# Patient Record
Sex: Female | Born: 1960 | Race: White | Hispanic: No | Smoking: Current every day smoker
Health system: Southern US, Community
[De-identification: ages and names within clinical notes are randomized; demographics above are authoritative.]

## PROBLEM LIST (undated history)

## (undated) DIAGNOSIS — R609 Edema, unspecified: Secondary | ICD-10-CM

## (undated) DIAGNOSIS — R0600 Dyspnea, unspecified: Secondary | ICD-10-CM

## (undated) DIAGNOSIS — M1990 Unspecified osteoarthritis, unspecified site: Secondary | ICD-10-CM

## (undated) DIAGNOSIS — J449 Chronic obstructive pulmonary disease, unspecified: Secondary | ICD-10-CM

## (undated) DIAGNOSIS — K219 Gastro-esophageal reflux disease without esophagitis: Secondary | ICD-10-CM

## (undated) DIAGNOSIS — F32A Depression, unspecified: Secondary | ICD-10-CM

## (undated) DIAGNOSIS — R600 Localized edema: Secondary | ICD-10-CM

## (undated) DIAGNOSIS — F419 Anxiety disorder, unspecified: Secondary | ICD-10-CM

## (undated) DIAGNOSIS — I10 Essential (primary) hypertension: Secondary | ICD-10-CM

## (undated) HISTORY — PX: ABDOMINAL HYSTERECTOMY: SHX81

## (undated) HISTORY — PX: CARPAL TUNNEL RELEASE: SHX101

## (undated) HISTORY — PX: SHOULDER SURGERY: SHX246

---
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
## 1984-08-04 HISTORY — PX: ABDOMINAL HYSTERECTOMY: SHX81

## 2019-11-03 ENCOUNTER — Other Ambulatory Visit (HOSPITAL_COMMUNITY): Payer: Self-pay

## 2019-11-03 ENCOUNTER — Other Ambulatory Visit: Payer: Self-pay

## 2019-11-03 ENCOUNTER — Inpatient Hospital Stay (HOSPITAL_COMMUNITY): Payer: BLUE CROSS/BLUE SHIELD

## 2019-11-03 ENCOUNTER — Encounter (HOSPITAL_COMMUNITY): Payer: Self-pay | Admitting: Emergency Medicine

## 2019-11-03 ENCOUNTER — Inpatient Hospital Stay (HOSPITAL_COMMUNITY)
Admission: EM | Admit: 2019-11-03 | Discharge: 2019-11-07 | DRG: 433 | Disposition: A | Discharge status: Home / Self Care | Payer: BLUE CROSS/BLUE SHIELD | Attending: Internal Medicine | Admitting: Internal Medicine

## 2019-11-03 ENCOUNTER — Emergency Department (HOSPITAL_COMMUNITY): Payer: BLUE CROSS/BLUE SHIELD

## 2019-11-03 DIAGNOSIS — K7030 Alcoholic cirrhosis of liver without ascites: Secondary | ICD-10-CM

## 2019-11-03 DIAGNOSIS — J449 Chronic obstructive pulmonary disease, unspecified: Secondary | ICD-10-CM | POA: Diagnosis present

## 2019-11-03 DIAGNOSIS — Z20822 Contact with and (suspected) exposure to covid-19: Secondary | ICD-10-CM | POA: Diagnosis present

## 2019-11-03 DIAGNOSIS — Z833 Family history of diabetes mellitus: Secondary | ICD-10-CM | POA: Diagnosis not present

## 2019-11-03 DIAGNOSIS — B159 Hepatitis A without hepatic coma: Secondary | ICD-10-CM | POA: Diagnosis present

## 2019-11-03 DIAGNOSIS — R0602 Shortness of breath: Secondary | ICD-10-CM | POA: Diagnosis present

## 2019-11-03 DIAGNOSIS — F17210 Nicotine dependence, cigarettes, uncomplicated: Secondary | ICD-10-CM | POA: Diagnosis not present

## 2019-11-03 DIAGNOSIS — I8510 Secondary esophageal varices without bleeding: Secondary | ICD-10-CM | POA: Diagnosis present

## 2019-11-03 DIAGNOSIS — Z7289 Other problems related to lifestyle: Secondary | ICD-10-CM | POA: Diagnosis not present

## 2019-11-03 DIAGNOSIS — K7010 Alcoholic hepatitis without ascites: Secondary | ICD-10-CM | POA: Diagnosis present

## 2019-11-03 DIAGNOSIS — A084 Viral intestinal infection, unspecified: Secondary | ICD-10-CM | POA: Diagnosis present

## 2019-11-03 DIAGNOSIS — K5900 Constipation, unspecified: Secondary | ICD-10-CM | POA: Diagnosis not present

## 2019-11-03 DIAGNOSIS — L8990 Pressure ulcer of unspecified site, unspecified stage: Secondary | ICD-10-CM | POA: Insufficient documentation

## 2019-11-03 DIAGNOSIS — R7401 Elevation of levels of liver transaminase levels: Secondary | ICD-10-CM

## 2019-11-03 DIAGNOSIS — Z8049 Family history of malignant neoplasm of other genital organs: Secondary | ICD-10-CM

## 2019-11-03 DIAGNOSIS — Z803 Family history of malignant neoplasm of breast: Secondary | ICD-10-CM

## 2019-11-03 DIAGNOSIS — E8770 Fluid overload, unspecified: Secondary | ICD-10-CM | POA: Diagnosis present

## 2019-11-03 DIAGNOSIS — L97429 Non-pressure chronic ulcer of left heel and midfoot with unspecified severity: Secondary | ICD-10-CM | POA: Diagnosis present

## 2019-11-03 DIAGNOSIS — Z79899 Other long term (current) drug therapy: Secondary | ICD-10-CM | POA: Diagnosis not present

## 2019-11-03 DIAGNOSIS — D539 Nutritional anemia, unspecified: Secondary | ICD-10-CM | POA: Diagnosis present

## 2019-11-03 DIAGNOSIS — K7031 Alcoholic cirrhosis of liver with ascites: Secondary | ICD-10-CM | POA: Diagnosis not present

## 2019-11-03 DIAGNOSIS — Z87891 Personal history of nicotine dependence: Secondary | ICD-10-CM

## 2019-11-03 DIAGNOSIS — L989 Disorder of the skin and subcutaneous tissue, unspecified: Secondary | ICD-10-CM | POA: Diagnosis not present

## 2019-11-03 DIAGNOSIS — K7460 Unspecified cirrhosis of liver: Secondary | ICD-10-CM

## 2019-11-03 DIAGNOSIS — I10 Essential (primary) hypertension: Secondary | ICD-10-CM | POA: Diagnosis present

## 2019-11-03 DIAGNOSIS — Z7951 Long term (current) use of inhaled steroids: Secondary | ICD-10-CM

## 2019-11-03 HISTORY — DX: Essential (primary) hypertension: I10

## 2019-11-03 HISTORY — DX: Chronic obstructive pulmonary disease, unspecified: J44.9

## 2019-11-03 LAB — BASIC METABOLIC PANEL
Anion gap: 11 (ref 5–15)
BUN: <5 mg/dL — ABNORMAL LOW (ref 6–20)
CO2: 27 mmol/L (ref 22–32)
Calcium: 8.4 mg/dL — ABNORMAL LOW (ref 8.9–10.3)
Chloride: 103 mmol/L (ref 98–111)
Creatinine, Ser: 0.4 mg/dL — ABNORMAL LOW (ref 0.44–1.00)
GFR calc Af Amer: >60 mL/min (ref >60)
GFR calc non Af Amer: >60 mL/min (ref >60)
Glucose, Bld: 96 mg/dL (ref 70–99)
Potassium: 4.1 mmol/L (ref 3.5–5.1)
Sodium: 141 mmol/L (ref 135–145)

## 2019-11-03 LAB — HEPATIC FUNCTION PANEL
ALT: 29 U/L (ref 0–44)
AST: 63 U/L — ABNORMAL HIGH (ref 15–41)
Albumin: 2.4 g/dL — ABNORMAL LOW (ref 3.5–5.0)
Alkaline Phosphatase: 245 U/L — ABNORMAL HIGH (ref 38–126)
Bilirubin, Direct: 0.4 mg/dL — ABNORMAL HIGH (ref 0.0–0.2)
Indirect Bilirubin: 0.8 mg/dL (ref 0.3–0.9)
Total Bilirubin: 1.2 mg/dL (ref 0.3–1.2)
Total Protein: 5.6 g/dL — ABNORMAL LOW (ref 6.5–8.1)

## 2019-11-03 LAB — CBC
HCT: 36.1 % (ref 36.0–46.0)
Hemoglobin: 11.3 g/dL — ABNORMAL LOW (ref 12.0–15.0)
MCH: 32.6 pg (ref 26.0–34.0)
MCHC: 31.3 g/dL (ref 30.0–36.0)
MCV: 104 fL — ABNORMAL HIGH (ref 80.0–100.0)
Platelets: 413 10*3/uL — ABNORMAL HIGH (ref 150–400)
RBC: 3.47 MIL/uL — ABNORMAL LOW (ref 3.87–5.11)
RDW: 15.8 % — ABNORMAL HIGH (ref 11.5–15.5)
WBC: 9.1 10*3/uL (ref 4.0–10.5)
nRBC: 0 % (ref 0.0–0.2)

## 2019-11-03 LAB — MAGNESIUM: Magnesium: 1.8 mg/dL (ref 1.7–2.4)

## 2019-11-03 LAB — BRAIN NATRIURETIC PEPTIDE: B Natriuretic Peptide: 356.2 pg/mL — ABNORMAL HIGH (ref 0.0–100.0)

## 2019-11-03 LAB — URINALYSIS, COMPLETE (UACMP) WITH MICROSCOPIC
Bacteria, UA: NONE SEEN
Bilirubin Urine: NEGATIVE
Glucose, UA: NEGATIVE mg/dL
Hgb urine dipstick: NEGATIVE
Ketones, ur: NEGATIVE mg/dL
Leukocytes,Ua: NEGATIVE
Nitrite: NEGATIVE
Protein, ur: NEGATIVE mg/dL
Specific Gravity, Urine: 1.011 (ref 1.005–1.030)
pH: 9 — ABNORMAL HIGH (ref 5.0–8.0)

## 2019-11-03 LAB — HIV ANTIBODY (ROUTINE TESTING W REFLEX): HIV Screen 4th Generation wRfx: NONREACTIVE

## 2019-11-03 LAB — HEPATITIS B SURFACE ANTIGEN: Hepatitis B Surface Ag: NONREACTIVE

## 2019-11-03 LAB — VITAMIN B12: Vitamin B-12: 807 pg/mL (ref 180–914)

## 2019-11-03 LAB — PROTIME-INR
INR: 1.1 (ref 0.8–1.2)
Prothrombin Time: 13.6 seconds (ref 11.4–15.2)

## 2019-11-03 LAB — ETHANOL: Alcohol, Ethyl (B): <10 mg/dL (ref <10)

## 2019-11-03 LAB — SARS CORONAVIRUS 2 (TAT 6-24 HRS): SARS Coronavirus 2: NEGATIVE

## 2019-11-03 IMAGING — US US ABDOMEN LIMITED
1 series · 14 of 25 positions shown · non-contrast
Comparison: None.

CLINICAL DATA: Cirrhosis

EXAM:
ULTRASOUND ABDOMEN LIMITED RIGHT UPPER QUADRANT

[Series 1: us abdomen limited ruq · 14 of 30 slices shown]
[im 1/30]
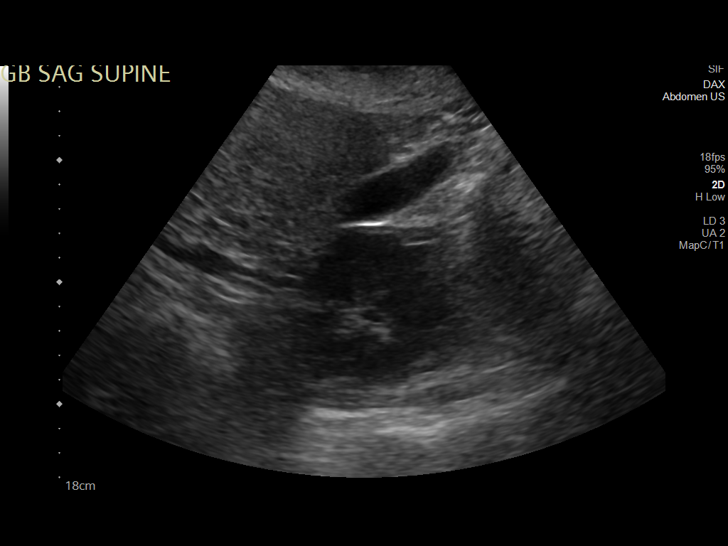
[im 3/30]
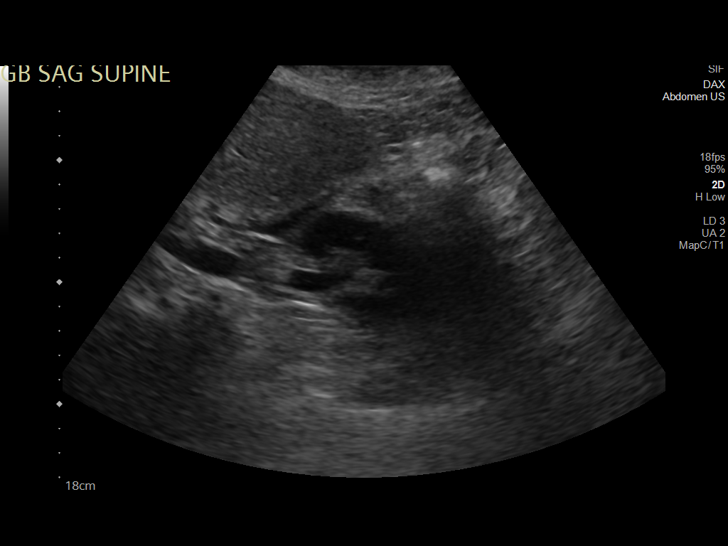
[im 5/30]
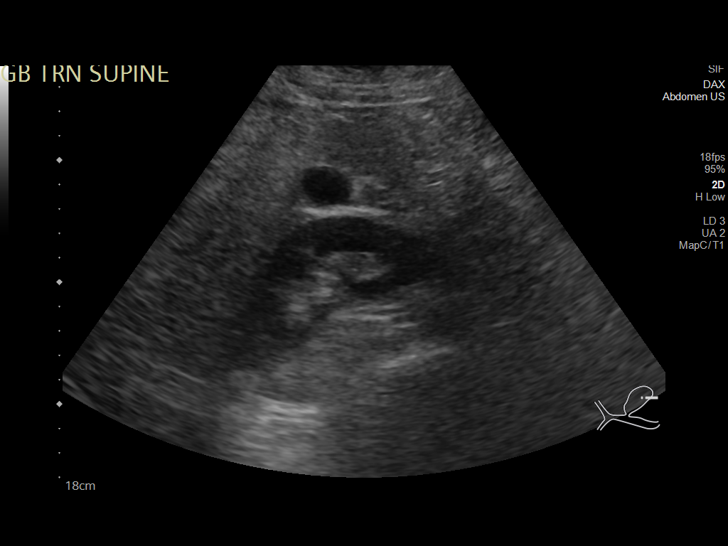
[im 8/30]
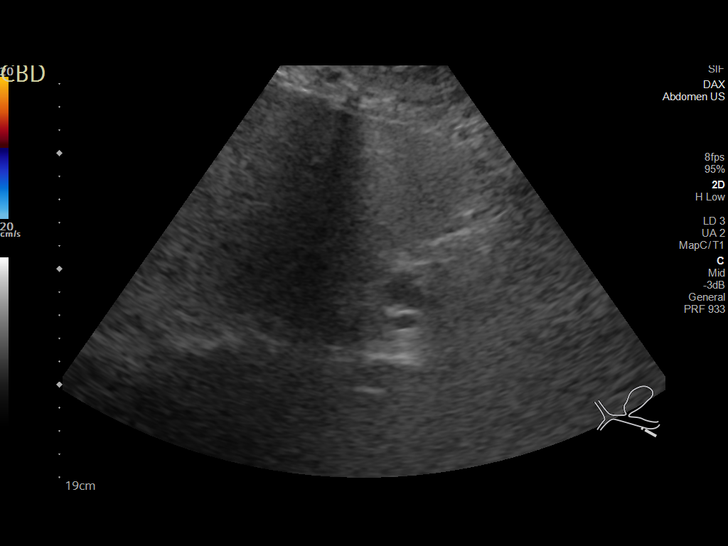
[im 10/30]
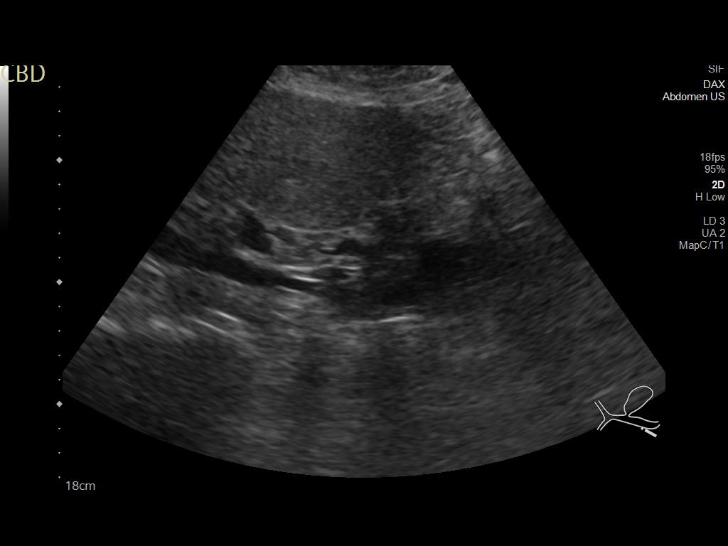
[im 11/30]
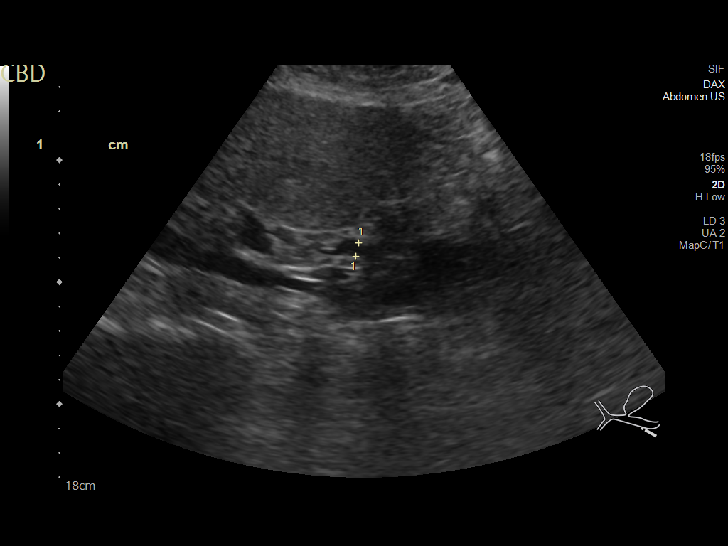
[im 14/30]
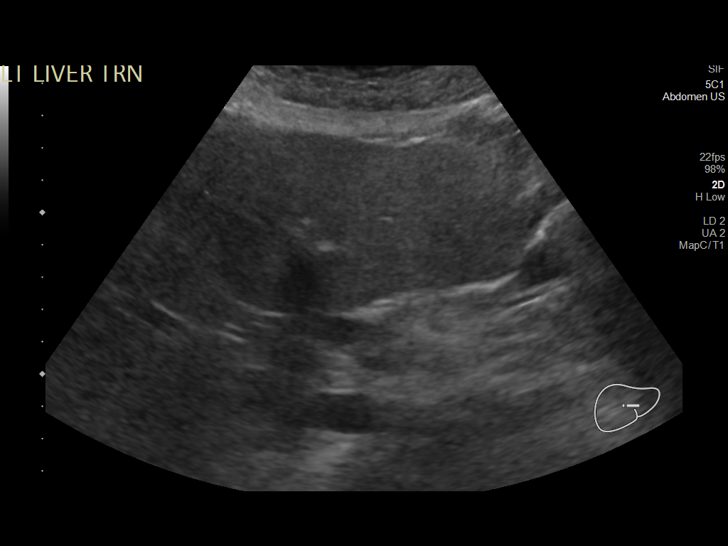
[im 16/30]
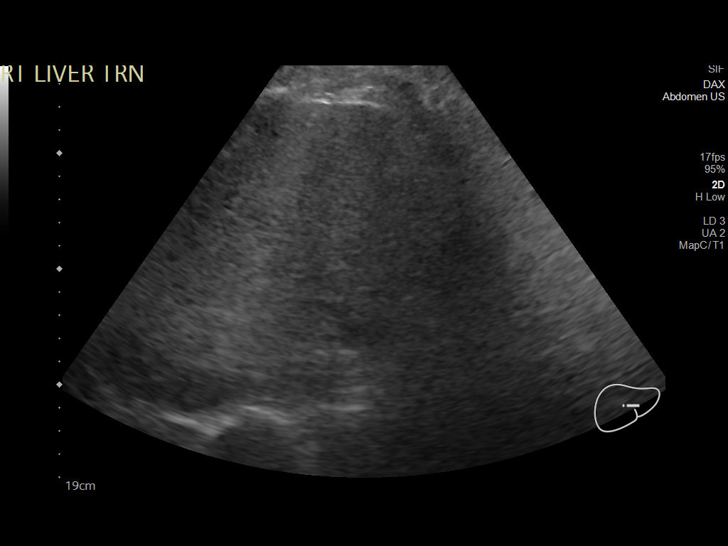
[im 19/30]
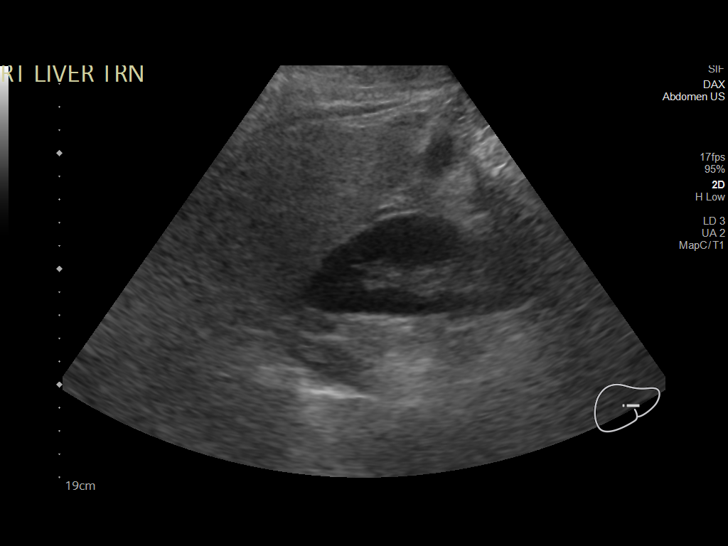
[im 20/30]
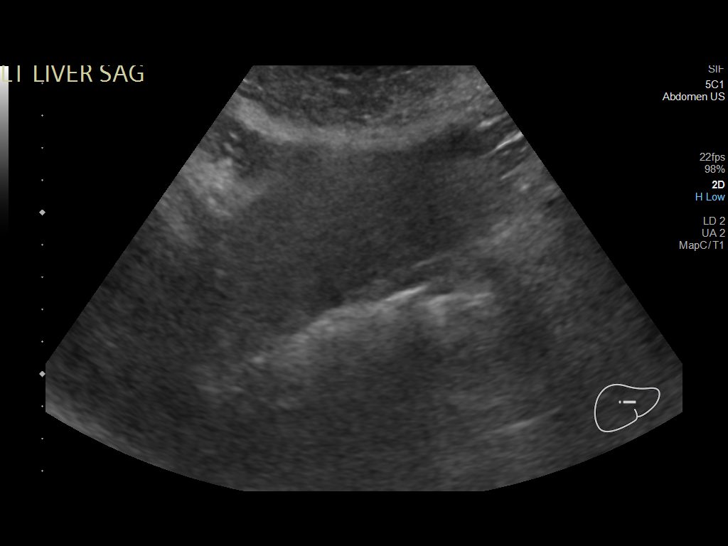
[im 22/30]
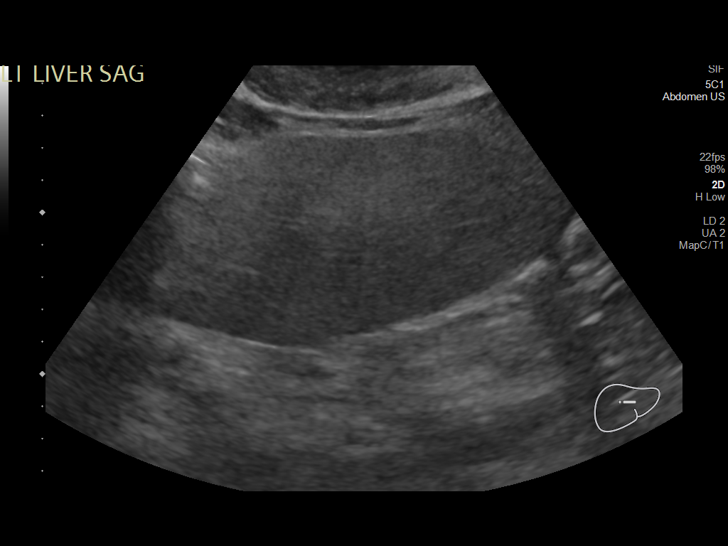
[im 25/30]
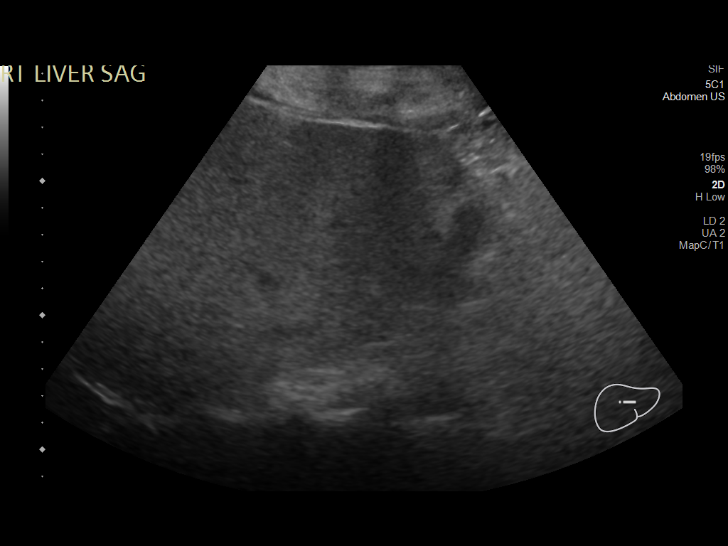
[im 27/30]
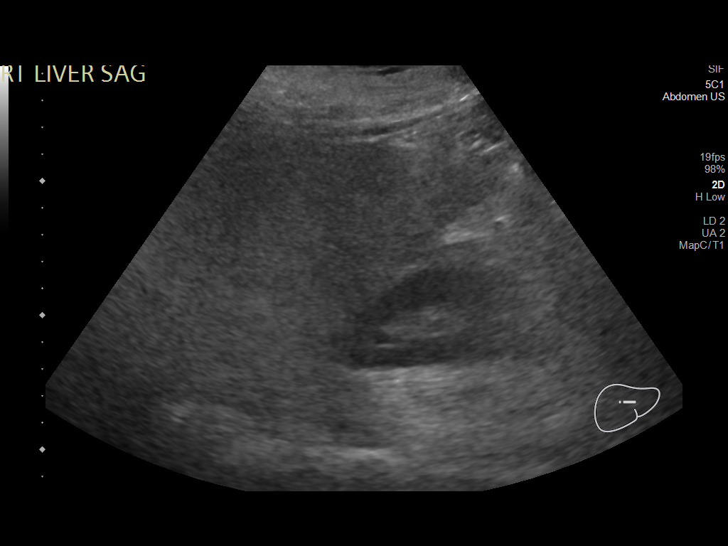
[im 30/30]
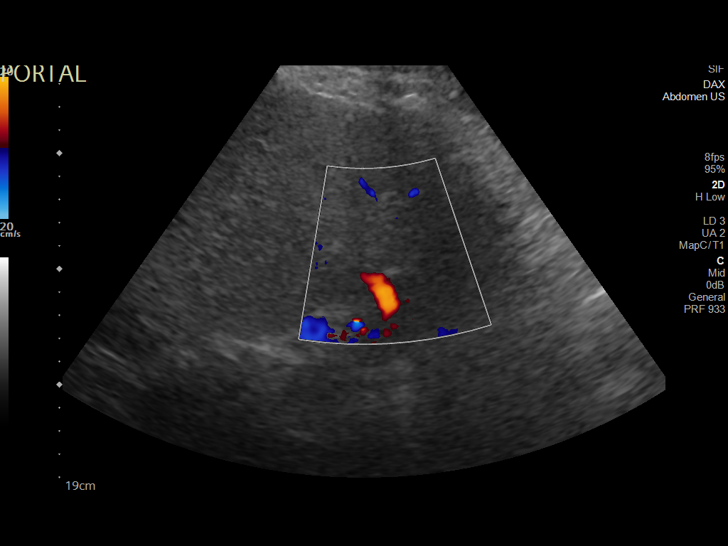

[14 of 25 positions shown; findings below may reference images not displayed]

FINDINGS: Gallbladder:

No gallstones, gallbladder wall thickening, or pericholecystic
fluid. Negative sonographic Murphy's sign.

Common bile duct:

Diameter: 6 mm

Liver:

Nodular hepatic contour with coarse hepatic echotexture, suggesting
cirrhosis. Hyperechoic hepatic parenchyma, raising the possibility
of superimposed hepatic steatosis. No focal hepatic lesion is seen.
Portal vein is patent on color Doppler imaging with normal direction
of blood flow towards the liver.

Other: None.
IMPRESSION: Cirrhosis with possible superimposed hepatic steatosis. No focal
hepatic lesion is seen.

## 2019-11-03 IMAGING — DX DG CHEST 1V PORT
1 series · 1 of 1 positions shown · non-contrast
Comparison: None.

CLINICAL DATA: Dyspnea on exertion.

EXAM:
PORTABLE CHEST 1 VIEW

[chest ap]
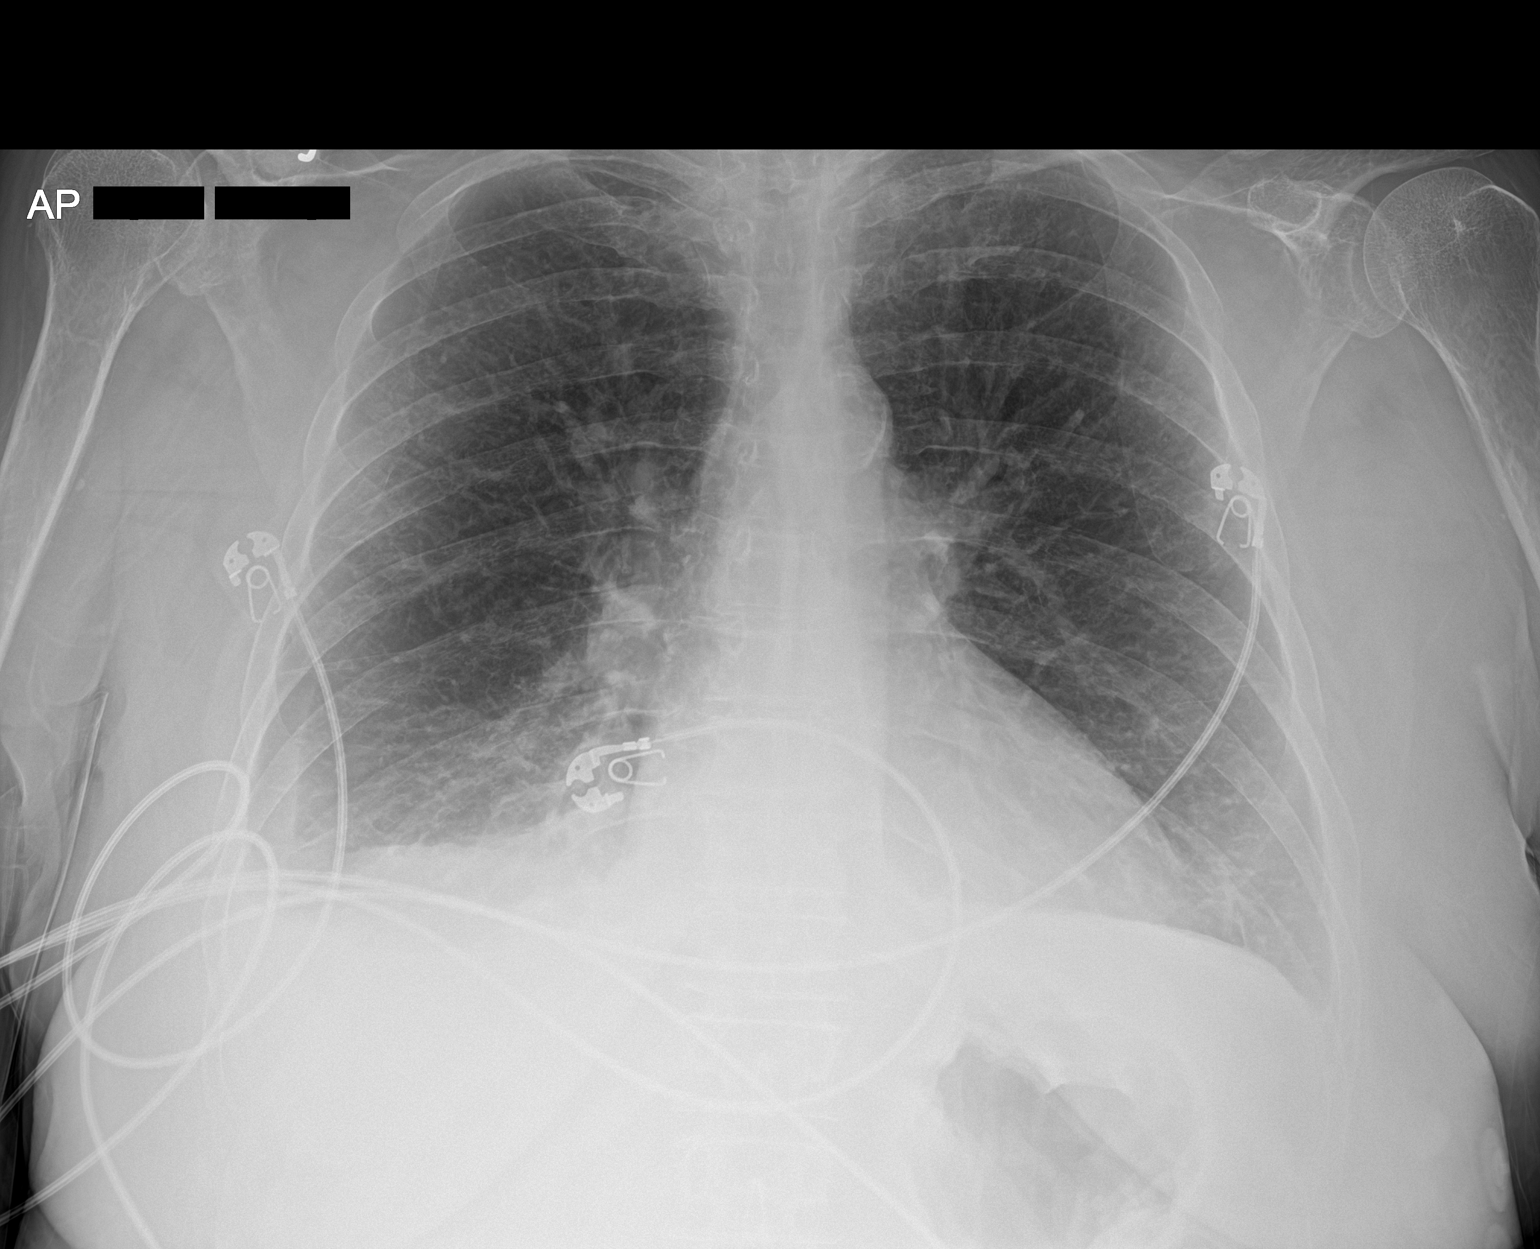

[1 of 1 positions shown; findings below may reference images not displayed]

FINDINGS: The heart size is borderline enlarged. Aortic calcifications are
noted. There are small bilateral pleural effusions, right greater
than left. There are bibasilar airspace opacities which are favored
to represent areas of atelectasis, however infiltrate is not
excluded. There is no pneumothorax. No acute osseous abnormality.
IMPRESSION: 1. Small bilateral pleural effusions, right greater than left.
Bibasilar airspace opacities, favored to represent atelectasis,
however infiltrate is not excluded.
2. Borderline cardiomegaly.

## 2019-11-03 MED ORDER — ONDANSETRON HCL 4 MG/2ML IJ SOLN
4.0000 mg | Freq: Four times a day (QID) | INTRAMUSCULAR | Status: DC | PRN
  Administered 2019-11-06: 4 mg via INTRAVENOUS
  Filled 2019-11-03: qty 2

## 2019-11-03 MED ORDER — PANTOPRAZOLE SODIUM 40 MG PO TBEC
40.0000 mg | DELAYED_RELEASE_TABLET | Freq: Every day | ORAL | Status: DC
  Administered 2019-11-03 – 2019-11-07 (×5): 40 mg via ORAL
  Filled 2019-11-03 (×5): qty 1

## 2019-11-03 MED ORDER — IBUPROFEN 400 MG PO TABS: 400.0000 mg | ORAL_TABLET | Freq: Four times a day (QID) | ORAL | Status: DC | PRN

## 2019-11-03 MED ORDER — IBUPROFEN 200 MG PO TABS
200.0000 mg | ORAL_TABLET | Freq: Four times a day (QID) | ORAL | Status: DC | PRN
  Administered 2019-11-04 – 2019-11-05 (×4): 200 mg via ORAL
  Filled 2019-11-03 (×4): qty 1

## 2019-11-03 MED ORDER — ADULT MULTIVITAMIN W/MINERALS CH
1.0000 | ORAL_TABLET | Freq: Every day | ORAL | Status: DC
  Administered 2019-11-03 – 2019-11-07 (×5): 1 via ORAL
  Filled 2019-11-03 (×5): qty 1

## 2019-11-03 MED ORDER — MAGNESIUM SULFATE 2 GM/50ML IV SOLN
2.0000 g | Freq: Once | INTRAVENOUS | Status: AC
  Administered 2019-11-03: 2 g via INTRAVENOUS
  Filled 2019-11-03: qty 50

## 2019-11-03 MED ORDER — FUROSEMIDE 10 MG/ML IJ SOLN: 80.0000 mg | Freq: Once | INTRAMUSCULAR | Status: DC

## 2019-11-03 MED ORDER — THIAMINE HCL 100 MG PO TABS
100.0000 mg | ORAL_TABLET | Freq: Every day | ORAL | Status: DC
  Administered 2019-11-03 – 2019-11-07 (×5): 100 mg via ORAL
  Filled 2019-11-03 (×5): qty 1

## 2019-11-03 MED ORDER — ENOXAPARIN SODIUM 40 MG/0.4ML ~~LOC~~ SOLN
40.0000 mg | SUBCUTANEOUS | Status: DC
  Administered 2019-11-03 – 2019-11-06 (×4): 40 mg via SUBCUTANEOUS
  Filled 2019-11-03 (×4): qty 0.4

## 2019-11-03 MED ORDER — ONDANSETRON HCL 4 MG PO TABS: 4.0000 mg | ORAL_TABLET | Freq: Four times a day (QID) | ORAL | Status: DC | PRN

## 2019-11-03 MED ORDER — FUROSEMIDE 10 MG/ML IJ SOLN
40.0000 mg | Freq: Once | INTRAMUSCULAR | Status: AC
  Administered 2019-11-03: 40 mg via INTRAVENOUS
  Filled 2019-11-03: qty 4

## 2019-11-03 MED ORDER — IPRATROPIUM-ALBUTEROL 0.5-2.5 (3) MG/3ML IN SOLN
3.0000 mL | Freq: Four times a day (QID) | RESPIRATORY_TRACT | Status: DC | PRN
  Administered 2019-11-07: 3 mL via RESPIRATORY_TRACT
  Filled 2019-11-03: qty 3

## 2019-11-03 MED ORDER — FOLIC ACID 1 MG PO TABS
1.0000 mg | ORAL_TABLET | Freq: Every day | ORAL | Status: DC
  Administered 2019-11-03 – 2019-11-07 (×5): 1 mg via ORAL
  Filled 2019-11-03 (×5): qty 1

## 2019-11-03 NOTE — ED Provider Notes (Signed)
[facility] EMERGENCY DEPARTMENT Provider Note   CSN: [id number] Arrival date & time: 11/03/19  1435     History Chief Complaint  Patient presents with  . abdominal swelling  . Leg Swelling  . Shortness of Breath    [name] is a 59 y.o. female.  HPI Patient is a 59 year old female with a PMH of COPD and alcoholic hepatitis presenting to the ED today due to worsening leg swelling and shortness of breath.  Patient reports that she had been in and out of the hospital since January due to alcohol withdrawal, DTs and alcohol cirrhosis.  She was recently discharged from an [facility] 2 weeks ago, per patient.  She says that since January, she has experienced worsening dyspnea on exertion.  She is also complaining of bilateral lower extremity swelling that has significantly worsened over the past 2 to 3 days.  She has experienced vomiting and diarrhea during the past 2 nights.  She endorses dry cough but denies fever, chills, abdominal pain, chest pain, congestion, rhinorrhea or sore throat.  Patient says that she has completely stopped consuming alcohol.  Patient is accompanied by family who denies any changes in her mental status.    Past Medical History:  Diagnosis Date  . COPD (chronic obstructive pulmonary disease) ([place])   . Hypertension     There are no problems to display for this patient.   Past Surgical History:  Procedure Laterality Date  . ABDOMINAL HYSTERECTOMY       OB History   No obstetric history on file.     No family history on file.  Social History   Tobacco Use  . Smoking status: Not on file  Substance Use Topics  . Alcohol use: Not on file  . Drug use: Not on file    Home Medications Prior to Admission medications   Not on File    Allergies    Patient has no known allergies.  Review of Systems   Review of Systems  Constitutional: Negative for chills and fever.  HENT: Negative for rhinorrhea and sore throat.   Eyes:  Negative for photophobia and visual disturbance.  Respiratory: Positive for shortness of breath. Negative for cough.   Cardiovascular: Positive for leg swelling. Negative for chest pain and palpitations.  Gastrointestinal: Positive for diarrhea, nausea and vomiting. Negative for abdominal pain.  Genitourinary: Negative for dysuria and hematuria.  Musculoskeletal: Negative for arthralgias and back pain.  Skin: Positive for wound. Negative for rash.  Neurological: Negative for weakness and headaches.  Psychiatric/Behavioral: Negative for agitation.  All other systems reviewed and are negative.   Physical Exam Updated Vital Signs BP (!) 179/90   Pulse (!) 115   Temp 99.8 F (37.7 C) (Oral)   Resp 20   Ht 5' 2"  (1.575 m)   Wt 72.6 kg   SpO2 96%   BMI 29.26 kg/m   Physical Exam Vitals and nursing note reviewed.  Constitutional:      General: She is not in acute distress.    Appearance: Normal appearance. She is well-developed. She is ill-appearing (chronically).  HENT:     Head: Normocephalic and atraumatic.     Right Ear: External ear normal.     Left Ear: External ear normal.     Nose: Nose normal. No congestion or rhinorrhea.     Mouth/Throat:     Mouth: Mucous membranes are moist.     Pharynx: Oropharynx is clear.  Eyes:     General:  No scleral icterus.    Extraocular Movements: Extraocular movements intact.     Pupils: Pupils are equal, round, and reactive to light.  Cardiovascular:     Rate and Rhythm: Regular rhythm. Tachycardia present.     Pulses: Normal pulses.     Heart sounds: Normal heart sounds.  Pulmonary:     Effort: No respiratory distress.     Breath sounds: Rales present.     Comments: Increased work of breathing with minimal exertion. Abdominal:     General: There is distension.     Palpations: Abdomen is soft.     Tenderness: There is no abdominal tenderness.  Musculoskeletal:        General: Normal range of motion.     Cervical back: Normal  range of motion and neck supple.     Right lower leg: Edema present.     Left lower leg: Edema present.     Comments: Bilateral pitting edema that extends above the knees.  Skin:    General: Skin is warm and dry.     Capillary Refill: Capillary refill takes less than 2 seconds.     Comments: Pressure ulcer on left heel with no significant surrounding erythema, swelling or drainage.  Chronic venous stasis changes on bilateral shins.  Neurological:     General: No focal deficit present.     Mental Status: She is alert and oriented to person, place, and time. Mental status is at baseline.     Cranial Nerves: No cranial nerve deficit.  Psychiatric:        Mood and Affect: Mood normal.     ED Results / Procedures / Treatments   Labs (all labs ordered are listed, but only abnormal results are displayed) Labs Reviewed  BASIC METABOLIC PANEL - Abnormal; Notable for the following components:      Result Value   BUN <5 (*)    Creatinine, Ser 0.40 (*)    Calcium 8.4 (*)    All other components within normal limits  CBC - Abnormal; Notable for the following components:   RBC 3.47 (*)    Hemoglobin 11.3 (*)    MCV 104.0 (*)    RDW 15.8 (*)    Platelets 413 (*)    All other components within normal limits  HEPATIC FUNCTION PANEL - Abnormal; Notable for the following components:   Total Protein 5.6 (*)    Albumin 2.4 (*)    AST 63 (*)    Alkaline Phosphatase 245 (*)    Bilirubin, Direct 0.4 (*)    All other components within normal limits  BRAIN NATRIURETIC PEPTIDE - Abnormal; Notable for the following components:   B Natriuretic Peptide 356.2 (*)    All other components within normal limits  URINALYSIS, COMPLETE (UACMP) WITH MICROSCOPIC - Abnormal; Notable for the following components:   pH 9.0 (*)    All other components within normal limits  SARS CORONAVIRUS 2 (TAT 6-24 HRS)  GI PATHOGEN PANEL BY PCR, STOOL  C DIFFICILE QUICK SCREEN W PCR REFLEX  MAGNESIUM  VITAMIN B12   PROTIME-INR  HIV ANTIBODY (ROUTINE TESTING W REFLEX)  ETHANOL  HEPATITIS B SURFACE ANTIGEN  PROTIME-INR  COMPREHENSIVE METABOLIC PANEL  CBC  HCV AB W REFLEX TO QUANT PCR  HEPATITIS B SURFACE ANTIBODY, QUANTITATIVE  HEPATITIS A ANTIBODY, TOTAL  HEMOGLOBIN A1C    EKG EKG Interpretation  Date/Time:  Thursday November 03 2019 14:40:39 EDT Ventricular Rate:  114 PR Interval:  136 QRS Duration: 64 QT  Interval:  324 QTC Calculation: 446 R Axis:   73 Text Interpretation: Sinus tachycardia Otherwise normal ECG No prior ECG for comparison. NO STEMI Confirmed by Antony Blackbird [phone]) on 11/03/2019 3:40:47 PM   Radiology No results found.  Procedures Procedures (including critical care time)  Medications Ordered in ED Medications  enoxaparin (LOVENOX) injection 40 mg (40 mg Subcutaneous Given 11/03/19 2141)  ondansetron (ZOFRAN) tablet 4 mg (has no administration in time range)    Or  ondansetron (ZOFRAN) injection 4 mg (has no administration in time range)  folic acid (FOLVITE) tablet 1 mg (1 mg Oral Given 11/03/19 2142)  multivitamin with minerals tablet 1 tablet (1 tablet Oral Given 11/03/19 2140)  thiamine tablet 100 mg (100 mg Oral Given 11/03/19 2149)  ipratropium-albuterol (DUONEB) 0.5-2.5 (3) MG/3ML nebulizer solution 3 mL (has no administration in time range)  ibuprofen (ADVIL) tablet 200 mg (has no administration in time range)  pantoprazole (PROTONIX) EC tablet 40 mg (40 mg Oral Given 11/03/19 2200)  magnesium sulfate IVPB 2 g 50 mL (2 g Intravenous New Bag/Given 11/03/19 2149)  furosemide (LASIX) injection 40 mg (40 mg Intravenous Given 11/03/19 2141)    ED Course  I have reviewed the triage vital signs and the nursing notes.  Pertinent labs & imaging results that were available during my care of the patient were reviewed by me and considered in my medical decision making (see chart for details).    MDM Rules/Calculators/A&P                     Patient is a 59 year old female  with a PMH of COPD and alcoholic hepatitis presenting to the ED today due to worsening leg swelling and shortness of breath.  On exam, patient has bilateral lower extremity pitting edema with diffuse rales on auscultation of her lungs.BP 179/90, HR 115, RR 20, SPO2 96% on room air. Afebrile.  On arrival, patient appears chronically ill and has increased work of breathing with minimal exertion.  She reports that her abdomen is distended; however, she denies any tenderness on palpation.  She has multiple signs of volume overload including bilateral lower extremity edema and abdominal distention.  EKG shows sinus tachycardia with no signs of acute ischemia.  Chest x-ray with small bilateral pleural effusions.  CBC with hemoglobin 11.3.  CMP with total protein 5.6, albumin 2.4, AST 63, ALT 29, alk phos 245.  BNP 356.  Mag within normal limits.  Nursing walked patient and she desatted to the 80s with increased work of breathing.  Presentation and lab work concerning for hypervolemia, likely in the setting of chronic liver disease.  Little concern for SBP or hepatic encephalopathy at this time.  Patient admitted to internal medicine service.  For details on hospital course following admission, please refer to inpatient team's note.  Patient stable at time of admission.  Patient assessed and evaluated with Dr. Sherry Ruffing.  Nadeen Landau, MD   Final Clinical Impression(s) / ED Diagnoses Final diagnoses:  Cirrhosis [facility])    Rx / [place] Orders ED Discharge Orders    None       Nadeen Landau, MD 11/04/19 [id number]    Tegeler, Gwenyth Allegra, MD 11/04/19 1031

## 2019-11-03 NOTE — ED Triage Notes (Signed)
Pt here with leg and abd swelling that has been getting worse over the last 2-3 days. Pt also co some sob. Co pain in her legs, feet and abd. Pt states she has been in an out of hosp since Jan.

## 2019-11-03 NOTE — H&P (Signed)
Date: 11/03/2019               Patient Name:  [name] MRN: [id number]  DOB: Jan 26, 1961 Age / Sex: 59 y.o., female   PCP: Patient, No Pcp Per         Medical Service: Internal Medicine Teaching Service         Attending Physician: Dr. Aldine Contes, MD    First Contact: Marva Panda, MD, [place] Pager: [place] [phone])  Second Contact: Myrtie Hawk, MD, [place] Pager: EM [phone])       After Hours (After 5p/  First Contact Pager: [phone]  weekends / holidays): Second Contact Pager: [phone]   Chief Complaint: shortness of breath   History of Present Illness: [name] is a  60 y.o. female w/ PMHx  significant for decompensated alcoholic cirrhosis with history of esophageal varices and ?GI bleed, end stage COPD, and hypertension presenting with one week of nausea/vomiting, diarrhea, and shortness of breath. Patient is poor historian. Part of HPI provided by patient's cousin, [name], via telephone.  Patient has had functional decline for several years secondary to her alcohol use. She used to work as a Scientist, clinical (histocompatibility and immunogenetics) but has been unable to do so since Thanksgiving due to massive edema, ascites and liver cirrhosis and was forced to take a medical leave from work. On January 30th, patient moved from [place] to [place]. Since then, she has been admitted several times to [facility] and after her last admission, was discharged to a SNF in [place]. She was discharged from the nursing facility 3-4 weeks ago. Since then, she has noted to have worsening confusion. She also has one week of nausea/vomiting, diarrhea, abdominal pain, decreased PO intake, heart burn and shortness of breath with intermittent chest pain and worsening lower extremity edema. She notes this has acutely worsened over the past two days and notes up to 5-6 episodes of diarrhea overnight and notes that she had multiple episodes of emesis yesterday. She denies any hematemesis or hematochezia. She notes stool to be  brown/green colored. She also notes malodorous urine; however, denies any dysuria or increased urinary frequency. Patient also endorses worsening shortness of breath that is exacerbated with activity and relieved with nebulizer treatments. Patient notes that she has had worsening lower extremity edema and unsteady gait. Notes that she often loses her balance; however this appears to be a chronic issue per family.   ED course:  Patient presented with abdominal and lower extremity swelling that has been worsening over 2-3 days with shortness of breath. ED work up significant for elevated BNP 356.2, Na 141, K 4.1, Cl 103, CO2 27, BUN/Cr <5/0.40, Ca 8.4, Mag 1.8, AST/ALT/Alk phos 63/29/245, WBC 9.1, Hb/Hct 11.3/36.1, MCV 104.0, Plt 413. Patient admitted to internal medicine for further evaluation and management.   Meds:  Current Meds  Medication Sig  . acetaminophen (TYLENOL) 500 MG tablet Take 500 mg by mouth every 6 (six) hours as needed for moderate pain.  . Albuterol Sulfate 2.5 MG/0.5ML NEBU Inhale 2.5 mg into the lungs every 6 (six) hours as needed (sob and wheezing).  . budesonide-formoterol (SYMBICORT) 160-4.5 MCG/ACT inhaler Inhale 2 puffs into the lungs 2 (two) times daily.  . folic acid (FOLVITE) 1 MG tablet Take 1 mg by mouth daily.  . ondansetron (ZOFRAN-ODT) 4 MG disintegrating tablet Take 4 mg by mouth every 8 (eight) hours as needed for nausea or vomiting.  Marland Kitchen oxybutynin (DITROPAN-XL) 10 MG 24 hr tablet Take 10 mg by mouth  at bedtime.  . potassium chloride SA (KLOR-CON) 20 MEQ tablet Take 20 mEq by mouth daily.  . QUEtiapine (SEROQUEL) 50 MG tablet Take 50 mg by mouth at bedtime.  . simethicone (MYLICON) 357 MG chewable tablet Chew 125 mg by mouth every 6 (six) hours as needed for flatulence.     Allergies: Allergies as of 11/03/2019  . (No Known Allergies)   Past Medical History:  Diagnosis Date  . COPD (chronic obstructive pulmonary disease) ([place])   . Hypertension      Family History:  Breast cancer and cervical cancer in mother Diabetes mellitus type 2 in mother  Social History:  Social History   Tobacco Use  . Smoking status: Not on file  Substance Use Topics  . Alcohol use: Not on file  . Drug use: Not on file   Patient is from [place] and has history of two failed marriages; she notes that she started drinking heavily following her last divorce ~7 years ago and would drink up to 6 beers per day. She notes that she stopped drinking in January 2021. She also notes significant smoking history for >25 years of 1ppd but notes that she has recently cut down. She denies any history of illicit drug use.   Review of Systems: A complete ROS was negative except as per HPI.   Physical Exam: Blood pressure (!) 154/64, pulse (!) 111, temperature 99.8 F (37.7 C), temperature source Oral, resp. rate (!) 22, height 5' 2"  (1.575 m), weight 72.6 kg, SpO2 93 %. Physical Exam Constitutional:      General: She is not in acute distress.    Appearance: She is well-developed and normal weight. She is not toxic-appearing or diaphoretic.  HENT:     Head: Normocephalic and atraumatic.     Mouth/Throat:     Mouth: Mucous membranes are moist.     Pharynx: Oropharynx is clear. No pharyngeal swelling or oropharyngeal exudate.  Eyes:     Extraocular Movements: Extraocular movements intact.     Pupils: Pupils are equal, round, and reactive to light.  Neck:     Vascular: No hepatojugular reflux or JVD.  Cardiovascular:     Rate and Rhythm: Regular rhythm. Tachycardia present.     Pulses: Normal pulses.     Heart sounds: Normal heart sounds. No murmur. No friction rub. No gallop.   Pulmonary:     Effort: Pulmonary effort is normal. No tachypnea, accessory muscle usage or respiratory distress.     Breath sounds: Rales present.  Chest:     Chest wall: No tenderness.  Abdominal:     General: Bowel sounds are normal.     Palpations: Abdomen is soft.      Tenderness: There is abdominal tenderness. There is no guarding.  Musculoskeletal:        General: Normal range of motion.     Cervical back: Normal range of motion and neck supple.     Right lower leg: Tenderness present. Edema present.     Left lower leg: Tenderness present. Edema present.  Skin:    General: Skin is warm and dry.     Capillary Refill: Capillary refill takes less than 2 seconds.     Findings: Erythema present.     Comments: See below   Neurological:     General: No focal deficit present.     Mental Status: She is alert and oriented to person, place, and time.     Cranial Nerves: No cranial nerve deficit.  Motor: No weakness.  Psychiatric:        Mood and Affect: Mood normal.        Behavior: Behavior normal.       CBC Latest Ref Rng & Units 11/03/2019  WBC 4.0 - 10.5 K/uL 9.1  Hemoglobin 12.0 - 15.0 g/dL 11.3(L)  Hematocrit 36.0 - 46.0 % 36.1  Platelets 150 - 400 K/uL 413(H)   CMP Latest Ref Rng & Units 11/03/2019  Glucose 70 - 99 mg/dL 96  BUN 6 - 20 mg/dL <5(L)  Creatinine 0.44 - 1.00 mg/dL 0.40(L)  Sodium 135 - 145 mmol/L 141  Potassium 3.5 - 5.1 mmol/L 4.1  Chloride 98 - 111 mmol/L 103  CO2 22 - 32 mmol/L 27  Calcium 8.9 - 10.3 mg/dL 8.4(L)  Total Protein 6.5 - 8.1 g/dL 5.6(L)  Total Bilirubin 0.3 - 1.2 mg/dL 1.2  Alkaline Phos 38 - 126 U/L 245(H)  AST 15 - 41 U/L 63(H)  ALT 0 - 44 U/L 29   EKG: personally reviewed my interpretation is sinus tachycardia, QTc 446  CXR: personally reviewed my interpretation is pulmonary vascular congestion, small bilateral pleural effusions, mild cardiomegaly.   Assessment & Plan by Problem: Active Problems:   Hypervolemia  [name] is a 59 yr old female with history of decompensated liver cirrhosis with esophageal varices secondary to chronic alcohol use, end stage COPD, and hypertension presenting with one week duration of worsening lower extremity edema, nausea/vomiting, abdominal pain, diarrhea and  shortness of breath.   Hypervolemia in setting of decompensated liver cirrhosis with possible esophageal varices:  Transaminitis:  Patient noted to have worsening lower extremity edema and shortness of breath for one week duration. She has a history of decompensated liver cirrhosis with esophageal varices and possible GI bleed. She notes that she is unable to lie flat without getting short of breath and notes orthopnea of 2 pillows. Per family member, noted to have worsening confusion recently and also worsening balance with frequent falls. Patient noted to have significant pitting edema of bilateral lower extremities to the knees with JVD to mid jawline. Also noted to have diffuse crackles on auscultation. BNP elevated to 300. She is also noted to have elevated LFTs. Suspect that her hypervolemia is secondary to worsening of her cirrhosis.  - RUQ [place] - IV Lasix 55m once - Strict I&O - LFT monitoring daily - PT/INR  - Hepatitis panel  - PT/OT evaluation  Viral gastritis: Patient notes one week history of nausea/vomiting, abdominal pain, decreased PO intake, and diarrhea. She denies any hematemesis or hematochezia or change in stool color. Patient was seen at outpatient clinic and treated with imodium and Maalox but reports that symptoms are persistent. Per family member, concerns for possible protozoa in stool with outpatient work up. Also notes history of antibiotic use, with last use about 3 weeks ago; however unclear regarding reason for antibiotics. Patient is afebrile and without leukocytosis. Abdominal examination is benign.  - C.diff PCR - GI panel  - Monitor and replete electrolyte as needed  Macrocytic anemia: Hb 11.3/Hct 36.1 with MCV 104. Suspect this is in setting of liver cirrhosis.  - Vitamin B12 level - Folate level  Hx of alcohol use:  Patient with significant history of alcohol use with drinking about 6 beers per day for at least 7 years. She notes that she stopped drinking  in January. However, per family member, unclear if patient has actually stopped drinking and unclear when her last drink was. ETOH <10.  -  CIWA w/o Ativan - Folic acid 69m daily - Multivitamin 1 tablet daily  - Thiamine 1056mdaily  End stage COPD:  Patient notes that she takes albuterol nebulizer at home for her COPD. On examination, patient has diffuse crackles on lung auscultation without any wheezing noted.  - Duonebs 63m25m6h prn - Pulse ox monitoring  RLE wound: Left heel ulceration:  Patient with RLE wound since January 2021 and left heel ulceration of unknown duration.  - WOC consult - Continue to monitor  FEN/GI: Diet: HH/Carb modified Electrolytes: Monitor and replete prn Fluids: 1200m1muid restriction  DVT Prophylaxis: Lovenox Code: FULL  Dispo: Admit patient to Inpatient with expected length of stay greater than 2 midnights.  Signed: AslaHarvie Heck  Internal Medicine, PGY-1 11/03/2019, 7:33 PM  Pager: [phone]

## 2019-11-04 ENCOUNTER — Other Ambulatory Visit (HOSPITAL_COMMUNITY): Payer: Self-pay

## 2019-11-04 ENCOUNTER — Encounter (HOSPITAL_COMMUNITY): Payer: Self-pay | Admitting: Internal Medicine

## 2019-11-04 DIAGNOSIS — R7401 Elevation of levels of liver transaminase levels: Secondary | ICD-10-CM

## 2019-11-04 DIAGNOSIS — L8990 Pressure ulcer of unspecified site, unspecified stage: Secondary | ICD-10-CM | POA: Insufficient documentation

## 2019-11-04 DIAGNOSIS — A084 Viral intestinal infection, unspecified: Secondary | ICD-10-CM

## 2019-11-04 DIAGNOSIS — D539 Nutritional anemia, unspecified: Secondary | ICD-10-CM

## 2019-11-04 DIAGNOSIS — J449 Chronic obstructive pulmonary disease, unspecified: Secondary | ICD-10-CM

## 2019-11-04 DIAGNOSIS — Z79899 Other long term (current) drug therapy: Secondary | ICD-10-CM

## 2019-11-04 DIAGNOSIS — B159 Hepatitis A without hepatic coma: Secondary | ICD-10-CM

## 2019-11-04 DIAGNOSIS — K7030 Alcoholic cirrhosis of liver without ascites: Secondary | ICD-10-CM

## 2019-11-04 DIAGNOSIS — E8770 Fluid overload, unspecified: Secondary | ICD-10-CM

## 2019-11-04 DIAGNOSIS — I8510 Secondary esophageal varices without bleeding: Secondary | ICD-10-CM

## 2019-11-04 DIAGNOSIS — I10 Essential (primary) hypertension: Secondary | ICD-10-CM

## 2019-11-04 LAB — COMPREHENSIVE METABOLIC PANEL
ALT: 27 U/L (ref 0–44)
AST: 51 U/L — ABNORMAL HIGH (ref 15–41)
Albumin: 2.4 g/dL — ABNORMAL LOW (ref 3.5–5.0)
Alkaline Phosphatase: 239 U/L — ABNORMAL HIGH (ref 38–126)
Anion gap: 13 (ref 5–15)
BUN: <5 mg/dL — ABNORMAL LOW (ref 6–20)
CO2: 28 mmol/L (ref 22–32)
Calcium: 8.6 mg/dL — ABNORMAL LOW (ref 8.9–10.3)
Chloride: 98 mmol/L (ref 98–111)
Creatinine, Ser: 0.54 mg/dL (ref 0.44–1.00)
GFR calc Af Amer: >60 mL/min (ref >60)
GFR calc non Af Amer: >60 mL/min (ref >60)
Glucose, Bld: 94 mg/dL (ref 70–99)
Potassium: 3.4 mmol/L — ABNORMAL LOW (ref 3.5–5.1)
Sodium: 139 mmol/L (ref 135–145)
Total Bilirubin: 1.1 mg/dL (ref 0.3–1.2)
Total Protein: 5.5 g/dL — ABNORMAL LOW (ref 6.5–8.1)

## 2019-11-04 LAB — HEMOGLOBIN A1C
Hgb A1c MFr Bld: 5.1 % (ref 4.8–5.6)
Mean Plasma Glucose: 99.67 mg/dL

## 2019-11-04 LAB — GLUCOSE, CAPILLARY
Glucose-Capillary: 99 mg/dL (ref 70–99)
Glucose-Capillary: 117 mg/dL — ABNORMAL HIGH (ref 70–99)
Glucose-Capillary: 112 mg/dL — ABNORMAL HIGH (ref 70–99)

## 2019-11-04 LAB — CBC
HCT: 36.2 % (ref 36.0–46.0)
Hemoglobin: 11.5 g/dL — ABNORMAL LOW (ref 12.0–15.0)
MCH: 32.9 pg (ref 26.0–34.0)
MCHC: 31.8 g/dL (ref 30.0–36.0)
MCV: 103.4 fL — ABNORMAL HIGH (ref 80.0–100.0)
Platelets: 442 10*3/uL — ABNORMAL HIGH (ref 150–400)
RBC: 3.5 MIL/uL — ABNORMAL LOW (ref 3.87–5.11)
RDW: 15.9 % — ABNORMAL HIGH (ref 11.5–15.5)
WBC: 8.4 10*3/uL (ref 4.0–10.5)
nRBC: 0 % (ref 0.0–0.2)

## 2019-11-04 LAB — PROTIME-INR
INR: 1.1 (ref 0.8–1.2)
Prothrombin Time: 14.1 seconds (ref 11.4–15.2)

## 2019-11-04 LAB — HEPATITIS A ANTIBODY, TOTAL: hep A Total Ab: REACTIVE — AB

## 2019-11-04 LAB — HEPATITIS A ANTIBODY, IGM: Hep A IgM: NONREACTIVE

## 2019-11-04 MED ORDER — FUROSEMIDE 10 MG/ML IJ SOLN
40.0000 mg | Freq: Once | INTRAMUSCULAR | Status: AC
  Administered 2019-11-04: 40 mg via INTRAVENOUS
  Filled 2019-11-04: qty 4

## 2019-11-04 MED ORDER — QUETIAPINE FUMARATE 50 MG PO TABS
50.0000 mg | ORAL_TABLET | Freq: Every day | ORAL | Status: DC
  Administered 2019-11-04 – 2019-11-06 (×3): 50 mg via ORAL
  Filled 2019-11-04 (×3): qty 1

## 2019-11-04 MED ORDER — FOLIC ACID 1 MG PO TABS: 1.0000 mg | ORAL_TABLET | Freq: Every day | ORAL | Status: DC

## 2019-11-04 MED ORDER — OXYBUTYNIN CHLORIDE ER 10 MG PO TB24
10.0000 mg | ORAL_TABLET | Freq: Every day | ORAL | Status: DC
  Administered 2019-11-04 – 2019-11-06 (×3): 10 mg via ORAL
  Filled 2019-11-04 (×4): qty 1

## 2019-11-04 MED ORDER — MOMETASONE FURO-FORMOTEROL FUM 200-5 MCG/ACT IN AERO
2.0000 | INHALATION_SPRAY | Freq: Two times a day (BID) | RESPIRATORY_TRACT | Status: DC
  Administered 2019-11-04 – 2019-11-06 (×6): 2 via RESPIRATORY_TRACT
  Filled 2019-11-04: qty 8.8

## 2019-11-04 MED ORDER — SPIRONOLACTONE 25 MG PO TABS
100.0000 mg | ORAL_TABLET | Freq: Every day | ORAL | Status: DC
  Administered 2019-11-04 – 2019-11-07 (×4): 100 mg via ORAL
  Filled 2019-11-04 (×4): qty 4

## 2019-11-04 NOTE — Evaluation (Signed)
Physical Therapy Evaluation Patient Details Name: [name] MRN: [id number] DOB: 1960/08/29 Today's Date: 11/04/2019   History of Present Illness  Pt is a 59 y/o female with PMH of  decompensated alcoholic cirrhosis with history of esophageal varices, GI bleed, end stage COPD, and hypertension presenting with one week of nausea/vomiting, SOB, LB edema, abdominal pain, and worsening confusion.   Clinical Impression   Pt admitted with above diagnosis. PTA was living home with mother or aunt. Has just returned from SNF in past 2-3 weeks. States was quite independent, there are ADs available to pt but she does not currently use them. Reports some falls at home, prior to last hospitalization and from pt description seem to be from clumsiness or accidental and not correlated to dizziness or legs giving out. Pt currently with functional limitations due to the deficits listed below (see PT Problem List). This pm pt is moving very well able to complete all tasks given with SBA- mod I and ambulated in room with RW x 43ft with SBA. Pt was on room air and sats remained >96% w/ max HR noted 114bpm. Pt able to participate in assessment and has been educated on importance of continuing with HEP (even while in hospital) to prevent decline in function but also to help with moving of fluid. Pt will benefit from skilled PT to increase their independence and safety with mobility to allow discharge to the venue listed below.       Follow Up Recommendations No PT follow up    Equipment Recommendations  None recommended by PT    Recommendations for Other Services       Precautions / Restrictions Precautions Precautions: Fall Restrictions Weight Bearing Restrictions: No      Mobility  Bed Mobility Overal bed mobility: Modified Independent                Transfers Overall transfer level: Needs assistance Equipment used: Rolling walker (2 wheeled) Transfers: Sit to/from [employer] Sit to Stand: Supervision Stand pivot transfers: Supervision          Ambulation/Gait Ambulation/Gait assistance: Min guard Gait Distance (Feet): 90 Feet Assistive device: Rolling walker (2 wheeled) Gait Pattern/deviations: Step-through pattern Gait velocity: dec   General Gait Details: ambulated in room approx 80ft w/ RW and SBA max HR 114bpm min 02 96% on room air  Stairs            Wheelchair Mobility    Modified Rankin (Stroke Patients Only)       Balance Overall balance assessment: Needs assistance Sitting-balance support: Feet supported Sitting balance-Leahy Scale: Good     Standing balance support: During functional activity;Bilateral upper extremity supported Standing balance-Leahy Scale: Fair                               Pertinent Vitals/Pain Pain Assessment: 0-10 Pain Score: 2  Pain Location: BLE after ambulation states having tingling Pain Descriptors / Indicators: Tingling    Home Living Family/patient expects to be discharged to:: Private residence Living Arrangements: Parent Available Help at Discharge: Family;Available 24 hours/day Type of Home: House Home Access: Ramped entrance     Home Layout: One level Home Equipment: Shower seat;Bedside commode      Prior Function Level of Independence: Needs assistance   Gait / Transfers Assistance Needed: indepedent mobility, reports furniture/wall walking around home   ADL's / Homemaking Assistance Needed: independent ADLs, some assist at times with  shower         Hand Dominance   Dominant Hand: Right    Extremity/Trunk Assessment   Upper Extremity Assessment Upper Extremity Assessment: Overall WFL for tasks assessed    Lower Extremity Assessment Lower Extremity Assessment: Generalized weakness    Cervical / Trunk Assessment Cervical / Trunk Assessment: Normal  Communication   Communication: No difficulties  Cognition Arousal/Alertness:  Awake/alert Behavior During Therapy: WFL for tasks assessed/performed Overall Cognitive Status: Within Functional Limits for tasks assessed                                        General Comments      Exercises     Assessment/Plan    PT Assessment Patient needs continued PT services  PT Problem List Decreased strength;Decreased activity tolerance;Decreased balance;Decreased mobility;Decreased coordination;Decreased knowledge of use of DME;Decreased safety awareness       PT Treatment Interventions DME instruction;Gait training;Functional mobility training;Therapeutic activities;Therapeutic exercise;Balance training;Neuromuscular re-education;Patient/family education    PT Goals (Current goals can be found in the Care Plan section)  Acute Rehab PT Goals Patient Stated Goal: to get back home  PT Goal Formulation: With patient/family Time For Goal Achievement: 11/18/19 Potential to Achieve Goals: Good    Frequency Min 3X/week   Barriers to discharge        Co-evaluation               AM-PAC PT "6 Clicks" Mobility  Outcome Measure Help needed turning from your back to your side while in a flat bed without using bedrails?: None Help needed moving from lying on your back to sitting on the side of a flat bed without using bedrails?: None Help needed moving to and from a bed to a chair (including a wheelchair)?: A Little Help needed standing up from a chair using your arms (e.g., wheelchair or bedside chair)?: A Little Help needed to walk in hospital room?: A Little Help needed climbing 3-5 steps with a railing? : A Little 6 Click Score: 20    End of Session Equipment Utilized During Treatment: Gait belt Activity Tolerance: Patient limited by fatigue;Patient limited by lethargy;Patient limited by pain Patient left: in bed;with call bell/phone within reach;with family/visitor present Nurse Communication: Mobility status PT Visit Diagnosis: Other  abnormalities of gait and mobility (R26.89);Muscle weakness (generalized) (M62.81)    Time: 9485-4627 PT Time Calculation (min) (ACUTE ONLY): 23 min   Charges:   PT Evaluation $PT Eval Moderate Complexity: 1 Mod PT Treatments $Gait Training: 8-22 mins        Drema Pry, PT   Freddi Starr 11/04/2019, 2:42 PM

## 2019-11-04 NOTE — Progress Notes (Signed)
Subjective: HD#1 Overnight, no acute events reported.  This morning, patient evaluated at bedside. She notes feeling well this morning with improvement in lower extremity swelling and breathing. She is able to tolerate food and denies any nausea/vomiting. She has not had a bowel movement yet.   Objective:  Vital signs in last 24 hours: Vitals:   11/03/19 2030 11/03/19 2110 11/04/19 0025 11/04/19 0500  BP: (!) 149/84 (!) 158/80 (!) 123/53 (!) 154/76  Pulse:  (!) 108 (!) 107 (!) 108  Resp: (!) 24 20 (!) 22 (!) 30  Temp:  98.9 F (37.2 C) 99.3 F (37.4 C) 99.6 F (37.6 C)  TempSrc:  Oral Oral Oral  SpO2:  95% 96% 96%  Weight:  81.4 kg  78.8 kg  Height:  5\' 2"  (1.575 m)     CBC Latest Ref Rng & Units 11/04/2019 11/03/2019  WBC 4.0 - 10.5 K/uL 8.4 9.1  Hemoglobin 12.0 - 15.0 g/dL 11.5(L) 11.3(L)  Hematocrit 36.0 - 46.0 % 36.2 36.1  Platelets 150 - 400 K/uL 442(H) 413(H)   CMP Latest Ref Rng & Units 11/04/2019 11/03/2019  Glucose 70 - 99 mg/dL 94 96  BUN 6 - 20 mg/dL <5(L) <5(L)  Creatinine 0.44 - 1.00 mg/dL 0.54 0.40(L)  Sodium 135 - 145 mmol/L 139 141  Potassium 3.5 - 5.1 mmol/L 3.4(L) 4.1  Chloride 98 - 111 mmol/L 98 103  CO2 22 - 32 mmol/L 28 27  Calcium 8.9 - 10.3 mg/dL 8.6(L) 8.4(L)  Total Protein 6.5 - 8.1 g/dL 5.5(L) 5.6(L)  Total Bilirubin 0.3 - 1.2 mg/dL 1.1 1.2  Alkaline Phos 38 - 126 U/L 239(H) 245(H)  AST 15 - 41 U/L 51(H) 63(H)  ALT 0 - 44 U/L 27 29   RUQ [place] 11/04/2019: IMPRESSION: Cirrhosis with possible superimposed hepatic steatosis. No focal hepatic lesion is seen.   Physical exam:  Physical Exam  Constitutional: She is oriented to person, place, and time and well-developed, well-nourished, and in no distress.  HENT:  Head: Normocephalic and atraumatic.  Cardiovascular: Normal rate, regular rhythm, normal heart sounds and intact distal pulses. Exam reveals no friction rub.  No murmur heard. Pulmonary/Chest: Effort normal. No respiratory distress. She has  rales.  Diffuse crackles in bilateral lung fields   Abdominal: Soft. Bowel sounds are normal. She exhibits no distension. There is abdominal tenderness. There is no rebound and no guarding.  Musculoskeletal:        General: Edema present. Normal range of motion.     Comments: 2+ pitting edema of bilateral lower extremities to bilateral shins, mildly improved from yesterday  Neurological: She is alert and oriented to person, place, and time.  Skin: Skin is warm and dry. She is not diaphoretic. There is erythema.  Erythema of bilateral lower extremities consistent with chronic venous stasis changes  LLE wound - pink without signs of infection RLE heel ulceration - clean without signs of infection   [facility]: Zofran odt 4mg  tid prn K 73mEq 1 daily Seroquel 50mg  qHS Folic acid 1 mg qd Albuterol sulfate neb 0.83, 1 vial via neb q6h Furosemide 40mg  for 3 days  HCTZ 12.5mg  tablet once daily and prn   Assessment/Plan:  Ms. Lawsyn Heiler is a 59 yr old female with history of decompensated liver cirrhosis with esophageal varices secondary to chronic alcohol use, end stage COPD, and hypertension presenting with one week duration of worsening lower extremity edema, nausea/vomiting, abdominal pain, diarrhea and shortness of breath.   Hypervolemia: Hx of decompensated cirrhosis:  Patient admitted with lower extremity edema and dyspnea on exertion for which she was given one dose of lasix 40mg  overnight with ~1.5L UOP. On examination, lower extremity edema improving and diffuse crackles also improved from yesterday. Patient is on room air and saturating well without any tachypnea noted. Although she has a history of decompensated cirrhosis, Child pugh class B and MELD score 8. RUQ without evidence of ascites. Does not appear that patient is on spirinolactone or beta blocker for her decompensated cirrhosis with ?esophageal varices. Does have transaminitis but synthetic function of liver is not  significantly impaired.  - IV Lasix 40mg  today - Spirinolactone 100mg  daily - Strict I&O - Echo to evaluate for any cardiac cause of hypervolemia - CMP daily  Viral gastritis: Hepatitis A:  Patient presented with one week history of nausea/vomiting and diarrhea that acutely worsened over past few days. Patient afebrile and without leukocytosis. She has not had further episodes of nausea/vomiting or diarrhea since being admitted. Did note to be hepatitis A total Ab positive.  - F/u GI panel and C.diff PCR - Continue supportive care  Macrocytic anemia:  Hb/Hct 11.5/36.2 with MCV 103.4. Vitamin B12 wnl. Suspect this is in setting of chronic liver disease. - F/u folate level - CBC daily  FEN/GI:  Diet: HH/Carb modified Electrolytes: Monitor and replete prn Fluids: fluid restriction  DVT Prophylaxis: Lovenox Code status: FULL   Prior to Admission Living Arrangement: Home Anticipated Discharge Location: SNF pending PT/OT evaluation Barriers to Discharge: Ongoing medical evaluation and treatment   Dispo: Anticipated discharge pending clinical improvement.   , MD  Internal Medicine,PGY-1 11/04/2019, 6:49 AM Pager: [phone]

## 2019-11-04 NOTE — Consult Note (Signed)
WOC Nurse Consult Note: Patient receiving care in [facility] 6E30.  Consult completed remotely after review of record and photos of wounds. Reason for Consult: "wound care" Wound type: partial thickness wound to RLE, small unstageable PI to left heel Pressure Injury POA: Yes Measurement: To be provided by the bedside RN in the flowsheet section Wound bed: see images Drainage (amount, consistency, odor)  Periwound: intact Dressing procedure/placement/frequency:  Place a xeroform gauze [place] [phone]) to right leg wound. Cover with ABD pad. Beginning behind the toes and going just below the knee, spiral wrap kerlex, the 4 inch ace wrap.  Change daily. For the left heel, apply iodine, allow to air dry, then spiral wrap kerlex and 4 inch ace wrap. Change daily. I have also added bilateral heel lift boots for pressure relief. Monitor the wound area(s) for worsening of condition such as: Signs/symptoms of infection,  Increase in size,  Development of or worsening of odor, Development of pain, or increased pain at the affected locations.  Notify the medical team if any of these develop.  Thank you for the consult.  Discussed plan of care with the patient and bedside nurse.  WOC nurse will not follow at this time.  Please re-consult the WOC team if needed.  Helmut Muster, RN, MSN, CWOCN, CNS-BC, pager [phone]

## 2019-11-04 NOTE — Evaluation (Signed)
Occupational Therapy Evaluation Patient Details Name: [name] MRN: [id number] DOB: [birth year] Today's Date: 11/04/2019    History of Present Illness Pt is a 59 y/o female with PMH of  decompensated alcoholic cirrhosis with history of esophageal varices, GI bleed, end stage COPD, and hypertension presenting with one week of nausea/vomiting, SOB, LB edema, abdominal pain, and worsening confusion.    Clinical Impression   PTA patient independent with ADLs, mobility; reports recently returning home from rehab.  Admitted for above and is limited by problem list below, including B LE pain/edema, impaired balance, and decreased activity tolerance.  She complete transfers and in room mobility with min guard for safety, LB ADls with min guard and UB ADls with setup assist.  She will benefit from further OT services while admitted to optimize independence and safety with ADLs, IADls and mobility but anticipate no further needs after dc home.     Follow Up Recommendations  No OT follow up;Supervision/Assistance - 24 hour    Equipment Recommendations  None recommended by OT    Recommendations for Other Services       Precautions / Restrictions Precautions Precautions: Fall(mod) Restrictions Weight Bearing Restrictions: No      Mobility Bed Mobility Overal bed mobility: Modified Independent             General bed mobility comments: no assist required  Transfers Overall transfer level: Needs assistance Equipment used: None Transfers: Sit to/from Stand Sit to Stand: Min guard         General transfer comment: min guard for safety/balance, no LOB    Balance Overall balance assessment: Needs assistance Sitting-balance support: No upper extremity supported;Feet supported Sitting balance-Leahy Scale: Good     Standing balance support: During functional activity;No upper extremity supported Standing balance-Leahy Scale: Fair Standing balance comment: preference to 1 UE  support during mobility, reaching out for support at times                            ADL either performed or assessed with clinical judgement   ADL Overall ADL's : Needs assistance/impaired     Grooming: Min guard;Standing   Upper Body Bathing: Sitting;Supervision/ safety   Lower Body Bathing: Min guard;Sit to/from stand   Upper Body Dressing : Supervision/safety;Set up;Sitting   Lower Body Dressing: Min guard;Sit to/from stand   Toilet Transfer: Min guard;BSC;Ambulation   Toileting- Architect and Hygiene: Sit to/from stand;Min guard       Functional mobility during ADLs: Min guard General ADL Comments: pt limited by LB edema/pain, decreased activity tolerance and impaired balance      Vision Baseline Vision/History: Wears glasses Wears Glasses: At all times Patient Visual Report: No change from baseline Vision Assessment?: No apparent visual deficits     Perception     Praxis      Pertinent Vitals/Pain Pain Assessment: 0-10 Pain Score: 5  Pain Location: B LEs  Pain Descriptors / Indicators: Tingling     Hand Dominance Right   Extremity/Trunk Assessment Upper Extremity Assessment Upper Extremity Assessment: Overall WFL for tasks assessed   Lower Extremity Assessment Lower Extremity Assessment: Defer to PT evaluation       Communication Communication Communication: No difficulties   Cognition Arousal/Alertness: Awake/alert Behavior During Therapy: WFL for tasks assessed/performed Overall Cognitive Status: Within Functional Limits for tasks assessed  General Comments: appears WFL during session    General Comments  B LE edema and wounds reports from fall in tub, educated on elevation and floating heels      Exercises     Shoulder Instructions      Home Living Family/patient expects to be discharged to:: Private residence Living Arrangements: Parent(in mid 70s--mom and dads   ) Available Help at Discharge: Family;Available 24 hours/day Type of Home: House Home Access: Ramped entrance     Home Layout: One level     Bathroom Shower/Tub: Teacher, early years/pre: Standard     Home Equipment: Shower seat;Bedside commode   Additional Comments: reports having walkers and canes (from her parents, but they aren't using them)      Prior Functioning/Environment Level of Independence: Needs assistance  Gait / Transfers Assistance Needed: indepedent mobility, reports furniture/wall walking around home  ADL's / Homemaking Assistance Needed: independent ADLs, some assist at times with shower    Comments: - driving, no IADLs, managing meds          OT Problem List: Decreased activity tolerance;Impaired balance (sitting and/or standing);Decreased safety awareness;Decreased knowledge of use of DME or AE;Decreased knowledge of precautions;Pain      OT Treatment/Interventions: Self-care/ADL training;Energy conservation;DME and/or AE instruction;Therapeutic activities;Patient/family education;Balance training    OT Goals(Current goals can be found in the care plan section) Acute Rehab OT Goals Patient Stated Goal: to get back home  OT Goal Formulation: With patient Time For Goal Achievement: 11/18/19 Potential to Achieve Goals: Good  OT Frequency: Min 2X/week   Barriers to D/C:            Co-evaluation              AM-PAC OT "6 Clicks" Daily Activity     Outcome Measure Help from another person eating meals?: None Help from another person taking care of personal grooming?: A Little Help from another person toileting, which includes using toliet, bedpan, or urinal?: A Little Help from another person bathing (including washing, rinsing, drying)?: A Little Help from another person to put on and taking off regular upper body clothing?: A Little Help from another person to put on and taking off regular lower body clothing?: A Little 6 Click  Score: 19   End of Session Nurse Communication: Mobility status;Precautions  Activity Tolerance: Patient tolerated treatment well Patient left: in chair;with call bell/phone within reach  OT Visit Diagnosis: Other abnormalities of gait and mobility (R26.89);Muscle weakness (generalized) (M62.81);Pain Pain - Right/Left: (bil) Pain - part of body: Ankle and joints of foot;Leg                Time: 6269-4854 OT Time Calculation (min): 21 min Charges:  OT General Charges $OT Visit: 1 Visit OT Evaluation $OT Eval Moderate Complexity: 1 Mod  Jolaine Artist, OT Acute Rehabilitation Services Pager [phone] Office [phone]   Delight Stare 11/04/2019, 9:16 AM

## 2019-11-05 ENCOUNTER — Inpatient Hospital Stay (HOSPITAL_COMMUNITY): Payer: BLUE CROSS/BLUE SHIELD

## 2019-11-05 DIAGNOSIS — R0602 Shortness of breath: Secondary | ICD-10-CM

## 2019-11-05 DIAGNOSIS — K5900 Constipation, unspecified: Secondary | ICD-10-CM

## 2019-11-05 LAB — PROTIME-INR
INR: 1.1 (ref 0.8–1.2)
Prothrombin Time: 14 seconds (ref 11.4–15.2)

## 2019-11-05 LAB — ECHOCARDIOGRAM COMPLETE
Height: 62 in
Weight: 2756.8 oz

## 2019-11-05 LAB — COMPREHENSIVE METABOLIC PANEL
ALT: 20 U/L (ref 0–44)
AST: 39 U/L (ref 15–41)
Albumin: 2.1 g/dL — ABNORMAL LOW (ref 3.5–5.0)
Alkaline Phosphatase: 192 U/L — ABNORMAL HIGH (ref 38–126)
Anion gap: 11 (ref 5–15)
BUN: 5 mg/dL — ABNORMAL LOW (ref 6–20)
CO2: 28 mmol/L (ref 22–32)
Calcium: 8.5 mg/dL — ABNORMAL LOW (ref 8.9–10.3)
Chloride: 103 mmol/L (ref 98–111)
Creatinine, Ser: 0.53 mg/dL (ref 0.44–1.00)
GFR calc Af Amer: >60 mL/min (ref >60)
GFR calc non Af Amer: >60 mL/min (ref >60)
Glucose, Bld: 109 mg/dL — ABNORMAL HIGH (ref 70–99)
Potassium: 3.6 mmol/L (ref 3.5–5.1)
Sodium: 142 mmol/L (ref 135–145)
Total Bilirubin: 0.8 mg/dL (ref 0.3–1.2)
Total Protein: 5.1 g/dL — ABNORMAL LOW (ref 6.5–8.1)

## 2019-11-05 LAB — CBC
HCT: 31.7 % — ABNORMAL LOW (ref 36.0–46.0)
Hemoglobin: 10.1 g/dL — ABNORMAL LOW (ref 12.0–15.0)
MCH: 32.1 pg (ref 26.0–34.0)
MCHC: 31.9 g/dL (ref 30.0–36.0)
MCV: 100.6 fL — ABNORMAL HIGH (ref 80.0–100.0)
Platelets: 378 10*3/uL (ref 150–400)
RBC: 3.15 MIL/uL — ABNORMAL LOW (ref 3.87–5.11)
RDW: 15.6 % — ABNORMAL HIGH (ref 11.5–15.5)
WBC: 8.7 10*3/uL (ref 4.0–10.5)
nRBC: 0 % (ref 0.0–0.2)

## 2019-11-05 LAB — HCV INTERPRETATION

## 2019-11-05 LAB — HEPATITIS B SURFACE ANTIBODY, QUANTITATIVE: Hep B S AB Quant (Post): <3.1 m[IU]/mL — ABNORMAL LOW (ref >9.9)

## 2019-11-05 LAB — HCV AB W REFLEX TO QUANT PCR: HCV Ab: <0.1 s/co ratio (ref 0.0–0.9)

## 2019-11-05 MED ORDER — RAMELTEON 8 MG PO TABS
8.0000 mg | ORAL_TABLET | Freq: Every evening | ORAL | Status: DC | PRN
  Filled 2019-11-05: qty 1

## 2019-11-05 MED ORDER — OXYCODONE HCL 5 MG PO TABS
5.0000 mg | ORAL_TABLET | Freq: Once | ORAL | Status: AC | PRN
  Administered 2019-11-06: 5 mg via ORAL
  Filled 2019-11-05: qty 1

## 2019-11-05 MED ORDER — FUROSEMIDE 10 MG/ML IJ SOLN
40.0000 mg | Freq: Once | INTRAMUSCULAR | Status: AC
  Administered 2019-11-05: 40 mg via INTRAVENOUS
  Filled 2019-11-05: qty 4

## 2019-11-05 MED ORDER — POLYETHYLENE GLYCOL 3350 17 G PO PACK
17.0000 g | PACK | Freq: Once | ORAL | Status: AC
  Administered 2019-11-05: 17 g via ORAL
  Filled 2019-11-05: qty 1

## 2019-11-05 MED ORDER — IBUPROFEN 200 MG PO TABS
200.0000 mg | ORAL_TABLET | Freq: Four times a day (QID) | ORAL | Status: DC | PRN
  Administered 2019-11-05 – 2019-11-07 (×4): 400 mg via ORAL
  Filled 2019-11-05 (×4): qty 2

## 2019-11-05 MED ORDER — POLYETHYLENE GLYCOL 3350 17 G PO PACK: 17.0000 g | PACK | Freq: Every day | ORAL | Status: DC | PRN

## 2019-11-05 NOTE — Progress Notes (Signed)
   Subjective: Patient was seen and evaluated this morning.  She is doing well but complains of bilateral leg pain. Denies any chest pain, shortness of breath.  Denies any heart racing or palpitation.  Tolerated food very well without any nausea.  She did not have any bowel movement and ask for something for constipation.  Denies any abdominal pain.  Objective:  Vital signs in last 24 hours: Vitals:   11/04/19 1316 11/04/19 1547 11/04/19 2015 11/05/19 0529  BP: 128/64 (!) 152/76 (!) 158/69 (!) 148/74  Pulse: (!) 101 (!) 107 (!) 103 (!) 110  Resp: 17 (!) 22 (!) 24 19  Temp:  98.5 F (36.9 C)  98.3 F (36.8 C)  TempSrc:  Oral  Oral  SpO2: 96% 94% 95% 94%  Weight:    78.2 kg  Height:       Physical Exam  Constitutional: Pleasant lady. No acute distress.  Cardiovascular: Slightly tachycardic, regular rhythm, nl S1S2, no murmur, 1+ lower extremity edema (lower extremities are wrapped Respiratory: Effort normal, breath sounds slightly decreased and no major crackle heard (patient is not very cooperative to take deep breath), no respiratory distress. No wheezes GI: Soft. Bowel sounds are normal. No distension. There is no tenderness.  Neurological: Is alert and oriented x 3  Skin: Lower extremity wounds are wrapped  Assessment/Plan:  Active Problems:   Hypervolemia   Pressure injury of skin  Alcoholic cirrhosis: [name] class A Fluid overload In setting of cirrhosis versus heart failure component? Still has lower extremity edema. Received IV Lasix 40 mg daily and is started on the spironolactone 100 mg p.o. yesterday. Intake output as below.  Net -763 yesterday. (Unsure if urine was collected completely.  But patient mentions that she did not pee as much as expected with diuresis yesterday).  We will continue to reassess.  Intake/Output Summary (Last 24 hours) at 11/05/2019 0918 Last data filed at 11/04/2019 2330 Gross per 24 hour  Intake 1062 ml  Output 1825 ml  Net -763 ml    I/O last 3 completed shifts: In: 1112 [P.O.:1062; IV Piggyback:50] Out: 4525 [Urine:4525] No intake/output data recorded.   -Continue IV Lasix 40 mg today -Continue spironolactone 100 mg daily -Echo ordered and is pending -Strict intake output -Weight daily -CMP daily.  INR normal.  LFT, potassium and creatinine stable today. -Fluid restriction  Diarrhea: Resolved -GI panel was negative -C. difficile test was not done as patient did not have any bowel movement since transferring to floor -Has a positive HAV total antibody but her IgM was negative.  Indicating past infection  Constipation: Patient now complains of constipation. Denies any nausea vomiting or abdominal pain abdominal exam is benign.  -MiraLAX now and as needed   Diet: Heart healthy/carb modified IV fluid: None (fluid restriction) VTE ppx: Lovenox Code status: Full  Prior to Admission Living Arrangement: Home Anticipated Discharge Location: Home Dispo: Anticipated discharge in approximately 2-3 days  Julie Pretty, MD 11/05/2019, 6:58 AM Pager: [phone]

## 2019-11-05 NOTE — Plan of Care (Signed)
  Problem: Health Behavior/Discharge Planning: Goal: Ability to manage health-related needs will improve Outcome: Progressing   

## 2019-11-05 NOTE — Progress Notes (Signed)
I saw patient after being paged about bilateral lower extremity pain. She reports pain on both legs, states that it is because her skin is dry and feels that the wrapping is too tight.  On exam: Lower extremity edema improved but still present on both ankles. Lower extremities skin is dry. Has good pulses. No evidence of active infection.   -Ok to unwrap the legs tonight. (Patient reports relief when unwrapped) -Continue LE elevation -ABI to evaluate for ischemic pain -1 dose of low dose PRN Oxy ordered for night

## 2019-11-05 NOTE — Progress Notes (Signed)
Echocardiogram 2D Echocardiogram has been performed.  [name] 11/05/2019, 9:13 AM

## 2019-11-06 ENCOUNTER — Encounter (HOSPITAL_COMMUNITY): Payer: Self-pay

## 2019-11-06 DIAGNOSIS — Z7289 Other problems related to lifestyle: Secondary | ICD-10-CM

## 2019-11-06 LAB — CBC
HCT: 29.5 % — ABNORMAL LOW (ref 36.0–46.0)
Hemoglobin: 9.5 g/dL — ABNORMAL LOW (ref 12.0–15.0)
MCH: 32.5 pg (ref 26.0–34.0)
MCHC: 32.2 g/dL (ref 30.0–36.0)
MCV: 101 fL — ABNORMAL HIGH (ref 80.0–100.0)
Platelets: 359 10*3/uL (ref 150–400)
RBC: 2.92 MIL/uL — ABNORMAL LOW (ref 3.87–5.11)
RDW: 15.4 % (ref 11.5–15.5)
WBC: 8.5 10*3/uL (ref 4.0–10.5)
nRBC: 0 % (ref 0.0–0.2)

## 2019-11-06 LAB — COMPREHENSIVE METABOLIC PANEL
ALT: 20 U/L (ref 0–44)
AST: 36 U/L (ref 15–41)
Albumin: 2 g/dL — ABNORMAL LOW (ref 3.5–5.0)
Alkaline Phosphatase: 163 U/L — ABNORMAL HIGH (ref 38–126)
Anion gap: 11 (ref 5–15)
BUN: 5 mg/dL — ABNORMAL LOW (ref 6–20)
CO2: 29 mmol/L (ref 22–32)
Calcium: 8.1 mg/dL — ABNORMAL LOW (ref 8.9–10.3)
Chloride: 101 mmol/L (ref 98–111)
Creatinine, Ser: 0.53 mg/dL (ref 0.44–1.00)
GFR calc Af Amer: >60 mL/min (ref >60)
GFR calc non Af Amer: >60 mL/min (ref >60)
Glucose, Bld: 98 mg/dL (ref 70–99)
Potassium: 3.1 mmol/L — ABNORMAL LOW (ref 3.5–5.1)
Sodium: 141 mmol/L (ref 135–145)
Total Bilirubin: 0.8 mg/dL (ref 0.3–1.2)
Total Protein: 4.7 g/dL — ABNORMAL LOW (ref 6.5–8.1)

## 2019-11-06 LAB — IRON AND TIBC
Iron: 24 ug/dL — ABNORMAL LOW (ref 28–170)
Saturation Ratios: 11 % (ref 10.4–31.8)
TIBC: 228 ug/dL — ABNORMAL LOW (ref 250–450)
UIBC: 204 ug/dL

## 2019-11-06 LAB — MAGNESIUM: Magnesium: 1.5 mg/dL — ABNORMAL LOW (ref 1.7–2.4)

## 2019-11-06 LAB — PROTIME-INR
INR: 1.1 (ref 0.8–1.2)
Prothrombin Time: 14 seconds (ref 11.4–15.2)

## 2019-11-06 MED ORDER — POTASSIUM CHLORIDE CRYS ER 20 MEQ PO TBCR
40.0000 meq | EXTENDED_RELEASE_TABLET | Freq: Once | ORAL | Status: AC
  Administered 2019-11-06: 40 meq via ORAL
  Filled 2019-11-06: qty 2

## 2019-11-06 MED ORDER — FUROSEMIDE 10 MG/ML IJ SOLN
40.0000 mg | Freq: Two times a day (BID) | INTRAMUSCULAR | Status: DC
  Administered 2019-11-06: 40 mg via INTRAVENOUS
  Filled 2019-11-06: qty 4

## 2019-11-06 MED ORDER — OXYCODONE HCL 5 MG PO TABS
5.0000 mg | ORAL_TABLET | Freq: Two times a day (BID) | ORAL | Status: AC | PRN
  Administered 2019-11-06: 5 mg via ORAL
  Filled 2019-11-06 (×2): qty 1

## 2019-11-06 MED ORDER — MAGNESIUM SULFATE 4 GM/100ML IV SOLN
4.0000 g | Freq: Once | INTRAVENOUS | Status: AC
  Administered 2019-11-06: 4 g via INTRAVENOUS
  Filled 2019-11-06: qty 100

## 2019-11-06 NOTE — Progress Notes (Signed)
At shift change off going RN stated BLE wound dressing changes needed to be done for today. RLE wound with covered w/mepilex  And LLE with Mepilex heel. Attempted to change BLE dressings per WOC order, Pt refused stating the wraps were to tight and the doctor told her a few days ago she didn't have to wear them. Xeroform gauze placed to RLE wound and covered w/mepilex, LLE heel wound painted with betadine and covered with Mepilex heel. Will continue to monitor. Dierdre Highman, RN

## 2019-11-06 NOTE — Hospital Course (Addendum)
[name] class A Still has lower extremity edema. Received IV Lasix 40 mg daily and is started on the spironolactone 100 mg p.o. yesterday. Has a positive HAV total antibody but her IgM was negative.  Indicating past infection   Intake output as below.  Net -763 yesterday. (Unsure if urine was collected completely.  But patient mentions that she did not pee as much as expected with diuresis yesterday).  We will continue to reassess.

## 2019-11-06 NOTE — Progress Notes (Addendum)
   Subjective:   Pt seen at the bedside on rounds this AM. Pt states that she doing okay. She has been urinating a lot with the lasix she has been getting.  Leg pain improved and she had to go to sleep last night at one dose of pain medication.  No other complaints.  Objective:  Vital signs in last 24 hours: Vitals:   11/06/19 0031 11/06/19 0059 11/06/19 0702 11/06/19 0932  BP:   (!) 153/77   Pulse: (!) 110 (!) 106 (!) 103   Resp: 17 17 18    Temp:   98 F (36.7 C)   TempSrc:   Oral   SpO2: 97% 99% 96% 98%  Weight:   78.2 kg   Height:        Physical exam: General: Pleasant lady, in no acute distress CV: RRR, normal S1-S2, no murmur, 1+ lower extremity edema (lower extremities are wrapped) Respiratory: Normal work of breathing, no respiratory distress, has mild bibasilar crackle today, no wheezing Abdomen: Nondistended, soft, nontender to palpation Extremities: Lower extremities are wrapped, distals are warm, dorsalis pedis pulses are present bilaterally, 1+ pitting edema present Neurologic exam: Alert and oriented x3 no focal neurologic deficit   Assessment/Plan:  Active Problems:   Hypervolemia   Pressure injury of skin  Fluid overload secondary to alcoholic cirrhosis:   class A  Diuresed well net -2400 mL yesterday. Lower extremity edema improved to 1+, however she has some bibasilar crackles on exam today. Also had some dilated IVC on echo. We will continue IV Lasix 40 mg twice daily today -Continue aspirin lactone -Potassium 3.1.  Replacing with KCl 40 mEq twice a day today -Checking magnesium> 1.5.  Replaced -Creatinine stable at 0.5 -CMP daily.  LFT improving -Check magnesium tomorrow a.m. -Strict I&O's -Weight daily -Fluid restriction -Continue low-dose OxyIR as needed for leg pain (will try to avoid extra dose) -Elevate lower extremities  Alcohol use disorder: -No evidence of withdrawal -Continue CIWA -Continue folate and  B12  Macrocytic anemia: Hemoglobin stable but slowly trending down since admission. No evidence of active bleeding.  Can be gastritis.  We will also need GI follow-up outpatient to consider endoscopy given alcoholic cirrhosis. -Normal folate and B12 -Checking iron panel and monitor CBC -FOBT -Follow-up with GI outpatient  Diarrhea: Prior to admission.  Check hep panel given abnormal LFTs, not suggestive of acute hepatitis. Diarrhea resolved.  C. difficile test canceled given no further diarrhea after admission.  Constipation: Resolved with as needed MiraLAX  Prior to Admission Living Arrangement: Home Anticipated Discharge Location: Home  Dispo: Anticipated discharge in approximately 2-3 days  Georgiann Cocker, MD 11/06/2019, 12:37 PM Pager: @[phone] @

## 2019-11-07 ENCOUNTER — Encounter (HOSPITAL_COMMUNITY): Payer: Self-pay

## 2019-11-07 LAB — COMPREHENSIVE METABOLIC PANEL
ALT: 20 U/L (ref 0–44)
AST: 40 U/L (ref 15–41)
Albumin: 2.1 g/dL — ABNORMAL LOW (ref 3.5–5.0)
Alkaline Phosphatase: 160 U/L — ABNORMAL HIGH (ref 38–126)
Anion gap: 10 (ref 5–15)
BUN: 6 mg/dL (ref 6–20)
CO2: 28 mmol/L (ref 22–32)
Calcium: 8.3 mg/dL — ABNORMAL LOW (ref 8.9–10.3)
Chloride: 103 mmol/L (ref 98–111)
Creatinine, Ser: 0.77 mg/dL (ref 0.44–1.00)
GFR calc Af Amer: >60 mL/min (ref >60)
GFR calc non Af Amer: >60 mL/min (ref >60)
Glucose, Bld: 103 mg/dL — ABNORMAL HIGH (ref 70–99)
Potassium: 3.9 mmol/L (ref 3.5–5.1)
Sodium: 141 mmol/L (ref 135–145)
Total Bilirubin: 1 mg/dL (ref 0.3–1.2)
Total Protein: 5 g/dL — ABNORMAL LOW (ref 6.5–8.1)

## 2019-11-07 LAB — CBC
HCT: 31.1 % — ABNORMAL LOW (ref 36.0–46.0)
Hemoglobin: 10 g/dL — ABNORMAL LOW (ref 12.0–15.0)
MCH: 32.4 pg (ref 26.0–34.0)
MCHC: 32.2 g/dL (ref 30.0–36.0)
MCV: 100.6 fL — ABNORMAL HIGH (ref 80.0–100.0)
Platelets: 381 10*3/uL (ref 150–400)
RBC: 3.09 MIL/uL — ABNORMAL LOW (ref 3.87–5.11)
RDW: 15.6 % — ABNORMAL HIGH (ref 11.5–15.5)
WBC: 8.7 10*3/uL (ref 4.0–10.5)
nRBC: 0 % (ref 0.0–0.2)

## 2019-11-07 LAB — MAGNESIUM: Magnesium: 2 mg/dL (ref 1.7–2.4)

## 2019-11-07 LAB — FERRITIN: Ferritin: 150 ng/mL (ref 11–307)

## 2019-11-07 MED ORDER — SENNOSIDES-DOCUSATE SODIUM 8.6-50 MG PO TABS: 2.0000 | ORAL_TABLET | Freq: Every day | ORAL | Status: DC | PRN

## 2019-11-07 MED ORDER — FUROSEMIDE 40 MG PO TABS
40.0000 mg | ORAL_TABLET | Freq: Two times a day (BID) | ORAL | Status: DC
  Administered 2019-11-07: 40 mg via ORAL
  Filled 2019-11-07: qty 1

## 2019-11-07 MED ORDER — SPIRONOLACTONE 100 MG PO TABS: 100.0000 mg | ORAL_TABLET | Freq: Every day | ORAL | 0 refills | Status: DC

## 2019-11-07 MED ORDER — FUROSEMIDE 40 MG PO TABS: 40.0000 mg | ORAL_TABLET | Freq: Every day | ORAL | 0 refills | Status: DC

## 2019-11-07 MED ORDER — FUROSEMIDE 40 MG PO TABS: 40.0000 mg | ORAL_TABLET | Freq: Every day | ORAL | Status: DC

## 2019-11-07 MED FILL — SPIRONOLACTONE 100 MG TABS: 100 | 30 days supply | Qty: 30 | Fill #0

## 2019-11-07 MED FILL — FUROSEMIDE 40 MG TABLET: 40 | 30 days supply | Qty: 30 | Fill #0

## 2019-11-07 NOTE — Progress Notes (Signed)
Physical Therapy Treatment Patient Details Name: [name] MRN: [id number] DOB: 1961-04-10 Today's Date: 11/07/2019    History of Present Illness Pt is a 59 y/o female with PMH of  decompensated alcoholic cirrhosis with history of esophageal varices, GI bleed, end stage COPD, and hypertension presenting with one week of nausea/vomiting, SOB, LB edema, abdominal pain, and worsening confusion.     PT Comments    The pt was OOB ambulating with the RN upon arrival of PT, agreeable to continued mobility at this time. The pt was able to demo sig improvements in ambulation endurance and capacity for ambulation without need for AD at this time. The pt was able to complete 250 ft with use of a RW and 50 ft without use of an AD with all VSS and no LOB. The pt completed multiple self-care tasks and sit-stand transfers from various surfaces without assist and without need for AD. The pt will continue to benefit from skilled PT to further progress functional independence as well as improved dynamic stability to reduce dependence on AD.     Follow Up Recommendations  No PT follow up     Equipment Recommendations  None recommended by PT    Recommendations for Other Services       Precautions / Restrictions Precautions Precautions: Fall Restrictions Weight Bearing Restrictions: No    Mobility  Bed Mobility Overal bed mobility: Modified Independent             General bed mobility comments: no assist required  Transfers Overall transfer level: Needs assistance Equipment used: None;Rolling walker (2 wheeled) Transfers: Sit to/from Stand Sit to Stand: Modified independent (Device/Increase time)         General transfer comment: pt performed from multiple surfaces including low toilet with RW, grab bars, and without AD. No LOB. assist for line management only. suspect pt is operating at [place] level in room  Ambulation/Gait Ambulation/Gait assistance: Supervision Gait Distance  (Feet): 250 Feet(250 with RW in hallway, then 50 ft in room without AD) Assistive device: Rolling walker (2 wheeled);None Gait Pattern/deviations: Step-through pattern;Decreased stride length Gait velocity: 0.5 m/s Gait velocity interpretation: <1.8 ft/sec, indicate of risk for recurrent falls General Gait Details: ambulated 250 ft in hallway with RW, no SOB or LOB. Pt with good navigation of RW, reports she prefers it to reduce fatigue. Pt also able to ambulate in room without use of AD, no LOB but pt reaching for counter/bed intermittently for added stability.   Stairs             Wheelchair Mobility    Modified Rankin (Stroke Patients Only)       Balance Overall balance assessment: Needs assistance Sitting-balance support: Feet supported Sitting balance-Leahy Scale: Normal     Standing balance support: During functional activity;Bilateral upper extremity supported Standing balance-Leahy Scale: Fair Standing balance comment: pt able to ambulate without AD, reaches for objects for single UE support intermittently                            Cognition Arousal/Alertness: Awake/alert Behavior During Therapy: WFL for tasks assessed/performed Overall Cognitive Status: Within Functional Limits for tasks assessed                                 General Comments: appears [facility] during session      Exercises General Exercises - Lower Extremity Heel Raises:  AROM;Standing;Both;15 reps    General Comments        Pertinent Vitals/Pain Pain Assessment: 0-10 Pain Score: 1  Pain Location: BLE after ambulation states having tingling/sore/pressure Pain Descriptors / Indicators: Tingling;Sore;Pressure Pain Intervention(s): Limited activity within patient's tolerance;Monitored during session    Home Living                      Prior Function            PT Goals (current goals can now be found in the care plan section) Acute Rehab PT  Goals Patient Stated Goal: to get back home  PT Goal Formulation: With patient/family Time For Goal Achievement: 11/18/19 Potential to Achieve Goals: Good Progress towards PT goals: Progressing toward goals    Frequency    Min 3X/week      PT Plan Current plan remains appropriate    Co-evaluation              AM-PAC PT "6 Clicks" Mobility   Outcome Measure  Help needed turning from your back to your side while in a flat bed without using bedrails?: None Help needed moving from lying on your back to sitting on the side of a flat bed without using bedrails?: None Help needed moving to and from a bed to a chair (including a wheelchair)?: A Little Help needed standing up from a chair using your arms (e.g., wheelchair or bedside chair)?: None Help needed to walk in hospital room?: A Little Help needed climbing 3-5 steps with a railing? : A Little 6 Click Score: 21    End of Session Equipment Utilized During Treatment: Gait belt Activity Tolerance: Patient limited by fatigue;Patient tolerated treatment well Patient left: with call bell/phone within reach;in chair Nurse Communication: Mobility status PT Visit Diagnosis: Other abnormalities of gait and mobility (R26.89);Muscle weakness (generalized) (M62.81)     Time: 8144-8185 PT Time Calculation (min) (ACUTE ONLY): 33 min  Charges:  $Gait Training: 23-37 mins                     Rolm Baptise, PT, DPT   Acute Rehabilitation Department Pager #: [phone]   Gaetana Michaelis 11/07/2019, 12:25 PM

## 2019-11-07 NOTE — Care Management (Signed)
1249 11-07-19 Case Manager received consult for primary care provider (PCP), home needs and medication management. Case Manager spoke with patient regarding PCP needs. Patient was seen previously via the Walk in clinic off [address] by [place]. Patient would like to be seen at [facility] [place]- patient will have a co pay and is without insurance at this time. Case Manager will keep the appointment that was scheduled at the [facility] and the patient can cancel if she gets an appointment at [facility] and is agreeable to co pay. Case Manager discussed with MD regarding medications- to be sent to [facility]. MATCH with override and zero co pay will be completed for patient if the Albuterol is ordered. Patient states she was previously doing her own wound care prior to hospitalization. Case Manager did try to reach out to friend Diona Browner 2/2 verbal permission of patient. Case Manager received Voicemail. No further needs from Case Manager at this time. Gala Lewandowsky, RN,BSN Case Manager

## 2019-11-07 NOTE — Discharge Summary (Signed)
Name: [name] MRN: [id number] DOB: 04-04-1961 59 y.o. PCP: Patient, No Pcp Per  Date of Admission: 11/03/2019  2:39 PM Date of Discharge: 11/07/2019 Attending Physician: Dr. Velna Ochs, MD  Discharge Diagnosis: 1. Hypervolemia 2/2 alcoholic cirrhosis 2. Macrocytic anemia 3. Viral gastritis   Discharge Medications: Allergies as of 11/07/2019   No Known Allergies     Medication List    TAKE these medications   acetaminophen 500 MG tablet Commonly known as: TYLENOL Take 500 mg by mouth every 6 (six) hours as needed for moderate pain.   Albuterol Sulfate 2.5 MG/0.5ML Nebu Inhale 2.5 mg into the lungs every 6 (six) hours as needed (sob and wheezing).   budesonide-formoterol 160-4.5 MCG/ACT inhaler Commonly known as: SYMBICORT Inhale 2 puffs into the lungs 2 (two) times daily.   folic acid 1 MG tablet Commonly known as: FOLVITE Take 1 mg by mouth daily.   furosemide 40 MG tablet Commonly known as: LASIX Take 1 tablet (40 mg total) by mouth daily. Start taking on: November 08, 2019   ondansetron 4 MG disintegrating tablet Commonly known as: ZOFRAN-ODT Take 4 mg by mouth every 8 (eight) hours as needed for nausea or vomiting.   oxybutynin 10 MG 24 hr tablet Commonly known as: DITROPAN-XL Take 10 mg by mouth at bedtime.   potassium chloride SA 20 MEQ tablet Commonly known as: KLOR-CON Take 20 mEq by mouth daily.   QUEtiapine 50 MG tablet Commonly known as: SEROQUEL Take 50 mg by mouth at bedtime.   simethicone 125 MG chewable tablet Commonly known as: MYLICON Chew 269 mg by mouth every 6 (six) hours as needed for flatulence.   spironolactone 100 MG tablet Commonly known as: ALDACTONE Take 1 tablet (100 mg total) by mouth daily. Start taking on: November 08, 2019       Disposition and follow-up:   [name] was discharged from [facility] in Stable condition.  At the hospital follow up visit please address:  1.  Hypervolemia 2/2  alcoholic cirrhosis: Presented with lower extremity edema and SOB. Responded well to IV lasix and spironolactone. Discharged on Lasix 38m daily and Spironolactone 1026mdaily. Please ensure patient is taking these. F/u CMP. She will need f/u with GI for cirrhosis.   Viral gastritis: Presented with nausea/vomiting, abdominal pain and diarrhea. Resolved with supportive treatment.   Macrocytic anemia: This is stable. Vitamin B12 and folate levels wnl. Likely 2/2 cirrhosis vs gastritis; F/u CBC  2.  Labs / imaging needed at time of follow-up: CBC, CMP  3.  Pending labs/ test needing follow-up: None  Follow-up Appointments: Follow-up Information    [facility].   Specialty: Family Medicine Why: @ 2:30 for hospital follow-up appointment with MiJuluis MireP- If you cannot make this scheduled appointment please call the clinic to reschedule.  Contact information: [id number].   Why: this location for assistance for medications. Medications range in cost from $4.00-$10.00.  Contact information: [id number] problem list: 1. Hypervolemia 2/2 alcoholic cirrhosis:  Patient admitted with worsening dyspnea, orthopnea and lower extremity edema in setting of history of cirrhosis and [facility] Patient treated with IV Lasix and spironolactone  with good urine output. Electrolytes monitored and repleted as needed. Her LFTs were elevated on arrival. Total bilirubin and PT/INR wnl. Hepatitis panel negative for acute infection. This improved during her hospitalization. Also obtained an Echo during hospitalization which demonstrated LVEF 60-65% without RWMA and grade I diastolic  dysfunction. Patient discharged home with lasix and spironolactone. She is to establish care with PCP at [facility]. She will likely need GI referral for endoscopy/colonoscopy.   2. Viral gastritis: Patient presented with one week history of nausea/vomiting, abdominal pain, diarrhea with decreased PO intake. She had persistent symptoms despite outpatient therapy with imodium and Maalox. Given her symptoms and abnormal LFTs, hepatitis panel obtained which was negative for acute infection. Also ordered C.diff; however, her symptoms resolved with symptomatic management and stool sample unable to be obtained.  3. Macrocytic anemia:  Noted to have macrocytic anemia without evidence of active bleeding. Suspect secondary to alcoholic cirrhosis vs gastritis. Folate and B12 levels wnl. Patient will need outpatient work up for this with GI referral.   Discharge Vitals:   BP 106/68   Pulse 78 Comment: high of 115 with ambulation  Temp 98.2 F (36.8 C) (Oral)   Resp (!) 22   Ht 5' 2"  (1.575 m)   Wt 74 kg   SpO2 96%   BMI 29.85 kg/m   Pertinent Labs, Studies, and Procedures:  CBC Latest Ref Rng & Units 11/07/2019 11/06/2019 11/05/2019  WBC 4.0 - 10.5 K/uL 8.7 8.5 8.7  Hemoglobin 12.0 - 15.0 g/dL 10.0(L) 9.5(L) 10.1(L)  Hematocrit 36.0 - 46.0 % 31.1(L) 29.5(L) 31.7(L)  Platelets 150 - 400 K/uL 381 359 378   CMP Latest Ref Rng & Units 11/07/2019 11/06/2019 11/05/2019  Glucose 70 - 99 mg/dL 103(H) 98 109(H)  BUN 6 - 20 mg/dL 6 5(L) 5(L)  Creatinine 0.44 - 1.00 mg/dL 0.77 0.53 0.53  Sodium 135 - 145 mmol/L 141 141 142  Potassium 3.5 - 5.1 mmol/L 3.9 3.1(L) 3.6  Chloride 98 - 111 mmol/L 103 101 103  CO2 22 - 32 mmol/L 28 29 28   Calcium 8.9 - 10.3 mg/dL 8.3(L) 8.1(L) 8.5(L)  Total Protein 6.5 - 8.1 g/dL 5.0(L) 4.7(L) 5.1(L)  Total Bilirubin 0.3 - 1.2 mg/dL 1.0 0.8 0.8  Alkaline Phos 38 - 126 U/L 160(H) 163(H) 192(H)  AST 15 - 41 U/L 40 36 39  ALT 0 - 44 U/L 20 20 20    Hepatic  Function Latest Ref Rng & Units 11/07/2019 11/06/2019 11/05/2019  Total Protein 6.5 - 8.1 g/dL 5.0(L) 4.7(L) 5.1(L)  Albumin 3.5 - 5.0 g/dL 2.1(L) 2.0(L) 2.1(L)  AST 15 - 41 U/L 40 36 39  ALT 0 - 44 U/L 20 20 20   Alk Phosphatase 38 - 126 U/L 160(H) 163(H) 192(H)  Total Bilirubin 0.3 - 1.2 mg/dL 1.0 0.8 0.8  Bilirubin, Direct 0.0 - 0.2 mg/dL - - -   HIV Screen 4th Generation wRfx NON REACTIVE NON REACTIVE    Hepatitis B-Post Immunity>9.9 mIU/mL <3.1Low     Hepatitis B Surface Ag NON REACTIVE NON REACTIVE    hep A Total Ab NON REACTIVE ReactiveAbnormal     Hep A IgM NON REACTIVE NON REACTIVE    HCV Ab 0.0 - 0.9 s/co ratio <0.1     Urinalysis    Component Value Date/Time   COLORURINE YELLOW 11/03/2019 1930   APPEARANCEUR CLEAR 11/03/2019 1930   LABSPEC 1.011 11/03/2019 1930   PHURINE 9.0 (H) 11/03/2019 1930   GLUCOSEU NEGATIVE 11/03/2019 1930  HGBUR NEGATIVE 11/03/2019 1930   BILIRUBINUR NEGATIVE 11/03/2019 1930   KETONESUR NEGATIVE 11/03/2019 1930   PROTEINUR NEGATIVE 11/03/2019 1930   NITRITE NEGATIVE 11/03/2019 1930   LEUKOCYTESUR NEGATIVE 11/03/2019 1930   Vitamin B-12 180 - 914 pg/mL 807    Iron 28 - 170 ug/dL 24Low    TIBC 250 - 450 ug/dL 228Low    Saturation Ratios 10.4 - 31.8 % 11   UIBC ug/dL 204    Ferritin 11 - 307 ng/mL 150     CXR 11/03/2019: IMPRESSION: 1. Small bilateral pleural effusions, right greater than left. Bibasilar airspace opacities, favored to represent atelectasis, however infiltrate is not excluded. 2. Borderline cardiomegaly.  RUQ [place] 11/03/2019: IMPRESSION: Cirrhosis with possible superimposed hepatic steatosis. No focal hepatic lesion is seen.  ECHO 11/05/2019: IMPRESSIONS  1. Left ventricular ejection fraction, by estimation, is 60 to 65%. The  left ventricle has normal function. The left ventricle has no regional  wall motion abnormalities. Left ventricular diastolic parameters are  consistent with Grade I diastolic  dysfunction  (impaired relaxation).  2. Right ventricular systolic function is normal. The right ventricular  size is normal. There is mildly elevated pulmonary artery systolic  pressure. The estimated right ventricular systolic pressure is 79.8 mmHg.  3. Left atrial size was mildly dilated.  4. The mitral valve is grossly normal. Trivial mitral valve  regurgitation.  5. The aortic valve is tricuspid. Aortic valve regurgitation is not  visualized.  6. The inferior vena cava is dilated in size with <50% respiratory  variability, suggesting right atrial pressure of 15 mmHg.   Discharge Instructions: Discharge Instructions    Call MD for:  difficulty breathing, headache or visual disturbances   Complete by: As directed    Call MD for:  extreme fatigue   Complete by: As directed    Call MD for:  persistant dizziness or light-headedness   Complete by: As directed    Call MD for:  persistant nausea and vomiting   Complete by: As directed    Call MD for:  temperature >100.4   Complete by: As directed    Diet - low sodium heart healthy   Complete by: As directed    Discharge instructions   Complete by: As directed    Ms. Fodge,  You were admitted with leg swelling, shortness of breath, nausea/vomiting and diarrhea. Your diarrhea and nausea/vomiting resolved during this admission. For your leg swelling and shortness of breath, we treated you with IV Lasix to remove the fluid with improvement in your symptoms. On discharge, please continue to take all your medications in addition to the following: Lasix 28m daily Spironolactone 1045mdaily Please schedule a visit to follow up with your PCP and GI doctor to continue to monitor your liver cirrhosis and for EGD to evaluate for any esophageal varices.   Please seek emergent medical attention if you have confusion, chest pain, shortness of breath or leg swelling despite medications.  Thank you!   Increase activity slowly   Complete by: As directed        Signed: AsHarvie HeckMD 11/07/2019, 3:55 PM   Pager: [phone]

## 2019-11-07 NOTE — Progress Notes (Signed)
Pt's stable, dc home via wheelchair 

## 2019-11-07 NOTE — Progress Notes (Signed)
Occupational Therapy Treatment Patient Details Name: [name] MRN: [id number] DOB: [birth year] Today's Date: 11/07/2019    History of present illness Pt is a 59 y/o female with PMH of  decompensated alcoholic cirrhosis with history of esophageal varices, GI bleed, end stage COPD, and hypertension presenting with one week of nausea/vomiting, SOB, LB edema, abdominal pain, and worsening confusion.    OT comments  Pt making progress in therapy, demonstrating improved independence with self-care and functional transfer tasks. Educated/instructed pt on safety strategies, fall prevention, and energy conservation techniques with fair understanding and follow through. Pt able to ambulate to/from bathroom without an AD, requiring supervision and min cues to slow pace. Pt completed toileting task as well as tolerated standing 1 x 5 min at the sink to complete grooming/hygiene tasks. Simulated tub/shower transfer in room with pt requiring min guard. Noted 0 instances of LOB. All questions/concerns answered at this time. OT will continue to follow acutely.    Follow Up Recommendations  No OT follow up;Supervision - Intermittent    Equipment Recommendations  None recommended by OT    Recommendations for Other Services      Precautions / Restrictions Precautions Precautions: Fall Restrictions Weight Bearing Restrictions: No       Mobility Bed Mobility Overal bed mobility: Modified Independent             General bed mobility comments: HOB elevated, use of bed rail  Transfers Overall transfer level: Needs assistance Equipment used: None Transfers: Sit to/from Stand Sit to Stand: Supervision         General transfer comment: To ensure balance and safety    Balance Overall balance assessment: Mild deficits observed, not formally tested Sitting-balance support: Feet supported Sitting balance-Leahy Scale: Normal     Standing balance support: During functional activity;Bilateral  upper extremity supported Standing balance-Leahy Scale: Fair Standing balance comment: pt able to ambulate without AD, reaches for objects for single UE support intermittently                           ADL either performed or assessed with clinical judgement   ADL Overall ADL's : Needs assistance/impaired     Grooming: Wash/dry hands;Wash/dry face;Oral care;Supervision/safety;Set up;Standing Grooming Details (indicate cue type and reason): While standing at the sink                 Toilet Transfer: Supervision/safety;Set up;Ambulation;Regular Toilet;Grab bars   Toileting- Clothing Manipulation and Hygiene: Set up;Supervision/safety;Sit to/from stand   Tub/ Shower Transfer: Min guard;Grab bars;Tub transfer Tub/Shower Transfer Details (indicate cue type and reason): Simulated tub/shower transfer in room. Noted 0 instances of LOB.  Functional mobility during ADLs: Set up;Supervision/safety General ADL Comments: Pt able to ambulate around room without an AD, requiring supervision. Noted 0 instances of LOB. Pt tolerated standing 1 x 5 min at the sink to complete grooming/hygiene tasks.      Vision       Perception     Praxis      Cognition Arousal/Alertness: Awake/alert Behavior During Therapy: WFL for tasks assessed/performed Overall Cognitive Status: Within Functional Limits for tasks assessed                                 General Comments: A&O x 4. Pleasant and willing to participate in therapy        Exercises General Exercises - Lower Extremity Heel Raises: AROM;Standing;Both;15  reps   Shoulder Instructions       General Comments No signs/symptoms of distress.     Pertinent Vitals/ Pain       Pain Assessment: 0-10 Pain Score: 9  Pain Location: Bilateral feet Pain Descriptors / Indicators: Tingling;Sore;Pressure Pain Intervention(s): Monitored during session;Repositioned  Home Living                                           Prior Functioning/Environment              Frequency           Progress Toward Goals  OT Goals(current goals can now be found in the care plan section)  Progress towards OT goals: Progressing toward goals  Acute Rehab OT Goals Patient Stated Goal: to get back home  ADL Goals Pt Will Perform Grooming: with modified independence;standing Pt Will Perform Lower Body Dressing: with modified independence;sit to/from stand Pt Will Transfer to Toilet: with modified independence;ambulating Pt Will Perform Tub/Shower Transfer: Tub transfer;with supervision;shower seat;ambulating Additional ADL Goal #1: Patient will verbalize 3 fall prevention techniques to optimzie safety at home.  Plan Discharge plan remains appropriate    Co-evaluation                 AM-PAC OT "6 Clicks" Daily Activity     Outcome Measure   Help from another person eating meals?: None Help from another person taking care of personal grooming?: A Little Help from another person toileting, which includes using toliet, bedpan, or urinal?: A Little Help from another person bathing (including washing, rinsing, drying)?: A Little Help from another person to put on and taking off regular upper body clothing?: A Little Help from another person to put on and taking off regular lower body clothing?: A Little 6 Click Score: 19    End of Session Equipment Utilized During Treatment: Gait belt  OT Visit Diagnosis: Other abnormalities of gait and mobility (R26.89);Muscle weakness (generalized) (M62.81);Pain Pain - Right/Left: (Bilateral) Pain - part of body: (feet)   Activity Tolerance Patient tolerated treatment well   Patient Left in bed;with call bell/phone within reach   Nurse Communication Mobility status        Time: 1610-9604 OT Time Calculation (min): 15 min  Charges: OT General Charges $OT Visit: 1 Visit OT Treatments $Self Care/Home Management : 8-22 mins  Mauri Brooklyn  OTR/L [phone]   Mauri Brooklyn 11/07/2019, 2:34 PM

## 2019-11-07 NOTE — Progress Notes (Signed)
Subjective: HD#4 Overnight, no acute events reported.  This morning, patient evaluated at bedside this AM. Pt is comfortable sitting up in the bedside chair. Very pleasant. We discussed the need for her to continue PO lasix once she leaves the hospital. Pt is in agreement. She thinks her lower extremity edema has improved a lot. Pt states that she does not have a PCP.   Objective:  Vital signs in last 24 hours: Vitals:   11/06/19 1632 11/06/19 2102 11/07/19 0600 11/07/19 0612  BP: (!) 163/71 (!) 143/65 132/66   Pulse: (!) 106 (!) 108 (!) 55   Resp:  19 (!) 23 19  Temp: 98.4 F (36.9 C) 98.4 F (36.9 C) 98.2 F (36.8 C)   TempSrc: Oral Oral Oral   SpO2: 97% 98% 94%   Weight:   74 kg   Height:       CBC Latest Ref Rng & Units 11/07/2019 11/06/2019 11/05/2019  WBC 4.0 - 10.5 K/uL 8.7 8.5 8.7  Hemoglobin 12.0 - 15.0 g/dL 10.0(L) 9.5(L) 10.1(L)  Hematocrit 36.0 - 46.0 % 31.1(L) 29.5(L) 31.7(L)  Platelets 150 - 400 K/uL 381 359 378   CMP Latest Ref Rng & Units 11/07/2019 11/06/2019 11/05/2019  Glucose 70 - 99 mg/dL 132(G) 98 401(U)  BUN 6 - 20 mg/dL 6 5(L) 5(L)  Creatinine 0.44 - 1.00 mg/dL 2.72 5.36 6.44  Sodium 135 - 145 mmol/L 141 141 142  Potassium 3.5 - 5.1 mmol/L 3.9 3.1(L) 3.6  Chloride 98 - 111 mmol/L 103 101 103  CO2 22 - 32 mmol/L 28 29 28   Calcium 8.9 - 10.3 mg/dL 8.3(L) 8.1(L) 8.5(L)  Total Protein 6.5 - 8.1 g/dL 5.0(L) 4.7(L) 5.1(L)  Total Bilirubin 0.3 - 1.2 mg/dL 1.0 0.8 0.8  Alkaline Phos 38 - 126 U/L 160(H) 163(H) 192(H)  AST 15 - 41 U/L 40 36 39  ALT 0 - 44 U/L 20 20 20     Intake/Output Summary (Last 24 hours) at 11/07/2019 0645 Last data filed at 11/07/2019 01/07/2020 Gross per 24 hour  Intake 1200 ml  Output 4050 ml  Net -2850 ml   Physical Exam  Constitutional: She is oriented to person, place, and time and well-developed, well-nourished, and in no distress.  HENT:  Head: Normocephalic and atraumatic.  Eyes: Conjunctivae and EOM are normal. No scleral icterus.   Cardiovascular: Normal rate, regular rhythm, normal heart sounds and intact distal pulses. Exam reveals no gallop and no friction rub.  No murmur heard. Pulmonary/Chest: Effort normal and breath sounds normal. No respiratory distress. She has no wheezes. She has no rales.  Abdominal: Soft. Bowel sounds are normal. She exhibits no distension. There is no abdominal tenderness.  Musculoskeletal:        General: Edema present. No tenderness. Normal range of motion.     Comments: 2+ pitting edema of bilateral lower extremities to mid shin; improved from prior  Neurological: She is alert and oriented to person, place, and time. No cranial nerve deficit.  Skin: Skin is warm and dry.  RLE with dressing in place L foot with dressing in place at heel    Assessment/Plan:  [name] is a 59 yr old female with history of decompensated liver cirrhosis with esophageal varices secondary to chronic alcohol use, end stage COPD, and hypertension presenting with one week duration of worsening lower extremity edema, nausea/vomiting, abdominal pain, diarrhea and shortness of breath.   Hypervolemia in setting of cirrhosis vs component of diastolic dysfunction Patient has been on  IV lasix 40mg  twice daily with spironolactone 100mg  daily. She had -2.8L output over past 24 hrs and is net -8L since admission and -7kg from admission. Lower extremity edema significantly improved this morning. Lungs clear to auscultation bilaterally. Echo w/LVEF 60-65% without RWMA and grade I diastolic dysfunction.  - Lasix 40mg  PO daily - Spironolactone 100mg  daily - Strict I&O - Monitor and replete electrolytes prn - BMP daily - Continue fluid restriction 1272ml daily  Macrocytic anemia: Hb stable but slowly down trending. No evidence of active bleeding noted. Folate and B12 wnl.  Iron panel with low iron but ferritin wnl. TIBC mildly reduced. No acute need for iron supplementation at this time.  - Continue to monitor  CBC  FEN/GI: Diet: HH/Carb modified GI: Miralax + senna-docusate Fluids: 1233ml fluid restriction  DVT Prophylaxis: Lovenox  Code status: FULL  Prior to Admission Living Arrangement: Home Anticipated Discharge Location: Home w/HHPT? Barriers to Discharge: Ongoing medical evaluation and treatment  Dispo: Anticipated discharge in approximately 0-1 day(s).   Harvie Heck, MD  Internal Medicine, PGY-1 11/07/2019, 6:41 AM Pager: [phone]

## 2019-11-16 ENCOUNTER — Other Ambulatory Visit: Payer: Self-pay

## 2019-11-16 ENCOUNTER — Encounter (INDEPENDENT_AMBULATORY_CARE_PROVIDER_SITE_OTHER): Payer: Self-pay | Admitting: Primary Care

## 2019-11-16 ENCOUNTER — Telehealth (INDEPENDENT_AMBULATORY_CARE_PROVIDER_SITE_OTHER): Payer: BLUE CROSS/BLUE SHIELD | Admitting: Primary Care

## 2019-11-16 DIAGNOSIS — K7030 Alcoholic cirrhosis of liver without ascites: Secondary | ICD-10-CM | POA: Diagnosis not present

## 2019-11-16 DIAGNOSIS — Z09 Encounter for follow-up examination after completed treatment for conditions other than malignant neoplasm: Secondary | ICD-10-CM

## 2019-11-16 DIAGNOSIS — Z760 Encounter for issue of repeat prescription: Secondary | ICD-10-CM

## 2019-11-16 DIAGNOSIS — G4700 Insomnia, unspecified: Secondary | ICD-10-CM | POA: Diagnosis not present

## 2019-11-16 DIAGNOSIS — Z7689 Persons encountering health services in other specified circumstances: Secondary | ICD-10-CM

## 2019-11-16 MED ORDER — METHOCARBAMOL 500 MG PO TABS: 500.0000 mg | ORAL_TABLET | Freq: Three times a day (TID) | ORAL | 1 refills | Status: DC

## 2019-11-16 MED ORDER — SPIRONOLACTONE 100 MG PO TABS: 100.0000 mg | ORAL_TABLET | Freq: Every day | ORAL | 0 refills | Status: DC

## 2019-11-16 MED ORDER — OXYBUTYNIN CHLORIDE ER 10 MG PO TB24: 10.0000 mg | ORAL_TABLET | Freq: Every day | ORAL | 1 refills | Status: DC

## 2019-11-16 MED ORDER — POTASSIUM CHLORIDE CRYS ER 20 MEQ PO TBCR: 20.0000 meq | EXTENDED_RELEASE_TABLET | Freq: Every day | ORAL | 1 refills | Status: DC

## 2019-11-16 MED ORDER — QUETIAPINE FUMARATE 100 MG PO TABS: 50.0000 mg | ORAL_TABLET | Freq: Every day | ORAL | 1 refills | Status: DC

## 2019-11-16 MED ORDER — FOLIC ACID 1 MG PO TABS: 1.0000 mg | ORAL_TABLET | Freq: Every day | ORAL | 1 refills | Status: DC

## 2019-11-16 MED ORDER — FUROSEMIDE 40 MG PO TABS: 40.0000 mg | ORAL_TABLET | Freq: Every day | ORAL | 0 refills | Status: DC

## 2019-11-16 MED ORDER — BUDESONIDE-FORMOTEROL FUMARATE 160-4.5 MCG/ACT IN AERO: 2.0000 | INHALATION_SPRAY | Freq: Two times a day (BID) | RESPIRATORY_TRACT | 11 refills | Status: DC

## 2019-11-16 NOTE — Progress Notes (Signed)
Virtual Visit via Telephone Note  I connected with [name] on 11/16/19 at  2:30 PM EDT by telephone and verified that I am speaking with the correct person using two identifiers.   I discussed the limitations, risks, security and privacy concerns of performing an evaluation and management service by telephone and the availability of in person appointments. I also discussed with the patient that there may be a patient responsible charge related to this service. The patient expressed understanding and agreed to proceed.   History of Present Illness: [name] is a 59 year old Caucasian having a virtual visit to establish care and hospital follow up. She voices muscles spasm and tightness in her lower extremities - 2 prominent wounds on her legs and difficulty sleeping. Patient was  admitted on 11/03/2019 with worsening dyspnea, orthopnea and lower extremity edema , hx of  cirrhosis and multiple recent admissions to the hospital .She states she took her last alcohol drink 06/2019 Past Medical History:  Diagnosis Date  . COPD (chronic obstructive pulmonary disease) ([place])   . Hypertension    Cirrhosis of the liver   Observations/Objective: Review of Systems  Musculoskeletal:       Bilateral leg pain with muscle spasm   All other systems reviewed and are negative.  Assessment and Plan: [name] was seen today for hospitalization follow-up and medication refill.  Diagnoses and all orders for this visit:  Alcoholic cirrhosis, unspecified whether ascites present ([place]) -     CMP14+EGFR; Future  Encounter to establish care Julie Mire, NP-C will be your  (PCP) she is mastered prepared . Able to diagnosed and treatment also  answer health concern as well as continuing care of varied medical conditions, not limited by cause, organ system, or diagnosis.   Hospital discharge follow-up Discharge Diagnosis on 11/07/2019 1. Hypervolemia 2/2 alcoholic cirrhosis 2. Macrocytic anemia 3. Viral  gastritis  Establish care at [facility]  Insomnia, unspecified type Seroquel can be taken at bedtime and can help with insomnia discussed a quiet place, soothing music no caffeine or sweets close to bedtime  Other orders/Medication refill -     QUEtiapine (SEROQUEL) 100 MG tablet; Take 0.5 tablets (50 mg total) by mouth at bedtime. -     furosemide (LASIX) 40 MG tablet; Take 1 tablet (40 mg total) by mouth daily. -     potassium chloride SA (KLOR-CON) 20 MEQ tablet; Take 1 tablet (20 mEq total) by mouth daily. -     budesonide-formoterol (SYMBICORT) 160-4.5 MCG/ACT inhaler; Inhale 2 puffs into the lungs 2 (two) times daily. -     spironolactone (ALDACTONE) 100 MG tablet; Take 1 tablet (100 mg total) by mouth daily. -     oxybutynin (DITROPAN-XL) 10 MG 24 hr tablet; Take 1 tablet (10 mg total) by mouth at bedtime. -     folic acid (FOLVITE) 1 MG tablet; Take 1 tablet (1 mg total) by mouth daily. -     methocarbamol (ROBAXIN) 500 MG tablet; Take 1 tablet (500 mg total) by mouth 3 (three) times daily.   Follow Up Instructions:    I discussed the assessment and treatment plan with the patient. The patient was provided an opportunity to ask questions and all were answered. The patient agreed with the plan and demonstrated an understanding of the instructions.   The patient was advised to call back or seek an in-person evaluation if the symptoms worsen or if the condition fails to improve as anticipated.  I provided  21 minutes  of non-face-to-face time during this encounter.   Kerin Perna, NP

## 2019-11-16 NOTE — Progress Notes (Signed)
Pt complains that her muscle are really tense now that she is walking more- would like a muscle relaxer Pt would like something for sleep- she is not sleeping at night Pain in shoulders, back, hands,  Left side of face feels numb

## 2019-11-23 ENCOUNTER — Telehealth (INDEPENDENT_AMBULATORY_CARE_PROVIDER_SITE_OTHER): Payer: Self-pay

## 2019-11-23 ENCOUNTER — Other Ambulatory Visit (INDEPENDENT_AMBULATORY_CARE_PROVIDER_SITE_OTHER): Payer: Self-pay | Admitting: Primary Care

## 2019-11-23 MED ORDER — QUETIAPINE FUMARATE 100 MG PO TABS: 100.0000 mg | ORAL_TABLET | Freq: Every day | ORAL | 1 refills | Status: DC

## 2019-11-23 NOTE — Telephone Encounter (Signed)
Patient called stating PCP was to prescribe Seroquel 100mg  but 50 mg was ordered. Patient called to ask if the correct dose could be sent to her pharmacy. CMA advised patient to take 2 pills until bottle empty. New Rx will be sent per PCP. Please send to patients pharmacy. 

## 2019-11-30 ENCOUNTER — Telehealth (INDEPENDENT_AMBULATORY_CARE_PROVIDER_SITE_OTHER): Payer: Self-pay

## 2019-11-30 NOTE — Telephone Encounter (Signed)
Patient contacted and advised to go to urgent care to have wound looked at.

## 2019-11-30 NOTE — Telephone Encounter (Signed)
Patient called stating she is having left shoulder pain and has a wound on her left ankle. Patient states it hurts to apply pressure. Patient states it is gushing fluid and has an odor to it. Patient would like to know if PCP can send her to a wound clinic.  Patient does not have insurance please place referral to [facility] wound clinic.   Please advice [phone]

## 2019-12-05 ENCOUNTER — Other Ambulatory Visit (INDEPENDENT_AMBULATORY_CARE_PROVIDER_SITE_OTHER): Payer: Self-pay | Admitting: Primary Care

## 2019-12-05 ENCOUNTER — Other Ambulatory Visit: Payer: Self-pay

## 2019-12-05 ENCOUNTER — Ambulatory Visit (INDEPENDENT_AMBULATORY_CARE_PROVIDER_SITE_OTHER): Payer: Medicaid Other | Admitting: Primary Care

## 2019-12-05 ENCOUNTER — Encounter (INDEPENDENT_AMBULATORY_CARE_PROVIDER_SITE_OTHER): Payer: Self-pay | Admitting: Primary Care

## 2019-12-05 VITALS — BP 166/87 | HR 97 | Resp 16

## 2019-12-05 DIAGNOSIS — I10 Essential (primary) hypertension: Secondary | ICD-10-CM | POA: Diagnosis not present

## 2019-12-05 DIAGNOSIS — F329 Major depressive disorder, single episode, unspecified: Secondary | ICD-10-CM | POA: Diagnosis not present

## 2019-12-05 DIAGNOSIS — M25512 Pain in left shoulder: Secondary | ICD-10-CM

## 2019-12-05 DIAGNOSIS — K7030 Alcoholic cirrhosis of liver without ascites: Secondary | ICD-10-CM

## 2019-12-05 DIAGNOSIS — F32A Depression, unspecified: Secondary | ICD-10-CM

## 2019-12-05 DIAGNOSIS — J0100 Acute maxillary sinusitis, unspecified: Secondary | ICD-10-CM

## 2019-12-05 MED ORDER — AMLODIPINE BESYLATE 10 MG PO TABS: 10.0000 mg | ORAL_TABLET | Freq: Every day | ORAL | 3 refills | Status: DC

## 2019-12-05 MED ORDER — IBUPROFEN 600 MG PO TABS: 600.0000 mg | ORAL_TABLET | Freq: Three times a day (TID) | ORAL | 1 refills | Status: DC | PRN

## 2019-12-05 MED ORDER — AZITHROMYCIN 250 MG PO TABS: ORAL_TABLET | ORAL | 0 refills | Status: DC

## 2019-12-05 MED ORDER — FLUCONAZOLE 150 MG PO TABS: 150.0000 mg | ORAL_TABLET | Freq: Once | ORAL | 0 refills | Status: AC

## 2019-12-05 NOTE — Progress Notes (Signed)
Established Patient Office Visit  Subjective:  Patient ID: [name], female    DOB: [birth year]  Age: 59 y.o. MRN: [id number]  CC:  Chief Complaint  Patient presents with  . Shoulder Pain    HPI [name] is a 60 year old female presents for shoulder pain , epistasis and allergies symptoms. Blood pressure elevated .Denies shortness of breath, headaches, chest pain or lower extremity edema  Past Medical History:  Diagnosis Date  . COPD (chronic obstructive pulmonary disease) ([place])   . Hypertension     Past Surgical History:  Procedure Laterality Date  . ABDOMINAL HYSTERECTOMY      Family History  Problem Relation Age of Onset  . Ulcers Father     Social History   Socioeconomic History  . Marital status: Single    Spouse name: Not on file  . Number of children: 0  . Years of education: 59  . Highest education level: GED or equivalent  Occupational History  . Occupation: Animal nutritionist  Tobacco Use  . Smoking status: Former Smoker    Packs/day: 1.00    Years: 25.00    Pack years: 25.00    Types: Cigarettes    Start date: 08/04/1978    Quit date: 06/05/2019    Years since quitting: 0.5  . Smokeless tobacco: Never Used  Substance and Sexual Activity  . Alcohol use: Not Currently  . Drug use: Never  . Sexual activity: Never  Other Topics Concern  . Not on file  Social History Narrative   Lives and step-dad, they are able to help.   Social Determinants of Health   Financial Resource Strain:   . Difficulty of Paying Living Expenses:   Food Insecurity:   . Worried About Charity fundraiser in the Last Year:   . Arboriculturist in the Last Year:   Transportation Needs:   . Film/video editor (Medical):   Marland Kitchen Lack of Transportation (Non-Medical):   Physical Activity:   . Days of Exercise per Week:   . Minutes of Exercise per Session:   Stress:   . Feeling of Stress :   Social Connections:   . Frequency of Communication with Friends and  Family:   . Frequency of Social Gatherings with Friends and Family:   . Attends Religious Services:   . Active Member of Clubs or Organizations:   . Attends Archivist Meetings:   Marland Kitchen Marital Status:   Intimate Partner Violence:   . Fear of Current or Ex-Partner:   . Emotionally Abused:   Marland Kitchen Physically Abused:   . Sexually Abused:     Outpatient Medications Prior to Visit  Medication Sig Dispense Refill  . acetaminophen (TYLENOL) 500 MG tablet Take 500 mg by mouth every 6 (six) hours as needed for moderate pain.    . Albuterol Sulfate 2.5 MG/0.5ML NEBU Inhale 2.5 mg into the lungs every 6 (six) hours as needed (sob and wheezing).    . budesonide-formoterol (SYMBICORT) 160-4.5 MCG/ACT inhaler Inhale 2 puffs into the lungs 2 (two) times daily. 1 Inhaler 11  . folic acid (FOLVITE) 1 MG tablet Take 1 tablet (1 mg total) by mouth daily. 90 tablet 1  . furosemide (LASIX) 40 MG tablet Take 1 tablet (40 mg total) by mouth daily. 30 tablet 0  . methocarbamol (ROBAXIN) 500 MG tablet Take 1 tablet (500 mg total) by mouth 3 (three) times daily. 60 tablet 1  . ondansetron (ZOFRAN-ODT) 4 MG disintegrating  tablet Take 4 mg by mouth every 8 (eight) hours as needed for nausea or vomiting.    Marland Kitchen oxybutynin (DITROPAN-XL) 10 MG 24 hr tablet Take 1 tablet (10 mg total) by mouth at bedtime. 30 tablet 1  . potassium chloride SA (KLOR-CON) 20 MEQ tablet Take 1 tablet (20 mEq total) by mouth daily. 30 tablet 1  . QUEtiapine (SEROQUEL) 100 MG tablet Take 1 tablet (100 mg total) by mouth at bedtime. 90 tablet 1  . spironolactone (ALDACTONE) 100 MG tablet Take 1 tablet (100 mg total) by mouth daily. 30 tablet 0  . simethicone (MYLICON) 950 MG chewable tablet Chew 125 mg by mouth every 6 (six) hours as needed for flatulence.     No facility-administered medications prior to visit.    No Known Allergies  ROS Review of Systems    Objective:    Physical Exam  BP (!) 170/88 (BP Location: Left Arm,  Patient Position: Sitting, Cuff Size: Normal)   Pulse 97   Resp 16   SpO2 96%  Wt Readings from Last 3 Encounters:  11/07/19 163 lb 3.2 oz (74 kg)     Health Maintenance Due  Topic Date Due  . COVID-19 Vaccine (1) Never done  . TETANUS/TDAP  Never done  . PAP SMEAR-Modifier  Never done  . MAMMOGRAM  Never done  . COLONOSCOPY  Never done    There are no preventive care reminders to display for this patient.  No results found for: TSH Lab Results  Component Value Date   WBC 8.7 11/07/2019   HGB 10.0 (L) 11/07/2019   HCT 31.1 (L) 11/07/2019   MCV 100.6 (H) 11/07/2019   PLT 381 11/07/2019   Lab Results  Component Value Date   NA 141 11/07/2019   K 3.9 11/07/2019   CO2 28 11/07/2019   GLUCOSE 103 (H) 11/07/2019   BUN 6 11/07/2019   CREATININE 0.77 11/07/2019   BILITOT 1.0 11/07/2019   ALKPHOS 160 (H) 11/07/2019   AST 40 11/07/2019   ALT 20 11/07/2019   PROT 5.0 (L) 11/07/2019   ALBUMIN 2.1 (L) 11/07/2019   CALCIUM 8.3 (L) 11/07/2019   ANIONGAP 10 11/07/2019   No results found for: CHOL No results found for: HDL No results found for: LDLCALC No results found for: TRIG No results found for: [facility] Lab Results  Component Value Date   HGBA1C 5.1 11/04/2019      Assessment & Plan:   Problem List Items Addressed This Visit   Lakira was seen today for shoulder pain.  Diagnoses and all orders for this visit:  Alcoholic cirrhosis, unspecified whether ascites present [facility]) -     Ambulatory referral to Gastroenterology -     CMP14+EGFR -     CBC with Differential  Depression, unspecified depression type Depression screen [facility] 2/9 12/05/2019 11/16/2019  Decreased Interest 3 0  Down, Depressed, Hopeless 3 0  PHQ - 2 Score 6 0  Altered sleeping 1 -  Tired, decreased energy 1 -  Change in appetite 3 -  Feeling bad or failure about yourself  3 -  Trouble concentrating 3 -  Moving slowly or fidgety/restless 3 -  Suicidal thoughts 3 -  PHQ-9 Score 23 -  Follow  up with CSW  Essential hypertension Counseled on blood pressure goal of less than 130/80, low-sodium, DASH diet, medication compliance, 150 minutes of moderate intensity exercise per week. Discussed medication compliance, adverse effects. -     CMP14+EGFR  Acute maxillary sinusitis, recurrence  not specified On antihistamine ant treated with Z pack  Pain in joint of left shoulder . May alternate with heat and ice application for pain relief. May also alternate with acetaminophen and Ibuprofen 668m as needed Q 6 -8 hrsas prescribed pain relief. Other alternatives include massage, acupuncture and water aerobics.  You must stay active and avoid a sedentary lifestyle.  Other orders -     ibuprofen (ADVIL) 600 MG tablet; Take 1 tablet (600 mg total) by mouth every 8 (eight) hours as needed. -     amLODipine (NORVASC) 10 MG tablet; Take 1 tablet (10 mg total) by mouth daily. -     Discontinue: azithromycin (ZITHROMAX) 250 MG tablet; Take 2 tablets day 1 than 1 tablet daily until gone -     fluconazole (DIFLUCAN) 150 MG tablet; Take 1 tablet (150 mg total) by mouth once for 1 dose.      Meds ordered this encounter  Medications  . ibuprofen (ADVIL) 600 MG tablet    Sig: Take 1 tablet (600 mg total) by mouth every 8 (eight) hours as needed.    Dispense:  90 tablet    Refill:  1  . amLODipine (NORVASC) 10 MG tablet    Sig: Take 1 tablet (10 mg total) by mouth daily.    Dispense:  90 tablet    Refill:  3  . azithromycin (ZITHROMAX) 250 MG tablet    Sig: Take 2 tablets day 1 than 1 tablet daily until gone    Dispense:  6 tablet    Refill:  0  . fluconazole (DIFLUCAN) 150 MG tablet    Sig: Take 1 tablet (150 mg total) by mouth once for 1 dose.    Dispense:  1 tablet    Refill:  0    Follow-up: Return for ASAP in person Jasmine PHQ 23( Bp check on blood work visit).    MKerin Perna NP

## 2019-12-05 NOTE — Patient Instructions (Signed)

## 2019-12-05 NOTE — Progress Notes (Signed)
Left shoulder pain. Does not have much relief with muscle relaxer. Wound on left heel. Feels that when it open foot swelling went down.   Stress/ GAD- PHQ-9

## 2019-12-06 LAB — CBC WITH DIFFERENTIAL/PLATELET
Basophils Absolute: 0.1 10*3/uL (ref 0.0–0.2)
Basos: 1 %
EOS (ABSOLUTE): 0.6 10*3/uL — ABNORMAL HIGH (ref 0.0–0.4)
Eos: 5 %
Hematocrit: 39 % (ref 34.0–46.6)
Hemoglobin: 12.6 g/dL (ref 11.1–15.9)
Immature Grans (Abs): 0.1 10*3/uL (ref 0.0–0.1)
Immature Granulocytes: 1 %
Lymphocytes Absolute: 2.6 10*3/uL (ref 0.7–3.1)
Lymphs: 23 %
MCH: 30.7 pg (ref 26.6–33.0)
MCHC: 32.3 g/dL (ref 31.5–35.7)
MCV: 95 fL (ref 79–97)
Monocytes Absolute: 1 10*3/uL — ABNORMAL HIGH (ref 0.1–0.9)
Monocytes: 8 %
Neutrophils Absolute: 7.2 10*3/uL — ABNORMAL HIGH (ref 1.4–7.0)
Neutrophils: 62 %
Platelets: 496 10*3/uL — ABNORMAL HIGH (ref 150–450)
RBC: 4.1 x10E6/uL (ref 3.77–5.28)
RDW: 13.1 % (ref 11.7–15.4)
WBC: 11.5 10*3/uL — ABNORMAL HIGH (ref 3.4–10.8)

## 2019-12-06 LAB — CMP14+EGFR
ALT: 12 IU/L (ref 0–32)
AST: 22 IU/L (ref 0–40)
Albumin/Globulin Ratio: 1.4 (ref 1.2–2.2)
Albumin: 3.9 g/dL (ref 3.8–4.9)
Alkaline Phosphatase: 129 IU/L — ABNORMAL HIGH (ref 39–117)
BUN/Creatinine Ratio: 7 — ABNORMAL LOW (ref 9–23)
BUN: 5 mg/dL — ABNORMAL LOW (ref 6–24)
Bilirubin Total: 0.3 mg/dL (ref 0.0–1.2)
CO2: 27 mmol/L (ref 20–29)
Calcium: 9.6 mg/dL (ref 8.7–10.2)
Chloride: 98 mmol/L (ref 96–106)
Creatinine, Ser: 0.67 mg/dL (ref 0.57–1.00)
GFR calc Af Amer: 111 mL/min/{1.73_m2} (ref >59)
GFR calc non Af Amer: 97 mL/min/{1.73_m2} (ref >59)
Globulin, Total: 2.8 g/dL (ref 1.5–4.5)
Glucose: 104 mg/dL — ABNORMAL HIGH (ref 65–99)
Potassium: 3.6 mmol/L (ref 3.5–5.2)
Sodium: 143 mmol/L (ref 134–144)
Total Protein: 6.7 g/dL (ref 6.0–8.5)

## 2019-12-13 ENCOUNTER — Other Ambulatory Visit: Payer: Self-pay

## 2019-12-13 ENCOUNTER — Other Ambulatory Visit (INDEPENDENT_AMBULATORY_CARE_PROVIDER_SITE_OTHER): Payer: Self-pay | Admitting: Primary Care

## 2019-12-13 ENCOUNTER — Ambulatory Visit (INDEPENDENT_AMBULATORY_CARE_PROVIDER_SITE_OTHER): Payer: Medicaid Other | Admitting: Licensed Clinical Social Worker

## 2019-12-13 ENCOUNTER — Other Ambulatory Visit (INDEPENDENT_AMBULATORY_CARE_PROVIDER_SITE_OTHER): Payer: Self-pay | Admitting: *Deleted

## 2019-12-13 DIAGNOSIS — F331 Major depressive disorder, recurrent, moderate: Secondary | ICD-10-CM

## 2019-12-13 MED ORDER — LORATADINE 10 MG PO TABS: 10.0000 mg | ORAL_TABLET | Freq: Every day | ORAL | 11 refills | Status: DC

## 2019-12-13 MED ORDER — FUROSEMIDE 40 MG PO TABS: 40.0000 mg | ORAL_TABLET | Freq: Every day | ORAL | 0 refills | Status: DC

## 2019-12-13 MED ORDER — OXYBUTYNIN CHLORIDE ER 10 MG PO TB24: 10.0000 mg | ORAL_TABLET | Freq: Every day | ORAL | 0 refills | Status: DC

## 2019-12-13 MED ORDER — BUDESONIDE-FORMOTEROL FUMARATE 160-4.5 MCG/ACT IN AERO: 2.0000 | INHALATION_SPRAY | Freq: Two times a day (BID) | RESPIRATORY_TRACT | 11 refills | Status: DC

## 2019-12-13 NOTE — BH Specialist Note (Signed)
Integrated Behavioral Health Initial Visit  MRN: [id number] Name: [name]  Number of Integrated Behavioral Health Clinician visits:: 1/6 Session Start time: 10:40 AM  Session End time: 11:15 Total time: 35   Type of Service: Integrated Behavioral Health- Individual Interpretor:No. Interpretor Name and Language: NA   SUBJECTIVE: [name] is a 59 y.o. female accompanied by Self Patient was referred by NP Edwards for depression. Patient reports the following symptoms/concerns: Patient reports increase in depression anxiety symptoms triggered by family strain, chronic pain, and difficulty sleeping Duration of problem: Ongoing; Severity of problem: moderate  OBJECTIVE: Mood: Anxious and Affect: Appropriate Risk of harm to self or others: No plan to harm self or others  LIFE CONTEXT: Family and Social: Patient resides with her mother and step-father for the last 2 months School/Work: Patient applied for disability Self-Care: Patient shared that she has not drank alcohol since November 2020 Life Changes: Patient reports psychosocial stressors  GOALS ADDRESSED: Patient will: 1. Reduce symptoms of: anxiety, depression and stress 2. Increase knowledge and/or ability of: coping skills and healthy habits  3. Demonstrate ability to: Increase healthy adjustment to current life circumstances and Increase adequate support systems for patient/family  INTERVENTIONS: Interventions utilized: Solution-Focused Strategies, Supportive Counseling and Psychoeducation and/or Health Education  Standardized Assessments completed: Not Needed  ASSESSMENT: Patient currently experiencing difficulty managing increase in depression anxiety symptoms triggered by stress and chronic pain.   Patient may benefit from brief therapy and medication management.  LCSW provided support and validation.  Healthy coping skills to assist in decrease and/or management of symptoms were discussed.  Patient is  interested in medication management through PCP.  PLAN: 1. Follow up with behavioral health clinician on : Contact LCSW with any additional behavioral health and/or resource needs 2. Behavioral recommendations: Utilize strategies discussed 3. Referral(s): Integrated Behavioral Health Services (In Clinic) 4. "From scale of 1-10, how likely are you to follow plan?":   Bridgett Larsson, LCSW 01/19/2020 4:39 PM

## 2019-12-14 ENCOUNTER — Other Ambulatory Visit (INDEPENDENT_AMBULATORY_CARE_PROVIDER_SITE_OTHER): Payer: Self-pay | Admitting: Primary Care

## 2019-12-16 ENCOUNTER — Telehealth (INDEPENDENT_AMBULATORY_CARE_PROVIDER_SITE_OTHER): Payer: Self-pay

## 2019-12-16 DIAGNOSIS — F1011 Alcohol abuse, in remission: Secondary | ICD-10-CM | POA: Insufficient documentation

## 2019-12-16 DIAGNOSIS — F411 Generalized anxiety disorder: Secondary | ICD-10-CM | POA: Insufficient documentation

## 2019-12-16 DIAGNOSIS — F17210 Nicotine dependence, cigarettes, uncomplicated: Secondary | ICD-10-CM | POA: Insufficient documentation

## 2019-12-16 NOTE — Telephone Encounter (Signed)
Patient called stating her feet have been swelling more then uses and feels feverish. Patient states it can get painful. Patient was advice to got to Urgent Care or the Emergency room for further evaluation. Patient wanted to schedule an appointment for May 20 @ 1:30 pm.  Please advice [phone]

## 2019-12-22 ENCOUNTER — Other Ambulatory Visit: Payer: Self-pay

## 2019-12-22 ENCOUNTER — Ambulatory Visit (INDEPENDENT_AMBULATORY_CARE_PROVIDER_SITE_OTHER): Payer: Medicaid Other | Admitting: Primary Care

## 2019-12-22 ENCOUNTER — Encounter (INDEPENDENT_AMBULATORY_CARE_PROVIDER_SITE_OTHER): Payer: Self-pay | Admitting: Primary Care

## 2019-12-22 VITALS — BP 157/82 | HR 111 | Temp 97.2°F | Ht 62.0 in | Wt 149.4 lb

## 2019-12-22 DIAGNOSIS — F32A Depression, unspecified: Secondary | ICD-10-CM

## 2019-12-22 DIAGNOSIS — K7030 Alcoholic cirrhosis of liver without ascites: Secondary | ICD-10-CM | POA: Diagnosis not present

## 2019-12-22 DIAGNOSIS — L89899 Pressure ulcer of other site, unspecified stage: Secondary | ICD-10-CM | POA: Diagnosis not present

## 2019-12-22 DIAGNOSIS — I10 Essential (primary) hypertension: Secondary | ICD-10-CM

## 2019-12-22 DIAGNOSIS — F329 Major depressive disorder, single episode, unspecified: Secondary | ICD-10-CM | POA: Diagnosis not present

## 2019-12-22 MED ORDER — FUROSEMIDE 20 MG PO TABS: ORAL_TABLET | ORAL | 0 refills | Status: DC

## 2019-12-22 MED FILL — FUROSEMIDE 20 MG TABS: 20 | 30 days supply | Qty: 30 | Fill #0

## 2019-12-22 NOTE — Progress Notes (Signed)
Edema for about 5 days  Pain for about a week

## 2019-12-22 NOTE — Progress Notes (Signed)
Established Patient Office Visit  Subjective:  Patient ID: [name], female    DOB: Jul 17, 1961  Age: 59 y.o. MRN: [id number]  CC:  Chief Complaint  Patient presents with  . Edema    legs and feet  . Pain    arms and shoulder  . Epistaxis    HPI Ms. Khalie Wince is a 59 year old female presents for concerns of bilateral lower extremities edema. Note on diuretics. She also, continues to have epistasis. Previous visit treated with antibiotics for a sinus infection. Blood pressure remains uncontrolled but steadily lowering. Denies shortness of breath, headaches, chest pain or  sudden onset, vision changes, unilateral weakness, dizziness, paresthesias   Past Medical History:  Diagnosis Date  . COPD (chronic obstructive pulmonary disease) (HCC)   . Hypertension     Past Surgical History:  Procedure Laterality Date  . ABDOMINAL HYSTERECTOMY      Family History  Problem Relation Age of Onset  . Ulcers Father     Social History   Socioeconomic History  . Marital status: Single    Spouse name: Not on file  . Number of children: 0  . Years of education: 81  . Highest education level: GED or equivalent  Occupational History  . Occupation: Armed forces operational officer  Tobacco Use  . Smoking status: Former Smoker    Packs/day: 1.00    Years: 25.00    Pack years: 25.00    Types: Cigarettes    Start date: 08/04/1978    Quit date: 06/05/2019    Years since quitting: 0.5  . Smokeless tobacco: Never Used  Substance and Sexual Activity  . Alcohol use: Not Currently  . Drug use: Never  . Sexual activity: Never  Other Topics Concern  . Not on file  Social History Narrative   Lives and step-dad, they are able to help.   Social Determinants of Health   Financial Resource Strain:   . Difficulty of Paying Living Expenses:   Food Insecurity:   . Worried About Programme researcher, broadcasting/film/video in the Last Year:   . Barista in the Last Year:   Transportation Needs:   . Automotive engineer (Medical):   Marland Kitchen Lack of Transportation (Non-Medical):   Physical Activity:   . Days of Exercise per Week:   . Minutes of Exercise per Session:   Stress:   . Feeling of Stress :   Social Connections:   . Frequency of Communication with Friends and Family:   . Frequency of Social Gatherings with Friends and Family:   . Attends Religious Services:   . Active Member of Clubs or Organizations:   . Attends Banker Meetings:   Marland Kitchen Marital Status:   Intimate Partner Violence:   . Fear of Current or Ex-Partner:   . Emotionally Abused:   Marland Kitchen Physically Abused:   . Sexually Abused:     Outpatient Medications Prior to Visit  Medication Sig Dispense Refill  . acetaminophen (TYLENOL) 500 MG tablet Take 500 mg by mouth every 6 (six) hours as needed for moderate pain.    . Albuterol Sulfate 2.5 MG/0.5ML NEBU Inhale 2.5 mg into the lungs every 6 (six) hours as needed (sob and wheezing).    Marland Kitchen amLODipine (NORVASC) 10 MG tablet Take 1 tablet (10 mg total) by mouth daily. 90 tablet 3  . budesonide-formoterol (SYMBICORT) 160-4.5 MCG/ACT inhaler Inhale 2 puffs into the lungs 2 (two) times daily. 1 Inhaler 11  . folic acid (  FOLVITE) 1 MG tablet Take 1 tablet (1 mg total) by mouth daily. 90 tablet 1  . ibuprofen (ADVIL) 600 MG tablet Take 1 tablet (600 mg total) by mouth every 8 (eight) hours as needed. 90 tablet 1  . loratadine (CLARITIN) 10 MG tablet Take 1 tablet (10 mg total) by mouth daily. 30 tablet 11  . methocarbamol (ROBAXIN) 500 MG tablet Take 1 tablet (500 mg total) by mouth 3 (three) times daily. 60 tablet 1  . ondansetron (ZOFRAN-ODT) 4 MG disintegrating tablet Take 4 mg by mouth every 8 (eight) hours as needed for nausea or vomiting.    Marland Kitchen oxybutynin (DITROPAN-XL) 10 MG 24 hr tablet Take 1 tablet (10 mg total) by mouth at bedtime. 30 tablet 0  . potassium chloride SA (KLOR-CON) 20 MEQ tablet Take 1 tablet (20 mEq total) by mouth daily. 30 tablet 1  . QUEtiapine (SEROQUEL)  100 MG tablet Take 1 tablet (100 mg total) by mouth at bedtime. 90 tablet 1  . simethicone (MYLICON) 125 MG chewable tablet Chew 125 mg by mouth every 6 (six) hours as needed for flatulence.    . furosemide (LASIX) 40 MG tablet TAKE 1 TABLET(40 MG) BY MOUTH DAILY 30 tablet 0  . spironolactone (ALDACTONE) 100 MG tablet Take 1 tablet (100 mg total) by mouth daily. 30 tablet 0  . azithromycin (ZITHROMAX) 250 MG tablet Take 2 tablets day 1 than 1 tablet daily until gone 6 tablet 0   No facility-administered medications prior to visit.    No Known Allergies  ROS Review of Systems  Constitutional: Positive for fatigue.  HENT: Positive for nosebleeds.   Eyes: Positive for redness and itching.  Cardiovascular: Positive for leg swelling.  Skin: Positive for color change.       Legs   Psychiatric/Behavioral: Positive for agitation and decreased concentration. The patient is nervous/anxious.   All other systems reviewed and are negative.     Objective:    Physical Exam  Constitutional: She is oriented to person, place, and time. She appears well-developed and well-nourished.  Cardiovascular: Normal rate and regular rhythm.  Pulmonary/Chest: Effort normal and breath sounds normal.  Abdominal: Soft. Bowel sounds are normal.  Musculoskeletal:        General: Normal range of motion.     Cervical back: Normal range of motion.  Neurological: She is alert and oriented to person, place, and time.  Skin: Skin is warm and dry.  Psychiatric: She has a normal mood and affect. Her behavior is normal. Judgment and thought content normal.    BP (!) 157/82 (BP Location: Left Arm, Patient Position: Sitting, Cuff Size: Normal)   Pulse (!) 111   Temp (!) 97.2 F (36.2 C) (Temporal)   Ht 5\' 2"  (1.575 m)   Wt 149 lb 6.4 oz (67.8 kg)   SpO2 96%   BMI 27.33 kg/m  Wt Readings from Last 3 Encounters:  12/22/19 149 lb 6.4 oz (67.8 kg)  11/07/19 163 lb 3.2 oz (74 kg)     Health Maintenance Due    Topic Date Due  . COVID-19 Vaccine (1) Never done  . TETANUS/TDAP  Never done  . PAP SMEAR-Modifier  Never done  . MAMMOGRAM  Never done  . COLONOSCOPY  Never done    There are no preventive care reminders to display for this patient.  No results found for: TSH Lab Results  Component Value Date   WBC 11.5 (H) 12/05/2019   HGB 12.6 12/05/2019   HCT 39.0  12/05/2019   MCV 95 12/05/2019   PLT 496 (H) 12/05/2019   Lab Results  Component Value Date   NA 143 12/05/2019   K 3.6 12/05/2019   CO2 27 12/05/2019   GLUCOSE 104 (H) 12/05/2019   BUN 5 (L) 12/05/2019   CREATININE 0.67 12/05/2019   BILITOT 0.3 12/05/2019   ALKPHOS 129 (H) 12/05/2019   AST 22 12/05/2019   ALT 12 12/05/2019   PROT 6.7 12/05/2019   ALBUMIN 3.9 12/05/2019   CALCIUM 9.6 12/05/2019   ANIONGAP 10 11/07/2019   No results found for: CHOL No results found for: HDL No results found for: LDLCALC No results found for: TRIG No results found for: [facility] Lab Results  Component Value Date   HGBA1C 5.1 11/04/2019      Assessment & Plan:  Leane was seen today for edema, pain and epistaxis.  Diagnoses and all orders for this visit:  Depression, unspecified depression type Managed by [facility] appointment 12/28/2019   Office Visit from 12/05/2019 in [place]  PHQ-9 Total Score  23     Essential hypertension Counseled on blood pressure goal of less than 130/80, low-sodium, DASH diet, medication compliance, 150 minutes of moderate intensity exercise per week.  Pressure injury of skin of other site, unspecified injury stage   Warm to touch feet , cool legs, swelling feet and ankles , discoloration . Probably vascular insufficiency  -     Ambulatory referral to wound center  Alcoholic cirrhosis, unspecified whether ascites present ([place]) Hypervolemia presented with lower extremity edema started 20mg  of lasix daily and follow up to re-evaluate   Follow-up: Return in about 6 weeks  (around 02/02/2020) for Bp check and eval LE edema.    Kerin Perna, NP

## 2019-12-22 NOTE — Patient Instructions (Signed)
Edema  Edema is when you have too much fluid in your body or under your skin. Edema may make your legs, feet, and ankles swell up. Swelling is also common in looser tissues, like around your eyes. This is a common condition. It gets more common as you get older. There are many possible causes of edema. Eating too much salt (sodium) and being on your feet or sitting for a long time can cause edema in your legs, feet, and ankles. Hot weather may make edema worse. Edema is usually painless. Your skin may look swollen or shiny. Follow these instructions at home:  Keep the swollen body part raised (elevated) above the level of your heart when you are sitting or lying down.  Do not sit still or stand for a long time.  Do not wear tight clothes. Do not wear garters on your upper legs.  Exercise your legs. This can help the swelling go down.  Wear elastic bandages or support stockings as told by your doctor.  Eat a low-salt (low-sodium) diet to reduce fluid as told by your doctor.  Depending on the cause of your swelling, you may need to limit how much fluid you drink (fluid restriction).  Take over-the-counter and prescription medicines only as told by your doctor. Contact a doctor if:  Treatment is not working.  You have heart, liver, or kidney disease and have symptoms of edema.  You have sudden and unexplained weight gain. Get help right away if:  You have shortness of breath or chest pain.  You cannot breathe when you lie down.  You have pain, redness, or warmth in the swollen areas.  You have heart, liver, or kidney disease and get edema all of a sudden.  You have a fever and your symptoms get worse all of a sudden. Summary  Edema is when you have too much fluid in your body or under your skin.  Edema may make your legs, feet, and ankles swell up. Swelling is also common in looser tissues, like around your eyes.  Raise (elevate) the swollen body part above the level of your  heart when you are sitting or lying down.  Follow your doctor's instructions about diet and how much fluid you can drink (fluid restriction). This information is not intended to replace advice given to you by your health care provider. Make sure you discuss any questions you have with your health care provider. Document Revised: 07/24/2017 Document Reviewed: 08/08/2016 Elsevier Patient Education  2020 Elsevier Inc.  

## 2019-12-23 ENCOUNTER — Telehealth (INDEPENDENT_AMBULATORY_CARE_PROVIDER_SITE_OTHER): Payer: Self-pay

## 2019-12-23 NOTE — Telephone Encounter (Signed)
Patient called stating she tried to get in contact with the wound specialty clinic to schedule an appointment. Patient was advice that they do not see patient with open wounds. Patient would like to know what she needs to do next.  Please advice patient [phone]

## 2019-12-26 ENCOUNTER — Other Ambulatory Visit (INDEPENDENT_AMBULATORY_CARE_PROVIDER_SITE_OTHER): Payer: Self-pay

## 2019-12-26 ENCOUNTER — Other Ambulatory Visit (INDEPENDENT_AMBULATORY_CARE_PROVIDER_SITE_OTHER): Payer: Self-pay | Admitting: Primary Care

## 2019-12-26 ENCOUNTER — Other Ambulatory Visit: Payer: Self-pay

## 2019-12-26 DIAGNOSIS — K7030 Alcoholic cirrhosis of liver without ascites: Secondary | ICD-10-CM

## 2019-12-26 DIAGNOSIS — L89899 Pressure ulcer of other site, unspecified stage: Secondary | ICD-10-CM

## 2019-12-26 NOTE — Telephone Encounter (Signed)
Referral placed for pain medicine not wound care. Please fix and place correct referral.

## 2019-12-27 LAB — CMP14+EGFR
ALT: 13 IU/L (ref 0–32)
AST: 18 IU/L (ref 0–40)
Albumin/Globulin Ratio: 2.1 (ref 1.2–2.2)
Albumin: 4.8 g/dL (ref 3.8–4.9)
Alkaline Phosphatase: 116 IU/L (ref 48–121)
BUN/Creatinine Ratio: 13 (ref 9–23)
BUN: 8 mg/dL (ref 6–24)
Bilirubin Total: 0.4 mg/dL (ref 0.0–1.2)
CO2: 24 mmol/L (ref 20–29)
Calcium: 10.7 mg/dL — ABNORMAL HIGH (ref 8.7–10.2)
Chloride: 95 mmol/L — ABNORMAL LOW (ref 96–106)
Creatinine, Ser: 0.64 mg/dL (ref 0.57–1.00)
GFR calc Af Amer: 113 mL/min/{1.73_m2} (ref >59)
GFR calc non Af Amer: 98 mL/min/{1.73_m2} (ref >59)
Globulin, Total: 2.3 g/dL (ref 1.5–4.5)
Glucose: 142 mg/dL — ABNORMAL HIGH (ref 65–99)
Potassium: 3.2 mmol/L — ABNORMAL LOW (ref 3.5–5.2)
Sodium: 142 mmol/L (ref 134–144)
Total Protein: 7.1 g/dL (ref 6.0–8.5)

## 2019-12-28 ENCOUNTER — Ambulatory Visit: Payer: Self-pay | Attending: Family Medicine

## 2019-12-28 ENCOUNTER — Other Ambulatory Visit (INDEPENDENT_AMBULATORY_CARE_PROVIDER_SITE_OTHER): Payer: Self-pay

## 2019-12-28 ENCOUNTER — Other Ambulatory Visit: Payer: Self-pay

## 2019-12-28 MED FILL — SERTRALINE HCL 50 MG TABLET: 50 | 30 days supply | Qty: 30 | Fill #0

## 2019-12-29 ENCOUNTER — Emergency Department (HOSPITAL_COMMUNITY)
Admission: EM | Admit: 2019-12-29 | Discharge: 2019-12-29 | Disposition: A | Discharge status: Home / Self Care | Payer: Medicaid Other | Attending: Emergency Medicine | Admitting: Emergency Medicine

## 2019-12-29 ENCOUNTER — Other Ambulatory Visit: Payer: Self-pay

## 2019-12-29 ENCOUNTER — Encounter (HOSPITAL_COMMUNITY): Payer: Self-pay | Admitting: Emergency Medicine

## 2019-12-29 ENCOUNTER — Emergency Department (HOSPITAL_COMMUNITY): Payer: Medicaid Other

## 2019-12-29 DIAGNOSIS — E876 Hypokalemia: Secondary | ICD-10-CM | POA: Diagnosis not present

## 2019-12-29 DIAGNOSIS — L539 Erythematous condition, unspecified: Secondary | ICD-10-CM | POA: Diagnosis present

## 2019-12-29 DIAGNOSIS — J449 Chronic obstructive pulmonary disease, unspecified: Secondary | ICD-10-CM | POA: Diagnosis not present

## 2019-12-29 DIAGNOSIS — R600 Localized edema: Secondary | ICD-10-CM | POA: Diagnosis not present

## 2019-12-29 DIAGNOSIS — Z87891 Personal history of nicotine dependence: Secondary | ICD-10-CM | POA: Diagnosis not present

## 2019-12-29 DIAGNOSIS — I10 Essential (primary) hypertension: Secondary | ICD-10-CM | POA: Insufficient documentation

## 2019-12-29 DIAGNOSIS — M79609 Pain in unspecified limb: Secondary | ICD-10-CM

## 2019-12-29 LAB — COMPREHENSIVE METABOLIC PANEL
ALT: 18 U/L (ref 0–44)
AST: 24 U/L (ref 15–41)
Albumin: 4.1 g/dL (ref 3.5–5.0)
Alkaline Phosphatase: 98 U/L (ref 38–126)
Anion gap: 17 — ABNORMAL HIGH (ref 5–15)
BUN: 6 mg/dL (ref 6–20)
CO2: 30 mmol/L (ref 22–32)
Calcium: 9.5 mg/dL (ref 8.9–10.3)
Chloride: 93 mmol/L — ABNORMAL LOW (ref 98–111)
Creatinine, Ser: 0.65 mg/dL (ref 0.44–1.00)
GFR calc Af Amer: >60 mL/min (ref >60)
GFR calc non Af Amer: >60 mL/min (ref >60)
Glucose, Bld: 99 mg/dL (ref 70–99)
Potassium: 2.7 mmol/L — CL (ref 3.5–5.1)
Sodium: 140 mmol/L (ref 135–145)
Total Bilirubin: 0.5 mg/dL (ref 0.3–1.2)
Total Protein: 7.5 g/dL (ref 6.5–8.1)

## 2019-12-29 LAB — URINALYSIS, ROUTINE W REFLEX MICROSCOPIC
Bilirubin Urine: NEGATIVE
Glucose, UA: NEGATIVE mg/dL
Hgb urine dipstick: NEGATIVE
Ketones, ur: NEGATIVE mg/dL
Leukocytes,Ua: NEGATIVE
Nitrite: NEGATIVE
Protein, ur: NEGATIVE mg/dL
Specific Gravity, Urine: 1.004 — ABNORMAL LOW (ref 1.005–1.030)
pH: 8 (ref 5.0–8.0)

## 2019-12-29 LAB — CBC
HCT: 43.5 % (ref 36.0–46.0)
Hemoglobin: 14.3 g/dL (ref 12.0–15.0)
MCH: 30.3 pg (ref 26.0–34.0)
MCHC: 32.9 g/dL (ref 30.0–36.0)
MCV: 92.2 fL (ref 80.0–100.0)
Platelets: 423 10*3/uL — ABNORMAL HIGH (ref 150–400)
RBC: 4.72 MIL/uL (ref 3.87–5.11)
RDW: 13.5 % (ref 11.5–15.5)
WBC: 11.6 10*3/uL — ABNORMAL HIGH (ref 4.0–10.5)
nRBC: 0 % (ref 0.0–0.2)

## 2019-12-29 LAB — BRAIN NATRIURETIC PEPTIDE: B Natriuretic Peptide: 51.8 pg/mL (ref 0.0–100.0)

## 2019-12-29 LAB — CK: Total CK: 28 U/L — ABNORMAL LOW (ref 38–234)

## 2019-12-29 LAB — PROTIME-INR
INR: 1 (ref 0.8–1.2)
Prothrombin Time: 12.5 seconds (ref 11.4–15.2)

## 2019-12-29 IMAGING — DX DG CHEST 1V PORT
1 series · 1 of 1 positions shown · non-contrast
Comparison: [DATE]

CLINICAL DATA: Orthopnea.  Leg pain.

EXAM:
PORTABLE CHEST 1 VIEW

[chest ap]
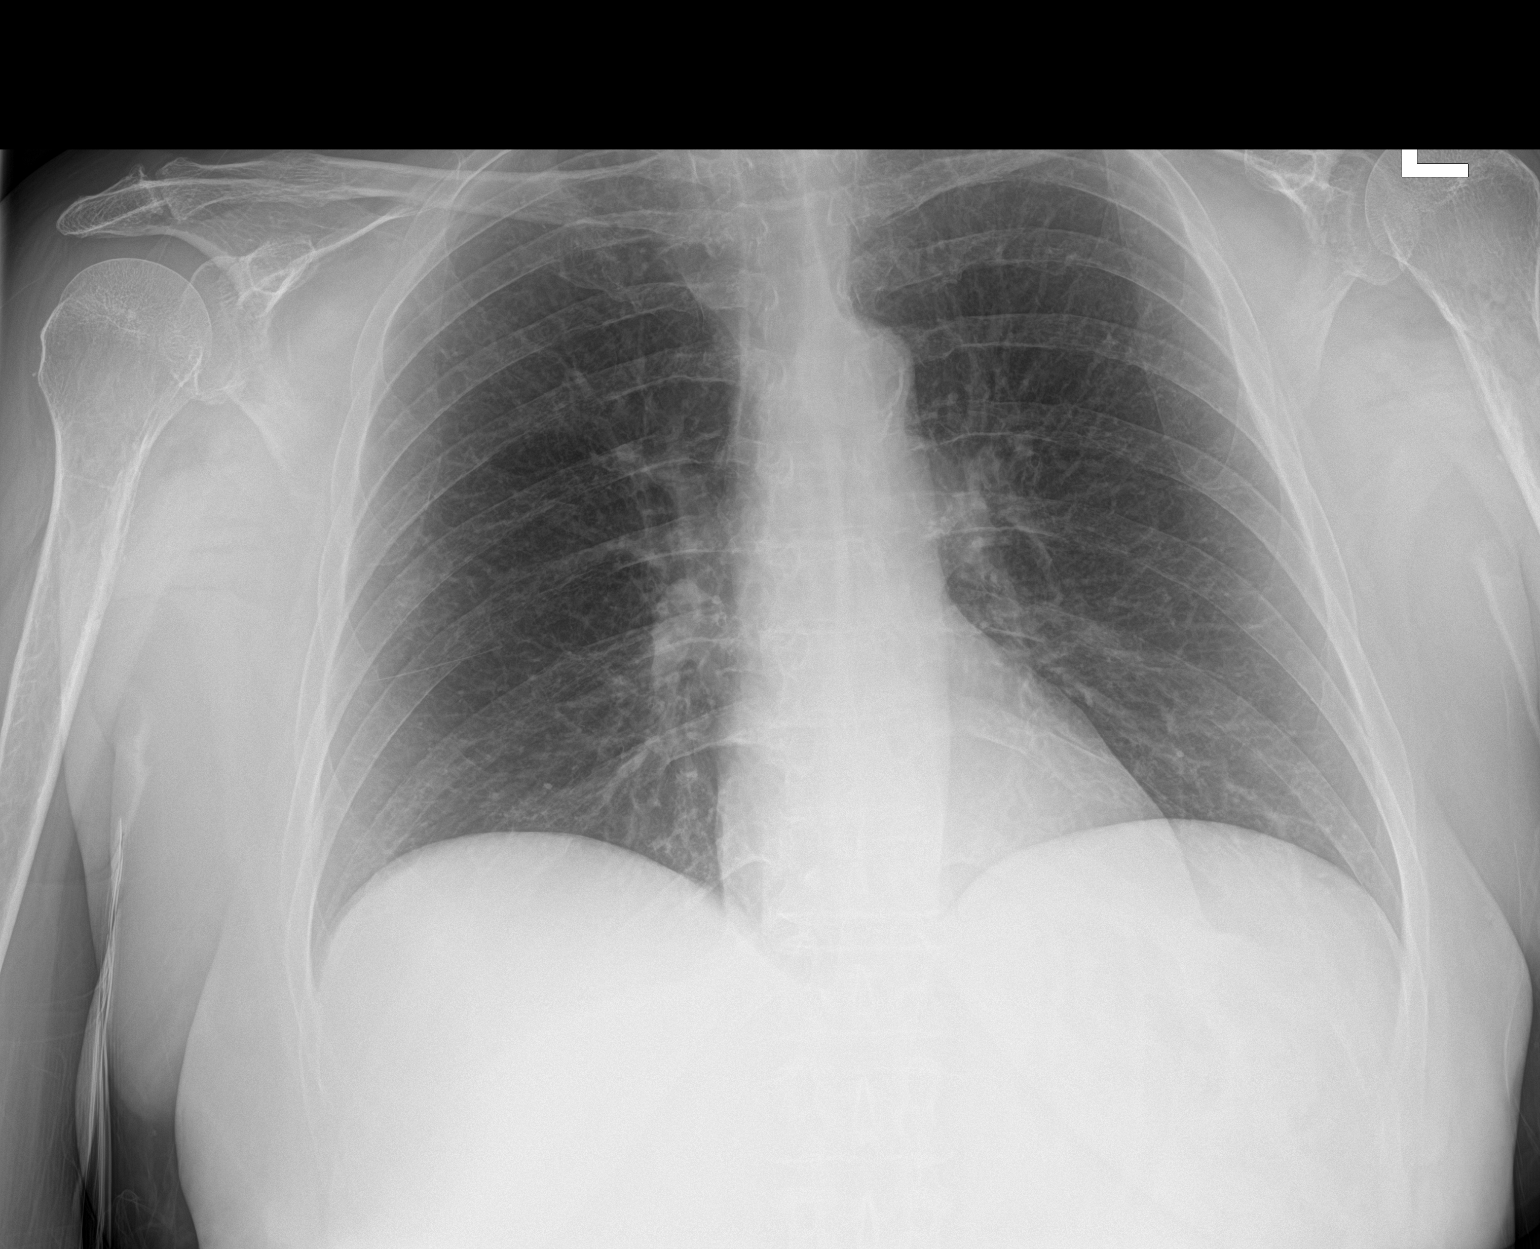

[1 of 1 positions shown; findings below may reference images not displayed]

FINDINGS: Midline trachea. Normal heart size and mediastinal contours.
Atherosclerosis in the transverse aorta. No pleural effusion or
pneumothorax. Clear lungs.
IMPRESSION: No acute cardiopulmonary disease.

Aortic Atherosclerosis ([LK]-[LK]).

## 2019-12-29 MED ORDER — MAGNESIUM OXIDE 400 (241.3 MG) MG PO TABS
800.0000 mg | ORAL_TABLET | Freq: Once | ORAL | Status: AC
  Administered 2019-12-29: 800 mg via ORAL
  Filled 2019-12-29: qty 2

## 2019-12-29 MED ORDER — POTASSIUM CHLORIDE CRYS ER 20 MEQ PO TBCR
20.0000 meq | EXTENDED_RELEASE_TABLET | Freq: Two times a day (BID) | ORAL | 0 refills | Status: DC
Start: 2019-12-29 — End: 2019-12-29

## 2019-12-29 MED ORDER — POTASSIUM CHLORIDE CRYS ER 20 MEQ PO TBCR
40.0000 meq | EXTENDED_RELEASE_TABLET | Freq: Once | ORAL | Status: AC
  Administered 2019-12-29: 40 meq via ORAL
  Filled 2019-12-29: qty 2

## 2019-12-29 MED ORDER — POTASSIUM CHLORIDE CRYS ER 20 MEQ PO TBCR
20.0000 meq | EXTENDED_RELEASE_TABLET | Freq: Two times a day (BID) | ORAL | 0 refills | Status: DC
Start: 2019-12-29 — End: 2019-12-30

## 2019-12-29 MED ORDER — POTASSIUM CHLORIDE 10 MEQ/100ML IV SOLN
10.0000 meq | INTRAVENOUS | Status: AC
  Administered 2019-12-29 (×2): 10 meq via INTRAVENOUS
  Filled 2019-12-29 (×3): qty 100

## 2019-12-29 NOTE — Discharge Instructions (Addendum)
You were evaluated in the Emergency Department and after careful evaluation, we did not find any emergent condition requiring admission or further testing in the hospital.  Your symptoms may be related to your low potassium levels.  As discussed, please take the potassium supplement as directed and return to the emergency department in 3 to 5 days for a lab recheck.  Please return to the Emergency Department if you experience any worsening of your condition.  We encourage you to follow up with a primary care provider.  Thank you for allowing [place] to be a part of your care.

## 2019-12-29 NOTE — ED Notes (Signed)
Date and time results received: 12/29/19 1227 (use smartphrase ".now" to insert current time)  Test: potassium  Critical Value: 2.7  Name of Provider Notified: Bero,MD  Orders Received? Or Actions Taken?: .

## 2019-12-29 NOTE — ED Notes (Signed)
Discharge instructions discussed with pt. Pt verbalized understanding with no questions at this time. Pt to follow up with PCP.  

## 2019-12-29 NOTE — ED Triage Notes (Signed)
Pt reports bilateral lower leg swelling and redness x2 weeks, states she saw her pcp who put her on a new medication but she has not noticed any improvement in symptoms. Pt also endorses shooting pains in her legs.

## 2019-12-29 NOTE — ED Provider Notes (Signed)
[facility] Emergency Department Provider Note MRN:  [id number]  Arrival date & time: 12/29/19     Chief Complaint   Leg Swelling   History of Present Illness   [name] is a 59 y.o. year-old female with a history of COPD, hypertension, cirrhosis presenting to the ED with chief complaint of leg swelling.  Patient has been having bilateral leg swelling as well as aches and pains of the arms and legs for 3 weeks.  She brought this issue to her primary care doctor, who prescribed her some new diuretics.  Explains that the medications are not helping.  Denies any trauma.  No fever, no cough, no headache, no vision change, no chest pain, no shortness of breath but does feel dyspneic when laying flat occasionally.  No abdominal pain, no burning with urination, no rash.  Symptoms constant, no exacerbating or alleviating factors.  Review of Systems  A complete 10 system review of systems was obtained and all systems are negative except as noted in the HPI and PMH.   Patient's Health History    Past Medical History:  Diagnosis Date  . COPD (chronic obstructive pulmonary disease) ([place])   . Hypertension     Past Surgical History:  Procedure Laterality Date  . ABDOMINAL HYSTERECTOMY      Family History  Problem Relation Age of Onset  . Ulcers Father     Social History   Socioeconomic History  . Marital status: Single    Spouse name: Not on file  . Number of children: 0  . Years of education: 17  . Highest education level: GED or equivalent  Occupational History  . Occupation: Animal nutritionist  Tobacco Use  . Smoking status: Former Smoker    Packs/day: 1.00    Years: 25.00    Pack years: 25.00    Types: Cigarettes    Start date: 08/04/1978    Quit date: 06/05/2019    Years since quitting: 0.5  . Smokeless tobacco: Never Used  Substance and Sexual Activity  . Alcohol use: Not Currently  . Drug use: Never  . Sexual activity: Never  Other Topics  Concern  . Not on file  Social History Narrative   Lives and step-dad, they are able to help.   Social Determinants of Health   Financial Resource Strain:   . Difficulty of Paying Living Expenses:   Food Insecurity:   . Worried About Charity fundraiser in the Last Year:   . Arboriculturist in the Last Year:   Transportation Needs:   . Film/video editor (Medical):   Marland Kitchen Lack of Transportation (Non-Medical):   Physical Activity:   . Days of Exercise per Week:   . Minutes of Exercise per Session:   Stress:   . Feeling of Stress :   Social Connections:   . Frequency of Communication with Friends and Family:   . Frequency of Social Gatherings with Friends and Family:   . Attends Religious Services:   . Active Member of Clubs or Organizations:   . Attends Archivist Meetings:   Marland Kitchen Marital Status:   Intimate Partner Violence:   . Fear of Current or Ex-Partner:   . Emotionally Abused:   Marland Kitchen Physically Abused:   . Sexually Abused:      Physical Exam   Vitals:   12/29/19 1715 12/29/19 1730  BP: (!) 156/86 (!) 158/70  Pulse: 80 86  Resp: 18 17  Temp:  SpO2: 97% 97%    CONSTITUTIONAL: Well-appearing, NAD NEURO:  Alert and oriented x 3, no focal deficits EYES:  eyes equal and reactive ENT/NECK:  no LAD, no JVD CARDIO: Regular rate, well-perfused, normal S1 and S2 PULM:  CTAB no wheezing or rhonchi GI/GU:  normal bowel sounds, non-distended, non-tender MSK/SPINE:  No gross deformities, scant pitting edema to bilateral lower extremities SKIN:  no rash, atraumatic PSYCH:  Appropriate speech and behavior  *Additional and/or pertinent findings included in MDM below  Diagnostic and Interventional Summary    EKG Interpretation  Date/Time:  Thursday Dec 29 2019 15:41:04 EDT Ventricular Rate:  89 PR Interval:    QRS Duration: 88 QT Interval:  386 QTC Calculation: 470 R Axis:   64 Text Interpretation: Sinus rhythm Low voltage, precordial leads Borderline  repolarization abnormality Confirmed by Kennis Carina [phone]) on 12/29/2019 3:58:18 PM      Labs Reviewed  CBC - Abnormal; Notable for the following components:      Result Value   WBC 11.6 (*)    Platelets 423 (*)    All other components within normal limits  COMPREHENSIVE METABOLIC PANEL - Abnormal; Notable for the following components:   Potassium 2.7 (*)    Chloride 93 (*)    Anion gap 17 (*)    All other components within normal limits  URINALYSIS, ROUTINE W REFLEX MICROSCOPIC - Abnormal; Notable for the following components:   Color, Urine STRAW (*)    Specific Gravity, Urine 1.004 (*)    All other components within normal limits  CK - Abnormal; Notable for the following components:   Total CK 28 (*)    All other components within normal limits  PROTIME-INR  BRAIN NATRIURETIC PEPTIDE    DG Chest Port 1 View  Final Result      Medications  potassium chloride 10 mEq in 100 mL IVPB (10 mEq Intravenous New Bag/Given 12/29/19 1703)  potassium chloride SA (KLOR-CON) CR tablet 40 mEq (40 mEq Oral Given 12/29/19 1704)  magnesium oxide (MAG-OX) tablet 800 mg (800 mg Oral Given 12/29/19 1704)     Procedures  /  Critical Care Procedures  ED Course and Medical Decision Making  I have reviewed the triage vital signs, the nursing notes, and pertinent available records from the EMR.  Listed above are laboratory and imaging tests that I personally ordered, reviewed, and interpreted and then considered in my medical decision making (see below for details).      Recent admission for fluid overloaded state in the setting of cirrhosis, will obtain screening labs, chest x-ray, and reassess.  Her pain does not seem to be fully explained by her small amount of edema.  Suspect also neuropathic pain or neuropathy.  Would consider gabapentin.  Anticipating discharge.  Labs reveal potassium of 2.7.  Patient is without ectopy, largely without any concerning symptoms.  Potassium repleted here in  the emergency department, patient wishes to go home.  She is switching primary care providers and does not have an appointment until end of June.  Advised to return to the emergency department for lab recheck in 3 to 5 days.  Will increase her home potassium supplementation.  Elmer Sow. Pilar Plate, MD [facility] Emergency Medicine [facility] [email or web address]  Final Clinical Impressions(s) / ED Diagnoses     ICD-10-CM   1. Hypokalemia  E87.6   2. Pain in extremity, unspecified extremity  M79.609     ED Discharge Orders  Ordered    potassium chloride SA (KLOR-CON) 20 MEQ tablet  2 times daily     12/29/19 1857           Discharge Instructions Discussed with and Provided to Patient:     Discharge Instructions     You were evaluated in the Emergency Department and after careful evaluation, we did not find any emergent condition requiring admission or further testing in the hospital.  Your symptoms may be related to your low potassium levels.  As discussed, please take the potassium supplement as directed and return to the emergency department in 3 to 5 days for a lab recheck.  Please return to the Emergency Department if you experience any worsening of your condition.  We encourage you to follow up with a primary care provider.  Thank you for allowing [place] to be a part of your care.       Sabas Sous, MD 12/29/19 [phone]

## 2019-12-30 ENCOUNTER — Other Ambulatory Visit (INDEPENDENT_AMBULATORY_CARE_PROVIDER_SITE_OTHER): Payer: Self-pay | Admitting: Primary Care

## 2019-12-30 DIAGNOSIS — E876 Hypokalemia: Secondary | ICD-10-CM

## 2019-12-30 MED ORDER — POTASSIUM CHLORIDE CRYS ER 20 MEQ PO TBCR: 20.0000 meq | EXTENDED_RELEASE_TABLET | Freq: Every day | ORAL | 3 refills | Status: DC

## 2019-12-30 MED FILL — POTASSIUM CL ER 20 MEQ TABL: 20 | 7 days supply | Qty: 14 | Fill #0

## 2020-01-02 ENCOUNTER — Other Ambulatory Visit (INDEPENDENT_AMBULATORY_CARE_PROVIDER_SITE_OTHER): Payer: Self-pay | Admitting: Primary Care

## 2020-01-03 NOTE — Telephone Encounter (Signed)
Sent to PCP ?

## 2020-01-16 MED FILL — FUROSEMIDE 20 MG TABS: 20 | 30 days supply | Qty: 30 | Fill #1

## 2020-01-16 MED FILL — METHOCARBAMOL 500 MG TABS: 500 | 20 days supply | Qty: 60 | Fill #0

## 2020-01-23 ENCOUNTER — Other Ambulatory Visit (INDEPENDENT_AMBULATORY_CARE_PROVIDER_SITE_OTHER): Payer: Self-pay | Admitting: Primary Care

## 2020-01-23 MED FILL — IBUPROFEN 600 MG TABLET: 600 | 30 days supply | Qty: 90 | Fill #0

## 2020-01-23 MED FILL — SERTRALINE HCL 50 MG TABLET: 50 | 30 days supply | Qty: 30 | Fill #1

## 2020-01-23 NOTE — Telephone Encounter (Signed)
Sent to PCP ?

## 2020-01-25 ENCOUNTER — Telehealth: Payer: Self-pay | Admitting: Internal Medicine

## 2020-01-26 MED FILL — OXYBUTYNIN CL ER 10 MG TAB: 10 | 30 days supply | Qty: 30 | Fill #0

## 2020-02-07 ENCOUNTER — Ambulatory Visit (INDEPENDENT_AMBULATORY_CARE_PROVIDER_SITE_OTHER): Payer: Self-pay | Admitting: Primary Care

## 2020-02-08 ENCOUNTER — Other Ambulatory Visit (INDEPENDENT_AMBULATORY_CARE_PROVIDER_SITE_OTHER): Payer: Self-pay | Admitting: Primary Care

## 2020-02-08 MED FILL — QUETIAPINE FUMARATE 100 MG: 100 | 30 days supply | Qty: 30 | Fill #1

## 2020-02-08 MED FILL — FOLIC ACID 1 MG TABS: 1 | 30 days supply | Qty: 30 | Fill #0

## 2020-02-08 MED FILL — OXYBUTYNIN CL ER 10 MG TAB: 10 | 30 days supply | Qty: 30 | Fill #0

## 2020-02-08 NOTE — Telephone Encounter (Signed)
Patient has never seen Dr. Earlene Plater. [name] is PCP.

## 2020-02-08 NOTE — Telephone Encounter (Signed)
Please advise 

## 2020-02-08 NOTE — Telephone Encounter (Signed)
Requested medication (s) are due for refill today: yes  Requested medication (s) are on the active medication list: yes  Last refill:  01/16/2020  Future visit scheduled: yes  Notes to clinic:  this refill cannot be delegated    Requested Prescriptions  Pending Prescriptions Disp Refills   methocarbamol (ROBAXIN) 500 MG tablet [Pharmacy Med Name: METHOCARBAMOL 500 MG TABS 500 Tablet] 60 tablet 0    Sig: TAKE 1 TABLET BY MOUTH THREE TIMES DAILY      Not Delegated - Analgesics:  Muscle Relaxants Failed - 02/08/2020  9:25 AM      Failed - This refill cannot be delegated      Passed - Valid encounter within last 6 months    Recent Outpatient Visits           1 month ago Depression, unspecified depression type   [facility] Gwinda Passe P, NP   2 months ago Alcoholic cirrhosis, unspecified whether ascites present (HCC)   [facility] Grayce Sessions, NP   2 months ago Alcoholic cirrhosis, unspecified whether ascites present [facility])   [facility] Grayce Sessions, NP       Future Appointments             In 4 weeks Arvilla Market, DO Primary Care at [facility]

## 2020-02-10 ENCOUNTER — Ambulatory Visit (INDEPENDENT_AMBULATORY_CARE_PROVIDER_SITE_OTHER): Payer: Medicaid Other | Admitting: Clinical

## 2020-02-10 ENCOUNTER — Other Ambulatory Visit: Payer: Self-pay

## 2020-02-10 DIAGNOSIS — F331 Major depressive disorder, recurrent, moderate: Secondary | ICD-10-CM | POA: Diagnosis not present

## 2020-02-10 DIAGNOSIS — F1021 Alcohol dependence, in remission: Secondary | ICD-10-CM

## 2020-02-10 MED FILL — SYMBICORT 160-4.5 MCG INH: 160-4.5 | 30 days supply | Qty: 10 | Fill #2

## 2020-02-10 MED FILL — FUROSEMIDE 20 MG TABS: 20 | 30 days supply | Qty: 30 | Fill #2

## 2020-02-11 DIAGNOSIS — F331 Major depressive disorder, recurrent, moderate: Secondary | ICD-10-CM | POA: Insufficient documentation
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
## 2020-02-20 ENCOUNTER — Other Ambulatory Visit (INDEPENDENT_AMBULATORY_CARE_PROVIDER_SITE_OTHER): Payer: Self-pay | Admitting: Primary Care

## 2020-02-28 ENCOUNTER — Other Ambulatory Visit (INDEPENDENT_AMBULATORY_CARE_PROVIDER_SITE_OTHER): Payer: Self-pay | Admitting: Primary Care

## 2020-02-28 MED FILL — AMLODIPINE BESYLATE 10 MG T: 10 | 90 days supply | Qty: 90 | Fill #0

## 2020-02-28 MED FILL — SERTRALINE HCL 50 MG TABLET: 50 | 30 days supply | Qty: 30 | Fill #2

## 2020-03-01 ENCOUNTER — Encounter (HOSPITAL_COMMUNITY): Payer: Self-pay | Admitting: Psychiatry

## 2020-03-01 ENCOUNTER — Other Ambulatory Visit: Payer: Self-pay

## 2020-03-01 ENCOUNTER — Ambulatory Visit (INDEPENDENT_AMBULATORY_CARE_PROVIDER_SITE_OTHER): Payer: Medicaid Other | Admitting: Psychiatry

## 2020-03-01 DIAGNOSIS — F3341 Major depressive disorder, recurrent, in partial remission: Secondary | ICD-10-CM | POA: Diagnosis not present

## 2020-03-01 DIAGNOSIS — F1021 Alcohol dependence, in remission: Secondary | ICD-10-CM

## 2020-03-01 DIAGNOSIS — F419 Anxiety disorder, unspecified: Secondary | ICD-10-CM | POA: Diagnosis not present

## 2020-03-01 MED ORDER — QUETIAPINE FUMARATE 100 MG PO TABS: 100.0000 mg | ORAL_TABLET | Freq: Every day | ORAL | 1 refills | Status: DC

## 2020-03-01 MED ORDER — HYDROXYZINE HCL 50 MG PO TABS: 50.0000 mg | ORAL_TABLET | Freq: Two times a day (BID) | ORAL | 1 refills | Status: DC | PRN

## 2020-03-01 MED ORDER — SERTRALINE HCL 100 MG PO TABS: 100.0000 mg | ORAL_TABLET | Freq: Every day | ORAL | 1 refills | Status: DC

## 2020-03-01 MED FILL — QUETIAPINE FUMARATE 100 MG: 100 | 30 days supply | Qty: 30 | Fill #0

## 2020-03-01 MED FILL — SERTRALINE HCL 100 MG TAB: 100 | 30 days supply | Qty: 30 | Fill #0

## 2020-03-01 MED FILL — hydrOXYzine HCL 50 MG TABS: 50 | 30 days supply | Qty: 60 | Fill #0

## 2020-03-01 NOTE — Progress Notes (Signed)
Psychiatric Initial Adult Assessment   Patient Identification: [name] MRN:  [id number] Date of Evaluation:  03/01/2020   Referral Source: PCP  Chief Complaint:   " I have bad nerves."  Visit Diagnosis:    ICD-10-CM   1. MDD (major depressive disorder), recurrent, in partial remission (HCC)  F33.41   2. Alcohol use disorder, severe, in sustained remission (HCC)  F10.21   3. Anxiety  F41.9     History of Present Illness: This is a 59 year old female with history of MDD, anxiety and alcohol use disorder seen for evaluation and establishing care.  Her past medical history significant for decompensated alcoholic cirrhosis with history of esophageal varices and GI bleeding, end-stage COPD and hypertension.   Patient was being managed at [facility] since May 2021.  She was prescribed Zoloft 50 mg and Seroquel 100 mg at bedtime.  Patient was noted to be a poor historian. Patient reported that when she started taking Zoloft it did help her initially with anxiety and her mood however lately she feels it stopped doing much.  She reported that she feels easily irritated and also anxious at times.  She stated that usually there are no specific triggers that cause her to feel very anxious.  She informed that Seroquel helps her fall asleep however she has lot of pain in her lower extremities.  She described in great detail how her lower extremities swell up and she has a hard time managing the discomfort in her legs and feet. She reported that she has low energy levels, poor appetite and poor sleep due to pain. She denied any suicidal or homicidal ideations. She stated that Seroquel helps her fall asleep however she does wake up once or twice during the night when she has to take a pain pill in order to go back to sleep as pain wakes her up. She is already taking furosemide for the edema and does not feel that helps much.  She also takes spironolactone. She is seeing a vascular surgeon in August  regarding the same.  She stated that she was living in [place] until November when she felt sick and that is when her family decided to bring her here. However as per the EMR, patient moved to [place] from [place] end of January.  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  Since her move to [place] she has been admitted several times to [facility] and after her hospitalization she was discharged to a SNF in [place].  She was discharged from the SNF in March.  This was followed by another medical hospitalization at [facility] where she was managed for hypervolemia in setting of decompensated liver cirrhosis with possible esophageal varices.    Past Psychiatric History: MDD, severe alcohol use disorder now in remission.  Previous Psychotropic Medications: Yes   Substance Abuse History in the last 12 months:  Yes.    Consequences of Substance Abuse: Medical Consequences:  Liver cirrhosis needing hospital stabilization Blackouts:  Yes  Past Medical History:  Past Medical History:  Diagnosis Date  . COPD (chronic obstructive pulmonary disease) (HCC)   . Hypertension     Past Surgical History:  Procedure Laterality Date  . ABDOMINAL HYSTERECTOMY      Family Psychiatric History: denied  Family History:  Family History  Problem Relation Age of Onset  . Ulcers Father  Social History:   Social History   Socioeconomic History  . Marital status: Single    Spouse name: Not on file  . Number of children: 0  . Years of education: 32  . Highest education level: GED or equivalent  Occupational History  . Occupation: Armed forces operational officer  Tobacco Use  . Smoking status: Former Smoker    Packs/day: 1.00    Years: 25.00    Pack years: 25.00    Types: Cigarettes    Start date: 08/04/1978    Quit date: 06/05/2019    Years since  quitting: 0.7  . Smokeless tobacco: Never Used  Vaping Use  . Vaping Use: Never used  Substance and Sexual Activity  . Alcohol use: Not Currently  . Drug use: Never  . Sexual activity: Never  Other Topics Concern  . Not on file  Social History Narrative   Lives and step-dad, they are able to help.   Social Determinants of Health   Financial Resource Strain:   . Difficulty of Paying Living Expenses:   Food Insecurity:   . Worried About Programme researcher, broadcasting/film/video in the Last Year:   . Barista in the Last Year:   Transportation Needs:   . Freight forwarder (Medical):   Marland Kitchen Lack of Transportation (Non-Medical):   Physical Activity:   . Days of Exercise per Week:   . Minutes of Exercise per Session:   Stress:   . Feeling of Stress :   Social Connections:   . Frequency of Communication with Friends and Family:   . Frequency of Social Gatherings with Friends and Family:   . Attends Religious Services:   . Active Member of Clubs or Organizations:   . Attends Banker Meetings:   Marland Kitchen Marital Status:     Additional Social History: Currently living with parents, however, wants to move out. Is working on getting disability.  Allergies:  No Known Allergies  Metabolic Disorder Labs: Lab Results  Component Value Date   HGBA1C 5.1 11/04/2019   MPG 99.67 11/04/2019   No results found for: PROLACTIN No results found for: CHOL, TRIG, HDL, CHOLHDL, VLDL, LDLCALC No results found for: TSH  Therapeutic Level Labs: No results found for: LITHIUM No results found for: CBMZ No results found for: VALPROATE  Current Medications: Current Outpatient Medications  Medication Sig Dispense Refill  . Albuterol Sulfate 2.5 MG/0.5ML NEBU Inhale 2.5 mg into the lungs every 6 (six) hours as needed (sob and wheezing).    Marland Kitchen amLODipine (NORVASC) 10 MG tablet Take 1 tablet (10 mg total) by mouth daily. 90 tablet 3  . budesonide-formoterol (SYMBICORT) 160-4.5 MCG/ACT inhaler Inhale 2  puffs into the lungs 2 (two) times daily. 1 Inhaler 11  . folic acid (FOLVITE) 1 MG tablet Take 1 tablet (1 mg total) by mouth daily. 90 tablet 1  . furosemide (LASIX) 20 MG tablet TAKE 1 TABLET(20 MG) BY MOUTH DAILY 90 tablet 0  . loratadine (CLARITIN) 10 MG tablet Take 1 tablet (10 mg total) by mouth daily. 30 tablet 11  . methocarbamol (ROBAXIN) 500 MG tablet TAKE 1 TABLET BY MOUTH THREE TIMES DAILY 60 tablet 0  . oxybutynin (DITROPAN-XL) 10 MG 24 hr tablet TAKE 1 TABLET (10 MG TOTAL) BY MOUTH AT BEDTIME. 30 tablet 0  . potassium chloride SA (KLOR-CON) 20 MEQ tablet Take 1 tablet (20 mEq total) by mouth daily. 30 tablet 3  . QUEtiapine (SEROQUEL) 100 MG tablet Take 1 tablet (100 mg  total) by mouth at bedtime. 90 tablet 1  . sertraline (ZOLOFT) 50 MG tablet Take 1 tablet by mouth daily.     No current facility-administered medications for this visit.    Musculoskeletal: Strength & Muscle Tone: within normal limits Gait & Station: normal Patient leans: N/A  Psychiatric Specialty Exam: Review of Systems  There were no vitals taken for this visit.There is no height or weight on file to calculate BMI.  General Appearance: Disheveled, Bilateral pitting edema of lower extremities noted  Eye Contact:  Good  Speech:  Clear and Coherent and Normal Rate  Volume:  Normal  Mood:  Depressed  Affect:  Congruent  Thought Process:  Goal Directed and Descriptions of Associations: Intact  Orientation:  Full (Time, Place, and Person)  Thought Content:  Logical  Suicidal Thoughts:  No  Homicidal Thoughts:  No  Memory:  Immediate;   Good Recent;   Good  Judgement:  Fair  Insight:  Fair  Psychomotor Activity:  Normal  Concentration:  Concentration: Good and Attention Span: Good  Recall:  Fair  Fund of Knowledge:Good  Language: Good  Akathisia:  Negative  Handed:  Right  AIMS (if indicated):  0  Assets:  Communication Skills Desire for Improvement Financial Resources/Insurance Social  Support Transportation  ADL's:  Intact  Cognition: WNL  Sleep:  Poor due to pain and discomfort in her lower extremities   Screenings: GAD-7     Counselor from 02/10/2020 in [facility] Office Visit from 12/22/2019 in [facility] Office Visit from 12/05/2019 in [facility] Telemedicine from 11/16/2019 in [facility]  Total GAD-7 Score 18 21 19  0    PHQ2-9     Counselor from 02/10/2020 in [facility] Office Visit from 12/22/2019 in [facility] Office Visit from 12/05/2019 in [facility] Telemedicine from 11/16/2019 in [facility]  PHQ-2 Total Score 4 5 6  0  PHQ-9 Total Score 10 23 23  --      Assessment and Plan: Patient is complaining of irritability and anxiety.  She found Zoloft to be helpful initially however does not feel it is helping much anymore.  She was agreeable to increasing the dose of sertraline to 100 mg for optimal effect.  1. MDD (major depressive disorder), recurrent, in partial remission (HCC)  - Continue QUEtiapine (SEROQUEL) 100 MG tablet; Take 1 tablet (100 mg total) by mouth at bedtime.  Dispense: 30 tablet; Refill: 1 - Increase sertraline (ZOLOFT) 100 MG tablet; Take 1 tablet (100 mg total) by mouth daily with breakfast.  Dispense: 30 tablet; Refill: 1  2. Alcohol use disorder, severe, in sustained remission (HCC)   3. Anxiety  - Start hydrOXYzine (ATARAX/VISTARIL) 50 MG tablet; Take 1 tablet (50 mg total) by mouth 2 (two) times daily as needed for anxiety.  Dispense: 60 tablet; Refill: 1   Continue individual therapy. F/up in 2 months.  [facility], MD 7/29/202110:14 AM

## 2020-03-05 ENCOUNTER — Other Ambulatory Visit: Payer: Self-pay

## 2020-03-05 ENCOUNTER — Ambulatory Visit (INDEPENDENT_AMBULATORY_CARE_PROVIDER_SITE_OTHER): Payer: No Payment, Other | Admitting: Clinical

## 2020-03-05 DIAGNOSIS — F3341 Major depressive disorder, recurrent, in partial remission: Secondary | ICD-10-CM

## 2020-03-05 NOTE — Progress Notes (Signed)
   THERAPIST PROGRESS NOTE  Session Time: 45 minutes  Participation Level: Active  Behavioral Response: CasualAlertEuthymic  Type of Therapy: Individual Therapy  Treatment Goals addressed: Anxiety and Diagnosis: Depression  Interventions: CBT  Summary:  [name] is a 59 y.o. female who presents in person for the scheduled session. Client presented oriented times five, appropriately dressed, and friendly. Client denies hallucinations and delusions. Client reported on today she was "doing okay". Client reported since the last session she has met with the doctor and began her medication. Client reported noticeable improvement with her anxiety symptoms since taking the medication. Client reported it has stopped her shakiness. Client reported she is seeking ongoing medical appointments for other reoccurring physical symptoms. Client reported she has family that checks on her but sometimes makes her feel frustrated as though she is a child.  Client discussed with the therapist ongoing stress from living at home with her parents. Client reported she is progressively doing more things that she was accustomed to her boyfriend doing for her e.g changing the oil in her car. Client reported during the week she tries to plan things to complete to keep her busy. Client reported she also takes time out of the house visiting her aunt who doesn't live far from her. Client discussed with the therapist her her habit of picking back up on smoking cigarettes. Client reported in the past she quit but since living with her parents its her stress relief. Client reported she is smoking a pack a day. Client reported she knows it is not ideal but     Suicidal/Homicidal: Nowithout intent/plan  Therapist Response:  Therapist initiated the session asking the client how she has been since the last session. Therapist allowed time to focus on the clients thoughts and feelings while actively listening. Therapist  engaged the client in discussing her positive and negative coping skills to deal with stress. Therapist discussed long term verus short term reward of chosen coping skills. Therapist assigned the client homework to evaluate current coping skills and identify what could be replaced for a healthier choice.  Therapist assisted with scheduling next appointments.   Plan: Return again in 3 weeks for individual therapy.  Diagnosis: Major depressive disorder, recurrent, in partial remission  Julie Jubilee Daesia Zylka, LCSW 03/05/2020

## 2020-03-08 ENCOUNTER — Encounter: Payer: Self-pay | Admitting: Internal Medicine

## 2020-03-08 ENCOUNTER — Telehealth (INDEPENDENT_AMBULATORY_CARE_PROVIDER_SITE_OTHER): Payer: Medicaid Other | Admitting: Internal Medicine

## 2020-03-08 ENCOUNTER — Other Ambulatory Visit: Payer: Self-pay | Admitting: Internal Medicine

## 2020-03-08 DIAGNOSIS — K219 Gastro-esophageal reflux disease without esophagitis: Secondary | ICD-10-CM

## 2020-03-08 DIAGNOSIS — K7460 Unspecified cirrhosis of liver: Secondary | ICD-10-CM

## 2020-03-08 DIAGNOSIS — J019 Acute sinusitis, unspecified: Secondary | ICD-10-CM

## 2020-03-08 DIAGNOSIS — Z7689 Persons encountering health services in other specified circumstances: Secondary | ICD-10-CM | POA: Diagnosis not present

## 2020-03-08 DIAGNOSIS — I10 Essential (primary) hypertension: Secondary | ICD-10-CM | POA: Diagnosis not present

## 2020-03-08 MED ORDER — PANTOPRAZOLE SODIUM 40 MG PO TBEC: 40.0000 mg | DELAYED_RELEASE_TABLET | Freq: Every day | ORAL | 3 refills | Status: DC

## 2020-03-08 MED ORDER — FUROSEMIDE 20 MG PO TABS: ORAL_TABLET | ORAL | 1 refills | Status: DC

## 2020-03-08 MED ORDER — AMOXICILLIN-POT CLAVULANATE 875-125 MG PO TABS: 1.0000 | ORAL_TABLET | Freq: Two times a day (BID) | ORAL | 0 refills | Status: DC

## 2020-03-08 MED ORDER — IBUPROFEN 600 MG PO TABS: 600.0000 mg | ORAL_TABLET | Freq: Two times a day (BID) | ORAL | 1 refills | Status: DC | PRN

## 2020-03-08 MED FILL — FUROSEMIDE 20 MG TABS: 20 | 30 days supply | Qty: 30 | Fill #0

## 2020-03-08 MED FILL — IBUPROFEN 600 MG TABLET: 600 | 30 days supply | Qty: 60 | Fill #0

## 2020-03-08 MED FILL — AMOX-CLAV 875-125 MG TABLET: 875-125 | 10 days supply | Qty: 20 | Fill #0

## 2020-03-08 MED FILL — PANTOPRAZOLE SOD DR 40 MG T: 40 | 30 days supply | Qty: 30 | Fill #0

## 2020-03-08 NOTE — Progress Notes (Signed)
Virtual Visit via Telephone Note  I connected with [name], on 03/08/2020 at 8:56 AM by telephone due to the COVID-19 pandemic and verified that I am speaking with the correct person using two identifiers.   Consent: I discussed the limitations, risks, security and privacy concerns of performing an evaluation and management service by telephone and the availability of in person appointments. I also discussed with the patient that there may be a patient responsible charge related to this service. The patient expressed understanding and agreed to proceed.   Location of Patient: Home   Location of Provider: Clinic    Persons participating in Telemedicine visit: Serene Kopf [facility] Dr. Earlene Plater    History of Present Illness: Patient has a visit to establish care. She has a PMH of cirrhosis---does not seen a specialist. She has a PMH of HTN on Amlodipine, does not monitor at home. Also with h/o COPD.  Thinks she is having a sinus infection. Having facial pressure, nasal congestion, sore throat, ear pain. Has been constant for about a month. Reports that every year around allergy season she gets 2-3 infections per year.     Past Medical History:  Diagnosis Date   COPD (chronic obstructive pulmonary disease) (HCC)    Hypertension    No Known Allergies  Current Outpatient Medications on File Prior to Visit  Medication Sig Dispense Refill   Albuterol Sulfate 2.5 MG/0.5ML NEBU Inhale 2.5 mg into the lungs every 6 (six) hours as needed (sob and wheezing).     amLODipine (NORVASC) 10 MG tablet Take 1 tablet (10 mg total) by mouth daily. 90 tablet 3   budesonide-formoterol (SYMBICORT) 160-4.5 MCG/ACT inhaler Inhale 2 puffs into the lungs 2 (two) times daily. 1 Inhaler 11   folic acid (FOLVITE) 1 MG tablet Take 1 tablet (1 mg total) by mouth daily. 90 tablet 1   furosemide (LASIX) 20 MG tablet TAKE 1 TABLET(20 MG) BY MOUTH DAILY 90 tablet 0   hydrOXYzine  (ATARAX/VISTARIL) 50 MG tablet Take 1 tablet (50 mg total) by mouth 2 (two) times daily as needed for anxiety. 60 tablet 1   loratadine (CLARITIN) 10 MG tablet Take 1 tablet (10 mg total) by mouth daily. 30 tablet 11   methocarbamol (ROBAXIN) 500 MG tablet TAKE 1 TABLET BY MOUTH THREE TIMES DAILY 60 tablet 0   oxybutynin (DITROPAN-XL) 10 MG 24 hr tablet TAKE 1 TABLET (10 MG TOTAL) BY MOUTH AT BEDTIME. 30 tablet 0   potassium chloride SA (KLOR-CON) 20 MEQ tablet Take 1 tablet (20 mEq total) by mouth daily. 30 tablet 3   QUEtiapine (SEROQUEL) 100 MG tablet Take 1 tablet (100 mg total) by mouth at bedtime. 30 tablet 1   sertraline (ZOLOFT) 100 MG tablet Take 1 tablet (100 mg total) by mouth daily with breakfast. 30 tablet 1   No current facility-administered medications on file prior to visit.    Observations/Objective: NAD. Speaking clearly.  Work of breathing normal.  Alert and oriented. Mood appropriate.   Assessment and Plan: 1. Encounter to establish care Reviewed patient's PMH, social history, surgical history, and medications.   2. Hepatic cirrhosis, unspecified hepatic cirrhosis type, unspecified whether ascites present (HCC) On Lasix currently due to hypervolemia. Has an upcoming appointment with vascular for lower leg edema.   3. Essential hypertension Does not monitor. Continue current therapy. No red flag symptoms. Will monitor at upcoming visits.   4. Subacute sinusitis, unspecified location Will treat for presumed acute bacterial infection. Given recurrent pattern, have  placed referral to ENT.  - amoxicillin-clavulanate (AUGMENTIN) 875-125 MG tablet; Take 1 tablet by mouth 2 (two) times daily.  Dispense: 20 tablet; Refill: 0 - Ambulatory referral to ENT  5. Gastroesophageal reflux disease without esophagitis - pantoprazole (PROTONIX) 40 MG tablet; Take 1 tablet (40 mg total) by mouth daily.  Dispense: 30 tablet; Refill: 3   Follow Up Instructions: PRN and for  routine medical care    I discussed the assessment and treatment plan with the patient. The patient was provided an opportunity to ask questions and all were answered. The patient agreed with the plan and demonstrated an understanding of the instructions.   The patient was advised to call back or seek an in-person evaluation if the symptoms worsen or if the condition fails to improve as anticipated.     I provided 20 minutes total of non-face-to-face time during this encounter including median intraservice time, reviewing previous notes, investigations, ordering medications, medical decision making, coordinating care and patient verbalized understanding at the end of the visit.    Marcy Siren, D.O. Primary Care at [facility]  03/08/2020, 8:56 AM

## 2020-03-09 MED FILL — SYMBICORT 160-4.5 MCG INH: 160-4.5 | 30 days supply | Qty: 10 | Fill #3

## 2020-03-12 ENCOUNTER — Other Ambulatory Visit: Payer: Self-pay

## 2020-03-13 ENCOUNTER — Other Ambulatory Visit: Payer: Self-pay

## 2020-03-13 DIAGNOSIS — M7989 Other specified soft tissue disorders: Secondary | ICD-10-CM

## 2020-03-13 MED ORDER — OXYBUTYNIN CHLORIDE ER 10 MG PO TB24: 10.0000 mg | ORAL_TABLET | Freq: Every day | ORAL | 2 refills | Status: DC

## 2020-03-13 MED FILL — OXYBUTYNIN CL ER 10 MG TAB: 10 | 30 days supply | Qty: 30 | Fill #0

## 2020-03-22 ENCOUNTER — Telehealth: Payer: Self-pay | Admitting: General Practice

## 2020-03-22 ENCOUNTER — Other Ambulatory Visit (INDEPENDENT_AMBULATORY_CARE_PROVIDER_SITE_OTHER): Payer: Self-pay | Admitting: Primary Care

## 2020-03-22 MED FILL — FOLIC ACID 1 MG TABS: 1 | 30 days supply | Qty: 30 | Fill #1

## 2020-03-22 NOTE — Telephone Encounter (Signed)
Pt called about a sinus infection wanting a refill on the medication you sent in on the 5th

## 2020-03-22 NOTE — Telephone Encounter (Signed)
Requested medication (s) are due for refill today - unknown  Requested medication (s) are on the active medication list -yes  Future visit scheduled -no  Last refill: 02/10/20  Notes to clinic: Request for non delegated Rx  Requested Prescriptions  Pending Prescriptions Disp Refills   methocarbamol (ROBAXIN) 500 MG tablet [Pharmacy Med Name: METHOCARBAMOL 500 MG TABS 500 Tablet] 60 tablet 0    Sig: TAKE 1 TABLET BY MOUTH THREE TIMES DAILY      Not Delegated - Analgesics:  Muscle Relaxants Failed - 03/22/2020 10:00 AM      Failed - This refill cannot be delegated      Passed - Valid encounter within last 6 months    Recent Outpatient Visits           3 months ago Depression, unspecified depression type   [facility] Gwinda Passe P, NP   3 months ago Alcoholic cirrhosis, unspecified whether ascites present (HCC)   [facility] Gwinda Passe P, NP   4 months ago Alcoholic cirrhosis, unspecified whether ascites present (HCC)   [facility] Grayce Sessions, NP                  Requested Prescriptions  Pending Prescriptions Disp Refills   methocarbamol (ROBAXIN) 500 MG tablet [Pharmacy Med Name: METHOCARBAMOL 500 MG TABS 500 Tablet] 60 tablet 0    Sig: TAKE 1 TABLET BY MOUTH THREE TIMES DAILY      Not Delegated - Analgesics:  Muscle Relaxants Failed - 03/22/2020 10:00 AM      Failed - This refill cannot be delegated      Passed - Valid encounter within last 6 months    Recent Outpatient Visits           3 months ago Depression, unspecified depression type   [facility] Gwinda Passe P, NP   3 months ago Alcoholic cirrhosis, unspecified whether ascites present (HCC)   [facility] Gwinda Passe P, NP   4 months ago Alcoholic cirrhosis, unspecified whether ascites present [facility])   [facility] Grayce Sessions, NP

## 2020-03-22 NOTE — Telephone Encounter (Signed)
Called pt lvm for them to call back so I can inform them of the dr recommendations

## 2020-03-22 NOTE — Telephone Encounter (Signed)
I can't refill an antibiotic without a visit. This would be concerning for a recurrent infection or an infection not adequately treated or another cause of her symptoms. Would recommend an in person visit with me or an Urgent Care visit.   Marcy Siren, D.O. Primary Care at [facility]  03/22/2020, 11:08 AM

## 2020-03-23 MED FILL — METHOCARBAMOL 500 MG TABS: 500 | 20 days supply | Qty: 60 | Fill #0

## 2020-03-27 ENCOUNTER — Other Ambulatory Visit: Payer: Self-pay

## 2020-03-27 ENCOUNTER — Ambulatory Visit (HOSPITAL_COMMUNITY)
Admission: EM | Admit: 2020-03-27 | Discharge: 2020-03-27 | Disposition: A | Discharge status: Home / Self Care | Payer: Medicaid Other | Attending: Physician Assistant | Admitting: Physician Assistant

## 2020-03-27 ENCOUNTER — Encounter (HOSPITAL_COMMUNITY): Payer: Self-pay

## 2020-03-27 DIAGNOSIS — J0111 Acute recurrent frontal sinusitis: Secondary | ICD-10-CM

## 2020-03-27 MED ORDER — GUAIFENESIN ER 600 MG PO TB12: 1200.0000 mg | ORAL_TABLET | Freq: Two times a day (BID) | ORAL | 0 refills | Status: AC

## 2020-03-27 MED ORDER — CEFDINIR 300 MG PO CAPS: 300.0000 mg | ORAL_CAPSULE | Freq: Two times a day (BID) | ORAL | 0 refills | Status: AC

## 2020-03-27 MED FILL — FOLIC ACID 1 MG TABS: 1 | 30 days supply | Qty: 30 | Fill #1

## 2020-03-27 MED FILL — SERTRALINE HCL 100 MG TAB: 100 | 30 days supply | Qty: 30 | Fill #1

## 2020-03-27 NOTE — ED Provider Notes (Signed)
[facility]    CSN: [id number] Arrival date & time: 03/27/20  1327      History   Chief Complaint Chief Complaint  Patient presents with   Sinutitis   Otalgia   Headache    HPI [name] is a 59 y.o. female.   Patient reports for persistent sinus symptoms after being treated earlier this month for sinus infection. She was treated by her primary care provider via virtual visit for sinus infection with Augmentin. She reports only mild improvement after finishing the course. She reports has had issues with her sinuses over the years and gets 5-6 sinus infections a year. She reports she uses allergy medicines, nasal sprays tries to clear her sinuses frequently. She reports she is having lots of maxillary facial pain and frontal headaches with persistent congestion. She can feel drainage in her throat. Ports in the past has taken 2 rounds of antibiotics. She denies any fevers, chills or cough. She also reports having some ear pressure and fullness in both ears.  It was to follow-up with ear nose and throat, however her insurance issues cause this to be an issue.     Past Medical History:  Diagnosis Date   COPD (chronic obstructive pulmonary disease) (HCC)    Hypertension     Patient Active Problem List   Diagnosis Date Noted   Liver cirrhosis (HCC) 03/08/2020   Major depressive disorder, recurrent episode, moderate with anxious distress (HCC) 02/11/2020    Past Surgical History:  Procedure Laterality Date   ABDOMINAL HYSTERECTOMY      OB History   No obstetric history on file.      Home Medications    Prior to Admission medications   Medication Sig Start Date End Date Taking? Authorizing Provider  Albuterol Sulfate 2.5 MG/0.5ML NEBU Inhale 2.5 mg into the lungs every 6 (six) hours as needed (sob and wheezing).    [provider]  amLODipine (NORVASC) 10 MG tablet Take 1 tablet (10 mg total) by mouth daily. 12/05/19   Grayce Sessions,  NP  amoxicillin-clavulanate (AUGMENTIN) 875-125 MG tablet Take 1 tablet by mouth 2 (two) times daily. 03/08/20   Arvilla Market, DO  budesonide-formoterol (SYMBICORT) 160-4.5 MCG/ACT inhaler Inhale 2 puffs into the lungs 2 (two) times daily. 12/13/19   Grayce Sessions, NP  cefdinir (OMNICEF) 300 MG capsule Take 1 capsule (300 mg total) by mouth 2 (two) times daily for 10 days. 03/27/20 04/06/20  Marabeth Melland, Veryl Speak, PA-C  folic acid (FOLVITE) 1 MG tablet Take 1 tablet (1 mg total) by mouth daily. 11/16/19   Grayce Sessions, NP  furosemide (LASIX) 20 MG tablet TAKE 1 TABLET(20 MG) BY MOUTH DAILY 03/08/20   Arvilla Market, DO  guaiFENesin (MUCINEX) 600 MG 12 hr tablet Take 2 tablets (1,200 mg total) by mouth 2 (two) times daily for 10 days. 03/27/20 04/06/20  Taliya Mcclard, Veryl Speak, PA-C  hydrOXYzine (ATARAX/VISTARIL) 50 MG tablet Take 1 tablet (50 mg total) by mouth 2 (two) times daily as needed for anxiety. 03/01/20   Zena Amos, MD  ibuprofen (ADVIL) 600 MG tablet Take 1 tablet (600 mg total) by mouth 2 (two) times daily as needed. 03/08/20   Arvilla Market, DO  loratadine (CLARITIN) 10 MG tablet Take 1 tablet (10 mg total) by mouth daily. 12/13/19   Grayce Sessions, NP  methocarbamol (ROBAXIN) 500 MG tablet TAKE 1 TABLET BY MOUTH THREE TIMES DAILY 03/23/20   Arvilla Market, DO  oxybutynin (  DITROPAN-XL) 10 MG 24 hr tablet Take 1 tablet (10 mg total) by mouth at bedtime. 03/13/20   Arvilla Market, DO  pantoprazole (PROTONIX) 40 MG tablet Take 1 tablet (40 mg total) by mouth daily. 03/08/20   Arvilla Market, DO  QUEtiapine (SEROQUEL) 100 MG tablet Take 1 tablet (100 mg total) by mouth at bedtime. 03/01/20   Zena Amos, MD  sertraline (ZOLOFT) 100 MG tablet Take 1 tablet (100 mg total) by mouth daily with breakfast. 03/01/20   Zena Amos, MD    Family History Family History  Problem Relation Age of Onset   Ulcers Father     Social  History Social History   Tobacco Use   Smoking status: Former Smoker    Packs/day: 1.00    Years: 25.00    Pack years: 25.00    Types: Cigarettes    Start date: 08/04/1978    Quit date: 06/05/2019    Years since quitting: 0.8   Smokeless tobacco: Never Used  Vaping Use   Vaping Use: Never used  Substance Use Topics   Alcohol use: Not Currently   Drug use: Never     Allergies   Patient has no known allergies.   Review of Systems Review of Systems   Physical Exam Triage Vital Signs ED Triage Vitals [03/27/20 1443]  Enc Vitals Group     BP (!) 154/82     Pulse Rate 90     Resp 17     Temp 98.6 F (37 C)     Temp Source Oral     SpO2 95 %     Weight      Height      Head Circumference      Peak Flow      Pain Score 5     Pain Loc      Pain Edu?      Excl. in GC?    No data found.  Updated Vital Signs BP (!) 154/82 (BP Location: Right Arm)    Pulse 90    Temp 98.6 F (37 C) (Oral)    Resp 17    SpO2 95%   Visual Acuity Right Eye Distance:   Left Eye Distance:   Bilateral Distance:    Right Eye Near:   Left Eye Near:    Bilateral Near:     Physical Exam Vitals and nursing note reviewed.  Constitutional:      General: She is not in acute distress.    Appearance: She is well-developed. She is not ill-appearing.  HENT:     Head: Normocephalic and atraumatic.     Comments: Significant tenderness of the frontal and maxillary sinuses    Right Ear: Tympanic membrane normal.     Nose: Congestion and rhinorrhea present.     Comments: Swollen and erythematous turbinates bilaterally    Mouth/Throat:     Mouth: Mucous membranes are moist.     Comments: Postnasal drip visible. Eyes:     Conjunctiva/sclera: Conjunctivae normal.  Cardiovascular:     Rate and Rhythm: Normal rate and regular rhythm.     Heart sounds: No murmur heard.   Pulmonary:     Effort: Pulmonary effort is normal. No respiratory distress.     Breath sounds: Normal breath sounds.   Abdominal:     Palpations: Abdomen is soft.     Tenderness: There is no abdominal tenderness.  Musculoskeletal:     Cervical back: Neck supple.  Skin:  General: Skin is warm and dry.  Neurological:     Mental Status: She is alert.      UC Treatments / Results  Labs (all labs ordered are listed, but only abnormal results are displayed) Labs Reviewed - No data to display  EKG   Radiology No results found.  Procedures Procedures (including critical care time)  Medications Ordered in UC Medications - No data to display  Initial Impression / Assessment and Plan / UC Course  I have reviewed the triage vital signs and the nursing notes.  Pertinent labs & imaging results that were available during my care of the patient were reviewed by me and considered in my medical decision making (see chart for details).     #Acute recurrent frontal sinusitis Patient is a 59 year old presenting with recurrent sinusitis. We will treat her with cefdinir and have her start Mucinex. We will have her follow-up with her primary care for further discussion of the ENT referral. Patient verbalized understanding agreed with plan of care. Final Clinical Impressions(s) / UC Diagnoses   Final diagnoses:  Acute recurrent frontal sinusitis     Discharge Instructions     Take the cefdinir as prescribed Take the Mucinex as prescribed, drink plenty of water.    Follow-up with your primary care and ENT.    ED Prescriptions    Medication Sig Dispense Auth. Provider   cefdinir (OMNICEF) 300 MG capsule Take 1 capsule (300 mg total) by mouth 2 (two) times daily for 10 days. 20 capsule Maziah Smola, Veryl Speak, PA-C   guaiFENesin (MUCINEX) 600 MG 12 hr tablet Take 2 tablets (1,200 mg total) by mouth 2 (two) times daily for 10 days. 40 tablet Gwendelyn Lanting, Veryl Speak, PA-C     PDMP not reviewed this encounter.   Hermelinda Medicus, PA-C 03/27/20 2133

## 2020-03-27 NOTE — Discharge Instructions (Addendum)
Take the cefdinir as prescribed Take the Mucinex as prescribed, drink plenty of water.    Follow-up with your primary care and ENT.

## 2020-03-27 NOTE — ED Triage Notes (Signed)
Pt presents with ongoing sinus infection that is unrelieved with prescribed medication; pt also complains of bilateral ear pain and headache

## 2020-03-28 ENCOUNTER — Ambulatory Visit (HOSPITAL_COMMUNITY)
Admission: RE | Admit: 2020-03-28 | Discharge: 2020-03-28 | Disposition: A | Discharge status: Home / Self Care | Payer: Medicaid Other | Source: Ambulatory Visit | Attending: Vascular Surgery | Admitting: Vascular Surgery

## 2020-03-28 ENCOUNTER — Encounter: Payer: Self-pay | Admitting: Gastroenterology

## 2020-03-28 ENCOUNTER — Encounter: Payer: Self-pay | Admitting: Vascular Surgery

## 2020-03-28 ENCOUNTER — Ambulatory Visit (INDEPENDENT_AMBULATORY_CARE_PROVIDER_SITE_OTHER): Payer: Medicaid Other | Admitting: Vascular Surgery

## 2020-03-28 VITALS — BP 153/83 | HR 81 | Temp 98.2°F | Resp 20 | Ht 62.0 in | Wt 173.0 lb

## 2020-03-28 DIAGNOSIS — M5136 Other intervertebral disc degeneration, lumbar region: Secondary | ICD-10-CM | POA: Diagnosis not present

## 2020-03-28 DIAGNOSIS — M7989 Other specified soft tissue disorders: Secondary | ICD-10-CM | POA: Diagnosis present

## 2020-03-28 NOTE — Progress Notes (Signed)
REASON FOR CONSULT:    Lower extremity edema.  The consult is requested by Gwinda Passe, NP  ASSESSMENT & PLAN:   LEG PAIN: This patient has bilateral leg pain which occurs sometimes with sitting and standing.  She also has some back pain.  I think most likely she has some degenerative disc disease of her back and this is neurogenic pain.  I reassured her that she has no evidence of significant arterial insufficiency.  Her symptoms are not consistent with symptoms related to arterial insufficiency.  She has palpable pedal pulses bilaterally.  Likewise I explained that I do not think she has significant venous disease given that she has a normal venous duplex study with no evidence of DVT and no evidence of significant venous insufficiency in the deep or superficial system.  If her symptoms worsen I think she would benefit from evaluation for possible degenerative disc disease of her spine.  I will be happy to see her back at any time if any new vascular issues arise.   [name], MD Office: [phone]   HPI:   [name] is a pleasant 59 y.o. female, who is referred with bilateral lower extremity pain and swelling.  On my history, the patient states that she has been having pain in both legs which she describes as a shooting pain.  The location is difficult to describe.  She also describes some low back pain.  I do not get any history of claudication, rest pain, or nonhealing ulcers.  She has had some swelling in the past but states that recently she has not had any significant swelling.  She is had no previous venous procedures.  She is had no previous history of DVT.  She does not wear compression stockings.  She does occasionally elevate her legs which help her symptoms some.  Her risk factors for peripheral vascular disease include hypertension and tobacco use.  She smokes a pack per day of cigarettes.  She denies any history of diabetes, hypercholesterolemia, or family  history of premature cardiovascular disease.  Past Medical History:  Diagnosis Date  . COPD (chronic obstructive pulmonary disease) (HCC)   . Hypertension     Family History  Problem Relation Age of Onset  . Ulcers Father     SOCIAL HISTORY: Social History   Socioeconomic History  . Marital status: Single    Spouse name: Not on file  . Number of children: 0  . Years of education: 80  . Highest education level: GED or equivalent  Occupational History  . Occupation: Armed forces operational officer  Tobacco Use  . Smoking status: Former Smoker    Packs/day: 1.00    Years: 25.00    Pack years: 25.00    Types: Cigarettes    Start date: 08/04/1978    Quit date: 06/05/2019    Years since quitting: 0.8  . Smokeless tobacco: Never Used  Vaping Use  . Vaping Use: Never used  Substance and Sexual Activity  . Alcohol use: Not Currently  . Drug use: Never  . Sexual activity: Never  Other Topics Concern  . Not on file  Social History Narrative   Lives and step-dad, they are able to help.   Social Determinants of Health   Financial Resource Strain:   . Difficulty of Paying Living Expenses: Not on file  Food Insecurity:   . Worried About Programme researcher, broadcasting/film/video in the Last Year: Not on file  . Ran Out of Food in the Last Year:  Not on file  Transportation Needs:   . Lack of Transportation (Medical): Not on file  . Lack of Transportation (Non-Medical): Not on file  Physical Activity:   . Days of Exercise per Week: Not on file  . Minutes of Exercise per Session: Not on file  Stress:   . Feeling of Stress : Not on file  Social Connections:   . Frequency of Communication with Friends and Family: Not on file  . Frequency of Social Gatherings with Friends and Family: Not on file  . Attends Religious Services: Not on file  . Active Member of Clubs or Organizations: Not on file  . Attends Banker Meetings: Not on file  . Marital Status: Not on file  Intimate Partner Violence:   .  Fear of Current or Ex-Partner: Not on file  . Emotionally Abused: Not on file  . Physically Abused: Not on file  . Sexually Abused: Not on file    No Known Allergies  Current Outpatient Medications  Medication Sig Dispense Refill  . Albuterol Sulfate 2.5 MG/0.5ML NEBU Inhale 2.5 mg into the lungs every 6 (six) hours as needed (sob and wheezing).    Marland Kitchen amLODipine (NORVASC) 10 MG tablet Take 1 tablet (10 mg total) by mouth daily. 90 tablet 3  . budesonide-formoterol (SYMBICORT) 160-4.5 MCG/ACT inhaler Inhale 2 puffs into the lungs 2 (two) times daily. 1 Inhaler 11  . cefdinir (OMNICEF) 300 MG capsule Take 1 capsule (300 mg total) by mouth 2 (two) times daily for 10 days. 20 capsule 0  . folic acid (FOLVITE) 1 MG tablet Take 1 tablet (1 mg total) by mouth daily. 90 tablet 1  . furosemide (LASIX) 20 MG tablet TAKE 1 TABLET(20 MG) BY MOUTH DAILY 90 tablet 1  . guaiFENesin (MUCINEX) 600 MG 12 hr tablet Take 2 tablets (1,200 mg total) by mouth 2 (two) times daily for 10 days. 40 tablet 0  . hydrOXYzine (ATARAX/VISTARIL) 50 MG tablet Take 1 tablet (50 mg total) by mouth 2 (two) times daily as needed for anxiety. 60 tablet 1  . ibuprofen (ADVIL) 600 MG tablet Take 1 tablet (600 mg total) by mouth 2 (two) times daily as needed. 60 tablet 1  . loratadine (CLARITIN) 10 MG tablet Take 1 tablet (10 mg total) by mouth daily. 30 tablet 11  . methocarbamol (ROBAXIN) 500 MG tablet TAKE 1 TABLET BY MOUTH THREE TIMES DAILY 60 tablet 0  . oxybutynin (DITROPAN-XL) 10 MG 24 hr tablet Take 1 tablet (10 mg total) by mouth at bedtime. 30 tablet 2  . pantoprazole (PROTONIX) 40 MG tablet Take 1 tablet (40 mg total) by mouth daily. 30 tablet 3  . QUEtiapine (SEROQUEL) 100 MG tablet Take 1 tablet (100 mg total) by mouth at bedtime. 30 tablet 1  . sertraline (ZOLOFT) 100 MG tablet Take 1 tablet (100 mg total) by mouth daily with breakfast. 30 tablet 1   No current facility-administered medications for this visit.     REVIEW OF SYSTEMS:  [X]  denotes positive finding, [ ]  denotes negative finding Cardiac  Comments:  Chest pain or chest pressure:    Shortness of breath upon exertion: x   Short of breath when lying flat:    Irregular heart rhythm:        Vascular    Pain in calf, thigh, or hip brought on by ambulation: x   Pain in feet at night that wakes you up from your sleep:     Blood clot in  your veins:    Leg swelling:  x       Pulmonary    Oxygen at home:    Productive cough:     Wheezing:  x       Neurologic    Sudden weakness in arms or legs:     Sudden numbness in arms or legs:  x   Sudden onset of difficulty speaking or slurred speech:    Temporary loss of vision in one eye:     Problems with dizziness:         Gastrointestinal    Blood in stool:     Vomited blood:         Genitourinary    Burning when urinating:     Blood in urine:        Psychiatric    Major depression:         Hematologic    Bleeding problems:    Problems with blood clotting too easily:        Skin    Rashes or ulcers:        Constitutional    Fever or chills:     PHYSICAL EXAM:   Vitals:   03/28/20 1430  BP: (!) 153/83  Pulse: 81  Resp: 20  Temp: 98.2 F (36.8 C)  SpO2: 95%  Weight: 78.5 kg  Height: 5\' 2"  (1.575 m)   Body mass index is 31.64 kg/m.  GENERAL: The patient is a well-nourished female, in no acute distress. The vital signs are documented above. CARDIAC: There is a regular rate and rhythm.  VASCULAR: I do not detect carotid bruits. She has palpable femoral, popliteal, dorsalis pedis, posterior tibial pulses bilaterally. She currently has no significant lower extremity swelling. She has no hyperpigmentation or significant varicose veins. PULMONARY: There is good air exchange bilaterally without wheezing or rales. ABDOMEN: Soft and non-tender with normal pitched bowel sounds.  MUSCULOSKELETAL: There are no major deformities or cyanosis. NEUROLOGIC: No focal weakness  or paresthesias are detected. SKIN: There are no ulcers or rashes noted. PSYCHIATRIC: The patient has a normal affect.  DATA:    VENOUS DUPLEX: I have independently interpreted her venous duplex scan today.  On the right side, there is no evidence of DVT or superficial venous thrombosis.  There is no deep venous reflux.  There is no superficial venous reflux.  On the left side, there is no evidence of DVT or superficial venous thrombosis.  There is no deep venous reflux.  There is no superficial venous reflux.

## 2020-04-05 MED FILL — QUETIAPINE FUMARATE 100 MG: 100 | 30 days supply | Qty: 30 | Fill #1

## 2020-04-05 MED FILL — PANTOPRAZOLE SOD DR 40 MG T: 40 | 30 days supply | Qty: 30 | Fill #1

## 2020-04-05 MED FILL — FUROSEMIDE 20 MG TABS: 20 | 30 days supply | Qty: 30 | Fill #1

## 2020-04-05 MED FILL — OXYBUTYNIN CL ER 10 MG TAB: 10 | 30 days supply | Qty: 30 | Fill #0

## 2020-04-05 MED FILL — CROMOLYN 4% EYE DROPS: 4 | 37 days supply | Qty: 10 | Fill #0

## 2020-04-05 MED FILL — RESTASIS 0.05% EYE EMULSION: 0.05 | 30 days supply | Qty: 60 | Fill #0

## 2020-04-05 MED FILL — SYMBICORT 160-4.5 MCG INH: 160-4.5 | 30 days supply | Qty: 10 | Fill #4

## 2020-04-10 ENCOUNTER — Ambulatory Visit (HOSPITAL_COMMUNITY): Payer: Self-pay | Admitting: Clinical

## 2020-04-10 DIAGNOSIS — J329 Chronic sinusitis, unspecified: Secondary | ICD-10-CM | POA: Insufficient documentation

## 2020-04-10 MED FILL — IBUPROFEN 600 MG TABLET: 600 | 30 days supply | Qty: 60 | Fill #1

## 2020-04-23 ENCOUNTER — Ambulatory Visit
Admission: EM | Admit: 2020-04-23 | Discharge: 2020-04-23 | Disposition: A | Discharge status: Home / Self Care | Payer: Medicaid Other | Attending: Family Medicine | Admitting: Family Medicine

## 2020-04-23 DIAGNOSIS — Z1152 Encounter for screening for COVID-19: Secondary | ICD-10-CM

## 2020-04-23 NOTE — ED Triage Notes (Signed)
Pt here for covid testing after possible exposure; denies sx

## 2020-04-26 LAB — NOVEL CORONAVIRUS, NAA

## 2020-04-27 ENCOUNTER — Ambulatory Visit
Admission: EM | Admit: 2020-04-27 | Discharge: 2020-04-27 | Disposition: A | Discharge status: Home / Self Care | Payer: Medicaid Other | Attending: Emergency Medicine | Admitting: Emergency Medicine

## 2020-04-27 DIAGNOSIS — Z1152 Encounter for screening for COVID-19: Secondary | ICD-10-CM

## 2020-04-27 NOTE — ED Triage Notes (Signed)
Pt here for reswab 

## 2020-04-28 LAB — SARS-COV-2, NAA 2 DAY TAT

## 2020-04-28 LAB — NOVEL CORONAVIRUS, NAA: SARS-CoV-2, NAA: NOT DETECTED

## 2020-04-30 ENCOUNTER — Encounter (HOSPITAL_COMMUNITY): Payer: Self-pay | Admitting: Psychiatry

## 2020-04-30 ENCOUNTER — Other Ambulatory Visit: Payer: Self-pay

## 2020-04-30 ENCOUNTER — Other Ambulatory Visit (HOSPITAL_COMMUNITY): Payer: Self-pay | Admitting: Psychiatry

## 2020-04-30 ENCOUNTER — Ambulatory Visit (INDEPENDENT_AMBULATORY_CARE_PROVIDER_SITE_OTHER): Payer: Medicaid Other | Admitting: Psychiatry

## 2020-04-30 VITALS — BP 150/70 | HR 88 | Ht 62.0 in | Wt 183.0 lb

## 2020-04-30 DIAGNOSIS — F419 Anxiety disorder, unspecified: Secondary | ICD-10-CM

## 2020-04-30 DIAGNOSIS — F3341 Major depressive disorder, recurrent, in partial remission: Secondary | ICD-10-CM | POA: Diagnosis not present

## 2020-04-30 DIAGNOSIS — F1021 Alcohol dependence, in remission: Secondary | ICD-10-CM | POA: Diagnosis not present

## 2020-04-30 MED ORDER — HYDROXYZINE HCL 50 MG PO TABS: 50.0000 mg | ORAL_TABLET | Freq: Three times a day (TID) | ORAL | 1 refills | Status: DC | PRN

## 2020-04-30 MED ORDER — QUETIAPINE FUMARATE 200 MG PO TABS: 200.0000 mg | ORAL_TABLET | Freq: Every day | ORAL | 1 refills | Status: DC

## 2020-04-30 MED ORDER — SERTRALINE HCL 100 MG PO TABS: 100.0000 mg | ORAL_TABLET | Freq: Every day | ORAL | 1 refills | Status: DC

## 2020-04-30 MED FILL — QUETIAPINE FUMARATE 200 MG: 200 | 90 days supply | Qty: 90 | Fill #0

## 2020-04-30 MED FILL — hydrOXYzine HCL 50 MG TABS: 50 | 90 days supply | Qty: 270 | Fill #0

## 2020-04-30 MED FILL — SERTRALINE HCL 100 MG TAB: 100 | 90 days supply | Qty: 90 | Fill #0

## 2020-04-30 NOTE — Progress Notes (Signed)
BH MD/PA/NP OP Progress Note  04/30/2020 12:48 PM [name]  MRN:  [id number]  Chief Complaint: " I am doing fine." Chief Complaint    Office Visit     HPI: Patient reported that she is doing well overall.  She stated that her mood has been stable.  She stated that she still feels anxious and asked if she could get higher dose of hydroxyzine.  She informed that she is planning to go down to [place] to spend the winter months there.  She informed that she is currently living with her parents and she is tired of their constant becoming.  She stated that she finds reasons to get out of the house during the daytime as her mother continues to argue with her father which she does not like.  She stated that she is planning to go down to [place] to live with her cousin end of October and then will probably come back in April.  She stated that spending winters in [place] will be helpful for her mentally and physically given her mental and physical conditions. She reported that Seroquel does not seem to be too effective in helping her with sleep and asked if she could try higher dose so that she can sleep better during the night.  She reported waking up frequently with the current dose.  Visit Diagnosis:    ICD-10-CM   1. MDD (major depressive disorder), recurrent, in partial remission (HCC)  F33.41   2. Alcohol use disorder, severe, in sustained remission (HCC)  F10.21   3. Anxiety  F41.9     Past Psychiatric History: MDD, severe alcohol use disorder now in remission  Past Medical History:  Past Medical History:  Diagnosis Date  . COPD (chronic obstructive pulmonary disease) (HCC)   . Hypertension     Past Surgical History:  Procedure Laterality Date  . ABDOMINAL HYSTERECTOMY      Family Psychiatric History: denied  Family History:  Family History  Problem Relation Age of Onset  . Ulcers Father     Social History:  Social History   Socioeconomic History  . Marital status: Single     Spouse name: Not on file  . Number of children: 0  . Years of education: 64  . Highest education level: GED or equivalent  Occupational History  . Occupation: Armed forces operational officer  Tobacco Use  . Smoking status: Former Smoker    Packs/day: 1.00    Years: 25.00    Pack years: 25.00    Types: Cigarettes    Start date: 08/04/1978    Quit date: 06/05/2019    Years since quitting: 0.9  . Smokeless tobacco: Never Used  Vaping Use  . Vaping Use: Never used  Substance and Sexual Activity  . Alcohol use: Not Currently  . Drug use: Never  . Sexual activity: Never  Other Topics Concern  . Not on file  Social History Narrative   Lives and step-dad, they are able to help.   Social Determinants of Health   Financial Resource Strain:   . Difficulty of Paying Living Expenses: Not on file  Food Insecurity:   . Worried About Programme researcher, broadcasting/film/video in the Last Year: Not on file  . Ran Out of Food in the Last Year: Not on file  Transportation Needs:   . Lack of Transportation (Medical): Not on file  . Lack of Transportation (Non-Medical): Not on file  Physical Activity:   . Days of Exercise per Week: Not on  file  . Minutes of Exercise per Session: Not on file  Stress:   . Feeling of Stress : Not on file  Social Connections:   . Frequency of Communication with Friends and Family: Not on file  . Frequency of Social Gatherings with Friends and Family: Not on file  . Attends Religious Services: Not on file  . Active Member of Clubs or Organizations: Not on file  . Attends Banker Meetings: Not on file  . Marital Status: Not on file    Allergies: No Known Allergies  Metabolic Disorder Labs: Lab Results  Component Value Date   HGBA1C 5.1 11/04/2019   MPG 99.67 11/04/2019   No results found for: PROLACTIN No results found for: CHOL, TRIG, HDL, CHOLHDL, VLDL, LDLCALC No results found for: TSH  Therapeutic Level Labs: No results found for: LITHIUM No results found for:  VALPROATE No components found for:  CBMZ  Current Medications: Current Outpatient Medications  Medication Sig Dispense Refill  . Albuterol Sulfate 2.5 MG/0.5ML NEBU Inhale 2.5 mg into the lungs every 6 (six) hours as needed (sob and wheezing).    Marland Kitchen amLODipine (NORVASC) 10 MG tablet Take 1 tablet (10 mg total) by mouth daily. 90 tablet 3  . budesonide-formoterol (SYMBICORT) 160-4.5 MCG/ACT inhaler Inhale 2 puffs into the lungs 2 (two) times daily. 1 Inhaler 11  . folic acid (FOLVITE) 1 MG tablet Take 1 tablet (1 mg total) by mouth daily. 90 tablet 1  . furosemide (LASIX) 20 MG tablet TAKE 1 TABLET(20 MG) BY MOUTH DAILY 90 tablet 1  . hydrOXYzine (ATARAX/VISTARIL) 50 MG tablet Take 1 tablet (50 mg total) by mouth 2 (two) times daily as needed for anxiety. 60 tablet 1  . ibuprofen (ADVIL) 600 MG tablet Take 1 tablet (600 mg total) by mouth 2 (two) times daily as needed. 60 tablet 1  . loratadine (CLARITIN) 10 MG tablet Take 1 tablet (10 mg total) by mouth daily. 30 tablet 11  . methocarbamol (ROBAXIN) 500 MG tablet TAKE 1 TABLET BY MOUTH THREE TIMES DAILY 60 tablet 0  . oxybutynin (DITROPAN-XL) 10 MG 24 hr tablet Take 1 tablet (10 mg total) by mouth at bedtime. 30 tablet 2  . pantoprazole (PROTONIX) 40 MG tablet Take 1 tablet (40 mg total) by mouth daily. 30 tablet 3  . QUEtiapine (SEROQUEL) 100 MG tablet Take 1 tablet (100 mg total) by mouth at bedtime. 30 tablet 1  . sertraline (ZOLOFT) 100 MG tablet Take 1 tablet (100 mg total) by mouth daily with breakfast. 30 tablet 1   No current facility-administered medications for this visit.     Musculoskeletal: Strength & Muscle Tone: within normal limits Gait & Station: normal Patient leans: N/A  Psychiatric Specialty Exam: Review of Systems  There were no vitals taken for this visit.There is no height or weight on file to calculate BMI.  General Appearance: Fairly Groomed  Eye Contact:  Good  Speech:  Clear and Coherent and Normal Rate   Volume:  Normal  Mood:  Euthymic  Affect:  Congruent  Thought Process:  Goal Directed and Descriptions of Associations: Intact  Orientation:  Full (Time, Place, and Person)  Thought Content: Logical   Suicidal Thoughts:  No  Homicidal Thoughts:  No  Memory:  Immediate;   Good Recent;   Good  Judgement:  Fair  Insight:  Fair  Psychomotor Activity:  Normal  Concentration:  Concentration: Good and Attention Span: Good  Recall:  Good  Fund of Knowledge: Good  Language: Good  Akathisia:  Negative  Handed:  Right  AIMS (if indicated): 0  Assets:  Communication Skills Desire for Improvement Financial Resources/Insurance Housing  ADL's:  Intact  Cognition: WNL  Sleep:  Fair   Screenings: GAD-7     Counselor from 02/10/2020 in [facility] Office Visit from 12/22/2019 in [facility] Office Visit from 12/05/2019 in [facility] Telemedicine from 11/16/2019 in [facility]  Total GAD-7 Score 18 21 19  0    PHQ2-9     Counselor from 02/10/2020 in [facility] Office Visit from 12/22/2019 in [facility] Office Visit from 12/05/2019 in [facility] Telemedicine from 11/16/2019 in [facility]  PHQ-2 Total Score 4 5 6  0  PHQ-9 Total Score 10 23 23  --       Assessment and Plan: Patient appears to be doing well however is requesting higher dose of Seroquel for optimal sleep and higher dose of hydroxyzine to help with anxiety.  She is planning to go down to [facility] for the next 5 to 6 months.  VS: Blood pressure (!) 150/70, pulse 88, height 5\' 2"  (1.575 m), weight 183 lb (83 kg), SpO2 98 %.   1. MDD (major depressive disorder), recurrent, in partial remission (HCC)  - sertraline (ZOLOFT) 100 MG tablet; Take 1 tablet (100 mg total) by mouth daily with breakfast.  Dispense: 90 tablet; Refill: 1 - Increase  QUEtiapine (SEROQUEL) 200 MG tablet; Take 1 tablet (200 mg total) by mouth at bedtime.  Dispense: 90 tablet; Refill: 1  2. Alcohol use disorder, severe, in sustained remission (HCC)   3. Anxiety - Increase hydrOXYzine (ATARAX/VISTARIL) 50 MG tablet; Take 1 tablet (50 mg total) by mouth 3 (three) times daily as needed for anxiety.  Dispense: 270 tablet; Refill: 1  F/up in 3-4 months.   , MD 04/30/2020, 12:48 PM

## 2020-05-01 ENCOUNTER — Ambulatory Visit (INDEPENDENT_AMBULATORY_CARE_PROVIDER_SITE_OTHER): Payer: Medicaid Other | Admitting: Clinical

## 2020-05-01 DIAGNOSIS — F3341 Major depressive disorder, recurrent, in partial remission: Secondary | ICD-10-CM | POA: Diagnosis not present

## 2020-05-01 MED FILL — FOLIC ACID 1 MG TABS: 1 | 30 days supply | Qty: 30 | Fill #2

## 2020-05-01 MED FILL — OXYBUTYNIN CL ER 10 MG TAB: 10 | 30 days supply | Qty: 30 | Fill #1

## 2020-05-03 NOTE — Progress Notes (Signed)
   THERAPIST PROGRESS NOTE  Session Time: 30 minutes  Participation Level: Active  Behavioral Response: CasualAlertDepressed  Type of Therapy: Individual Therapy  Treatment Goals addressed: Diagnosis: Depression  Interventions: CBT  Summary:  [name] is a 59 y.o. female who presents in person for the scheduled appointment oriented times five, appropriately dressed, and friendly. Client denied hallucinations and delusions. Client reported on today feeling anxious and some sadness. Client reported although having a good relationship with her parents she still has anxiety and depression triggered by living at home. Client reported she is still going to doctor appointments due to the swelling in her feet, legs and arms. Client reported since the last session she has been awarded disability which has helped her with financial stability. Client reported at the end of October she will be leaving to go stay with her boyfriend in [place] for a couple of months to get away. Client reported while thinking about the transition to [place] she still has intrusive thoughts about her past marriages and reflection on why they treated her the way they did. Client reported the relationship with her boyfriend has been "great" and he has been supportive and someone she can talk to everyday even from a distance. Client reported in regards to her familial support, she loves her sister but she is keeping a distant relationship form her because of the things that happened between them when the clients husband passed away. Client reported she suspects her mood will improve once she leaves for [place].     Suicidal/Homicidal: Nowithout intent/plan  Therapist Response:  Therapist began the session by checking in and asking the client how she has been doing since last seen. Therapist actively listened and used empathy while allowing time to focus on the clients thoughts and feelings. Therapist utilized CBT by  collaborating with the client to discuss grounding techniques such as identified effective techniques of deep breathing or going outside and guided self talk that helps with cycling negative thoughts. Therapist instructed the client to continue practicing self care and coping skills.     Plan: Return again in 3 weeks for individual therapy.  Diagnosis: Major depressive disorder, recurrent , in partial remission    Neena Rhymes Armenta Erskin, LCSW 05/03/2020

## 2020-05-08 MED FILL — SYMBICORT 160-4.5 MCG INH: 160-4.5 | 30 days supply | Qty: 10 | Fill #5

## 2020-05-08 MED FILL — PANTOPRAZOLE SOD DR 40 MG T: 40 | 30 days supply | Qty: 30 | Fill #2

## 2020-05-08 MED FILL — FUROSEMIDE 20 MG TABS: 20 | 30 days supply | Qty: 30 | Fill #2

## 2020-05-15 ENCOUNTER — Ambulatory Visit (INDEPENDENT_AMBULATORY_CARE_PROVIDER_SITE_OTHER): Payer: Medicaid Other | Admitting: Clinical

## 2020-05-15 ENCOUNTER — Other Ambulatory Visit: Payer: Self-pay

## 2020-05-15 DIAGNOSIS — F3341 Major depressive disorder, recurrent, in partial remission: Secondary | ICD-10-CM | POA: Diagnosis not present

## 2020-05-16 NOTE — Progress Notes (Signed)
° °  THERAPIST PROGRESS NOTE  Session Time: 30 minutes  Participation Level: Active  Behavioral Response: CasualAlertDepressed  Type of Therapy: Individual Therapy  Treatment Goals addressed: Diagnosis: depression  Interventions: CBT  Summary:  [name] is a 59 y.o. female who presents for the scheduled session oriented times five, appropriately dressed, and friendly. Client denied hallucinations and delusions.  Client reported on today feeling down and anxious. Client reported since the last session her mother has become more irritable about little things and is less tolerable to be around. Client reported her dad has also noticed the worsening of her mood. Client reported her father has also noticed the change of her mothers mood but doe snot say anything to her about it. Client reported she is looking forward to leaving at the end of the month to visit and stay with her boyfriend until May next year. Client reported reflected on times when going places here with her mother that she wasn't able to enjoy such as the zoo or wanting to go do things because of her mothers attitude and/or inability to patience to enjoy the outing. Client discussed with the therapist how she believes her mood will change for the better while away. Client reported she believes she will feel better and calm. Client also discussed with the therapist point to engage in activities she would not normally do at home such as visiting tourist attractions and trying new food places.  Client reported she is currently doing well overall with no concerns at this time.      Suicidal/Homicidal: Nowithout intent/plan  Therapist Response: Therapist began the session by checking in and asking how she has been doing since last seen. Therapist actively listened to the clients thoughts and feelings. Therapist used CBT to engage the client in conversation about the origin her her depressed and anxious thoughts and  behaviors. Therapist discussed self -care with the client by engaging in activities that would create happiness. Therapist discussed closing the clients chart and meeting the goals on her treatment plan, with reevaluation once she returns for services in a few months.     Plan: Return again when she is ready to engage in therapy session when she returns to [place], [place] in a few months.  Diagnosis: Major depressive disorder recurrent in partial remission    Neena Rhymes Manaia Samad, LCSW 05/16/2020

## 2020-05-22 ENCOUNTER — Other Ambulatory Visit: Payer: Self-pay | Admitting: Internal Medicine

## 2020-05-22 ENCOUNTER — Other Ambulatory Visit: Payer: Self-pay

## 2020-05-22 ENCOUNTER — Ambulatory Visit: Payer: Medicaid Other | Admitting: Internal Medicine

## 2020-05-22 ENCOUNTER — Encounter: Payer: Self-pay | Admitting: Internal Medicine

## 2020-05-22 VITALS — BP 166/88 | HR 84 | Temp 97.3°F | Resp 17 | Wt 185.0 lb

## 2020-05-22 DIAGNOSIS — R52 Pain, unspecified: Secondary | ICD-10-CM

## 2020-05-22 MED ORDER — CYCLOBENZAPRINE HCL 10 MG PO TABS: 10.0000 mg | ORAL_TABLET | Freq: Three times a day (TID) | ORAL | 1 refills | Status: DC | PRN

## 2020-05-22 MED ORDER — IBUPROFEN 600 MG PO TABS: 600.0000 mg | ORAL_TABLET | Freq: Two times a day (BID) | ORAL | 1 refills | Status: DC | PRN

## 2020-05-22 NOTE — Progress Notes (Signed)
Subjective:    [name] - 59 y.o. female MRN [id number]  Date of birth: August 16, 1960  HPI  [name] is here for acute concern of leg and arm pain. Reports that she has diffuse pain all over her body. Located across the top of her back, hands, legs/feet. Pain shoots through different locations of her body and sometimes feels hot. Low back hurts, but not as bad as other areas. Has never seen a doctor for these complaints. Although did see [facility] but was not found to have any arterial insufficiency or evidence of DVT. Feels like she can never get her muscles to relax. Has tried to use one of the massage guns. Ran out of the Ibuprofen 600 mg. Reports that did help.      Health Maintenance:  Health Maintenance Due  Topic Date Due  . COVID-19 Vaccine (1) Never done  . TETANUS/TDAP  Never done  . PAP SMEAR-Modifier  Never done  . MAMMOGRAM  Never done  . COLONOSCOPY  Never done  . INFLUENZA VACCINE  Never done    -  reports that she quit smoking about a year ago. Her smoking use included cigarettes. She started smoking about 41 years ago. She has a 25.00 pack-year smoking history. She has never used smokeless tobacco. - Review of Systems: Per HPI. - Past Medical History: Patient Active Problem List   Diagnosis Date Noted  . MDD (major depressive disorder), recurrent, in partial remission (HCC) 04/30/2020  . Alcohol use disorder, severe, in sustained remission (HCC) 04/30/2020  . Anxiety 04/30/2020  . Liver cirrhosis (HCC) 03/08/2020  . Major depressive disorder, recurrent episode, moderate with anxious distress (HCC) 02/11/2020   - Medications: reviewed and updated   Objective:   Physical Exam BP (!) 166/88   Pulse 84   Temp (!) 97.3 F (36.3 C) (Temporal)   Resp 17   Wt 185 lb (83.9 kg)   SpO2 94%   BMI 33.84 kg/m  Physical Exam Constitutional:      General: She is not in acute distress.    Appearance: She is not diaphoretic.  Cardiovascular:     Rate and Rhythm:  Normal rate.  Pulmonary:     Effort: Pulmonary effort is normal. No respiratory distress.  Musculoskeletal:        General: Normal range of motion.     Comments: TTP diffusely over upper back muscles, across legs, and arms.   Skin:    General: Skin is warm and dry.  Neurological:     Mental Status: She is alert and oriented to person, place, and time.  Psychiatric:        Mood and Affect: Affect normal.        Judgment: Judgment normal.            Assessment & Plan:  1. Diffuse pain Unclear etiology. Will obtain labs to start work up. Given complaint of joint swelling, will evaluate for RA or other autoimmune diseases with ANA. Exam is benign other than multiple tender points. Would consider fibromyalgia as potential cause if work up is unrevealing. Could consider switching from Zoloft to medication with indication for fibromyalgia such as Amitriptyline or Cymbalta.  - Sedimentation Rate - CK - C-reactive protein - Comprehensive metabolic panel - TSH - Rheumatoid factor - Vitamin D, 25-hydroxy - ibuprofen (ADVIL) 600 MG tablet; Take 1 tablet (600 mg total) by mouth 2 (two) times daily as needed.  Dispense: 60 tablet; Refill: 1 - cyclobenzaprine (FLEXERIL) 10 MG tablet;  Take 1 tablet (10 mg total) by mouth 3 (three) times daily as needed for muscle spasms.  Dispense: 30 tablet; Refill: 1 - ANA, IFA (with reflex)   Marcy Siren, D.O. 05/22/2020, 4:31 PM Primary Care at [facility]

## 2020-05-23 LAB — COMPREHENSIVE METABOLIC PANEL
ALT: 15 IU/L (ref 0–32)
AST: 16 IU/L (ref 0–40)
Albumin/Globulin Ratio: 2 (ref 1.2–2.2)
Albumin: 4.3 g/dL (ref 3.8–4.9)
Alkaline Phosphatase: 127 IU/L — ABNORMAL HIGH (ref 44–121)
BUN/Creatinine Ratio: 13 (ref 9–23)
BUN: 11 mg/dL (ref 6–24)
Bilirubin Total: 0.2 mg/dL (ref 0.0–1.2)
CO2: 27 mmol/L (ref 20–29)
Calcium: 9.4 mg/dL (ref 8.7–10.2)
Chloride: 101 mmol/L (ref 96–106)
Creatinine, Ser: 0.84 mg/dL (ref 0.57–1.00)
GFR calc Af Amer: 88 mL/min/{1.73_m2} (ref >59)
GFR calc non Af Amer: 76 mL/min/{1.73_m2} (ref >59)
Globulin, Total: 2.2 g/dL (ref 1.5–4.5)
Glucose: 120 mg/dL — ABNORMAL HIGH (ref 65–99)
Potassium: 4.1 mmol/L (ref 3.5–5.2)
Sodium: 141 mmol/L (ref 134–144)
Total Protein: 6.5 g/dL (ref 6.0–8.5)

## 2020-05-23 LAB — C-REACTIVE PROTEIN: CRP: 14 mg/L — ABNORMAL HIGH (ref 0–10)

## 2020-05-23 LAB — VITAMIN D 25 HYDROXY (VIT D DEFICIENCY, FRACTURES): Vit D, 25-Hydroxy: 24.8 ng/mL — ABNORMAL LOW (ref 30.0–100.0)

## 2020-05-23 LAB — ANTINUCLEAR ANTIBODIES, IFA: ANA Titer 1: NEGATIVE

## 2020-05-23 LAB — SEDIMENTATION RATE: Sed Rate: 20 mm/hr (ref 0–40)

## 2020-05-23 LAB — RHEUMATOID FACTOR: Rheumatoid fact SerPl-aCnc: 18.6 IU/mL — ABNORMAL HIGH (ref 0.0–13.9)

## 2020-05-23 LAB — CK: Total CK: 43 U/L (ref 32–182)

## 2020-05-23 LAB — TSH: TSH: 2.37 u[IU]/mL (ref 0.450–4.500)

## 2020-05-23 MED FILL — IBUPROFEN 600 MG TABLET: 600 | 30 days supply | Qty: 60 | Fill #0

## 2020-05-23 MED FILL — CYCLOBENZAPRINE 10 MG TAB: 10 | 10 days supply | Qty: 30 | Fill #0

## 2020-05-24 ENCOUNTER — Other Ambulatory Visit: Payer: Self-pay | Admitting: Internal Medicine

## 2020-05-24 DIAGNOSIS — R7982 Elevated C-reactive protein (CRP): Secondary | ICD-10-CM

## 2020-05-24 DIAGNOSIS — R768 Other specified abnormal immunological findings in serum: Secondary | ICD-10-CM

## 2020-05-24 NOTE — Progress Notes (Signed)
Patient notified of results & recommendations. Expressed understanding.

## 2020-05-29 ENCOUNTER — Ambulatory Visit: Payer: Medicaid Other | Admitting: Gastroenterology

## 2020-06-01 ENCOUNTER — Other Ambulatory Visit (INDEPENDENT_AMBULATORY_CARE_PROVIDER_SITE_OTHER): Payer: Self-pay | Admitting: Internal Medicine

## 2020-06-01 ENCOUNTER — Other Ambulatory Visit (INDEPENDENT_AMBULATORY_CARE_PROVIDER_SITE_OTHER): Payer: Self-pay | Admitting: Primary Care

## 2020-06-01 MED FILL — FOLIC ACID 1 MG TABS: 1 | 30 days supply | Qty: 30 | Fill #0

## 2020-06-01 MED FILL — CYCLOBENZAPRINE 10 MG TAB: 10 | 10 days supply | Qty: 30 | Fill #1

## 2020-06-01 MED FILL — SYMBICORT 160-4.5 MCG INH: 160-4.5 | 30 days supply | Qty: 10 | Fill #6

## 2020-06-01 MED FILL — OXYBUTYNIN CL ER 10 MG TAB: 10 | 30 days supply | Qty: 30 | Fill #2

## 2020-06-01 NOTE — Telephone Encounter (Signed)
Sent to pcp. Maryjean Morn, CMA

## 2020-06-11 MED FILL — PANTOPRAZOLE SOD DR 40 MG T: 40 | 30 days supply | Qty: 30 | Fill #3

## 2020-06-11 MED FILL — FUROSEMIDE 20 MG TABS: 20 | 30 days supply | Qty: 30 | Fill #3

## 2020-06-11 MED FILL — AMLODIPINE BESYLATE 10 MG T: 10 | 90 days supply | Qty: 90 | Fill #1

## 2020-06-20 MED FILL — IBUPROFEN 600 MG TABLET: 600 | 30 days supply | Qty: 60 | Fill #1

## 2020-06-25 NOTE — Progress Notes (Signed)
Office Visit Note  Patient: [name]             Date of Birth: 1960-10-07           MRN: [id number]             PCP: Nicolette Bang, DO Referring: Caryl Never* Visit Date: 06/26/2020   Subjective:  New Patient (Initial Visit) and Joint Pain   History of Present Illness: Talisha Erby is a 59 y.o. female here for evaluation of joint pains with positive RF and elevated CRP. She has been experiencing increased joint pain symptoms for approximately the past one year. She has upper back tightness with pain on several types of movement. She also experiences pain and tingling in her bilateral hands. She has a history of bilateral carpal tunnel syndrome with surgical release years ago with good response. She has chronic swelling in her lower legs that worsens throughout the day with prolonged standing and improves in the morning. She takes ibuprofen 882m sometimes in the evening with partial symptom benefit. She was taking muscle relaxant medication sometimes this helped the upper back and left hip and low back area. She has noticed decreased flexibility in her back and hips now unable to easily sit cross-legged. She does not generally notice a lot of swelling outside of the legs. She denies any other precipitating trauma or major medical changes.  She reports having bone density scan within past 2-3 years that did not show osteoporosis.  Labs reviewed 05/2020 ANA negative Vitamin D 24.8 RF 18.6 TSH 2.37 CMP normal CRP 14 CK 43 ESR 20  11/2019 HCV negative   Activities of Daily Living:  Patient reports morning stiffness for 24 hours.   Patient Reports nocturnal pain.  Difficulty dressing/grooming: Denies Difficulty climbing stairs: Reports Difficulty getting out of chair: Reports Difficulty using hands for taps, buttons, cutlery, and/or writing: Reports  Review of Systems  Constitutional: Positive for fatigue.  HENT: Positive for mouth dryness. Negative  for mouth sores and nose dryness.   Eyes: Negative for pain, itching, visual disturbance and dryness.  Respiratory: Negative for cough, hemoptysis, shortness of breath and difficulty breathing.   Cardiovascular: Positive for swelling in legs/feet. Negative for chest pain and palpitations.  Gastrointestinal: Negative for abdominal pain, blood in stool, constipation and diarrhea.  Endocrine: Negative for increased urination.  Genitourinary: Negative for painful urination.  Musculoskeletal: Positive for arthralgias, joint pain, joint swelling, myalgias, muscle weakness, morning stiffness, muscle tenderness and myalgias.  Skin: Positive for redness. Negative for color change and rash.  Allergic/Immunologic: Negative for susceptible to infections.  Neurological: Positive for weakness. Negative for dizziness, numbness, headaches and memory loss.  Hematological: Negative for swollen glands.  Psychiatric/Behavioral: Positive for sleep disturbance. Negative for confusion.    PMFS History:  Patient Active Problem List   Diagnosis Date Noted  . Rheumatoid factor positive 06/26/2020  . CRP elevated 06/26/2020  . Bilateral hand pain 06/26/2020  . Stasis dermatitis of both legs 06/26/2020  . MDD (major depressive disorder), recurrent, in partial remission ([facility] 04/30/2020  . Alcohol use disorder, severe, in sustained remission ([facility] 04/30/2020  . Anxiety 04/30/2020  . Chronic sinusitis 04/10/2020  . Liver cirrhosis ([facility] 03/08/2020  . Major depressive disorder, recurrent episode, moderate with anxious distress ([facility] [place] 02/11/2020  . Alcohol abuse, in remission 12/16/2019  . Nicotine dependence, cigarettes, uncomplicated 040/98/1191 . Generalized anxiety disorder 12/16/2019    Past Medical History:  Diagnosis Date  . COPD (chronic obstructive pulmonary disease) (  [place])   . Hypertension     Family History  Problem Relation Age of Onset  . Ulcers Father   . Hypotension Sister   . Hypertension  Brother    Past Surgical History:  Procedure Laterality Date  . ABDOMINAL HYSTERECTOMY    . CARPAL TUNNEL RELEASE Bilateral    Per patient  . SHOULDER SURGERY     Per patient   Social History   Social History Narrative   Lives and step-dad, they are able to help.    There is no immunization history on file for this patient.   Objective: Vital Signs: BP 140/72 (BP Location: Left Arm, Patient Position: Sitting, Cuff Size: Small)   Pulse 96   Ht 5' 2.5" (1.588 m)   Wt 195 lb 3.2 oz (88.5 kg)   BMI 35.13 kg/m    Physical Exam Constitutional:      Appearance: She is obese.  HENT:     Head: Normocephalic.     Right Ear: External ear normal.     Mouth/Throat:     Mouth: Mucous membranes are moist.     Pharynx: Oropharynx is clear.     Comments: Upper dentures in place Eyes:     Conjunctiva/sclera: Conjunctivae normal.  Cardiovascular:     Rate and Rhythm: Normal rate and regular rhythm.  Pulmonary:     Effort: Pulmonary effort is normal.     Breath sounds: Normal breath sounds.  Skin:    General: Skin is warm and dry.     Comments: Well healed large lesion on medial right calf 2+ pitting edema with warmth and erythema in bilateral legs from ankle about halfway to knee  Neurological:     General: No focal deficit present.     Mental Status: She is alert.  Psychiatric:        Mood and Affect: Mood normal.     Musculoskeletal Exam:  Neck full range of motion, mild pain with left lateral rotation Shoulder, elbow, wrist, fingers full range of motion, wrists painful with active and passive ROM, no synovitis noticed Upper back and shoulder muscle tone increased with tenderness to pressure worse on left side, low back paraspinal muscle tenderness b/l Hip internal rotation is reduced left more than right, with lateral hip pain at end ROM Knees, ankles, MTPs full range of motion, superficial swelling overlies ankles   Investigation: No additional  findings.  Imaging: XR Hand 2 View Left  Result Date: 06/26/2020 X-ray left hand 2 views Radiocarpal and carpal joint spaces appear normal.  MCP, PIP, DIP joint spaces are normal.  No osteophytes or erosions seen.  Bone mineralization suggest generalized osteopenia.  No soft tissue swelling seen. Impression No significant degenerative or inflammatory arthritis abnormalities are seen, bone density suggest generalized osteopenia  XR Hand 2 View Right  Result Date: 06/26/2020 X-ray right hand 2 views Radiocarpal carpal joint spaces appear normal.  Normal MCP, PIP, and DIP joint spaces.  Bone density appears moderately decreased throughout.  Possible sclerosis or reticular irregularity and third middle phalanx.  No osteophytes or joint erosions seen.  No soft tissue swelling seen. Impression No significant degenerative or inflammatory arthritis abnormalities are seen, generalized osteopenia    Recent Labs: Lab Results  Component Value Date   WBC 11.6 (H) 12/29/2019   HGB 14.3 12/29/2019   PLT 423 (H) 12/29/2019   NA 141 05/22/2020   K 4.1 05/22/2020   CL 101 05/22/2020   CO2 27 05/22/2020   GLUCOSE 120 (  H) 05/22/2020   BUN 11 05/22/2020   CREATININE 0.84 05/22/2020   BILITOT 0.2 05/22/2020   ALKPHOS 127 (H) 05/22/2020   AST 16 05/22/2020   ALT 15 05/22/2020   PROT 6.5 05/22/2020   ALBUMIN 4.3 05/22/2020   CALCIUM 9.4 05/22/2020   GFRAA 88 05/22/2020    Speciality Comments: No specialty comments available.  Procedures:  No procedures performed Allergies: Patient has no known allergies.   Assessment / Plan:     Visit Diagnoses: Rheumatoid factor positive - Plan: XR Hand 2 View Left, XR Hand 2 View Right, Cyclic citrul peptide antibody, IgG - Plan: Cyclic citrul peptide antibody, IgG CRP elevated Bilateral hand pain  Bilateral wrist and hand pain today but no overt synovitis on physical exam or joint effusions seen on limited ultrasound exam. Hand xrays show generalized  osteopenia of the hands but not juxtaarticular osteopenia or any erosive changes. RF is weakly positive, will check CCP Ab titer if positive would be more specific if negative She had some vitamin D deficiency but reports having bone densitometry without osteoporosis. No persistent hypercalcemia noted in chart review but could consider PTH and urine calcium if no other cause identified.  Bilateral stasis dermatitis  Discussed erythema and pain in lower extremities looks secondary to stasis dermatitis not systemic inflammatory process. Discussed leg elevation, considering compression socks, apparently had unremarkable vein study previously though but encouraged her to follow up with PCP about this and continued diuretic treatmen.  Orders: Orders Placed This Encounter  Procedures  . XR Hand 2 View Left  . XR Hand 2 View Right  . Cyclic citrul peptide antibody, IgG   No orders of the defined types were placed in this encounter.   Follow-Up Instructions: No follow-ups on file.   Collier Salina, MD  Note - This record has been created using [employer].  Chart creation errors have been sought, but may not always  have been located. Such creation errors do not reflect on  the standard of medical care.

## 2020-06-26 ENCOUNTER — Ambulatory Visit (INDEPENDENT_AMBULATORY_CARE_PROVIDER_SITE_OTHER): Payer: Medicaid Other | Admitting: Internal Medicine

## 2020-06-26 ENCOUNTER — Ambulatory Visit: Payer: Self-pay

## 2020-06-26 ENCOUNTER — Other Ambulatory Visit: Payer: Self-pay

## 2020-06-26 ENCOUNTER — Encounter: Payer: Self-pay | Admitting: Internal Medicine

## 2020-06-26 VITALS — BP 140/72 | HR 96 | Ht 62.5 in | Wt 195.2 lb

## 2020-06-26 DIAGNOSIS — R768 Other specified abnormal immunological findings in serum: Secondary | ICD-10-CM | POA: Diagnosis not present

## 2020-06-26 DIAGNOSIS — M79641 Pain in right hand: Secondary | ICD-10-CM

## 2020-06-26 DIAGNOSIS — M79642 Pain in left hand: Secondary | ICD-10-CM

## 2020-06-26 DIAGNOSIS — I872 Venous insufficiency (chronic) (peripheral): Secondary | ICD-10-CM | POA: Diagnosis not present

## 2020-06-26 DIAGNOSIS — R7982 Elevated C-reactive protein (CRP): Secondary | ICD-10-CM

## 2020-06-26 NOTE — Patient Instructions (Signed)
We are checking an additional blood test that can indicate rheumatoid arthritis to try to rule out this as a cause of your current joint pain. I do not see evidence of the disease on examination so I think it is less likely.   Anticyclic-Citrullinated Peptide Antibody Test Why am I having this test? You may have the anticyclic-citrullinated peptide antibody test done to help:  Diagnose rheumatoid arthritis (RA). RA is a long-term (chronic) disease that causes inflammation in the joints.  Determine the severity of your RA, including how much worse it is getting (progression). This test may be done if you have unexplained joint inflammation and have previously tested negative for rheumatoid factor. It may also be done if you have been diagnosed with undifferentiated arthritis and your health care provider suspects rheumatoid arthritis. What is being tested? This test checks your blood for the presence of anticyclic-citrullinated peptide antibodies. Antibodies are cells that are part of the body's disease-fighting (immune) system. These antibodies appear early in the course of RA and are thought to be directly involved in the progression of the disease. What kind of sample is taken?  A blood sample is required for this test. It is usually collected by inserting a needle into a blood vessel. How are the results reported? Your test results will be reported as either positive or negative. A result is considered negative if there is less than 20 units of the antibody per mL of blood. What do the results mean?  A positive blood test may mean that you have RA.  A negative blood test means that it is less likely that you have RA. However, a negative test does not completely rule out rheumatoid arthritis. Talk with your health care provider about what your results mean. Questions to ask your health care provider Ask your health care provider, or the department that is doing the test:  When will my  results be ready?  How will I get my results?  What are my treatment options?  What other tests do I need?  What are my next steps? Summary  The anticyclic-citrullinated peptide antibody blood test may be done to help your health care provider diagnose rheumatoid arthritis (RA).  This test checks your blood for the presence of anticyclic-citrullinated peptide antibodies. These antibodies appear early in the course of RA.  A positive blood test may mean that you have RA.    Myofascial Pain Syndrome Myofascial pain syndrome and fibromyalgia are both pain disorders. This pain may be felt mainly in your muscles.  Myofascial pain syndrome: ? Always has tender points in the muscle that will cause pain when pressed (trigger points). The pain may come and go. ? Usually affects your neck, upper back, and shoulder areas. The pain often radiates into your arms and hands. Fibromyalgia and myofascial pain syndrome are not the same. However, they often occur together. If you have both conditions, each can make the other worse. Both are common and can cause enough pain and fatigue to make day-to-day activities difficult. Both can be hard to diagnose because their symptoms are common in many other conditions. What are the causes? The exact causes of these conditions are not known. What increases the risk? You are more likely to develop this condition if:  You have a family history of the condition.  You have certain triggers, such as: ? Spine disorders. ? An injury (trauma) or other physical stressors. ? Being under a lot of stress. ? Medical conditions such as  osteoarthritis, rheumatoid arthritis, or lupus. What are the signs or symptoms? Myofascial pain syndrome Symptoms of myofascial pain syndrome include:  Tight, ropy bands of muscle.  Uncomfortable sensations in muscle areas. These may include aching, cramping, burning, numbness, tingling, and weakness.  Difficulty moving certain  parts of the body freely (poor range of motion). How is this diagnosed? This condition may be diagnosed by your symptoms and medical history. You will also have a physical exam. In general:  Fibromyalgia is diagnosed if you have pain, fatigue, and other symptoms for more than 3 months, and symptoms cannot be explained by another condition.  Myofascial pain syndrome is diagnosed if you have trigger points in your muscles, and those trigger points are tender and cause pain elsewhere in your body (referred pain). How is this treated? Treatment for these conditions depends on the type that you have.  For myofascial pain: ? Pain medicines, such as NSAIDs. ? Cooling and stretching of muscles. ? Trigger point injections. ? Sound wave (ultrasound) treatments to stimulate muscles. Treating these conditions often requires a team of health care providers. These may include:  Your primary care provider.  Physical therapist.  Complementary health care providers, such as massage therapists or acupuncturists.  Psychiatrist for cognitive behavioral therapy. Follow these instructions at home: Medicines  Take over-the-counter and prescription medicines only as told by your health care provider.  Do not drive or use heavy machinery while taking prescription pain medicine.  If you are taking prescription pain medicine, take actions to prevent or treat constipation. Your health care provider may recommend that you: ? Drink enough fluid to keep your urine pale yellow. ? Eat foods that are high in fiber, such as fresh fruits and vegetables, whole grains, and beans. ? Limit foods that are high in fat and processed sugars, such as fried or sweet foods. ? Take an over-the-counter or prescription medicine for constipation. Lifestyle   Exercise as directed by your health care provider or physical therapist.  Practice relaxation techniques to control your stress. You may want to  try: ? Biofeedback. ? Visual imagery. ? Hypnosis. ? Muscle relaxation. ? Yoga. ? Meditation.  Maintain a healthy lifestyle. This includes eating a healthy diet and getting enough sleep.  Do not use any products that contain nicotine or tobacco, such as cigarettes and e-cigarettes. If you need help quitting, ask your health care provider. General instructions  Talk to your health care provider about complementary treatments, such as acupuncture or massage.  Consider joining a support group with others who are diagnosed with this condition.  Do not do activities that stress or strain your muscles. This includes repetitive motions and heavy lifting.  Keep all follow-up visits as told by your health care provider. This is important. Where to find more information  National Fibromyalgia Association: www.fmaware.org  Arthritis Foundation: www.arthritis.org  American Chronic Pain Association: www.theacpa.org Contact a health care provider if:  You have new symptoms.  Your symptoms get worse or your pain is severe.  You have side effects from your medicines.  You have trouble sleeping.  Your condition is causing depression or anxiety. Summary  Myofascial pain syndrome and fibromyalgia are pain disorders.  Myofascial pain syndrome has tender points in the muscle that will cause pain when pressed (trigger points). Fibromyalgia also has muscle pains and tenderness that come and go, but this condition is often associated with fatigue and sleep disturbances.  Fibromyalgia and myofascial pain syndrome are not the same but often occur together, causing  pain and fatigue that make day-to-day activities difficult.  Treatment for fibromyalgia includes taking medicines to relax the muscles and medicines for pain, depression, or seizures. Treatment for myofascial pain syndrome includes taking medicines for pain, cooling and stretching of muscles, and injecting medicines into trigger  points.  Follow your health care provider's instructions for taking medicines and maintaining a healthy lifestyle.    Stasis Dermatitis Stasis dermatitis is a long-term (chronic) skin condition that happens when veins can no longer pump blood back to the heart (poor circulation). This condition causes a red or brown scaly rash or sores (ulcers) from the pooling of blood (stasis). This condition usually affects the lower legs. It may affect one leg or both legs. Without treatment, severe stasis dermatitis can lead to other skin conditions and infections. What are the causes? This condition is caused by poor circulation. What increases the risk? You are more likely to develop this condition if:  You are not very active.  You stand for long periods of time.  You have veins that have become enlarged and twisted (varicose veins).  You have leg veins that are not strong enough to send blood back to the heart (venous insufficiency).  You have had a blood clot.  You have been pregnant many times.  You have had vein surgery.  You are obese.  You have heart or kidney failure.  You are 60 years of age or older.  You have had injuries to your legs in the past. What are the signs or symptoms? Common early symptoms of this condition include:  Itchiness in one or both of your legs.  Swelling in your ankle or leg. This might get better overnight but be worse again during the day.  Skin that looks thin on your ankle and leg.  Red or brown marks that develop slowly.  Skin that is dry, cracked, or easily irritated.  Red, swollen skin that is sore or has a burning feeling.  An achy or heavy feeling after you walk or stand for long periods of time.  Pain. Later and more severe symptoms of this condition include:  Skin that looks shiny.  Small, open sores (ulcers). These are often red or purple and leak fluid.  Skin that feels hard.  Severe itching.  A change in the shape or  color of your lower legs.  Severe pain.  Difficulty walking. How is this diagnosed? This condition may be diagnosed based on:  Your symptoms and medical history.  A physical exam. You may also have tests, including:  Blood tests.  Imaging tests to check blood flow (Doppler ultrasound).  Allergy tests. You may need to see a health care provider who specializes in skin diseases (dermatologist). How is this treated? This condition may be treated with:  Compression stockings or an elastic wrap to improve circulation.  Medicines, such as: ? Corticosteroid creams and ointments. ? Non-corticosteroid medicines applied to the skin (topical). ? Medicine to reduce swelling in the legs (diuretics). ? Antibiotics. ? Medicine to relieve itching (antihistamines).  A bandage (dressing).  A wrap that contains zinc and gelatin (Unna boot). Follow these instructions at home: Skin care  Moisturize your skin as told by your health care provider. Do not use moisturizers with fragrance. This can irritate your skin.  Apply a cool, wet cloth (cool compress) to the affected areas.  Do not scratch your skin.  Do not rub your skin dry after a bath or shower. Gently pat your skin dry.  Do not use scented soaps, detergents, or perfumes. Medicines  Take or use over-the-counter and prescription medicines only as told by your health care provider.  If you were prescribed an antibiotic medicine, take or use it as told by your health care provider. Do not stop taking or using the antibiotic even if your condition improves. Activity  Walk as told by your health care provider. Walking increases blood flow.  Do calf and ankle exercises throughout the day as told by your health care provider. This will help increase blood flow.  Raise (elevate) your legs above the level of your heart when you are sitting or lying down. Lifestyle  Work with your health care provider to lose weight, if  needed.  Do not cross your legs when you sit.  Do not stand or sit in one position for long periods of time.  Wear comfortable, loose-fitting clothing. Circulation in your legs will be worse if you wear tight pants, belts, and waistbands.  Do not use any products that contain nicotine or tobacco, such as cigarettes, e-cigarettes, and chewing tobacco. If you need help quitting, ask your health care provider. General instructions  If you were asked to use one of the following to help with your condition, follow instructions from your health care provider on how to: ? Remove and change any dressing. ? Wear compression stockings. These stockings help to prevent blood clots and reduce swelling in your legs. ? Wear the [employer].  Keep all follow-up visits as told by your health care provider. This is important. Contact a health care provider if:  Your condition does not improve with treatment.  Your condition gets worse.  You have signs of infection in the affected area. Watch for: ? Swelling. ? Tenderness. ? Redness. ? Soreness. ? Warmth.  You have a fever. Get help right away if:  You notice red streaks coming from the affected area.  Your bone or joint underneath the affected area becomes painful after the skin has healed.  The affected area turns darker.  You feel a deep pain in your leg or groin.  You are short of breath. Summary  Stasis dermatitis is a long-term (chronic) skin condition that happens when veins can no longer pump blood back to the heart (poor circulation).  Wear compression stockings as told by your health care provider. These stockings help to prevent blood clots and reduce swelling in your legs.  Follow instructions from your health care provider about activity, medicines, and lifestyle.  Contact a health care provider if you have a fever or have signs of infection in the affected area.  Keep all follow-up visits as told by your health care  provider. This is important. This information is not intended to replace advice given to you by your health care provider. Make sure you discuss any questions you have with your health care provider. Document Revised: 12/21/2017 Document Reviewed: 12/21/2017 Elsevier Patient Education  2020 [employer].

## 2020-06-27 LAB — CYCLIC CITRUL PEPTIDE ANTIBODY, IGG: Cyclic Citrullin Peptide Ab: <16 UNITS

## 2020-07-06 ENCOUNTER — Other Ambulatory Visit: Payer: Self-pay | Admitting: Internal Medicine

## 2020-07-06 MED FILL — FOLIC ACID 1 MG TABS: 1 | 30 days supply | Qty: 30 | Fill #1

## 2020-07-06 MED FILL — SYMBICORT 160-4.5 MCG INH: 160-4.5 | 30 days supply | Qty: 10 | Fill #7

## 2020-07-06 MED FILL — OXYBUTYNIN CL ER 10 MG TAB: 10 | 30 days supply | Qty: 30 | Fill #0

## 2020-07-11 ENCOUNTER — Telehealth: Payer: Self-pay

## 2020-07-11 NOTE — Telephone Encounter (Signed)
Pt called asking for pain meds for her leg. Asked pt to make an appointment she said she already talked to you about the pain and went to see rheumatology and they don't think its the issue with her leg.

## 2020-07-11 NOTE — Telephone Encounter (Signed)
She will need f/u evaluation to discuss further. I realize we started the work up at last appointment but I am unable to prescribe pain medication almost 2 months later without seeing her.   Marcy Siren, D.O. Primary Care at [facility]  07/11/2020, 3:31 PM

## 2020-07-11 NOTE — Progress Notes (Signed)
Lab test CCP antibodies are negative based on this and our clinic examination I do not see any evidence of rheumatoid arthritis. No specific treatment or workup recommended at this time.

## 2020-07-11 NOTE — Telephone Encounter (Signed)
Called pt lvm .

## 2020-07-12 ENCOUNTER — Encounter: Payer: Self-pay | Admitting: Internal Medicine

## 2020-07-12 ENCOUNTER — Other Ambulatory Visit: Payer: Self-pay | Admitting: Internal Medicine

## 2020-07-12 ENCOUNTER — Telehealth (INDEPENDENT_AMBULATORY_CARE_PROVIDER_SITE_OTHER): Payer: Medicaid Other | Admitting: Internal Medicine

## 2020-07-12 DIAGNOSIS — K219 Gastro-esophageal reflux disease without esophagitis: Secondary | ICD-10-CM | POA: Diagnosis not present

## 2020-07-12 DIAGNOSIS — R52 Pain, unspecified: Secondary | ICD-10-CM

## 2020-07-12 MED ORDER — MELOXICAM 15 MG PO TABS: 15.0000 mg | ORAL_TABLET | Freq: Every day | ORAL | 0 refills | Status: DC

## 2020-07-12 MED ORDER — PANTOPRAZOLE SODIUM 40 MG PO TBEC: 40.0000 mg | DELAYED_RELEASE_TABLET | Freq: Every day | ORAL | 3 refills | Status: DC

## 2020-07-12 MED ORDER — FUROSEMIDE 20 MG PO TABS
ORAL_TABLET | ORAL | 1 refills | Status: DC
Start: 2020-07-12 — End: 2020-07-12

## 2020-07-12 MED FILL — FUROSEMIDE 20 MG TABS: 20 | 90 days supply | Qty: 90 | Fill #0

## 2020-07-12 MED FILL — MELOXICAM 15 MG TABLET: 15 | 30 days supply | Qty: 30 | Fill #0

## 2020-07-12 MED FILL — PANTOPRAZOLE SOD DR 40 MG T: 40 | 30 days supply | Qty: 30 | Fill #0

## 2020-07-12 NOTE — Progress Notes (Signed)
Virtual Visit via Telephone Note  I connected with [name], on 07/12/2020 at 10:56 AM by telephone due to the COVID-19 pandemic and verified that I am speaking with the correct person using two identifiers.   Consent: I discussed the limitations, risks, security and privacy concerns of performing an evaluation and management service by telephone and the availability of in person appointments. I also discussed with the patient that there may be a patient responsible charge related to this service. The patient expressed understanding and agreed to proceed.   Location of Patient: Home   Location of Provider: Clinic    Persons participating in Telemedicine visit: [facility] Dr. Earlene Plater      History of Present Illness: Patient presenting for diffuse pain, currently worse in the feet and legs. At last visit in Oct she complained of pain throughout her shoulders, arms, and legs. Labs were obtained and showed +RF and she was referred to rheumatology where she had a negative further work up and RA was ruled out. Rheum thought pain was related to venous stasis dermatitis and recommended compression stockings and elevation. She reports that her edema does not improve with elevation or compression. She still endorses sharp pain from knees down bilaterally.    Past Medical History:  Diagnosis Date  . COPD (chronic obstructive pulmonary disease) (HCC)   . Hypertension    No Known Allergies  Current Outpatient Medications on File Prior to Visit  Medication Sig Dispense Refill  . Albuterol Sulfate 2.5 MG/0.5ML NEBU Inhale 2.5 mg into the lungs every 6 (six) hours as needed (sob and wheezing).    Marland Kitchen amLODipine (NORVASC) 10 MG tablet Take 1 tablet (10 mg total) by mouth daily. 90 tablet 3  . budesonide-formoterol (SYMBICORT) 160-4.5 MCG/ACT inhaler Inhale 2 puffs into the lungs 2 (two) times daily. 1 Inhaler 11  . folic acid (FOLVITE) 1 MG tablet TAKE 1 TABLET BY MOUTH  DAILY. 90 tablet 0  . furosemide (LASIX) 20 MG tablet TAKE 1 TABLET(20 MG) BY MOUTH DAILY 90 tablet 1  . hydrOXYzine (ATARAX/VISTARIL) 50 MG tablet Take 1 tablet (50 mg total) by mouth 3 (three) times daily as needed for anxiety. 270 tablet 1  . ibuprofen (ADVIL) 600 MG tablet Take 1 tablet (600 mg total) by mouth 2 (two) times daily as needed. 60 tablet 1  . oxybutynin (DITROPAN-XL) 10 MG 24 hr tablet TAKE 1 TABLET (10 MG TOTAL) BY MOUTH AT BEDTIME. 30 tablet 2  . pantoprazole (PROTONIX) 40 MG tablet Take 1 tablet (40 mg total) by mouth daily. 30 tablet 3  . QUEtiapine (SEROQUEL) 200 MG tablet Take 1 tablet (200 mg total) by mouth at bedtime. 90 tablet 1  . sertraline (ZOLOFT) 100 MG tablet Take 1 tablet (100 mg total) by mouth daily with breakfast. 90 tablet 1   No current facility-administered medications on file prior to visit.    Observations/Objective: NAD. Speaking clearly.  Work of breathing normal.  Alert and oriented. Mood appropriate.   Assessment and Plan: 1. Diffuse pain Unclear etiology. Patient has had extensive work up. Has been seen by [facility] for concern of venous stasis as etiology; however, they found presentation to be unremarkable. She had no evidence of significant arterial insufficiency. Normal venous duplex studies were also obtained. At follow up with this provider, labs were obtained which showed +RF and mildly elevated CRP. Patient was referred to rheumatology. Negative CCP and hand imaging were obtained and thought not to be related to rheumatoid arthritis.  Given diffuse pain, I am concerned for fibromyalgia as etiology. Would consider WPI scoring system and adjustment of medication regimen if concerns persist. Likewise, could consider spine imaging for DDD as etiology of lower extremity pain; however, patient also has concerns about UE pain. Agreeable for pain specialist referral. Anti-inflammatory medication for now.  - meloxicam (MOBIC) 15 MG tablet; Take 1 tablet (15  mg total) by mouth daily.  Dispense: 30 tablet; Refill: 0 - Ambulatory referral to Pain Clinic  2. Gastroesophageal reflux disease without esophagitis - pantoprazole (PROTONIX) 40 MG tablet; Take 1 tablet (40 mg total) by mouth daily.  Dispense: 30 tablet; Refill: 3   Follow Up Instructions: PRN and for routine medical care    I discussed the assessment and treatment plan with the patient. The patient was provided an opportunity to ask questions and all were answered. The patient agreed with the plan and demonstrated an understanding of the instructions.   The patient was advised to call back or seek an in-person evaluation if the symptoms worsen or if the condition fails to improve as anticipated.     I provided 18 minutes total of non-face-to-face time during this encounter including median intraservice time, reviewing previous notes, investigations, ordering medications, medical decision making, coordinating care and patient verbalized understanding at the end of the visit.    Marcy Siren, D.O. Primary [facility] at [facility]  07/12/2020, 10:56 AM

## 2020-07-22 ENCOUNTER — Other Ambulatory Visit: Payer: Self-pay

## 2020-07-22 ENCOUNTER — Ambulatory Visit
Admission: EM | Admit: 2020-07-22 | Discharge: 2020-07-22 | Disposition: A | Discharge status: Home / Self Care | Payer: Medicaid Other | Attending: Family Medicine | Admitting: Family Medicine

## 2020-07-22 DIAGNOSIS — M25551 Pain in right hip: Secondary | ICD-10-CM

## 2020-07-22 MED ORDER — PREDNISONE 5 MG PO TABS: ORAL_TABLET | ORAL | 0 refills | Status: DC

## 2020-07-22 NOTE — ED Provider Notes (Signed)
[facility]    CSN: [id number] Arrival date & time: 07/22/20  0940      History   Chief Complaint Chief Complaint  Patient presents with  . Back Pain    X 3 days  . Hip Pain    X 3 days    HPI [name] is a 59 y.o. female.   She is presenting with right lateral hip pain for the past few days.  She is able to stand but has pain with walking.  Denies any injury or inciting event.  No history of similar pain.  No history of surgery.  Has tried meloxicam.  Seems localized to the lateral aspect of the right hip.  HPI  Past Medical History:  Diagnosis Date  . COPD (chronic obstructive pulmonary disease) (HCC)   . Hypertension     Patient Active Problem List   Diagnosis Date Noted  . Rheumatoid factor positive 06/26/2020  . CRP elevated 06/26/2020  . Bilateral hand pain 06/26/2020  . Stasis dermatitis of both legs 06/26/2020  . MDD (major depressive disorder), recurrent, in partial remission (HCC) 04/30/2020  . Alcohol use disorder, severe, in sustained remission (HCC) 04/30/2020  . Anxiety 04/30/2020  . Chronic sinusitis 04/10/2020  . Liver cirrhosis (HCC) 03/08/2020  . Major depressive disorder, recurrent episode, moderate with anxious distress (HCC) 02/11/2020  . Alcohol abuse, in remission 12/16/2019  . Nicotine dependence, cigarettes, uncomplicated 12/16/2019  . Generalized anxiety disorder 12/16/2019    Past Surgical History:  Procedure Laterality Date  . ABDOMINAL HYSTERECTOMY    . CARPAL TUNNEL RELEASE Bilateral    Per patient  . SHOULDER SURGERY     Per patient    OB History   No obstetric history on file.      Home Medications    Prior to Admission medications   Medication Sig Start Date End Date Taking? Authorizing Provider  Albuterol Sulfate 2.5 MG/0.5ML NEBU Inhale 2.5 mg into the lungs every 6 (six) hours as needed (sob and wheezing).   Yes [provider]  amLODipine (NORVASC) 10 MG tablet Take 1 tablet (10 mg  total) by mouth daily. 12/05/19  Yes Grayce Sessions, NP  budesonide-formoterol (SYMBICORT) 160-4.5 MCG/ACT inhaler Inhale 2 puffs into the lungs 2 (two) times daily. 12/13/19  Yes Grayce Sessions, NP  folic acid (FOLVITE) 1 MG tablet TAKE 1 TABLET BY MOUTH DAILY. 06/01/20  Yes Arvilla Market, DO  furosemide (LASIX) 20 MG tablet TAKE 1 TABLET(20 MG) BY MOUTH DAILY 07/12/20  Yes Arvilla Market, DO  hydrOXYzine (ATARAX/VISTARIL) 50 MG tablet Take 1 tablet (50 mg total) by mouth 3 (three) times daily as needed for anxiety. 04/30/20  Yes Zena Amos, MD  ibuprofen (ADVIL) 600 MG tablet Take 1 tablet (600 mg total) by mouth 2 (two) times daily as needed. 05/22/20  Yes Arvilla Market, DO  meloxicam (MOBIC) 15 MG tablet Take 1 tablet (15 mg total) by mouth daily. 07/12/20  Yes Arvilla Market, DO  oxybutynin (DITROPAN-XL) 10 MG 24 hr tablet TAKE 1 TABLET (10 MG TOTAL) BY MOUTH AT BEDTIME. 07/06/20  Yes Arvilla Market, DO  pantoprazole (PROTONIX) 40 MG tablet Take 1 tablet (40 mg total) by mouth daily. 07/12/20  Yes Arvilla Market, DO  QUEtiapine (SEROQUEL) 200 MG tablet Take 1 tablet (200 mg total) by mouth at bedtime. 04/30/20  Yes Zena Amos, MD  sertraline (ZOLOFT) 100 MG tablet Take 1 tablet (100 mg total) by mouth daily with  breakfast. 04/30/20  Yes Zena Amos, MD  predniSONE (DELTASONE) 5 MG tablet Take 6 pills for first day, 5 pills second day, 4 pills third day, 3 pills fourth day, 2 pills the fifth day, and 1 pill sixth day. 07/22/20   Myra Rude, MD    Family History Family History  Problem Relation Age of Onset  . Ulcers Father   . Hypotension Sister   . Hypertension Brother     Social History Social History   Tobacco Use  . Smoking status: Current Every Day Smoker    Packs/day: 1.00    Years: 25.00    Pack years: 25.00    Types: Cigarettes    Start date: 08/04/1978  . Smokeless tobacco: Never Used   Vaping Use  . Vaping Use: Never used  Substance Use Topics  . Alcohol use: Not Currently  . Drug use: Never     Allergies   Patient has no known allergies.   Review of Systems Review of Systems  See HPI  Physical Exam Triage Vital Signs ED Triage Vitals  Enc Vitals Group     BP 07/22/20 0953 (!) 155/74     Pulse Rate 07/22/20 0953 91     Resp 07/22/20 0953 20     Temp 07/22/20 0953 98 F (36.7 C)     Temp Source 07/22/20 0953 Oral     SpO2 07/22/20 0953 94 %     Weight --      Height --      Head Circumference --      Peak Flow --      Pain Score 07/22/20 0958 3     Pain Loc --      Pain Edu? --      Excl. in GC? --    No data found.  Updated Vital Signs BP (!) 155/74 (BP Location: Right Arm)   Pulse 91   Temp 98 F (36.7 C) (Oral)   Resp 20   SpO2 94%   Visual Acuity Right Eye Distance:   Left Eye Distance:   Bilateral Distance:    Right Eye Near:   Left Eye Near:    Bilateral Near:     Physical Exam Gen: NAD, alert, cooperative with exam, well-appearing ENT: normal lips, normal nasal mucosa,  Eye: normal EOM, normal conjunctiva and lids  Neuro: normal tone, normal sensation to touch Psych:  normal insight, alert and oriented MSK:  Right leg: Tenderness to palpation of the greater trochanter. Able to stand but has pain with ambulation. Normal strength with hip flexion. Normal internal and external rotation of the hip. Normal plantar flexion and dorsiflexion. Significant pitting edema bilaterally. Neurovascular intact   UC Treatments / Results  Labs (all labs ordered are listed, but only abnormal results are displayed) Labs Reviewed - No data to display  EKG   Radiology No results found.  Procedures Procedures (including critical care time)  Medications Ordered in UC Medications - No data to display  Initial Impression / Assessment and Plan / UC Course  I have reviewed the triage vital signs and the nursing  notes.  Pertinent labs & imaging results that were available during my care of the patient were reviewed by me and considered in my medical decision making (see chart for details).     [name] is a 59 year old female that is presenting with symptoms suggestive of greater trochanteric pain syndrome.  Seems less likely for radicular in nature.  Has good range of  motion seems less likely for joint related.  Will provide with prednisone.  Counseled on supportive care.  Given indications on follow-up.  Final Clinical Impressions(s) / UC Diagnoses   Final diagnoses:  Greater trochanteric pain syndrome of right lower extremity     Discharge Instructions     Please try ice or heat  Please perform gently range of motion movements  Please follow up if your symptoms fail to improve.     ED Prescriptions    Medication Sig Dispense Auth. Provider   predniSONE (DELTASONE) 5 MG tablet Take 6 pills for first day, 5 pills second day, 4 pills third day, 3 pills fourth day, 2 pills the fifth day, and 1 pill sixth day. 21 tablet Myra Rude, MD     PDMP not reviewed this encounter.   Myra Rude, MD 07/22/20 1023

## 2020-07-22 NOTE — ED Triage Notes (Signed)
Patient states she has had hip pain primarily with some lower back pain. Pt states no injury. Pt states she had called her primary care doc who sent in a prescription for advil 600 but that is not helping. Pt is aox4 and ambulatory.

## 2020-07-22 NOTE — Discharge Instructions (Signed)
Please try ice or heat  Please perform gently range of motion movements  Please follow up if your symptoms fail to improve.

## 2020-07-24 ENCOUNTER — Other Ambulatory Visit: Payer: Self-pay

## 2020-07-24 ENCOUNTER — Ambulatory Visit: Payer: Self-pay

## 2020-07-24 ENCOUNTER — Ambulatory Visit (INDEPENDENT_AMBULATORY_CARE_PROVIDER_SITE_OTHER): Payer: Medicaid Other | Admitting: Family Medicine

## 2020-07-24 VITALS — BP 154/78 | Ht 62.0 in | Wt 170.0 lb

## 2020-07-24 DIAGNOSIS — M25552 Pain in left hip: Secondary | ICD-10-CM | POA: Diagnosis not present

## 2020-07-24 DIAGNOSIS — M25551 Pain in right hip: Secondary | ICD-10-CM

## 2020-07-24 NOTE — Assessment & Plan Note (Signed)
She has nonspecific tissue changes in the gluteus as well as a significant hyperemia in the subcutaneous laterally. Unclear if these are related or incidental finding in the gluteus. She does have elevations in inflammatory markers but no specific point diagnosis as of late. Seems less likely for polymyalgia -Counseled on home exercise therapy and supportive care. -Continue prednisone. -Uric acid, sed rate and CRP -Right hip x-ray and lumbar x-ray -Could consider injection

## 2020-07-24 NOTE — Patient Instructions (Signed)
Good to see you  Please try heat  Please try the exercises  I will call with the results from today  Please send me a message in MyChart with any questions or updates.  Please see me back in 4 weeks.   --Dr. Jordan Likes

## 2020-07-24 NOTE — Progress Notes (Signed)
[name] - 59 y.o. female MRN [id number]  Date of birth: 04-Sep-1960  SUBJECTIVE:  Including CC & ROS.  No chief complaint on file.   [name] is a 59 y.o. female that is she is presenting with acute on chronic right hip pain.  She was seen in urgent care on Sunday and provided prednisone.  She has gotten some improvement with the prednisone today.  The pain is occurring over the lateral aspect of the hip.  It does have some giving way motions when she gets up from a seated position and begins to walk.  No history of surgery.  No specific injury or inciting event.   Review of Systems See HPI   HISTORY: Past Medical, Surgical, Social, and Family History Reviewed & Updated per EMR.   Pertinent Historical Findings include:  Past Medical History:  Diagnosis Date  . COPD (chronic obstructive pulmonary disease) (HCC)   . Hypertension     Past Surgical History:  Procedure Laterality Date  . ABDOMINAL HYSTERECTOMY    . CARPAL TUNNEL RELEASE Bilateral    Per patient  . SHOULDER SURGERY     Per patient    Family History  Problem Relation Age of Onset  . Ulcers Father   . Hypotension Sister   . Hypertension Brother     Social History   Socioeconomic History  . Marital status: Single    Spouse name: Not on file  . Number of children: 0  . Years of education: 74  . Highest education level: GED or equivalent  Occupational History  . Occupation: Armed forces operational officer  Tobacco Use  . Smoking status: Current Every Day Smoker    Packs/day: 1.00    Years: 25.00    Pack years: 25.00    Types: Cigarettes    Start date: 08/04/1978  . Smokeless tobacco: Never Used  Vaping Use  . Vaping Use: Never used  Substance and Sexual Activity  . Alcohol use: Not Currently  . Drug use: Never  . Sexual activity: Never  Other Topics Concern  . Not on file  Social History Narrative   Lives and step-dad, they are able to help.   Social Determinants of Health   Financial Resource Strain:  Not on file  Food Insecurity: Not on file  Transportation Needs: Not on file  Physical Activity: Not on file  Stress: Not on file  Social Connections: Not on file  Intimate Partner Violence: Not on file     PHYSICAL EXAM:  VS: BP (!) 154/78   Ht 5\' 2"  (1.575 m)   Wt 170 lb (77.1 kg)   BMI 31.09 kg/m  Physical Exam Gen: NAD, alert, cooperative with exam, well-appearing MSK:  Right hip: Tenderness palpation over the greater trochanter. Weakness with hip abduction. Palpable mass appreciated in the right gluteus. No swelling or ecchymosis. Neurovascularly intact  Limited ultrasound: Right hip:  The posterior portion of the gluteus in the mid buttock appreciated a palpable mass that shows hyperechoic change on ultrasound with no hyperemia but does caste and acoustic shadowing.  It appears to be in the subcutaneous tissue. The greater trochanter was appreciated with no changes at the insertion. There is increased hyperemia over the lateral hip in the subcutaneous that extends down to the fascia of the IT band.  Summary: Nonspecific findings in the gluteus tumor present hyperdense tissue and increased hyperemia in the subcutaneous laterally of the hip  Ultrasound and interpretation by , MD    ASSESSMENT & PLAN:  Greater trochanteric pain syndrome of left lower extremity She has nonspecific tissue changes in the gluteus as well as a significant hyperemia in the subcutaneous laterally. Unclear if these are related or incidental finding in the gluteus. She does have elevations in inflammatory markers but no specific point diagnosis as of late. Seems less likely for polymyalgia -Counseled on home exercise therapy and supportive care. -Continue prednisone. -Uric acid, sed rate and CRP -Right hip x-ray and lumbar x-ray -Could consider injection

## 2020-07-25 ENCOUNTER — Telehealth: Payer: Self-pay | Admitting: Family Medicine

## 2020-07-25 LAB — URIC ACID: Uric Acid: 4.4 mg/dL (ref 3.0–7.2)

## 2020-07-25 LAB — C-REACTIVE PROTEIN: CRP: 22 mg/L — ABNORMAL HIGH (ref 0–10)

## 2020-07-25 LAB — SEDIMENTATION RATE: Sed Rate: 7 mm/hr (ref 0–40)

## 2020-07-25 NOTE — Telephone Encounter (Signed)
Informed of results.   Myra Rude, MD [facility] 07/25/2020, 8:48 AM

## 2020-07-26 ENCOUNTER — Encounter: Payer: Self-pay | Admitting: Family Medicine

## 2020-07-31 MED FILL — SERTRALINE HCL 100 MG TAB: 100 | 90 days supply | Qty: 90 | Fill #1

## 2020-07-31 MED FILL — OXYBUTYNIN CL ER 10 MG TAB: 10 | 30 days supply | Qty: 30 | Fill #1

## 2020-07-31 MED FILL — SYMBICORT 160-4.5 MCG INH: 160-4.5 | 30 days supply | Qty: 10 | Fill #8

## 2020-07-31 MED FILL — QUETIAPINE FUMARATE 200 MG: 200 | 90 days supply | Qty: 90 | Fill #1

## 2020-07-31 MED FILL — FOLIC ACID 1 MG TABS: 1 | 30 days supply | Qty: 30 | Fill #2

## 2020-08-14 ENCOUNTER — Other Ambulatory Visit: Payer: Self-pay

## 2020-08-14 ENCOUNTER — Ambulatory Visit (HOSPITAL_BASED_OUTPATIENT_CLINIC_OR_DEPARTMENT_OTHER)
Admission: RE | Admit: 2020-08-14 | Discharge: 2020-08-14 | Disposition: A | Discharge status: Home / Self Care | Payer: Medicaid Other | Source: Ambulatory Visit | Attending: Family Medicine | Admitting: Family Medicine

## 2020-08-14 ENCOUNTER — Ambulatory Visit (INDEPENDENT_AMBULATORY_CARE_PROVIDER_SITE_OTHER): Payer: Medicaid Other | Admitting: Family Medicine

## 2020-08-14 DIAGNOSIS — M25552 Pain in left hip: Secondary | ICD-10-CM | POA: Diagnosis not present

## 2020-08-14 IMAGING — DX DG LUMBAR SPINE 2-3V
3 series · 3 of 3 positions shown · non-contrast
Comparison: None.

CLINICAL DATA: Back pain

EXAM:
LUMBAR SPINE - 2-3 VIEW

[l-spine ap]
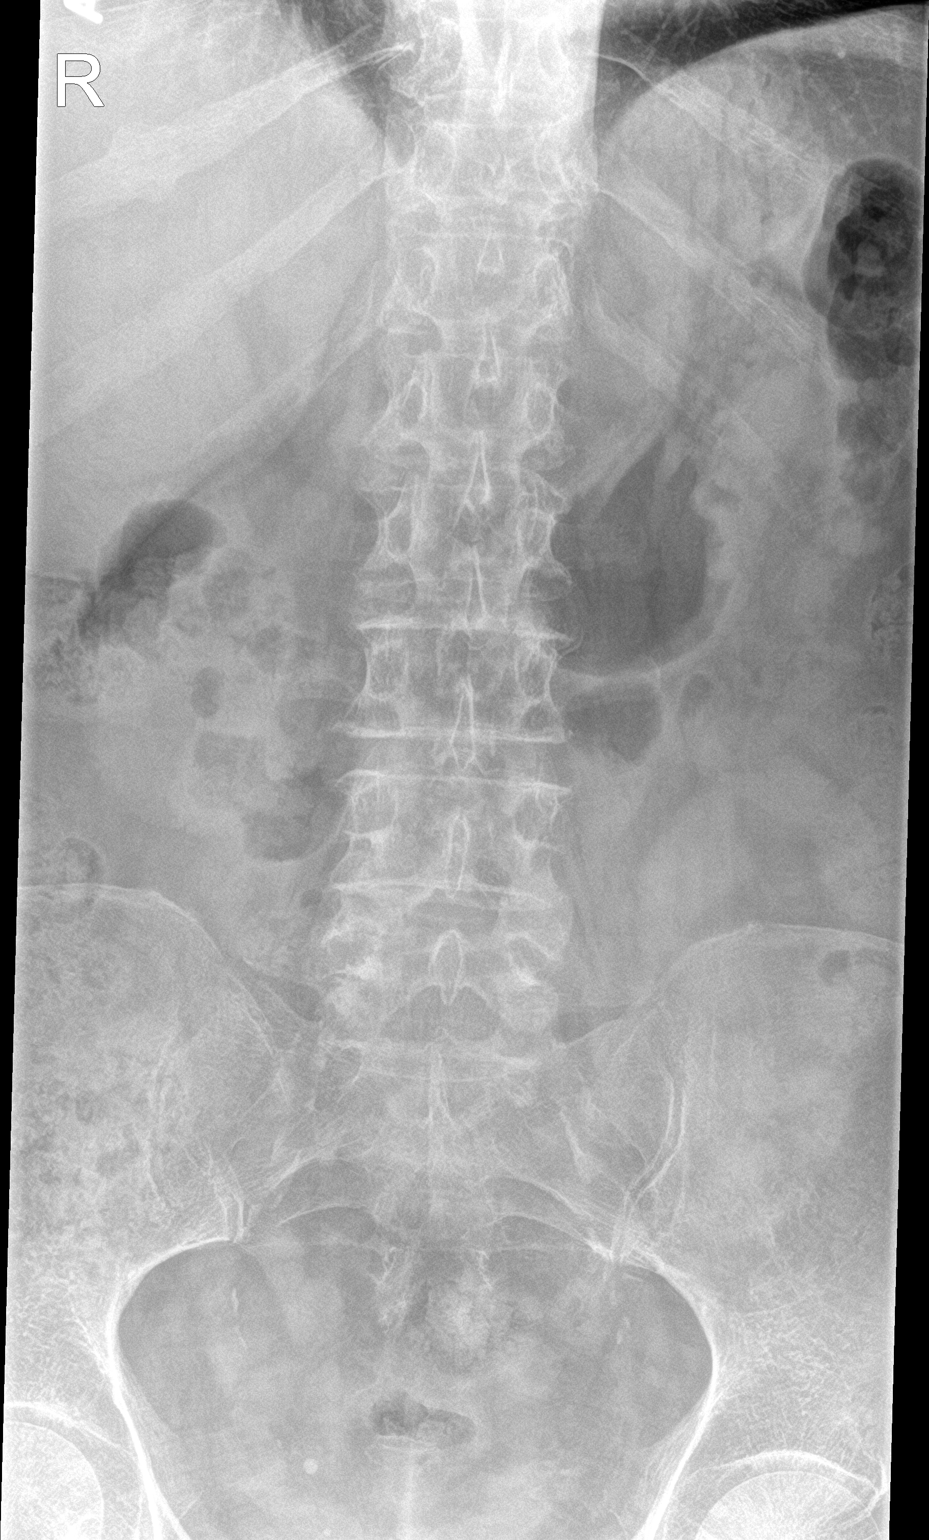

[l-spine lat]
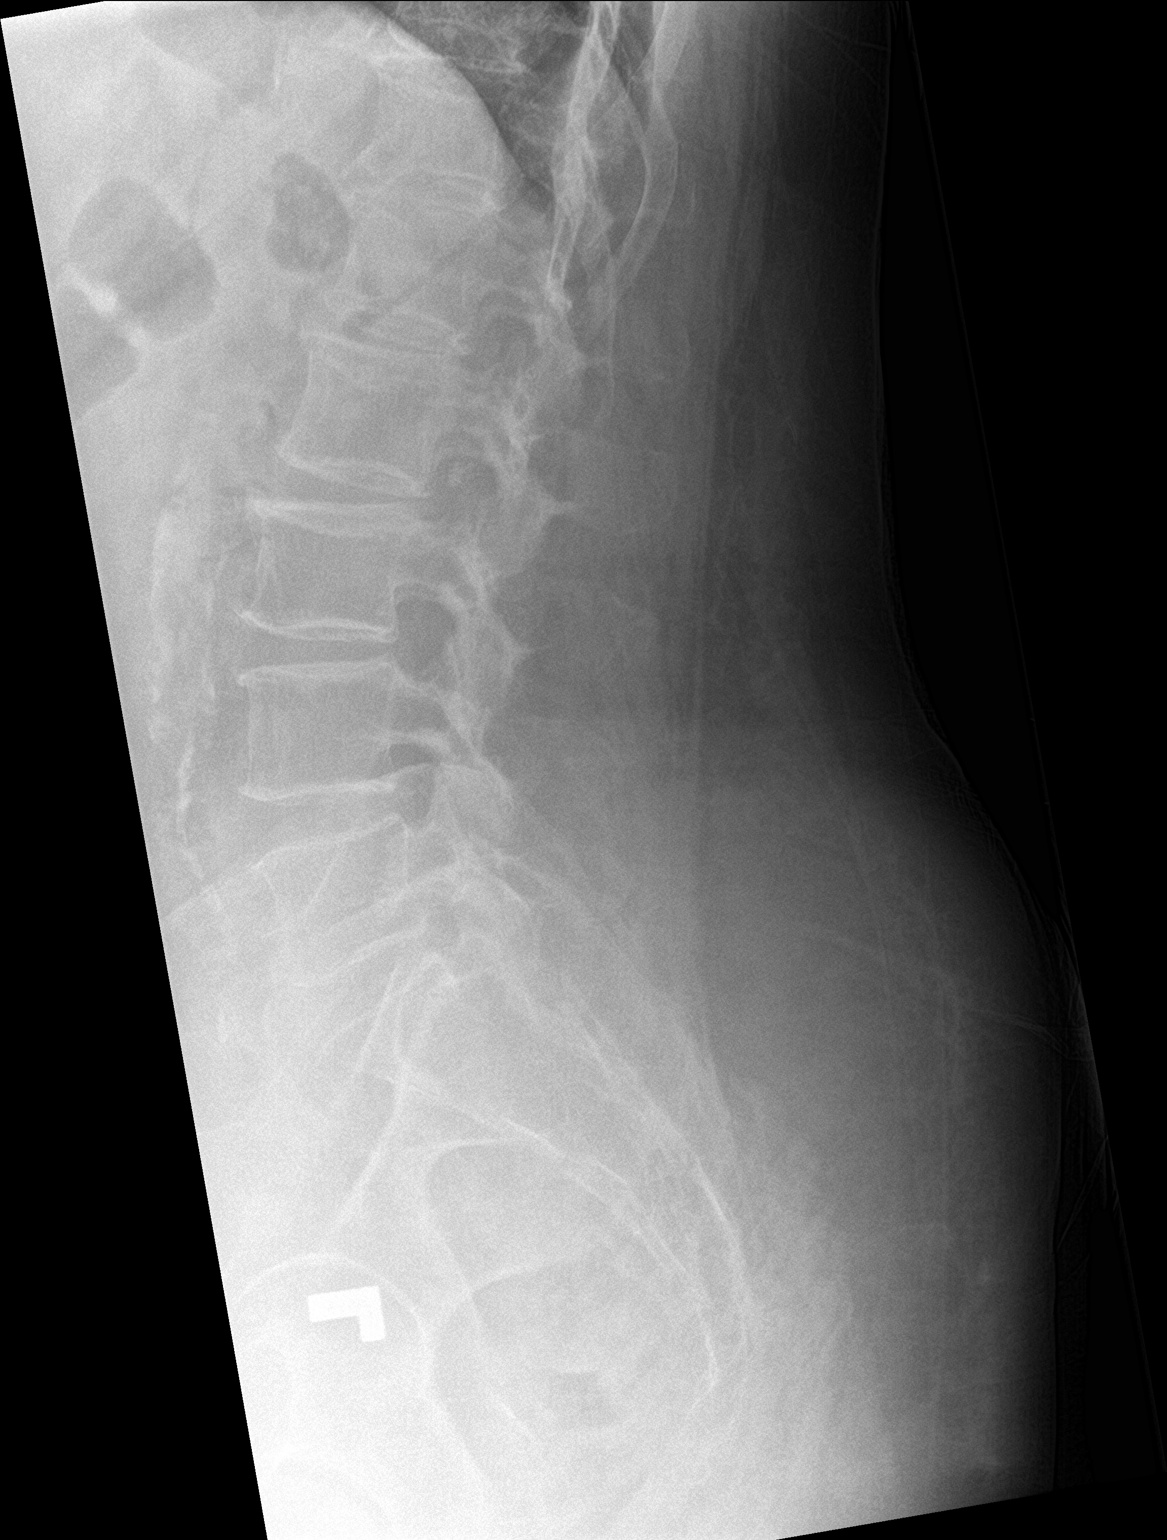

[l-spine spot]
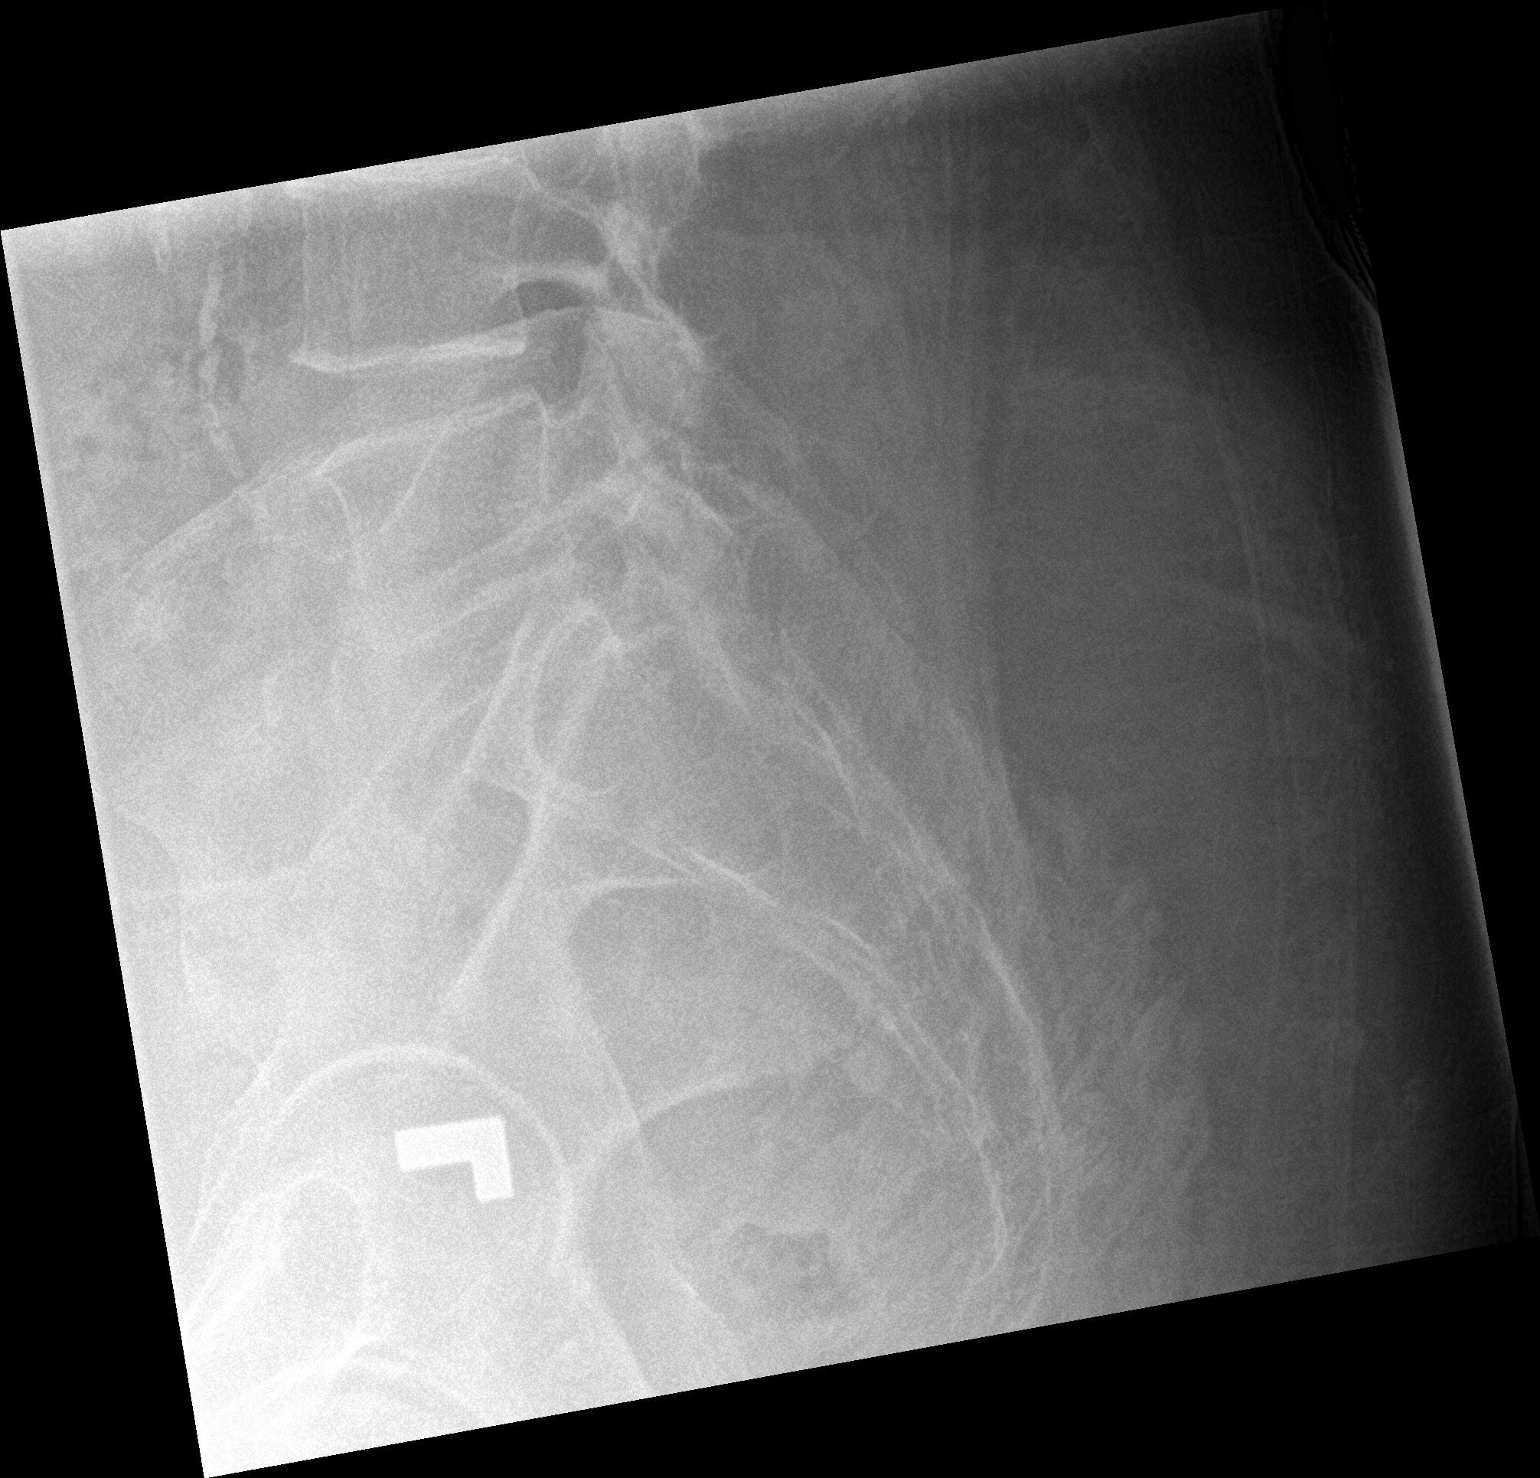

[3 of 3 positions shown; findings below may reference images not displayed]

FINDINGS: Multilevel disc height loss is noted throughout the lumbar spine.
There is facet arthrosis in the lower lumbar segments. There is no
acute compression fracture. Vascular calcifications are noted.
IMPRESSION: Multilevel degenerative changes of the lumbar spine. No acute
compression fracture.

## 2020-08-14 IMAGING — DX DG HIP (WITH OR WITHOUT PELVIS) 2-3V*R*
3 series · 3 of 3 positions shown · non-contrast
Comparison: None.

CLINICAL DATA: Right-sided hip pain.

EXAM:
DG HIP (WITH OR WITHOUT PELVIS) 2-3V RIGHT

[pelvis ap]
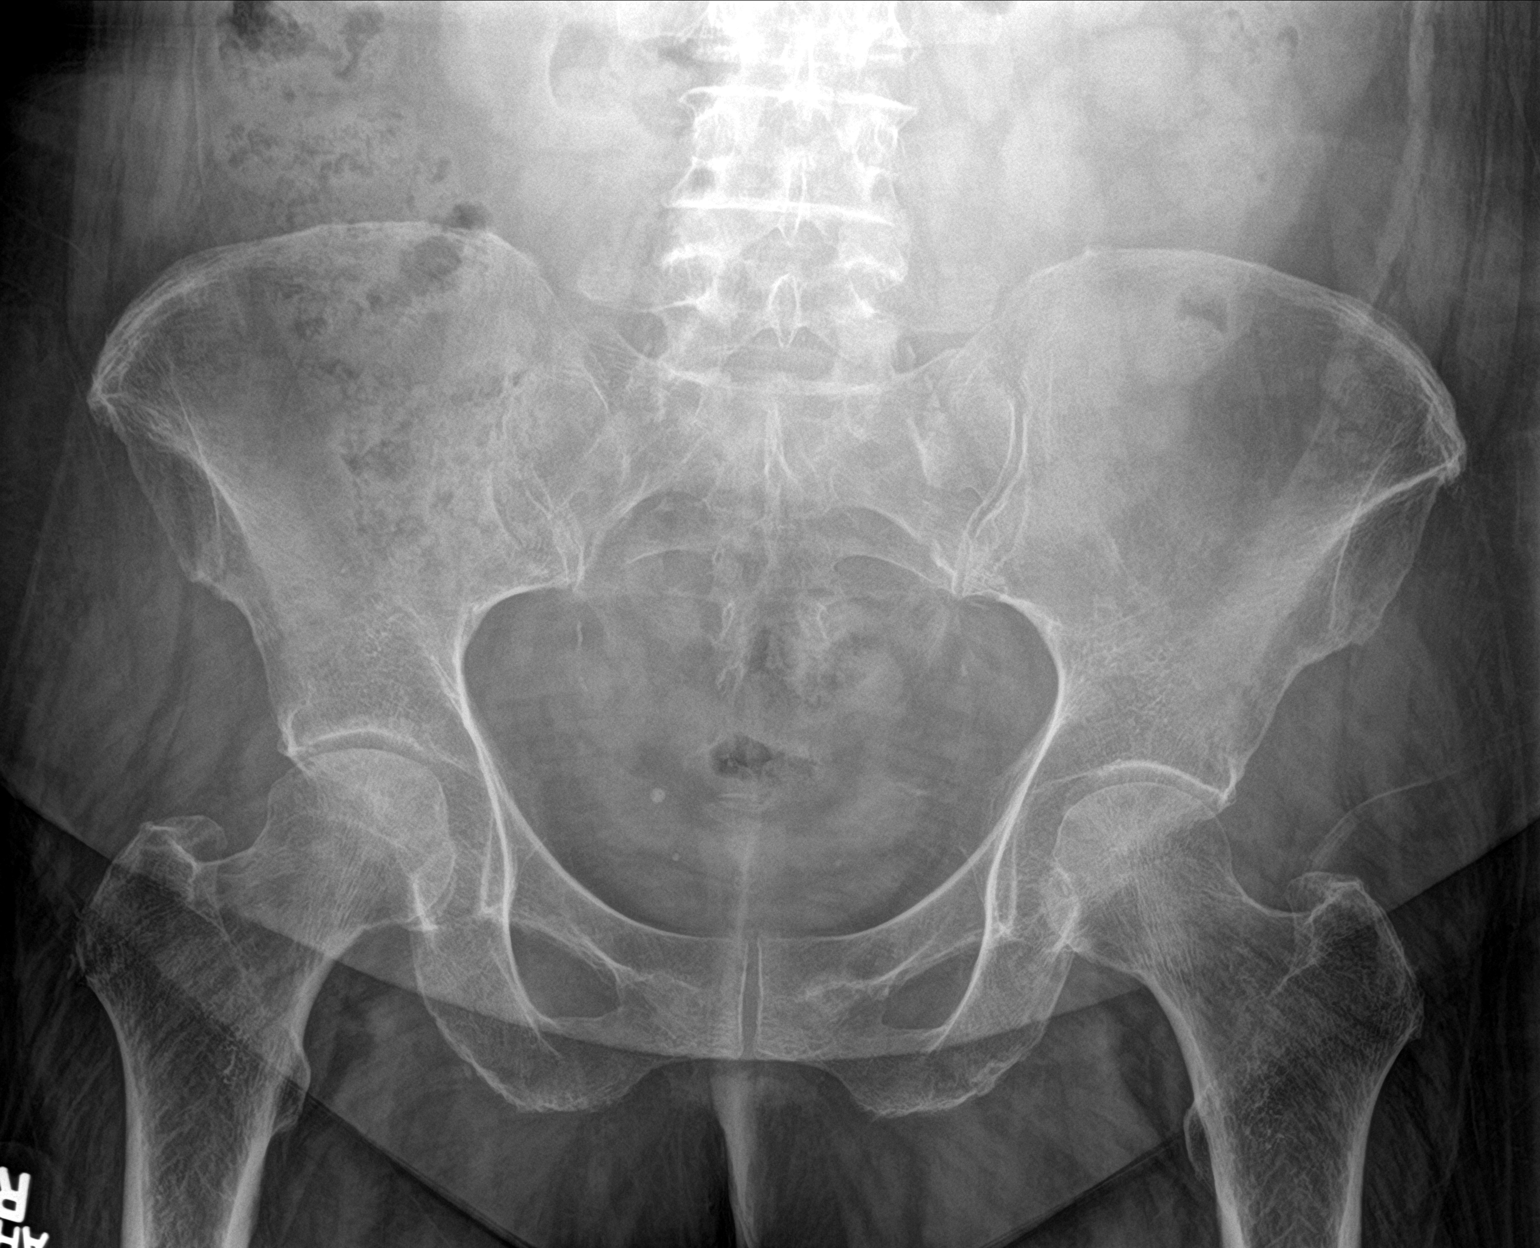

[hip ap]
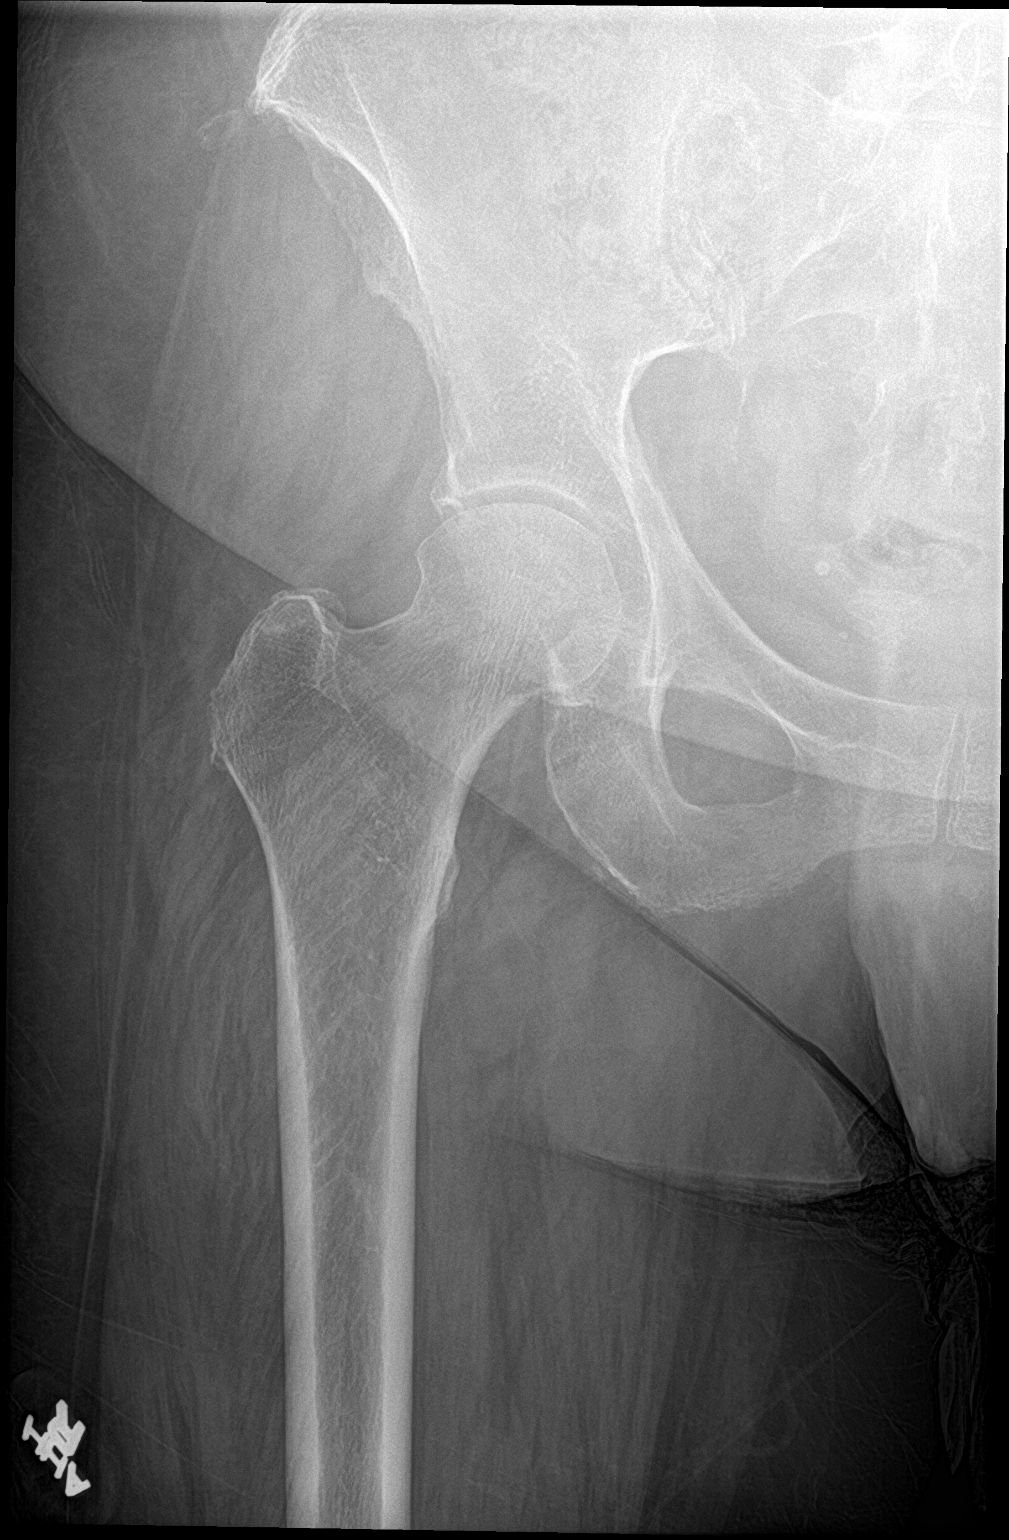

[hip lat]
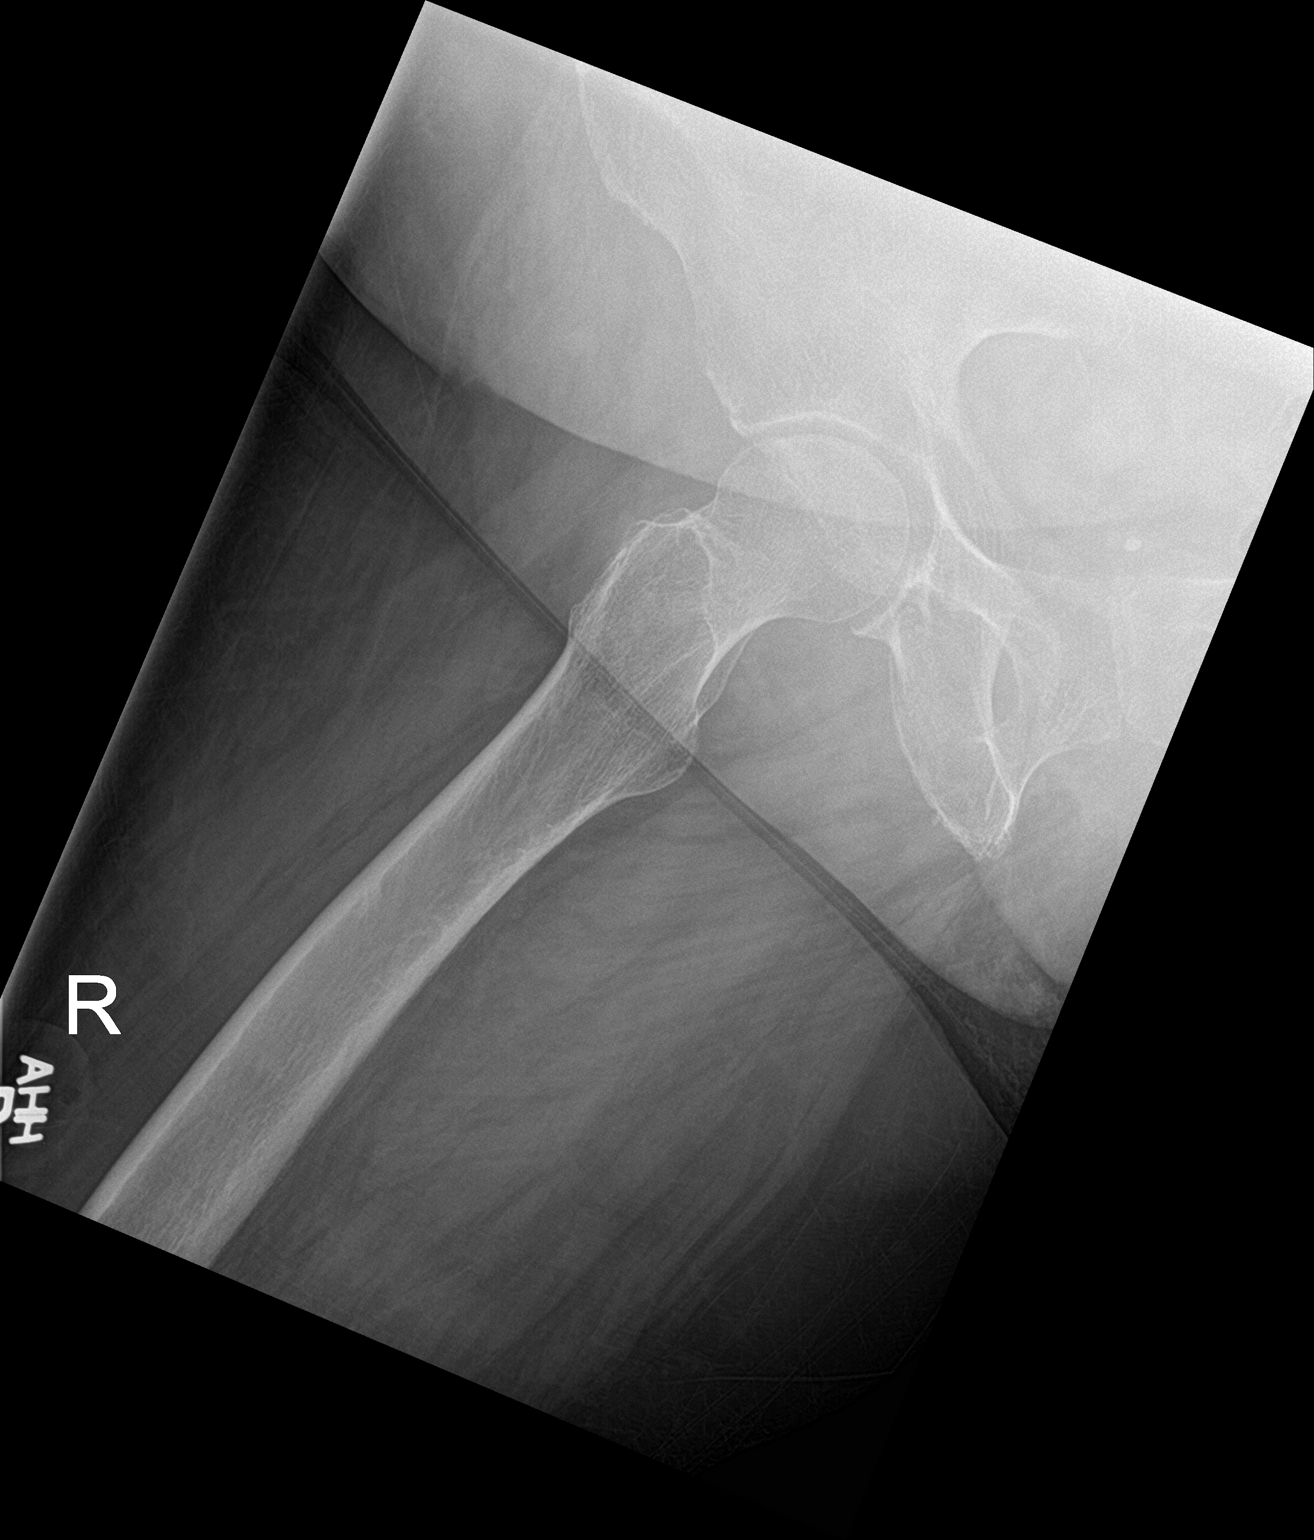

[3 of 3 positions shown; findings below may reference images not displayed]

FINDINGS: There are mild degenerative changes of both hips. There is no acute
displaced fracture or dislocation. There is osteopenia. There are
calcifications projecting over the patient's pelvis that are favored
to represent phleboliths.
IMPRESSION: Mild degenerative changes of both hips. No acute displaced fracture
or dislocation.

## 2020-08-14 NOTE — Progress Notes (Signed)
  [name] - 60 y.o. female MRN [id number]  Date of birth: [birth year]  SUBJECTIVE:  Including CC & ROS.  No chief complaint on file.   [name] is a 60 y.o. female that is following up for her right hip and leg pain. Has had improvement with the prednisone but the pain returned. Still having trouble with walking of any distance. Pain occurring posteriorly. She also feels like the right leg will give way.   Review of Systems See HPI   HISTORY: Past Medical, Surgical, Social, and Family History Reviewed & Updated per EMR.   Pertinent Historical Findings include:  Past Medical History:  Diagnosis Date  . COPD (chronic obstructive pulmonary disease) (HCC)   . Hypertension     Past Surgical History:  Procedure Laterality Date  . ABDOMINAL HYSTERECTOMY    . CARPAL TUNNEL RELEASE Bilateral    Per patient  . SHOULDER SURGERY     Per patient    Family History  Problem Relation Age of Onset  . Ulcers Father   . Hypotension Sister   . Hypertension Brother     Social History   Socioeconomic History  . Marital status: Single    Spouse name: Not on file  . Number of children: 0  . Years of education: 65  . Highest education level: GED or equivalent  Occupational History  . Occupation: Armed forces operational officer  Tobacco Use  . Smoking status: Current Every Day Smoker    Packs/day: 1.00    Years: 25.00    Pack years: 25.00    Types: Cigarettes    Start date: 08/04/1978  . Smokeless tobacco: Never Used  Vaping Use  . Vaping Use: Never used  Substance and Sexual Activity  . Alcohol use: Not Currently  . Drug use: Never  . Sexual activity: Never  Other Topics Concern  . Not on file  Social History Narrative   Lives and step-dad, they are able to help.   Social Determinants of Health   Financial Resource Strain: Not on file  Food Insecurity: Not on file  Transportation Needs: Not on file  Physical Activity: Not on file  Stress: Not on file  Social Connections: Not on  file  Intimate Partner Violence: Not on file     PHYSICAL EXAM:  VS: BP (!) 160/80   Ht 5\' 3"  (1.6 m)   Wt 200 lb (90.7 kg)   BMI 35.43 kg/m  Physical Exam Gen: NAD, alert, cooperative with exam, well-appearing   ASSESSMENT & PLAN:   Greater trochanteric pain syndrome of left lower extremity Thank you more associated with the joint as it does circumferentially the hip itself. Also having weakness on the thigh. -Counseled on home exercise therapy supportive care. -Rayos samples. -Consider hip injection versus physical therapy or further imaging.

## 2020-08-14 NOTE — Patient Instructions (Signed)
Good to see you Please try the rayos. Please try to take it as close to 10 pm as you can.  Please get the xrays. I'll call with the results.  Please let me know how you do on the rayos.   Please send me a message in MyChart with any questions or updates.  Please see me back in 4 weeks.   --Dr. Jordan Likes

## 2020-08-14 NOTE — Progress Notes (Signed)
Medication Samples have been provided to the patient.  Drug name: rayos       Strength: 5mg         Qty: 3 bxs  LOT: B  Exp.Date: 7/22  Dosing instructions: Take one tab at 10pm  The patient has been instructed regarding the correct time, dose, and frequency of taking this medication, including desired effects and most common side effects.   Necole Minassian 2:10 PM 08/14/2020

## 2020-08-15 ENCOUNTER — Telehealth: Payer: Self-pay | Admitting: Family Medicine

## 2020-08-15 NOTE — Telephone Encounter (Signed)
Informed of results.   Myra Rude, MD [facility] 08/15/2020, 8:50 AM

## 2020-08-15 NOTE — Assessment & Plan Note (Signed)
Thank you more associated with the joint as it does circumferentially the hip itself. Also having weakness on the thigh. -Counseled on home exercise therapy supportive care. -Rayos samples. -Consider hip injection versus physical therapy or further imaging.

## 2020-08-22 ENCOUNTER — Ambulatory Visit: Payer: Medicaid Other | Admitting: Internal Medicine

## 2020-08-31 MED FILL — PANTOPRAZOLE SOD DR 40 MG T: 40 | 30 days supply | Qty: 30 | Fill #1

## 2020-08-31 MED FILL — SYMBICORT 160-4.5 MCG INH: 160-4.5 | 30 days supply | Qty: 10 | Fill #9

## 2020-09-11 ENCOUNTER — Other Ambulatory Visit (INDEPENDENT_AMBULATORY_CARE_PROVIDER_SITE_OTHER): Payer: Self-pay | Admitting: Internal Medicine

## 2020-09-11 ENCOUNTER — Other Ambulatory Visit (INDEPENDENT_AMBULATORY_CARE_PROVIDER_SITE_OTHER): Payer: Self-pay | Admitting: Primary Care

## 2020-09-11 MED ORDER — FOLIC ACID 1 MG PO TABS: 1.0000 mg | ORAL_TABLET | Freq: Every day | ORAL | 0 refills | Status: DC

## 2020-09-11 MED FILL — FOLIC ACID 1 MG TABS: 1 | 30 days supply | Qty: 30 | Fill #0

## 2020-09-11 MED FILL — OXYBUTYNIN CL ER 10 MG TAB: 10 | 30 days supply | Qty: 30 | Fill #2

## 2020-09-11 MED FILL — hydrOXYzine HCL 50 MG TABS: 50 | 90 days supply | Qty: 270 | Fill #1

## 2020-09-11 NOTE — Telephone Encounter (Signed)
Medication Refill - Medication: folic acid (FOLVITE) 1 MG tablet   Has the patient contacted their pharmacy? Yes.  Contact PCP.   Preferred Pharmacy (with phone number or street name):   [facility] - [place], [place] - [address]  [address] [place] [address]  Phone: [phone] Fax: [phone]    Agent: Please be advised that RX refills may take up to 3 business days. We ask that you follow-up with your pharmacy.

## 2020-09-17 ENCOUNTER — Other Ambulatory Visit: Payer: Self-pay

## 2020-09-17 ENCOUNTER — Ambulatory Visit (INDEPENDENT_AMBULATORY_CARE_PROVIDER_SITE_OTHER): Payer: Medicaid Other | Admitting: Family Medicine

## 2020-09-17 VITALS — BP 182/84 | Ht 63.0 in | Wt 200.0 lb

## 2020-09-17 DIAGNOSIS — M25851 Other specified joint disorders, right hip: Secondary | ICD-10-CM

## 2020-09-17 DIAGNOSIS — M87051 Idiopathic aseptic necrosis of right femur: Secondary | ICD-10-CM | POA: Insufficient documentation

## 2020-09-17 DIAGNOSIS — M24159 Other articular cartilage disorders, unspecified hip: Secondary | ICD-10-CM | POA: Insufficient documentation

## 2020-09-17 NOTE — Progress Notes (Signed)
  [name] - 60 y.o. female MRN [id number]  Date of birth: [birth year]  SUBJECTIVE:  Including CC & ROS.  No chief complaint on file.   [name] is a 60 y.o. female that is following up for her right hip pain.  She has gotten improvement with the Rayos samples..  Independent review of lumbar spine x-ray from 1/11 shows degenerative facet changes lower lumbar spine.  Independent review of the right hip x-ray from 1/11 shows no acute changes.   Review of Systems See HPI   HISTORY: Past Medical, Surgical, Social, and Family History Reviewed & Updated per EMR.   Pertinent Historical Findings include:  Past Medical History:  Diagnosis Date  . COPD (chronic obstructive pulmonary disease) (HCC)   . Hypertension     Past Surgical History:  Procedure Laterality Date  . ABDOMINAL HYSTERECTOMY    . CARPAL TUNNEL RELEASE Bilateral    Per patient  . SHOULDER SURGERY     Per patient    Family History  Problem Relation Age of Onset  . Ulcers Father   . Hypotension Sister   . Hypertension Brother     Social History   Socioeconomic History  . Marital status: Single    Spouse name: Not on file  . Number of children: 0  . Years of education: 43  . Highest education level: GED or equivalent  Occupational History  . Occupation: Armed forces operational officer  Tobacco Use  . Smoking status: Current Every Day Smoker    Packs/day: 1.00    Years: 25.00    Pack years: 25.00    Types: Cigarettes    Start date: 08/04/1978  . Smokeless tobacco: Never Used  Vaping Use  . Vaping Use: Never used  Substance and Sexual Activity  . Alcohol use: Not Currently  . Drug use: Never  . Sexual activity: Never  Other Topics Concern  . Not on file  Social History Narrative   Lives and step-dad, they are able to help.   Social Determinants of Health   Financial Resource Strain: Not on file  Food Insecurity: Not on file  Transportation Needs: Not on file  Physical Activity: Not on file  Stress: Not  on file  Social Connections: Not on file  Intimate Partner Violence: Not on file     PHYSICAL EXAM:  VS: BP (!) 182/84 (BP Location: Right Arm, Patient Position: Sitting, Cuff Size: Large)   Ht 5\' 3"  (1.6 m)   Wt 200 lb (90.7 kg)   BMI 35.43 kg/m  Physical Exam Gen: NAD, alert, cooperative with exam, well-appearing     ASSESSMENT & PLAN:   Hip impingement syndrome, right Seem to have symptoms associated with the hip joint itself.  Has minor degenerative changes of the lumbar spine.  No significant degenerative changes of the hip joint.  Possible for synovial type irritation of the hip joint.  Does get improvement with the Rayos.  Has had improvement of her bilateral lower leg extremity swelling with redness. -Counseled on home exercise therapy and supportive care. -Referral to physical therapy. -Rayos samples. -Could consider hip joint injection.  Could consider repeat lab work.

## 2020-09-17 NOTE — Progress Notes (Signed)
Medication Samples have been provided to the patient.  Drug name: Rayos      Strength: 5mg      Qty: 21 LOT: B  Exp.Date: 02/01/2021  Dosing instructions: 1 tablet by mouth daily at 10 pm  The patient has been instructed regarding the correct time, dose, and frequency of taking this medication, including desired effects and most common side effects.   04/04/2021 3:14 PM 09/17/2020

## 2020-09-17 NOTE — Patient Instructions (Signed)
Good to see you Please try the rayos  Physical therapy will give you a call.   Please send me a message in MyChart with any questions or updates.  Please see me back in 4-6 weeks.   --Dr. Jordan Likes

## 2020-09-17 NOTE — Assessment & Plan Note (Signed)
Seem to have symptoms associated with the hip joint itself.  Has minor degenerative changes of the lumbar spine.  No significant degenerative changes of the hip joint.  Possible for synovial type irritation of the hip joint.  Does get improvement with the Rayos.  Has had improvement of her bilateral lower leg extremity swelling with redness. -Counseled on home exercise therapy and supportive care. -Referral to physical therapy. -Rayos samples. -Could consider hip joint injection.  Could consider repeat lab work.

## 2020-10-05 MED FILL — AMLODIPINE BESYLATE 10 MG T: 10 | 90 days supply | Qty: 90 | Fill #2

## 2020-10-05 MED FILL — SYMBICORT 160-4.5 MCG INH: 160-4.5 | 60 days supply | Qty: 20 | Fill #10

## 2020-10-05 MED FILL — PANTOPRAZOLE SOD DR 40 MG T: 40 | 60 days supply | Qty: 60 | Fill #2

## 2020-10-10 ENCOUNTER — Ambulatory Visit: Payer: Medicaid Other | Attending: Family Medicine | Admitting: Physical Therapy

## 2020-10-10 ENCOUNTER — Other Ambulatory Visit: Payer: Self-pay | Admitting: Internal Medicine

## 2020-10-10 ENCOUNTER — Encounter: Payer: Self-pay | Admitting: Physical Therapy

## 2020-10-10 ENCOUNTER — Other Ambulatory Visit: Payer: Self-pay

## 2020-10-10 DIAGNOSIS — M25551 Pain in right hip: Secondary | ICD-10-CM | POA: Insufficient documentation

## 2020-10-10 DIAGNOSIS — M25651 Stiffness of right hip, not elsewhere classified: Secondary | ICD-10-CM | POA: Insufficient documentation

## 2020-10-10 DIAGNOSIS — R2689 Other abnormalities of gait and mobility: Secondary | ICD-10-CM

## 2020-10-10 DIAGNOSIS — M62838 Other muscle spasm: Secondary | ICD-10-CM | POA: Diagnosis present

## 2020-10-10 MED FILL — FOLIC ACID 1 MG TABS: 1 | 30 days supply | Qty: 30 | Fill #1

## 2020-10-11 ENCOUNTER — Encounter: Payer: Self-pay | Admitting: Physical Therapy

## 2020-10-11 NOTE — Patient Instructions (Signed)
Access Code: [email or web address]: [email or web address]: 03/10/2022Prepared by: Alric Seton  Standing Glute Med Mobilization with [employer] on Wall - 3 x daily - 7 x weekly - 1-2 min hold  Supine Lower Trunk Rotation - 3 x daily - 7 x weekly - 1 sets - 10 reps  Supine Heel Slide with Strap - 3 x daily - 7 x weekly - 1 sets - 3 reps - 15 hold

## 2020-10-11 NOTE — Therapy (Signed)
[facility] [address] [place], [place], [address] Phone: [phone]   Fax:  [phone]  Physical Therapy Evaluation  Patient Details  Name: Lenah Messenger MRN: [id number] Date of Birth: Jan 17, 1961 Referring Provider (PT): Dr Thelma Comp   Encounter Date: 10/10/2020   PT End of Session - 10/10/20 1429    Visit Number 1    Number of Visits 12    Date for PT Re-Evaluation 11/21/20    Authorization Type MCD eval    PT Start Time 1415    PT Stop Time 1458    PT Time Calculation (min) 43 min    Activity Tolerance Patient tolerated treatment well    Behavior During Therapy [facility] for tasks assessed/performed           Past Medical History:  Diagnosis Date  . COPD (chronic obstructive pulmonary disease) (HCC)   . Hypertension     Past Surgical History:  Procedure Laterality Date  . ABDOMINAL HYSTERECTOMY    . CARPAL TUNNEL RELEASE Bilateral    Per patient  . SHOULDER SURGERY     Per patient    There were no vitals filed for this visit.    Subjective Assessment - 10/10/20 1422    Subjective Patient had an acute onet of right hip pain that started 3 months ago. She had an incidious onset of pain. The pain ids worese when she is getting up and when sitting in a chair. She feels lower chairs are worse for her hip. the pain can radiate into her lateral hip to anterior hip and down into her thigh. She also has circulation issues oin her feet.    Pertinent History smoker, ciculation probelms in her feet. back and neck pain    How long can you sit comfortably? can not ist in low chairs `    How long can you stand comfortably? increased pain with time    How long can you walk comfortably? walking feels better to point.    Diagnostic tests Mild degeneration fo the back and hips    Patient Stated Goals to have less pain    Currently in Pain? Yes    Pain Score 5     Pain Location Hip    Pain Orientation Right    Pain  Descriptors / Indicators Aching    Pain Type Chronic pain    Pain Radiating Towards into the anterior thigh    Pain Onset More than a month ago    Pain Frequency Constant    Aggravating Factors  prolonged activity and low chairs    Pain Relieving Factors heat; massager    Effect of Pain on Daily Activities difficulty perfroming daily activity              [facility] PT Assessment - 10/11/20 0001      Assessment   Medical Diagnosis Right Hip Pain    Referring Provider (PT) Dr Thelma Comp    Onset Date/Surgical Date --   3 months prior   Hand Dominance Right    Next MD Visit 11/16/2019    Prior Therapy none      Precautions   Precautions None      Restrictions   Weight Bearing Restrictions No      Balance Screen   Has the patient fallen in the past 6 months No    Has the patient had a decrease in activity level because of a fear of falling?  No  Is the patient reluctant to leave their home because of a fear of falling?  No      Home [employer]ist information centre manager residence    Additional Comments ramped entrance; hard to walk up steps      Prior Function   Level of Independence Independent    Vocation On disability    Leisure nothing      Cognition   Overall Cognitive Status Within Functional Limits for tasks assessed    Attention Focused    Focused Attention Appears intact    Memory Appears intact    Awareness Appears intact    Problem Solving Appears intact      Observation/Other Assessments   Focus on Therapeutic Outcomes (FOTO)  mediciad      Sensation   Light Touch Appears Intact      Coordination   Gross Motor Movements are Fluid and Coordinated No    Fine Motor Movements are Fluid and Coordinated No      AROM   Overall AROM Comments pain with active flexion and abdcution of the right hip      PROM   PROM Assessment Site Hip    Right/Left Hip Right    Right Hip Flexion 45    Right Hip Internal Rotation  --   significant pain      Strength   Overall Strength Comments left 4+/5    Right Hip Flexion 3+/5    Right Hip ABduction 3+/5    Right Hip ADduction 3+/5    Right Knee Flexion 3+/5    Right Knee Extension 3+/5      Palpation   Palpation comment spasming in upper gluteal extewnding all the way to the sacral border;      Bed Mobility   Bed Mobility --   pain lwith supine to sit and sit to supine     Transfers   Comments slow painful transfer frokm sit to stand      Ambulation/Gait   Gait Comments significant trendelenburgh on the right in stance phase. Lateral movement of the right hip in hip flexion                      Objective measurements completed on examination: See above findings.       [facility] Adult PT Treatment/Exercise - 10/11/20 0001      Knee/Hip Exercises: Stretches   Piriformis Stretch Limitations pain on the right 2x20 sec hold    Other Knee/Hip Stretches trigger point release to gluteal and QL with tennis ball    Other Knee/Hip Stretches LTR x20      Manual Therapy   Manual therapy comments attmepted LAD with grade II occilations but patient with limited tolerance. Trigger point release to gluteal.                  PT Education - 10/11/20 1230    Education Details reviewed HEP and symtom mangement; proper gait; effects of spasming    Person(s) Educated Patient    Methods Explanation;Demonstration;Tactile cues;Verbal cues    Comprehension Returned demonstration;Verbal cues required;Tactile cues required;Verbalized understanding            PT Short Term Goals - 10/11/20 1246      PT SHORT TERM GOAL #1   Title Patient will increase passive right hip flexion to 70 degrees    Baseline 45 degrees with apin and gaurding    Time 3    Period Weeks  Status New    Target Date 11/01/20      PT SHORT TERM GOAL #2   Title Patient will be indepdent with basic HEP    Baseline does not currently have an HEP    Time 3    Period Weeks    Status New    Target  Date 11/01/20      PT SHORT TERM GOAL #3   Title Patient will increase gross right LE strength to 4/5    Baseline right hip flexion and abdcution 4/5    Time 3    Period Weeks    Status New    Target Date 11/01/20             PT Long Term Goals - 10/11/20 1250      PT LONG TERM GOAL #1   Title Patient will stand for 1 hour without pain in order to perfrom daily tasks    Baseline 5-10 minutes    Time 6    Period Weeks    Status New    Target Date 11/22/20      PT LONG TERM GOAL #2   Title Patient will sit for 1 hour without increased pain    Baseline pain with increased sitting >5 min    Time 6    Period Weeks    Status New    Target Date 11/22/20                  Plan - 10/11/20 1233    Clinical Impression Statement Patient is a 60 year old female with right lateral hip pain with pain radiating into the anterior hip. She has significant spasming in her right gluteal, right IT band, and right QL. She walks with a signifcant trendelenburgh. She has limited active and passive hip flexion at this time which may be gaurding. She has weakness in the right hip. Signs and symptoms are consitent with diagnosis of hip impingement. She would benefit from skilled therapy to improve ability to ambualnce and improve strength in the right LE.    Personal Factors and Comorbidities Comorbidity 1;Comorbidity 2    Comorbidities COPD, smoker,    Examination-Activity Limitations Squat;Stairs;Stand;Locomotion Level;Transfers;Bend    Examination-Participation Restrictions Shop;Community Activity;Cleaning    Stability/Clinical Decision Making Evolving/Moderate complexity    Clinical Decision Making Moderate    Rehab Potential Good    PT Frequency 2x / week    PT Duration 6 weeks    PT Treatment/Interventions ADLs/Self Care Home Management;Electrical Stimulation;Cryotherapy;Iontophoresis 4mg /ml Dexamethasone;Moist Heat;Ultrasound;Gait training;Stair training;Functional mobility  training;Therapeutic activities;Therapeutic exercise;Neuromuscular re-education;Patient/family education;Manual techniques;Passive range of motion;Taping;Dry needling    PT Next Visit Plan soft tissue mobilization to the hip; work on gait. May benefit from standing activity; review stretches; quad ests, heel slides,    PT Home Exercise Plan Access Code: [email or web address]  URL: [email or web address]  Date: 10/11/2020  Prepared by: 12/11/2020    Exercises  .Standing Glute Med Mobilization with Small Ball on Wall - 3 x daily - 7 x weekly - 1-2 min hold  .Supine Lower Trunk Rotation - 3 x daily - 7 x weekly - 1 sets - 10 reps  .Supine Heel Slide with Strap - 3 x daily - 7 x weekly - 1 sets - 3 reps - 15 hold    Consulted and Agree with Plan of Care Patient           Patient will benefit from skilled therapeutic intervention in order to improve the following deficits and  impairments:  Abnormal gait,Increased fascial restricitons,Pain,Decreased activity tolerance,Decreased strength,Increased muscle spasms,Decreased endurance,Difficulty walking,Decreased range of motion  Visit Diagnosis: Pain in right hip  Other abnormalities of gait and mobility  Other muscle spasm  Stiffness of right hip, not elsewhere classified     Problem List Patient Active Problem List   Diagnosis Date Noted  . Hip impingement syndrome, right 09/17/2020  . Greater trochanteric pain syndrome of left lower extremity 07/24/2020  . Rheumatoid factor positive 06/26/2020  . CRP elevated 06/26/2020  . Bilateral hand pain 06/26/2020  . Stasis dermatitis of both legs 06/26/2020  . MDD (major depressive disorder), recurrent, in partial remission (HCC) 04/30/2020  . Alcohol use disorder, severe, in sustained remission (HCC) 04/30/2020  . Anxiety 04/30/2020  . Chronic sinusitis 04/10/2020  . Liver cirrhosis (HCC) 03/08/2020  . Major depressive disorder, recurrent episode, moderate with anxious distress (HCC)  02/11/2020  . Alcohol abuse, in remission 12/16/2019  . Nicotine dependence, cigarettes, uncomplicated 12/16/2019  . Generalized anxiety disorder 12/16/2019    Dessie Comaavid J Riddhi Grether PT DPT  10/11/2020, 1:02 PM  [facility] [address] [place], [place], [address] Phone: [phone]   Fax:  [phone]  Name: Birdena Crandallancy Haven MRN: [id number] Date of Birth: 11/14/1960

## 2020-10-15 ENCOUNTER — Other Ambulatory Visit: Payer: Self-pay

## 2020-10-15 ENCOUNTER — Ambulatory Visit (INDEPENDENT_AMBULATORY_CARE_PROVIDER_SITE_OTHER): Payer: Medicaid Other | Admitting: Family Medicine

## 2020-10-15 DIAGNOSIS — M24159 Other articular cartilage disorders, unspecified hip: Secondary | ICD-10-CM

## 2020-10-15 NOTE — Progress Notes (Signed)
  [name] - 60 y.o. female MRN [id number]  Date of birth: December 02, 1960  SUBJECTIVE:  Including CC & ROS.  No chief complaint on file.   [name] is a 59 y.o. female that is presenting with acute worsening of the right hip pain.  She has tried medications and therapy.  Pain is still ongoing and worse with certain positions in bed and walking.   Review of Systems See HPI   HISTORY: Past Medical, Surgical, Social, and Family History Reviewed & Updated per EMR.   Pertinent Historical Findings include:  Past Medical History:  Diagnosis Date  . COPD (chronic obstructive pulmonary disease) (HCC)   . Hypertension     Past Surgical History:  Procedure Laterality Date  . ABDOMINAL HYSTERECTOMY    . CARPAL TUNNEL RELEASE Bilateral    Per patient  . SHOULDER SURGERY     Per patient    Family History  Problem Relation Age of Onset  . Ulcers Father   . Hypotension Sister   . Hypertension Brother     Social History   Socioeconomic History  . Marital status: Single    Spouse name: Not on file  . Number of children: 0  . Years of education: 51  . Highest education level: GED or equivalent  Occupational History  . Occupation: Armed forces operational officer  Tobacco Use  . Smoking status: Current Every Day Smoker    Packs/day: 1.00    Years: 25.00    Pack years: 25.00    Types: Cigarettes    Start date: 08/04/1978  . Smokeless tobacco: Never Used  Vaping Use  . Vaping Use: Never used  Substance and Sexual Activity  . Alcohol use: Not Currently  . Drug use: Never  . Sexual activity: Never  Other Topics Concern  . Not on file  Social History Narrative   Lives and step-dad, they are able to help.   Social Determinants of Health   Financial Resource Strain: Not on file  Food Insecurity: Not on file  Transportation Needs: Not on file  Physical Activity: Not on file  Stress: Not on file  Social Connections: Not on file  Intimate Partner Violence: Not on file     PHYSICAL  EXAM:  VS: BP (!) 158/82   Ht 5\' 2"  (1.575 m)   Wt 200 lb (90.7 kg)   BMI 36.58 kg/m  Physical Exam Gen: NAD, alert, cooperative with exam, well-appearing MSK:  Right hip: Limited range of motion. Pain with resistance to hip flexion. Instability 1 leg standing. Negative straight leg raise. Neurovascular intact     ASSESSMENT & PLAN:   Labral tear of hip, degenerative Still having ongoing pain.  Concerned that she may have more had a particular issue with the posterior pain anterior pain.  Has pain with hip flexion -Counseled on exercise therapy and supportive care -Provided samples of Rayos - MRI to evaluate for labral tear vs hip pathology

## 2020-10-15 NOTE — Assessment & Plan Note (Addendum)
Still having ongoing pain.  Concerned that she may have more had a particular issue with the posterior pain anterior pain.  Has pain with hip flexion -Counseled on exercise therapy and supportive care -Provided samples of Rayos - MRI to evaluate for labral tear vs hip pathology

## 2020-10-15 NOTE — Patient Instructions (Signed)
Good to see you  Please use the rayos as close to 10 pm as you can  Please call [phone] to schedule the MRI  Please send me a message in MyChart with any questions or updates.  We will set up a virtual visit once the MRI is resulted.   --Dr. Jordan Likes

## 2020-10-15 NOTE — Progress Notes (Signed)
Medication Samples have been provided to the patient.  Drug name:   RAYOS     Strength:  5MG        Qty: 2 BOXES  LOT: B  Exp.Date: 7/22  Dosing instructions: take one at 10pm every night  The patient has been instructed regarding the correct time, dose, and frequency of taking this medication, including desired effects and most common side effects.   8/22 2:14 PM 10/15/2020

## 2020-10-17 ENCOUNTER — Other Ambulatory Visit: Payer: Self-pay

## 2020-10-17 ENCOUNTER — Ambulatory Visit: Payer: Medicaid Other

## 2020-10-17 DIAGNOSIS — M25551 Pain in right hip: Secondary | ICD-10-CM

## 2020-10-17 DIAGNOSIS — M25651 Stiffness of right hip, not elsewhere classified: Secondary | ICD-10-CM

## 2020-10-17 DIAGNOSIS — R2689 Other abnormalities of gait and mobility: Secondary | ICD-10-CM

## 2020-10-17 DIAGNOSIS — M62838 Other muscle spasm: Secondary | ICD-10-CM

## 2020-10-17 NOTE — Therapy (Signed)
[facility] Outpatient Rehabilitation [facility] [address] [place], [place], [address] Phone: [phone]   Fax:  [phone]  Physical Therapy Treatment  Patient Details  Name: [name] MRN: [id number] Date of Birth: 01/03/1961 Referring Provider (PT): Dr Thelma Comp   Encounter Date: 10/17/2020   PT End of Session - 10/17/20 1504    Visit Number 2    Number of Visits 12    Date for PT Re-Evaluation 11/21/20    Authorization Type MCD    Authorization Time Period 3/16-11/06/20    Authorization - Visit Number 1    Authorization - Number of Visits 3    PT Start Time 1500    PT Stop Time 1542    PT Time Calculation (min) 42 min    Activity Tolerance Patient tolerated treatment well    Behavior During Therapy [facility] for tasks assessed/performed           Past Medical History:  Diagnosis Date  . COPD (chronic obstructive pulmonary disease) (HCC)   . Hypertension     Past Surgical History:  Procedure Laterality Date  . ABDOMINAL HYSTERECTOMY    . CARPAL TUNNEL RELEASE Bilateral    Per patient  . SHOULDER SURGERY     Per patient    There were no vitals filed for this visit.   Subjective Assessment - 10/17/20 1502    Subjective Patient reports she had f/u with MD and an MRI has been scheduled for 11/03/20. She is working on her exercises.    Pertinent History smoker, ciculation probelms in her feet. back and neck pain    How long can you sit comfortably? can not ist in low chairs `    How long can you stand comfortably? increased pain with time    How long can you walk comfortably? walking feels better to point.    Diagnostic tests Mild degeneration fo the back and hips    Patient Stated Goals to have less pain    Currently in Pain? No/denies    Pain Onset More than a month ago                             [facility] Adult PT Treatment/Exercise - 10/17/20 0001      Knee/Hip Exercises: Stretches   Piriformis Stretch Limitations 2 x  30 sec    Other Knee/Hip Stretches heel slide Rt 1 x 10    Other Knee/Hip Stretches LTR 1 min      Knee/Hip Exercises: Supine   Bridges 2 sets;10 reps    Straight Leg Raises 2 sets;10 reps    Straight Leg Raises Limitations PPT    Other Supine Knee/Hip Exercises resisted hip abduction green band 2 x 15      Knee/Hip Exercises: Sidelying   Hip ABduction 2 sets;10 reps      Manual Therapy   Manual therapy comments LAD Rt hip, inferior and posterior hip mobilization grade II-III, STM/DTM Rt glutes                    PT Short Term Goals - 10/11/20 1246      PT SHORT TERM GOAL #1   Title Patient will increase passive right hip flexion to 70 degrees    Baseline 45 degrees with apin and gaurding    Time 3    Period Weeks    Status New    Target Date 11/01/20  PT SHORT TERM GOAL #2   Title Patient will be indepdent with basic HEP    Baseline does not currently have an HEP    Time 3    Period Weeks    Status New    Target Date 11/01/20      PT SHORT TERM GOAL #3   Title Patient will increase gross right LE strength to 4/5    Baseline right hip flexion and abdcution 4/5    Time 3    Period Weeks    Status New    Target Date 11/01/20             PT Long Term Goals - 10/11/20 1250      PT LONG TERM GOAL #1   Title Patient will stand for 1 hour without pain in order to perfrom daily tasks    Baseline 5-10 minutes    Time 6    Period Weeks    Status New    Target Date 11/22/20      PT LONG TERM GOAL #2   Title Patient will sit for 1 hour without increased pain    Baseline pain with increased sitting >5 min    Time 6    Period Weeks    Status New    Target Date 11/22/20                 Plan - 10/17/20 1505    Clinical Impression Statement Overall good tolerance to today's session with patient reporting minor discomfort about Rt hip during stretching, though no pain reported with strengthening exercises. Notable tautness and palpable  tenderness about Rt glute med/max with partial release from manual therapy. Patient has difficulty maintaining pelvic stability with hip abductor strengthening and required heavy cues to maintain neutral spine during supine activity as she demonstrates excessive anterior tilt. Cues required for pacing of all exercises.    Personal Factors and Comorbidities Comorbidity 1;Comorbidity 2    Comorbidities COPD, smoker,    Examination-Activity Limitations Squat;Stairs;Stand;Locomotion Level;Transfers;Bend    Examination-Participation Restrictions Shop;Community Activity;Cleaning    Stability/Clinical Decision Making Evolving/Moderate complexity    Rehab Potential Good    PT Frequency 2x / week    PT Duration 6 weeks    PT Treatment/Interventions ADLs/Self Care Home Management;Electrical Stimulation;Cryotherapy;Iontophoresis 4mg /ml Dexamethasone;Moist Heat;Ultrasound;Gait training;Stair training;Functional mobility training;Therapeutic activities;Therapeutic exercise;Neuromuscular re-education;Patient/family education;Manual techniques;Passive range of motion;Taping;Dry needling    PT Next Visit Plan soft tissue mobilization to the hip; work on gait. update HEP. transfers, core and hip strengthening.    PT Home Exercise Plan Access Code: [email or web address]  URL: [email or web address]  Date: 10/11/2020  Prepared by: 12/11/2020    Exercises  .Standing Glute Med Mobilization with Small Ball on Wall - 3 x daily - 7 x weekly - 1-2 min hold  .Supine Lower Trunk Rotation - 3 x daily - 7 x weekly - 1 sets - 10 reps  .Supine Heel Slide with Strap - 3 x daily - 7 x weekly - 1 sets - 3 reps - 15 hold    Consulted and Agree with Plan of Care Patient           Patient will benefit from skilled therapeutic intervention in order to improve the following deficits and impairments:  Abnormal gait,Increased fascial restricitons,Pain,Decreased activity tolerance,Decreased strength,Increased muscle spasms,Decreased  endurance,Difficulty walking,Decreased range of motion  Visit Diagnosis: Pain in right hip  Other abnormalities of gait and mobility  Other muscle spasm  Stiffness of right hip, not elsewhere classified  Problem List Patient Active Problem List   Diagnosis Date Noted  . Labral tear of hip, degenerative 09/17/2020  . Greater trochanteric pain syndrome of left lower extremity 07/24/2020  . Rheumatoid factor positive 06/26/2020  . CRP elevated 06/26/2020  . Bilateral hand pain 06/26/2020  . Stasis dermatitis of both legs 06/26/2020  . MDD (major depressive disorder), recurrent, in partial remission (HCC) 04/30/2020  . Alcohol use disorder, severe, in sustained remission (HCC) 04/30/2020  . Anxiety 04/30/2020  . Chronic sinusitis 04/10/2020  . Liver cirrhosis (HCC) 03/08/2020  . Major depressive disorder, recurrent episode, moderate with anxious distress (HCC) 02/11/2020  . Alcohol abuse, in remission 12/16/2019  . Nicotine dependence, cigarettes, uncomplicated 12/16/2019  . Generalized anxiety disorder 12/16/2019   Letitia Libra, PT, DPT, ATC 10/17/20 3:43 PM  [facility] Outpatient Rehabilitation [facility] [address] [place], [place], [address] Phone: [phone]   Fax:  [phone]  Name: [name] MRN: [id number] Date of Birth: August 09, 1960

## 2020-10-23 ENCOUNTER — Other Ambulatory Visit: Payer: Self-pay | Admitting: Otolaryngology

## 2020-10-23 MED FILL — AMOX-CLAV 875-125 MG TABLET: 875-125 | 10 days supply | Qty: 20 | Fill #0

## 2020-10-24 ENCOUNTER — Other Ambulatory Visit: Payer: Self-pay

## 2020-10-24 ENCOUNTER — Ambulatory Visit: Payer: Medicaid Other

## 2020-10-24 DIAGNOSIS — R2689 Other abnormalities of gait and mobility: Secondary | ICD-10-CM

## 2020-10-24 DIAGNOSIS — M62838 Other muscle spasm: Secondary | ICD-10-CM

## 2020-10-24 DIAGNOSIS — M25651 Stiffness of right hip, not elsewhere classified: Secondary | ICD-10-CM

## 2020-10-24 DIAGNOSIS — M25551 Pain in right hip: Secondary | ICD-10-CM

## 2020-10-24 MED FILL — OXYBUTYNIN CL ER 10 MG TAB: 10 | 30 days supply | Qty: 30 | Fill #0

## 2020-10-24 MED FILL — FUROSEMIDE 20 MG TABS: 20 | 90 days supply | Qty: 90 | Fill #1

## 2020-10-24 NOTE — Patient Instructions (Signed)

## 2020-10-24 NOTE — Therapy (Addendum)
[place], [place], [address] Phone: [phone]   Fax:  [phone]  Physical Therapy Treatment/Discharge   Patient Details  Name: [name] MRN: [id number] Date of Birth: [birth year] Referring Provider (PT): Dr Renaye Rakers   Encounter Date: 10/24/2020   PT End of Session - 10/24/20 1144    Visit Number 3    Number of Visits 12    Date for PT Re-Evaluation 11/21/20    Authorization Type MCD    Authorization Time Period 3/16-11/06/20    Authorization - Visit Number 2    Authorization - Number of Visits 3    PT Start Time 7680    PT Stop Time 1229    PT Time Calculation (min) 44 min    Activity Tolerance Patient tolerated treatment well    Behavior During Therapy [facility] for tasks assessed/performed           Past Medical History:  Diagnosis Date  . COPD (chronic obstructive pulmonary disease) ([place])   . Hypertension     Past Surgical History:  Procedure Laterality Date  . ABDOMINAL HYSTERECTOMY    . CARPAL TUNNEL RELEASE Bilateral    Per patient  . SHOULDER SURGERY     Per patient    There were no vitals filed for this visit.   Subjective Assessment - 10/24/20 1146    Subjective "I bent over Saturday and felt a pop in my hip and it's been sore ever since." She reports the pop was painful. She continues to complete exercises.    Pertinent History smoker, ciculation probelms in her feet. back and neck pain    How long can you sit comfortably? can not ist in low chairs `    How long can you stand comfortably? increased pain with time    How long can you walk comfortably? walking feels better to point.    Diagnostic tests Mild degeneration fo the back and hips    Patient Stated Goals to have less pain    Currently in Pain? Yes    Pain Score 4     Pain Location Hip    Pain Orientation Right    Pain Descriptors / Indicators Sore    Pain Onset More than a month ago              [facility]  PT Assessment - 10/24/20 0001      PROM   Right Hip Flexion 60      Special Tests   Other special tests (+) FADIR (+) Hip Scour                         [facility] Adult PT Treatment/Exercise - 10/24/20 0001      Self-Care   Self-Care Other Self-Care Comments    Other Self-Care Comments  see patient education      Knee/Hip Exercises: Stretches   Passive Hamstring Stretch 60 seconds    Passive Hamstring Stretch Limitations with strap RLE    Quad Stretch 60 seconds    Quad Stretch Limitations with strap RLE    Other Knee/Hip Stretches heel slide Rt 1 x 10    Other Knee/Hip Stretches adductor stretch 60 secs      Knee/Hip Exercises: Supine   Bridges 2 sets;10 reps    Straight Leg Raises 2 sets;10 reps    Straight Leg Raises Limitations PPT    Other Supine Knee/Hip Exercises resisted hip abduction green band 2  x 15      Manual Therapy   Manual therapy comments Rt hip LAD, inferior and posterior hip joint mobs grade II-IV                  PT Education - 10/24/20 1213    Education Details reviewed and updated HEP. Education on proper completion of supine to sit transfer (bed mobility) with handout provided on posture/body mechanics.    Person(s) Educated Patient    Methods Explanation;Verbal cues;Handout;Demonstration    Comprehension Verbalized understanding;Verbal cues required;Need further instruction;Returned demonstration            PT Short Term Goals - 10/24/20 1205      PT SHORT TERM GOAL #1   Title Patient will increase passive right hip flexion to 70 degrees    Baseline 60 degrees    Time 3    Period Weeks    Status On-going    Target Date 11/01/20      PT SHORT TERM GOAL #2   Title Patient will be indepdent with basic HEP    Baseline independent with initial HEP.    Time 3    Period Weeks    Status Achieved    Target Date 11/01/20      PT SHORT TERM GOAL #3   Title Patient will increase gross right LE strength to 4/5    Baseline  right hip flexion and abdcution 4/5    Time 3    Period Weeks    Status Unable to assess    Target Date 11/01/20             PT Long Term Goals - 10/11/20 1250      PT LONG TERM GOAL #1   Title Patient will stand for 1 hour without pain in order to perfrom daily tasks    Baseline 5-10 minutes    Time 6    Period Weeks    Status New    Target Date 11/22/20      PT LONG TERM GOAL #2   Title Patient will sit for 1 hour without increased pain    Baseline pain with increased sitting >5 min    Time 6    Period Weeks    Status New    Target Date 11/22/20                 Plan - 10/24/20 1158    Clinical Impression Statement Patient responds well to inferior hip joint mobilizaitons and LAD reporting a reduction in hip pain while manual therapy performed. Patient has positive FADIR and hip scour suggestive of potential labral pathology, though is making gradual progress in her hip ROM compared to baseline measures. Updated HEP to include hip strengthening as patient demonstrates independence with initial mobility exercises. Slight improvement in ability to maintain neutral spine during strengthening exercises, though continues to require intermittent cues to decrease excessive anterior pelvic tilt especially with SLR. Time spent educating patient on proper sequence of supine to sit transfer as she initially demonstrates poor body mechanics when she attempts this transfer. Able to properly perform log roll transition and reports this causes less pulling in her hip and low back when she performs transfer correctly. Overall good tolerance to today's session reporting a reduction in hip pain at end of session rated as 1/10.    Personal Factors and Comorbidities Comorbidity 1;Comorbidity 2    Comorbidities COPD, smoker,    Examination-Activity Limitations Squat;Stairs;Stand;Locomotion Level;Transfers;Bend    Examination-Participation  Restrictions Shop;Community Activity;Cleaning     Stability/Clinical Decision Making Evolving/Moderate complexity    Rehab Potential Good    PT Frequency 2x / week    PT Duration 6 weeks    PT Treatment/Interventions ADLs/Self Care Home Management;Electrical Stimulation;Cryotherapy;Iontophoresis 19m/ml Dexamethasone;Moist Heat;Ultrasound;Gait training;Stair training;Functional mobility training;Therapeutic activities;Therapeutic exercise;Neuromuscular re-education;Patient/family education;Manual techniques;Passive range of motion;Taping;Dry needling    PT Next Visit Plan soft tissue mobilization to the hip; work on gait. update HEP. transfers, core and hip strengthening.    PT Home Exercise Plan Access Code: [email or web address]  URL: [email or web address]  Date: 10/11/2020  Prepared by: DCarolyne Littles   Exercises  .Standing Glute Med Mobilization with Small Ball on Wall - 3 x daily - 7 x weekly - 1-2 min hold  .Supine Lower Trunk Rotation - 3 x daily - 7 x weekly - 1 sets - 10 reps  .Supine Heel Slide with Strap - 3 x daily - 7 x weekly - 1 sets - 3 reps - 15 hold    Consulted and Agree with Plan of Care Patient           Patient will benefit from skilled therapeutic intervention in order to improve the following deficits and impairments:  Abnormal gait,Increased fascial restricitons,Pain,Decreased activity tolerance,Decreased strength,Increased muscle spasms,Decreased endurance,Difficulty walking,Decreased range of motion  Visit Diagnosis: Pain in right hip  Other abnormalities of gait and mobility  Other muscle spasm  Stiffness of right hip, not elsewhere classified     Problem List Patient Active Problem List   Diagnosis Date Noted  . Labral tear of hip, degenerative 09/17/2020  . Greater trochanteric pain syndrome of left lower extremity 07/24/2020  . Rheumatoid factor positive 06/26/2020  . CRP elevated 06/26/2020  . Bilateral hand pain 06/26/2020  . Stasis dermatitis of both legs 06/26/2020  . MDD (major depressive  disorder), recurrent, in partial remission ([place] 04/30/2020  . Alcohol use disorder, severe, in sustained remission ([place] 04/30/2020  . Anxiety 04/30/2020  . Chronic sinusitis 04/10/2020  . Liver cirrhosis ([place] 03/08/2020  . Major depressive disorder, recurrent episode, moderate with anxious distress ([place] 02/11/2020  . Alcohol abuse, in remission 12/16/2019  . Nicotine dependence, cigarettes, uncomplicated [id number] . Generalized anxiety disorder 12/16/2019   SGwendolyn Grant PT, DPT, ATC 10/24/20 1:20 PM PHYSICAL THERAPY DISCHARGE SUMMARY  Visits from Start of Care: 3  Current functional level related to goals / functional outcomes: See above.    Remaining deficits: See above   Education / Equipment: See above   Plan: Patient agrees to discharge.  Patient goals were partially met. Patient is being discharged due to                                                     Patient called to request discharge as she is likely undergoing surgery for her hip. ?????         SGwendolyn Grant PT, DPT, ATC 11/07/20 5:38 PM  [facility] [place] [address]Phone: [phone]  Fax:  [phone] Name: Julie PetschMRN: [id number] of Birth: [birth year]

## 2020-10-31 ENCOUNTER — Other Ambulatory Visit (HOSPITAL_COMMUNITY): Payer: Self-pay | Admitting: Psychiatry

## 2020-10-31 ENCOUNTER — Ambulatory Visit: Payer: Medicaid Other

## 2020-10-31 DIAGNOSIS — F3341 Major depressive disorder, recurrent, in partial remission: Secondary | ICD-10-CM

## 2020-11-03 ENCOUNTER — Other Ambulatory Visit: Payer: Self-pay

## 2020-11-03 ENCOUNTER — Ambulatory Visit
Admission: RE | Admit: 2020-11-03 | Discharge: 2020-11-03 | Disposition: A | Discharge status: Home / Self Care | Payer: Medicaid Other | Source: Ambulatory Visit | Attending: Family Medicine | Admitting: Family Medicine

## 2020-11-03 DIAGNOSIS — M24159 Other articular cartilage disorders, unspecified hip: Secondary | ICD-10-CM

## 2020-11-03 IMAGING — MR MR HIP*R* W/O CM
6 series · 35 of 40 positions shown · non-contrast
Comparison: Radiographs from [DATE]

CLINICAL DATA: Right hip and groin pain for 5 months.

EXAM:
MR OF THE RIGHT HIP WITHOUT CONTRAST
TECHNIQUE: Multiplanar, multisequence MR imaging was performed. No intravenous
contrast was administered.

[Series 5: T2 fat-sat · coronal · right · 3.0mm · 0.89mm/px · 8 of 52 slices shown (1 of 2)]
[im 1/52]
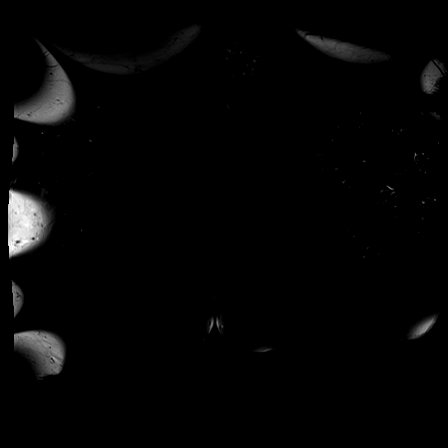
[im 8/52]
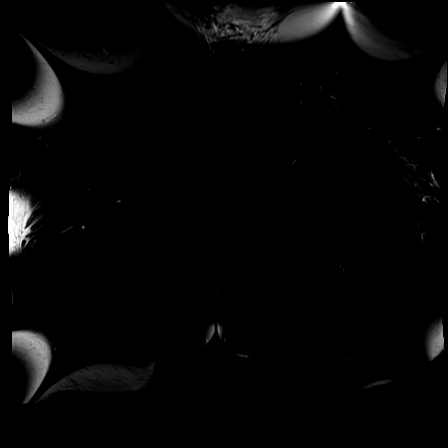
[im 15/52]
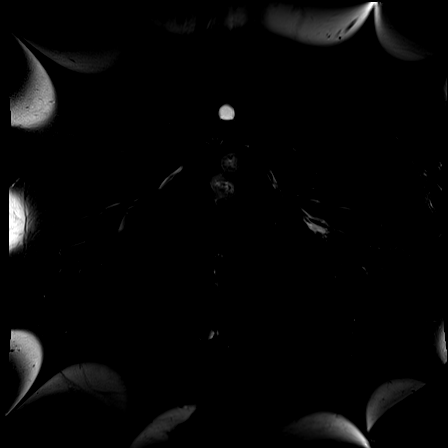
[im 22/52]
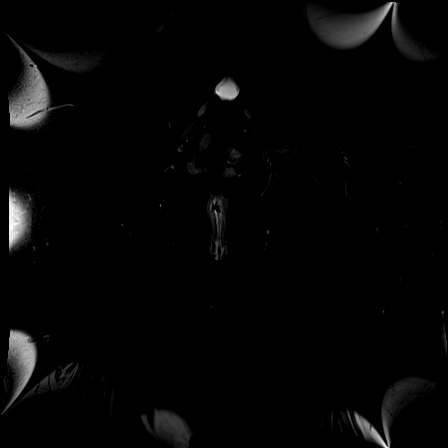
[im 30/52]
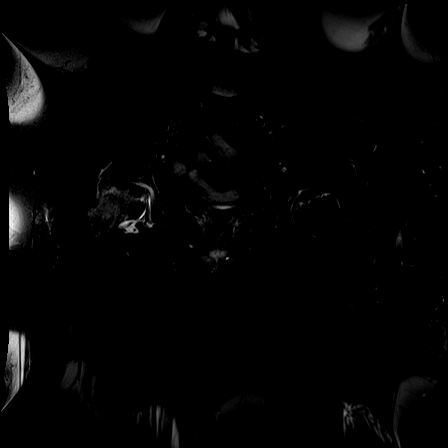
[im 37/52]
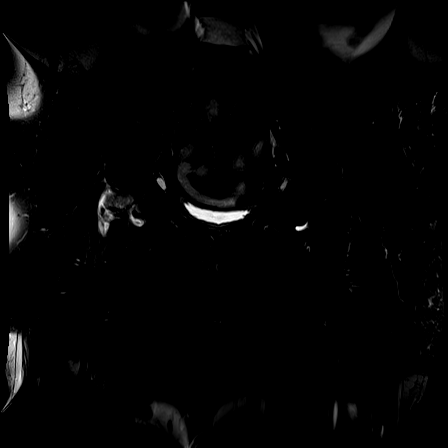
[im 44/52]
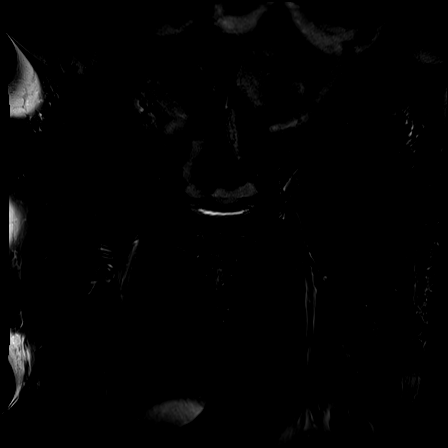
[im 52/52]
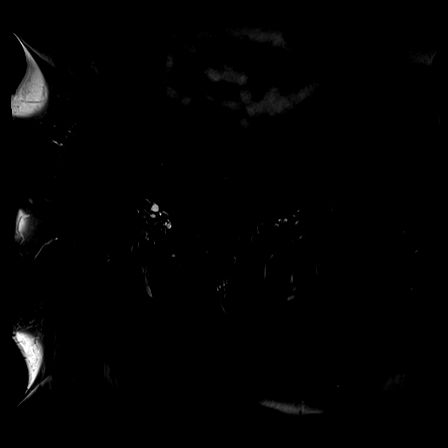

[Series 6: T1 · coronal · right · 3.0mm · 0.89mm/px · 3 of 50 slices shown]
[im 1/50]
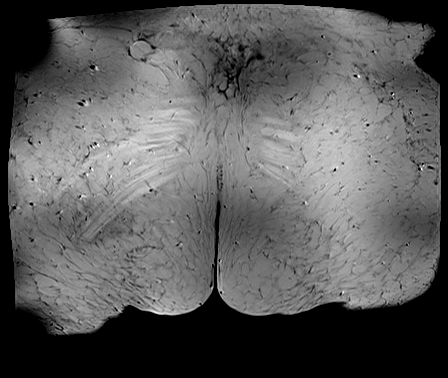
[im 9/50]
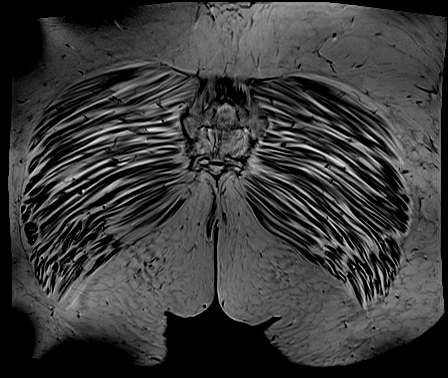
[im 17/50]
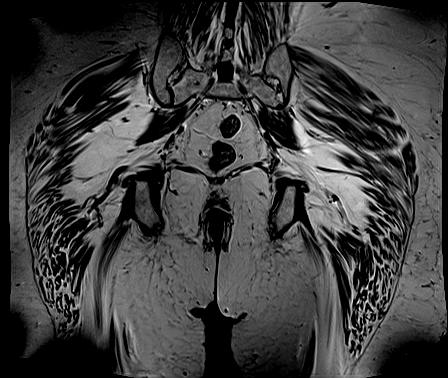

[Series 7: T2 fat-sat · axial · right · 3.0mm · 1.19mm/px · z∈[-6,+224]mm · 8 of 65 slices shown (2 of 2)]
[im 1/65]
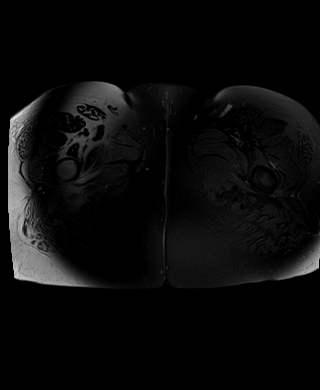
[im 9/65]
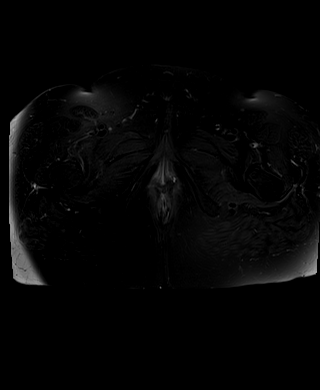
[im 17/65]
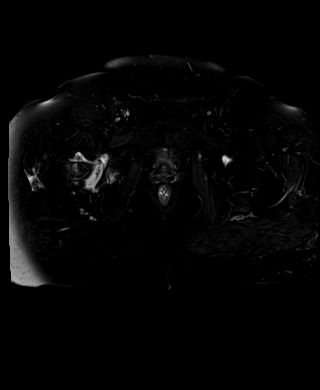
[im 25/65]
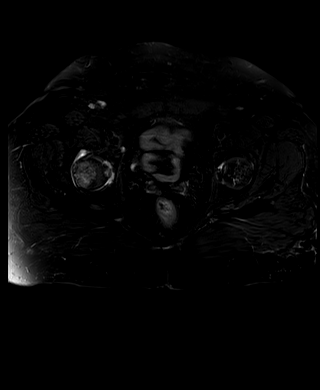
[im 41/65]
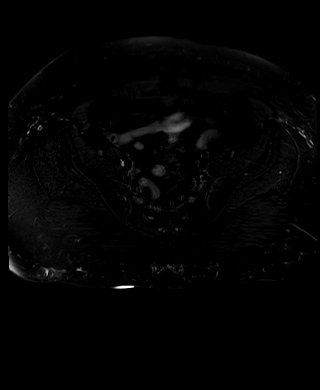
[im 49/65]
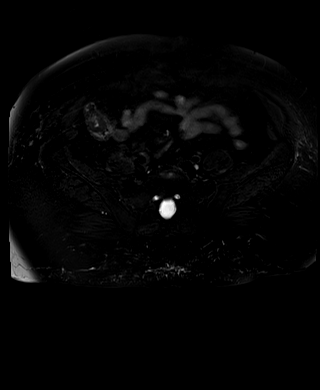
[im 57/65]
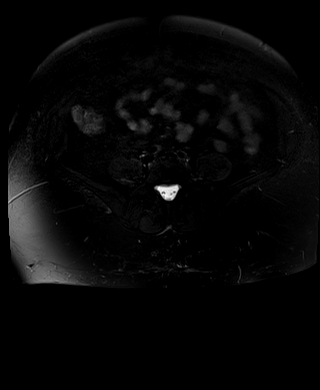
[im 65/65]
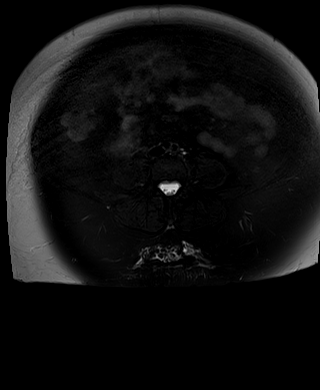

[Series 8: PD fat-sat · coronal · right · 3.0mm · 0.56mm/px · 5 of 36 slices shown (1 of 3)]
[im 1/36]
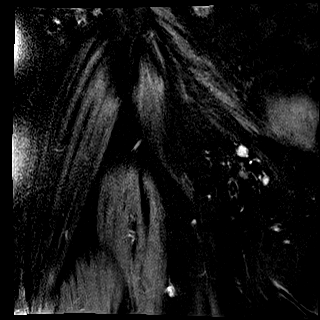
[im 9/36]
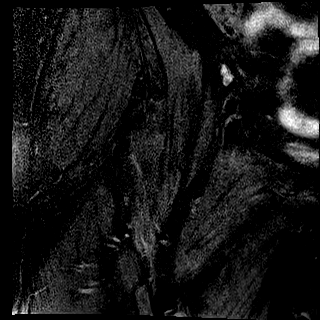
[im 18/36]
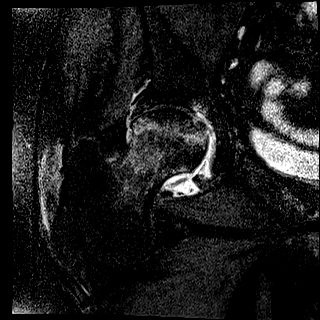
[im 27/36]
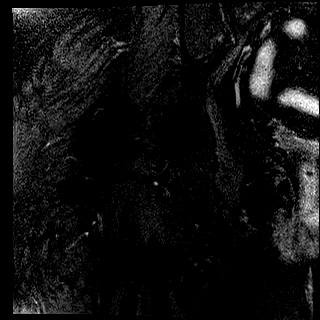
[im 36/36]
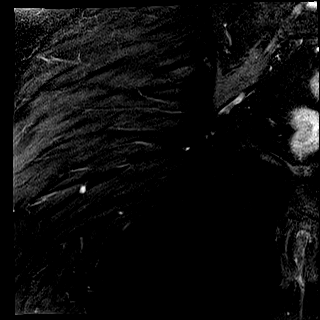

[Series 9: PD fat-sat · sagittal · right · 3.0mm · 0.56mm/px · 6 of 45 slices shown (2 of 3)]
[im 1/45]
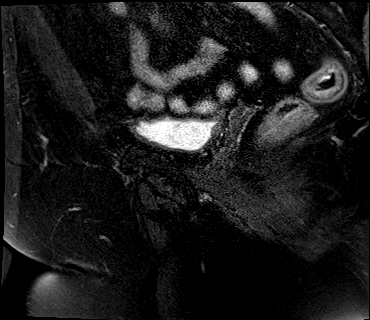
[im 9/45]
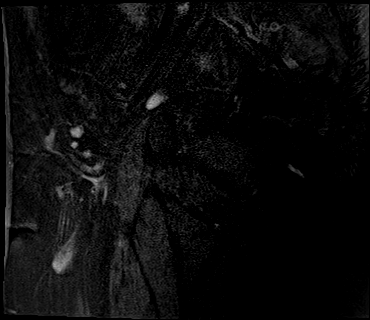
[im 18/45]
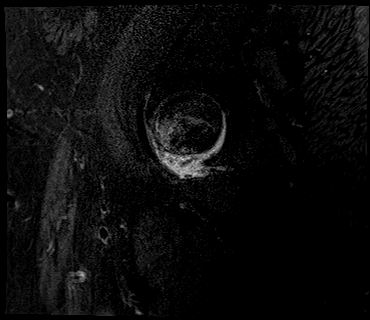
[im 27/45]
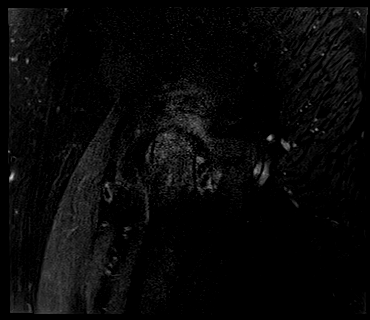
[im 36/45]
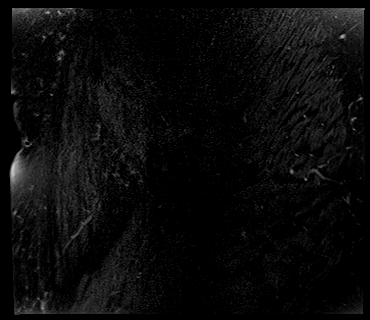
[im 45/45]
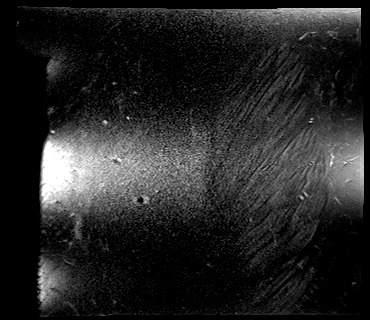

[Series 10: PD fat-sat · axial · right · 3.0mm · 0.56mm/px · z∈[-64,+39]mm · 5 of 36 slices shown (3 of 3)]
[im 1/36]
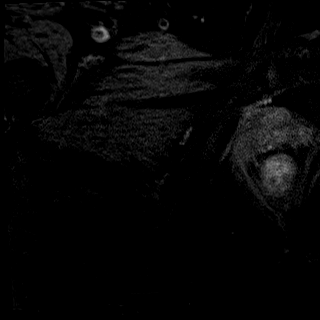
[im 9/36]
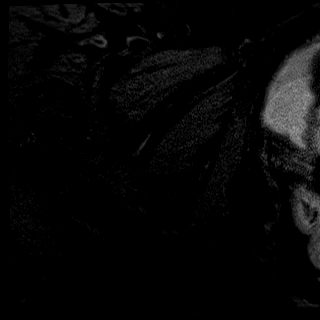
[im 18/36]
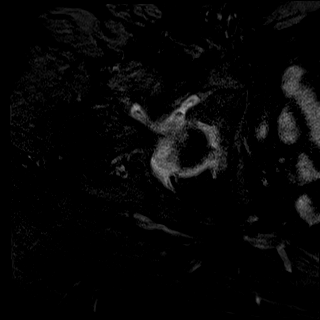
[im 27/36]
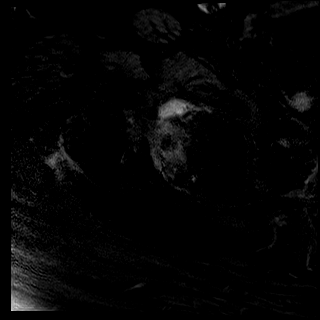
[im 36/36]
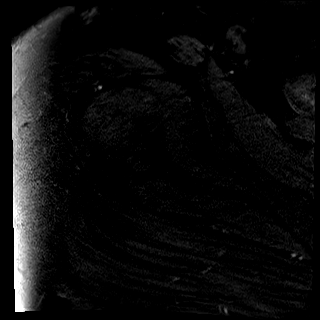

[35 of 40 positions shown; findings below may reference images not displayed]

FINDINGS: Bones: Bilateral femoral head avascular necrosis, with considerable
edema in the right femoral head and neck and a suggestion of early
collapse/contour change along the involved proximal region.
Substantial right hip joint effusion noted. Low-level reactive edema
in the adjacent acetabulum.

In the contralateral (left) hip, there is evidence of avascular
necrosis with chronic revascularization and no collapse, edema, or
acute feature.

Articular cartilage and labrum

Articular cartilage: Mild thinning of articular cartilage in both
hips.

Labrum: Linear accentuated signal in the anterior superior
acetabular labrum on images 19-20 of series 9 suspicious for a small
tear given the visibility on 2 consecutive images.

Joint or bursal effusion

Joint effusion:  Present on the right

Bursae: Mild right and trace left trochanteric bursitis.

Muscles and tendons

Muscles and tendons:  Unremarkable

Other findings

Miscellaneous:   Uterus absent.
IMPRESSION: 1. Bilateral femoral head avascular necrosis, chronic and quiescent
on the left, but active on the right side with extensive edema in
the right femoral head and neck, right hip joint effusion, and what
appears to be early collapse of the region of AVN causing a subtle
aspherical contour change.
2. Mild right and trace left trochanteric bursitis.
3. Mild degenerative chondral thinning in both hips.
4. Suspected small tear of the right anterior superior acetabular
labrum.

## 2020-11-05 ENCOUNTER — Ambulatory Visit (INDEPENDENT_AMBULATORY_CARE_PROVIDER_SITE_OTHER): Payer: Medicaid Other | Admitting: Psychiatry

## 2020-11-05 ENCOUNTER — Other Ambulatory Visit: Payer: Self-pay

## 2020-11-05 ENCOUNTER — Encounter (HOSPITAL_COMMUNITY): Payer: Self-pay | Admitting: Psychiatry

## 2020-11-05 DIAGNOSIS — F419 Anxiety disorder, unspecified: Secondary | ICD-10-CM

## 2020-11-05 DIAGNOSIS — F3341 Major depressive disorder, recurrent, in partial remission: Secondary | ICD-10-CM | POA: Diagnosis not present

## 2020-11-05 MED ORDER — SERTRALINE HCL 100 MG PO TABS
ORAL_TABLET | ORAL | 2 refills | Status: DC
Start: 2020-11-05 — End: 2021-01-03

## 2020-11-05 MED ORDER — HYDROXYZINE HCL 50 MG PO TABS: ORAL_TABLET | ORAL | 2 refills | Status: DC

## 2020-11-05 MED ORDER — QUETIAPINE FUMARATE 200 MG PO TABS: ORAL_TABLET | Freq: Every day | ORAL | 2 refills | Status: DC

## 2020-11-05 NOTE — Progress Notes (Signed)
BH MD/PA/NP OP Progress Note  11/05/2020 10:24 AM [name]  MRN:  [id number]  Chief Complaint: " I have been out of my medications for 3 to 4 days and need refills"  HPI: 60 year old female seen today for follow-up psychiatric evaluation.  She has a psychiatric history MDD, severe alcohol use disorder now in remission.  She is currently managed on Zoloft 100 mg daily, Seroquel 200 mg nightly, and hydroxyzine 50 mg 3 times daily as needed.  Today she notes her medications are effective in managing her psychiatric conditions.  During assessment patient was pleasant, cooperative, engaged in conversation, and maintained eye contact.  She notes that overall she is doing well however informed provider that she is anxious about finding an apartment of her own.  She notes that she stays with her mother and her father and is ready to be on her own.  Today provider conducted a GAD-7 and patient scored a 12.  Provider also conducted a PHQ-9 and patient scored a 5.  Patient notes that overall her medications are working and notes that she just needs refills on them and does not want adjustments made.  Today she endorses adequate sleep and appetite.  She denies SI/HI/VAH or paranoia.  No medication changes made.  Patient agreeable to continue medications as prescribed.  She will follow up with outpatient counseling for therapy.  No other concerns noted at this time. Visit Diagnosis:    ICD-10-CM   1. MDD (major depressive disorder), recurrent, in partial remission (HCC)  F33.41 sertraline (ZOLOFT) 100 MG tablet    QUEtiapine (SEROQUEL) 200 MG tablet  2. Anxiety  F41.9 hydrOXYzine (ATARAX/VISTARIL) 50 MG tablet    Past Psychiatric History: MDD, severe alcohol use disorder now in remission Past Medical History:  Past Medical History:  Diagnosis Date  . COPD (chronic obstructive pulmonary disease) (HCC)   . Hypertension     Past Surgical History:  Procedure Laterality Date  . ABDOMINAL HYSTERECTOMY     . CARPAL TUNNEL RELEASE Bilateral    Per patient  . SHOULDER SURGERY     Per patient    Family Psychiatric History:  denied  Family History:  Family History  Problem Relation Age of Onset  . Ulcers Father   . Hypotension Sister   . Hypertension Brother     Social History:  Social History   Socioeconomic History  . Marital status: Single    Spouse name: Not on file  . Number of children: 0  . Years of education: 9  . Highest education level: GED or equivalent  Occupational History  . Occupation: Armed forces operational officer  Tobacco Use  . Smoking status: Current Every Day Smoker    Packs/day: 1.00    Years: 25.00    Pack years: 25.00    Types: Cigarettes    Start date: 08/04/1978  . Smokeless tobacco: Never Used  Vaping Use  . Vaping Use: Never used  Substance and Sexual Activity  . Alcohol use: Not Currently  . Drug use: Never  . Sexual activity: Never  Other Topics Concern  . Not on file  Social History Narrative   Lives and step-dad, they are able to help.   Social Determinants of Health   Financial Resource Strain: Not on file  Food Insecurity: Not on file  Transportation Needs: Not on file  Physical Activity: Not on file  Stress: Not on file  Social Connections: Not on file    Allergies: No Known Allergies  Metabolic Disorder Labs: Lab  Results  Component Value Date   HGBA1C 5.1 11/04/2019   MPG 99.67 11/04/2019   No results found for: PROLACTIN No results found for: CHOL, TRIG, HDL, CHOLHDL, VLDL, LDLCALC Lab Results  Component Value Date   TSH 2.370 05/22/2020    Therapeutic Level Labs: No results found for: LITHIUM No results found for: VALPROATE No components found for:  CBMZ  Current Medications: Current Outpatient Medications  Medication Sig Dispense Refill  . Albuterol Sulfate 2.5 MG/0.5ML NEBU Inhale 2.5 mg into the lungs every 6 (six) hours as needed (sob and wheezing).    Marland Kitchen amLODipine (NORVASC) 10 MG tablet TAKE 1 TABLET BY MOUTH  DAILY. 90 tablet 2  . amoxicillin-clavulanate (AUGMENTIN) 875-125 MG tablet TAKE 1 TABLET BY MOUTH 2 TIMES DAILY FOR 10 DAYS. 20 tablet 0  . budesonide-formoterol (SYMBICORT) 160-4.5 MCG/ACT inhaler INHALE 2 PUFFS INTO THE LUNGS 2 (TWO) TIMES DAILY. 10.2 g 11  . folic acid (FOLVITE) 1 MG tablet TAKE 1 TABLET (1 MG TOTAL) BY MOUTH DAILY. 90 tablet 0  . furosemide (LASIX) 20 MG tablet TAKE 1 TABLET BY MOUTH DAILY 90 tablet 1  . hydrOXYzine (ATARAX/VISTARIL) 50 MG tablet TAKE 1 TABLET (50 MG TOTAL) BY MOUTH 3 (THREE) TIMES DAILY AS NEEDED FOR ANXIETY. 90 tablet 2  . oxybutynin (DITROPAN-XL) 10 MG 24 hr tablet TAKE 1 TABLET (10 MG TOTAL) BY MOUTH AT BEDTIME. 30 tablet 2  . pantoprazole (PROTONIX) 40 MG tablet TAKE 1 TABLET (40 MG TOTAL) BY MOUTH DAILY. 30 tablet 3  . predniSONE (DELTASONE) 5 MG tablet Take 6 pills for first day, 5 pills second day, 4 pills third day, 3 pills fourth day, 2 pills the fifth day, and 1 pill sixth day. 21 tablet 0  . QUEtiapine (SEROQUEL) 200 MG tablet TAKE 1 TABLET (200 MG TOTAL) BY MOUTH AT BEDTIME. 90 tablet 2  . sertraline (ZOLOFT) 100 MG tablet TAKE 1 TABLET (100 MG TOTAL) BY MOUTH DAILY WITH BREAKFAST. 30 tablet 2   No current facility-administered medications for this visit.     Musculoskeletal: Strength & Muscle Tone: within normal limits Gait & Station: normal Patient leans: N/A  Psychiatric Specialty Exam: Review of Systems  Blood pressure (!) 179/88, pulse (!) 102, temperature 98 F (36.7 C), temperature source Oral, height 5\' 2"  (1.575 m), weight 211 lb 9.6 oz (96 kg), SpO2 99 %.Body mass index is 38.7 kg/m.  General Appearance: Well Groomed  Eye Contact:  Good  Speech:  Clear and Coherent and Normal Rate  Volume:  Normal  Mood:  Euthymic and Notes that she has occasional anxiety and depression however reports she is able to cope with it  Affect:  Appropriate and Congruent  Thought Process:  Coherent, Goal Directed and Linear  Orientation:  Full  (Time, Place, and Person)  Thought Content: WDL and Logical   Suicidal Thoughts:  No  Homicidal Thoughts:  No  Memory:  Immediate;   Good Recent;   Good Remote;   Good  Judgement:  Good  Insight:  Good  Psychomotor Activity:  Normal  Concentration:  Concentration: Good and Attention Span: Good  Recall:  Good  Fund of Knowledge: Good  Language: Good  Akathisia:  No  Handed:  Right  AIMS (if indicated): Not done  Assets:  Communication Skills Desire for Improvement Financial Resources/Insurance Housing Social Support  ADL's:  Intact  Cognition: WNL  Sleep:  Good   Screenings: GAD-7   Flowsheet Row Clinical Support from 11/05/2020 in [facility] Office Visit from 05/22/2020 in Primary Care at [facility] Counselor from 02/10/2020 in [facility] Office Visit from 12/22/2019 in [facility] Office Visit from 12/05/2019 in [facility]  Total GAD-7 Score 12 18 18 21 19     PHQ2-9   Flowsheet Row Clinical Support from 11/05/2020 in [facility] Office Visit from 05/22/2020 in Primary Care at [facility] Counselor from 02/10/2020 in [facility] Office Visit from 12/22/2019 in [facility] Office Visit from 12/05/2019 in [facility]  PHQ-2 Total Score 2 4 4 5 6   PHQ-9 Total Score 5 15 10 23 23        Assessment and Plan: Patient reports that overall she is doing well on her medication regimen.  No medication changes made today.  Patient agreeable to continue medications as prescribed.  1. MDD (major depressive disorder), recurrent, in partial remission (HCC)  Continue- sertraline (ZOLOFT) 100 MG tablet; TAKE 1 TABLET (100 MG TOTAL) BY MOUTH DAILY WITH BREAKFAST.  Dispense: 30 tablet; Refill: 2 Continue- QUEtiapine (SEROQUEL) 200 MG tablet; TAKE 1 TABLET (200 MG TOTAL) BY MOUTH AT BEDTIME.   Dispense: 90 tablet; Refill: 2  2. Anxiety  Continue- hydrOXYzine (ATARAX/VISTARIL) 50 MG tablet; TAKE 1 TABLET (50 MG TOTAL) BY MOUTH 3 (THREE) TIMES DAILY AS NEEDED FOR ANXIETY.  Dispense: 90 tablet; Refill: 2  Follow-up in 3 months Follow-up with therapy  [facility], NP 11/05/2020, 10:24 AM

## 2020-11-07 ENCOUNTER — Other Ambulatory Visit: Payer: Self-pay

## 2020-11-07 ENCOUNTER — Ambulatory Visit: Payer: Medicaid Other

## 2020-11-07 ENCOUNTER — Telehealth (INDEPENDENT_AMBULATORY_CARE_PROVIDER_SITE_OTHER): Payer: Medicaid Other | Admitting: Family Medicine

## 2020-11-07 DIAGNOSIS — M87051 Idiopathic aseptic necrosis of right femur: Secondary | ICD-10-CM

## 2020-11-07 MED ORDER — HYDROCODONE-ACETAMINOPHEN 5-325 MG PO TABS: 1.0000 | ORAL_TABLET | Freq: Three times a day (TID) | ORAL | 0 refills | Status: DC | PRN

## 2020-11-07 NOTE — Progress Notes (Signed)
Virtual Visit via Video Note  I connected with [name] on 11/07/20 at  8:10 AM EDT by a video enabled telemedicine application and verified that I am speaking with the correct person using two identifiers.  Location: Patient: home Provider: office    I discussed the limitations of evaluation and management by telemedicine and the availability of in person appointments. The patient expressed understanding and agreed to proceed.  History of Present Illness:  [name] is a 60 year old female that is following up after the MRI of her right hip.  This was demonstrating avascular necrosis of the femoral head.  She has gotten significantly more painful through the course of today and this week.  She is having pain with any movement.   Observations/Objective:   Assessment and Plan:  Avascular necrosis of right hip joint: Having pain globally around the hip for some time now.  MRI was demonstrating avascular necrosis. -Counseled on home exercise therapy and supportive care. -Norco. -Referral to orthopedic surgery.  Follow Up Instructions:    I discussed the assessment and treatment plan with the patient. The patient was provided an opportunity to ask questions and all were answered. The patient agreed with the plan and demonstrated an understanding of the instructions.   The patient was advised to call back or seek an in-person evaluation if the symptoms worsen or if the condition fails to improve as anticipated.  I provided 7 minutes of non-face-to-face time during this encounter.   Clare Gandy, MD

## 2020-11-07 NOTE — Assessment & Plan Note (Signed)
Having pain globally around the hip for some time now.  MRI was demonstrating avascular necrosis. -Counseled on home exercise therapy and supportive care. -Norco. -Referral to orthopedic surgery.

## 2020-11-14 ENCOUNTER — Other Ambulatory Visit: Payer: Self-pay | Admitting: Family Medicine

## 2020-11-14 ENCOUNTER — Telehealth: Payer: Self-pay | Admitting: Family Medicine

## 2020-11-14 MED ORDER — HYDROCODONE-ACETAMINOPHEN 5-325 MG PO TABS: 1.0000 | ORAL_TABLET | Freq: Three times a day (TID) | ORAL | 0 refills | Status: DC | PRN

## 2020-11-14 NOTE — Progress Notes (Signed)
Refilled norco.   Myra Rude, MD [facility] 11/14/2020, 9:08 AM

## 2020-11-23 ENCOUNTER — Other Ambulatory Visit: Payer: Self-pay | Admitting: Orthopedic Surgery

## 2020-11-23 DIAGNOSIS — Z01818 Encounter for other preprocedural examination: Secondary | ICD-10-CM

## 2020-12-02 ENCOUNTER — Other Ambulatory Visit (INDEPENDENT_AMBULATORY_CARE_PROVIDER_SITE_OTHER): Payer: Self-pay | Admitting: Primary Care

## 2020-12-11 ENCOUNTER — Ambulatory Visit (INDEPENDENT_AMBULATORY_CARE_PROVIDER_SITE_OTHER): Payer: Medicaid Other | Admitting: Internal Medicine

## 2020-12-11 ENCOUNTER — Other Ambulatory Visit: Payer: Self-pay

## 2020-12-11 ENCOUNTER — Encounter: Payer: Self-pay | Admitting: Internal Medicine

## 2020-12-11 VITALS — BP 155/83 | HR 101 | Temp 97.9°F | Resp 24 | Wt 215.0 lb

## 2020-12-11 DIAGNOSIS — M87051 Idiopathic aseptic necrosis of right femur: Secondary | ICD-10-CM | POA: Diagnosis not present

## 2020-12-11 DIAGNOSIS — Z01818 Encounter for other preprocedural examination: Secondary | ICD-10-CM

## 2020-12-11 DIAGNOSIS — J449 Chronic obstructive pulmonary disease, unspecified: Secondary | ICD-10-CM

## 2020-12-11 DIAGNOSIS — K219 Gastro-esophageal reflux disease without esophagitis: Secondary | ICD-10-CM

## 2020-12-11 MED ORDER — TRELEGY ELLIPTA 100-62.5-25 MCG/INH IN AEPB
1.0000 | INHALATION_SPRAY | Freq: Every day | RESPIRATORY_TRACT | 5 refills | Status: DC
  Filled 2020-12-11: qty 28, fill #0

## 2020-12-11 MED ORDER — TRELEGY ELLIPTA 100-62.5-25 MCG/INH IN AEPB: 1.0000 | INHALATION_SPRAY | Freq: Every day | RESPIRATORY_TRACT | 5 refills | Status: DC

## 2020-12-11 MED ORDER — PANTOPRAZOLE SODIUM 40 MG PO TBEC: 40.0000 mg | DELAYED_RELEASE_TABLET | Freq: Two times a day (BID) | ORAL | 5 refills | Status: DC

## 2020-12-11 NOTE — Progress Notes (Signed)
Subjective:    [name] - 60 y.o. female MRN [id number]  Date of birth: [birth year]  HPI   [name] is here for surgical clearance. Patient is scheduled to have right total hip arthoplasty on 5/23 for avascular necrosis. Patient has had surgery before. No prior issues with anesthesia. Patient has a PMH of HTN, liver cirrhosis, COPD, anxiety, and depression. No PMH of kidney disease, CAD, MI, CVA. Patient is a smoker. No family history of premature death. Denies current chest pain, SOB, severe headaches. .   Health Maintenance:  Health Maintenance Due  Topic Date Due  . COVID-19 Vaccine (1) Never done  . TETANUS/TDAP  Never done  . PAP SMEAR-Modifier  Never done  . COLONOSCOPY (Pts 45-37yrs Insurance coverage will need to be confirmed)  Never done  . MAMMOGRAM  Never done    -  reports that she has been smoking cigarettes. She started smoking about 42 years ago. She has a 25.00 pack-year smoking history. She has never used smokeless tobacco. - Review of Systems: Per HPI. - Past Medical History: Patient Active Problem List   Diagnosis Date Noted  . Avascular necrosis of bone of right hip (HCC) 09/17/2020  . Greater trochanteric pain syndrome of left lower extremity 07/24/2020  . Rheumatoid factor positive 06/26/2020  . CRP elevated 06/26/2020  . Bilateral hand pain 06/26/2020  . Stasis dermatitis of both legs 06/26/2020  . Alcohol use disorder, severe, in sustained remission (HCC) 04/30/2020  . Chronic sinusitis 04/10/2020  . Liver cirrhosis (HCC) 03/08/2020  . Major depressive disorder, recurrent episode, moderate with anxious distress (HCC) 02/11/2020  . Alcohol abuse, in remission 12/16/2019  . Nicotine dependence, cigarettes, uncomplicated 12/16/2019  . Generalized anxiety disorder 12/16/2019   - Medications: reviewed and updated   Objective:   Physical Exam BP (!) 155/83 (BP Location: Right Arm, Patient Position: Sitting, Cuff Size: Large)   Pulse (!) 101   Temp  97.9 F (36.6 C)   Resp (!) 24   Wt 215 lb (97.5 kg)   SpO2 93%   BMI 39.32 kg/m  Physical Exam Constitutional:      General: She is not in acute distress.    Appearance: She is not diaphoretic.  HENT:     Head: Normocephalic and atraumatic.  Eyes:     Conjunctiva/sclera: Conjunctivae normal.  Cardiovascular:     Rate and Rhythm: Normal rate and regular rhythm.     Heart sounds: Normal heart sounds. No murmur heard.   Pulmonary:     Effort: Pulmonary effort is normal. No respiratory distress.     Breath sounds: Normal breath sounds.  Musculoskeletal:        General: Normal range of motion.  Skin:    General: Skin is warm and dry.  Neurological:     Mental Status: She is alert and oriented to person, place, and time.  Psychiatric:        Mood and Affect: Affect normal.        Judgment: Judgment normal.       Assessment & Plan:    1. Preoperative clearance Risk factors of HTN, smoker, obese. NSQIP 0% chance of cardiac complication and 1.9% chance of serious complication. Cardiac risk index score of 0. Will send EKG and lab results to surgeon.  - Comprehensive metabolic panel - CBC - Hemoglobin A1c - Lipid panel - EKG 12-Lead  2. Avascular necrosis of bone of right hip (HCC)  3. Chronic obstructive pulmonary disease, unspecified COPD type (HCC) -  Fluticasone-Umeclidin-Vilant (TRELEGY ELLIPTA) 100-62.5-25 MCG/INH AEPB; Inhale 1 puff into the lungs daily.  Dispense: 28 each; Refill: 5  4. Gastroesophageal reflux disease without esophagitis - pantoprazole (PROTONIX) 40 MG tablet; Take 1 tablet (40 mg total) by mouth 2 (two) times daily.  Dispense: 60 tablet; Refill: 5   Marcy Siren, D.O. 12/11/2020, 2:21 PM Primary Care at [facility]

## 2020-12-11 NOTE — Progress Notes (Signed)
Here for medical clearance.  Pain 8/10 hip  Change Rx for inhaler and GERD medication congestion

## 2020-12-12 LAB — COMPREHENSIVE METABOLIC PANEL
ALT: 34 IU/L — ABNORMAL HIGH (ref 0–32)
AST: 50 IU/L — ABNORMAL HIGH (ref 0–40)
Albumin/Globulin Ratio: 2 (ref 1.2–2.2)
Albumin: 4.5 g/dL (ref 3.8–4.9)
Alkaline Phosphatase: 134 IU/L — ABNORMAL HIGH (ref 44–121)
BUN/Creatinine Ratio: 9 — ABNORMAL LOW (ref 12–28)
BUN: 6 mg/dL — ABNORMAL LOW (ref 8–27)
Bilirubin Total: 0.3 mg/dL (ref 0.0–1.2)
CO2: 28 mmol/L (ref 20–29)
Calcium: 9.7 mg/dL (ref 8.7–10.3)
Chloride: 97 mmol/L (ref 96–106)
Creatinine, Ser: 0.68 mg/dL (ref 0.57–1.00)
Globulin, Total: 2.3 g/dL (ref 1.5–4.5)
Glucose: 169 mg/dL — ABNORMAL HIGH (ref 65–99)
Potassium: 3.1 mmol/L — ABNORMAL LOW (ref 3.5–5.2)
Sodium: 144 mmol/L (ref 134–144)
Total Protein: 6.8 g/dL (ref 6.0–8.5)
eGFR: 100 mL/min/{1.73_m2} (ref >59)

## 2020-12-12 LAB — HEMOGLOBIN A1C
Est. average glucose Bld gHb Est-mCnc: 143 mg/dL
Hgb A1c MFr Bld: 6.6 % — ABNORMAL HIGH (ref 4.8–5.6)

## 2020-12-12 LAB — CBC
Hematocrit: 38.7 % (ref 34.0–46.6)
Hemoglobin: 13.1 g/dL (ref 11.1–15.9)
MCH: 32.3 pg (ref 26.6–33.0)
MCHC: 33.9 g/dL (ref 31.5–35.7)
MCV: 96 fL (ref 79–97)
Platelets: 409 10*3/uL (ref 150–450)
RBC: 4.05 x10E6/uL (ref 3.77–5.28)
RDW: 11.8 % (ref 11.7–15.4)
WBC: 9.8 10*3/uL (ref 3.4–10.8)

## 2020-12-12 LAB — LIPID PANEL
Chol/HDL Ratio: 4.3 ratio (ref 0.0–4.4)
Cholesterol, Total: 234 mg/dL — ABNORMAL HIGH (ref 100–199)
HDL: 55 mg/dL (ref >39)
LDL Chol Calc (NIH): 135 mg/dL — ABNORMAL HIGH (ref 0–99)
Triglycerides: 245 mg/dL — ABNORMAL HIGH (ref 0–149)
VLDL Cholesterol Cal: 44 mg/dL — ABNORMAL HIGH (ref 5–40)

## 2020-12-13 ENCOUNTER — Ambulatory Visit (HOSPITAL_COMMUNITY): Payer: Medicaid Other | Admitting: Clinical

## 2020-12-14 NOTE — Patient Instructions (Addendum)
DUE TO COVID-19 ONLY ONE VISITOR IS ALLOWED TO COME WITH YOU AND STAY IN THE WAITING ROOM ONLY DURING PRE OP AND PROCEDURE DAY OF SURGERY. THE 2 VISITORS  MAY VISIT WITH YOU AFTER SURGERY IN YOUR PRIVATE ROOM DURING VISITING HOURS ONLY!  YOU NEED TO HAVE A COVID 19 TEST ON_5/19______ @__2 :30 PM_____, THIS TEST MUST BE DONE BEFORE SURGERY,  COVID TESTING SITE [address] [place] , IT IS ON THE RIGHT GOING OUT [address] APPROXIMATELY  2 MINUTES PAST [employer] ON THE RIGHT. ONCE YOUR COVID TEST IS COMPLETED,  PLEASE BEGIN THE QUARANTINE INSTRUCTIONS AS OUTLINED IN YOUR HANDOUT.                Zayneb Bagdasarian     Your procedure is scheduled on: 12/24/20   Report to [facility] Main  Entrance   Report to admitting at   7:05  AM     Call this number if you have problems the morning of surgery [phone]    BRUSH YOUR TEETH MORNING OF SURGERY AND RINSE YOUR MOUTH OUT, NO CHEWING GUM CANDY OR MINTS.   No food after midnight.    You may have clear liquid until 6:30 AM.    At 6:00 AM drink pre surgery drink.   Nothing by mouth after 6:30 AM.   Take these medicines the morning of surgery with A SIP OF WATER: Zoloft, Amlodipine, protonix                                 You may not have any metal on your body including hair pins and              piercings  Do not wear jewelry, make-up, lotions, powders or perfumes, deodorant             Do not wear nail polish on your fingernails.  Do not shave  48 hours prior to surgery.     Do not bring valuables to the hospital. [facility] IS NOT             RESPONSIBLE   FOR VALUABLES.  Contacts, dentures or bridgework may not be worn into surgery.                  Please read over the following fact sheets you were given: _____________________________________________________________________             [facility] - Preparing for Surgery Before surgery, you can play an important role.  Because  skin is not sterile, your skin needs to be as free of germs as possible.  You can reduce the number of germs on your skin by washing with CHG (chlorahexidine gluconate) soap before surgery.  CHG is an antiseptic cleaner which kills germs and bonds with the skin to continue killing germs even after washing. Please DO NOT use if you have an allergy to CHG or antibacterial soaps.  If your skin becomes reddened/irritated stop using the CHG and inform your nurse when you arrive at Short Stay. Do not shave (including legs and underarms) for at least 48 hours prior to the first CHG shower.    Please follow these instructions carefully:  1.  Shower with CHG Soap the night before surgery and the  morning of Surgery.  2.  If you choose to wash your hair, wash your hair first as usual with your  normal  shampoo.  3.  After you shampoo, rinse your hair and body thoroughly to remove the  shampoo.                                        4.  Use CHG as you would any other liquid soap.  You can apply chg directly  to the skin and wash                       Gently with a scrungie or clean washcloth.  5.  Apply the CHG Soap to your body ONLY FROM THE NECK DOWN.   Do not use on face/ open                           Wound or open sores. Avoid contact with eyes, ears mouth and genitals (private parts).                       Wash face,  Genitals (private parts) with your normal soap.             6.  Wash thoroughly, paying special attention to the area where your surgery  will be performed.  7.  Thoroughly rinse your body with warm water from the neck down.  8.  DO NOT shower/wash with your normal soap after using and rinsing off  the CHG Soap.             9.  Pat yourself dry with a clean towel.            10.  Wear clean pajamas.            11.  Place clean sheets on your bed the night of your first shower and do not  sleep with pets. Day of Surgery : Do not apply any lotions/deodorants the morning of surgery.  Please  wear clean clothes to the hospital/surgery center.  FAILURE TO FOLLOW THESE INSTRUCTIONS MAY RESULT IN THE CANCELLATION OF YOUR SURGERY PATIENT SIGNATURE_________________________________  NURSE SIGNATURE__________________________________  ________________________________________________________________________   Rogelia Mire  An incentive spirometer is a tool that can help keep your lungs clear and active. This tool measures how well you are filling your lungs with each breath. Taking long deep breaths may help reverse or decrease the chance of developing breathing (pulmonary) problems (especially infection) following:  A long period of time when you are unable to move or be active. BEFORE THE PROCEDURE   If the spirometer includes an indicator to show your best effort, your nurse or respiratory therapist will set it to a desired goal.  If possible, sit up straight or lean slightly forward. Try not to slouch.  Hold the incentive spirometer in an upright position. INSTRUCTIONS FOR USE  1. Sit on the edge of your bed if possible, or sit up as far as you can in bed or on a chair. 2. Hold the incentive spirometer in an upright position. 3. Breathe out normally. 4. Place the mouthpiece in your mouth and seal your lips tightly around it. 5. Breathe in slowly and as deeply as possible, raising the piston or the ball toward the top of the column. 6. Hold your breath for 3-5 seconds or for as long as possible. Allow the piston or ball to fall to the bottom of the  column. 7. Remove the mouthpiece from your mouth and breathe out normally. 8. Rest for a few seconds and repeat Steps 1 through 7 at least 10 times every 1-2 hours when you are awake. Take your time and take a few normal breaths between deep breaths. 9. The spirometer may include an indicator to show your best effort. Use the indicator as a goal to work toward during each repetition. 10. After each set of 10 deep breaths,  practice coughing to be sure your lungs are clear. If you have an incision (the cut made at the time of surgery), support your incision when coughing by placing a pillow or rolled up towels firmly against it. Once you are able to get out of bed, walk around indoors and cough well. You may stop using the incentive spirometer when instructed by your caregiver.  RISKS AND COMPLICATIONS  Take your time so you do not get dizzy or light-headed.  If you are in pain, you may need to take or ask for pain medication before doing incentive spirometry. It is harder to take a deep breath if you are having pain. AFTER USE  Rest and breathe slowly and easily.  It can be helpful to keep track of a log of your progress. Your caregiver can provide you with a simple table to help with this. If you are using the spirometer at home, follow these instructions: SEEK MEDICAL CARE IF:   You are having difficultly using the spirometer.  You have trouble using the spirometer as often as instructed.  Your pain medication is not giving enough relief while using the spirometer.  You develop fever of 100.5 F (38.1 C) or higher. SEEK IMMEDIATE MEDICAL CARE IF:   You cough up bloody sputum that had not been present before.  You develop fever of 102 F (38.9 C) or greater.  You develop worsening pain at or near the incision site. MAKE SURE YOU:   Understand these instructions.  Will watch your condition.  Will get help right away if you are not doing well or get worse. Document Released: 12/01/2006 Document Revised: 10/13/2011 Document Reviewed: 02/01/2007 [facility] Patient Information 2014 [place], [place].   ________________________________________________________________________

## 2020-12-17 ENCOUNTER — Other Ambulatory Visit: Payer: Self-pay

## 2020-12-17 ENCOUNTER — Encounter (HOSPITAL_COMMUNITY): Payer: Self-pay

## 2020-12-17 ENCOUNTER — Encounter (HOSPITAL_COMMUNITY)
Admission: RE | Admit: 2020-12-17 | Discharge: 2020-12-17 | Disposition: A | Discharge status: Home / Self Care | Payer: Medicaid Other | Source: Ambulatory Visit | Attending: Orthopedic Surgery | Admitting: Orthopedic Surgery

## 2020-12-17 ENCOUNTER — Ambulatory Visit (HOSPITAL_COMMUNITY)
Admission: RE | Admit: 2020-12-17 | Discharge: 2020-12-17 | Disposition: A | Discharge status: Home / Self Care | Payer: Medicaid Other | Source: Ambulatory Visit | Attending: Orthopedic Surgery | Admitting: Orthopedic Surgery

## 2020-12-17 DIAGNOSIS — Z01818 Encounter for other preprocedural examination: Secondary | ICD-10-CM | POA: Insufficient documentation

## 2020-12-17 HISTORY — DX: Gastro-esophageal reflux disease without esophagitis: K21.9

## 2020-12-17 HISTORY — DX: Dyspnea, unspecified: R06.00

## 2020-12-17 HISTORY — DX: Unspecified osteoarthritis, unspecified site: M19.90

## 2020-12-17 HISTORY — DX: Edema, unspecified: R60.9

## 2020-12-17 HISTORY — DX: Anxiety disorder, unspecified: F41.9

## 2020-12-17 HISTORY — DX: Depression, unspecified: F32.A

## 2020-12-17 HISTORY — DX: Localized edema: R60.0

## 2020-12-17 LAB — CBC WITH DIFFERENTIAL/PLATELET
Abs Immature Granulocytes: 0.06 10*3/uL (ref 0.00–0.07)
Basophils Absolute: 0.1 10*3/uL (ref 0.0–0.1)
Basophils Relative: 1 %
Eosinophils Absolute: 0.3 10*3/uL (ref 0.0–0.5)
Eosinophils Relative: 3 %
HCT: 44.4 % (ref 36.0–46.0)
Hemoglobin: 14.8 g/dL (ref 12.0–15.0)
Immature Granulocytes: 1 %
Lymphocytes Relative: 20 %
Lymphs Abs: 2.5 10*3/uL (ref 0.7–4.0)
MCH: 32.9 pg (ref 26.0–34.0)
MCHC: 33.3 g/dL (ref 30.0–36.0)
MCV: 98.7 fL (ref 80.0–100.0)
Monocytes Absolute: 1.2 10*3/uL — ABNORMAL HIGH (ref 0.1–1.0)
Monocytes Relative: 10 %
Neutro Abs: 8.3 10*3/uL — ABNORMAL HIGH (ref 1.7–7.7)
Neutrophils Relative %: 65 %
Platelets: 421 10*3/uL — ABNORMAL HIGH (ref 150–400)
RBC: 4.5 MIL/uL (ref 3.87–5.11)
RDW: 11.9 % (ref 11.5–15.5)
WBC: 12.6 10*3/uL — ABNORMAL HIGH (ref 4.0–10.5)
nRBC: 0 % (ref 0.0–0.2)

## 2020-12-17 LAB — URINALYSIS, ROUTINE W REFLEX MICROSCOPIC
Bacteria, UA: NONE SEEN
Bilirubin Urine: NEGATIVE
Glucose, UA: NEGATIVE mg/dL
Hgb urine dipstick: NEGATIVE
Ketones, ur: NEGATIVE mg/dL
Leukocytes,Ua: NEGATIVE
Nitrite: NEGATIVE
Protein, ur: NEGATIVE mg/dL
Specific Gravity, Urine: 1.009 (ref 1.005–1.030)
pH: 8 (ref 5.0–8.0)

## 2020-12-17 LAB — BASIC METABOLIC PANEL
Anion gap: 15 (ref 5–15)
BUN: 6 mg/dL (ref 6–20)
CO2: 30 mmol/L (ref 22–32)
Calcium: 9.8 mg/dL (ref 8.9–10.3)
Chloride: 101 mmol/L (ref 98–111)
Creatinine, Ser: 0.61 mg/dL (ref 0.44–1.00)
GFR, Estimated: >60 mL/min (ref >60)
Glucose, Bld: 138 mg/dL — ABNORMAL HIGH (ref 70–99)
Potassium: 4.2 mmol/L (ref 3.5–5.1)
Sodium: 146 mmol/L — ABNORMAL HIGH (ref 135–145)

## 2020-12-17 LAB — PROTIME-INR
INR: 1 (ref 0.8–1.2)
Prothrombin Time: 13.1 seconds (ref 11.4–15.2)

## 2020-12-17 LAB — APTT: aPTT: 28 seconds (ref 24–36)

## 2020-12-17 LAB — SURGICAL PCR SCREEN
MRSA, PCR: NEGATIVE
Staphylococcus aureus: NEGATIVE

## 2020-12-17 IMAGING — DX DG CHEST 2V
2 series · 2 of 2 positions shown · non-contrast
Comparison: [DATE]

CLINICAL DATA: 59-year-old female with a history of pending surgery

EXAM:
CHEST - 2 VIEW

[chest pa]
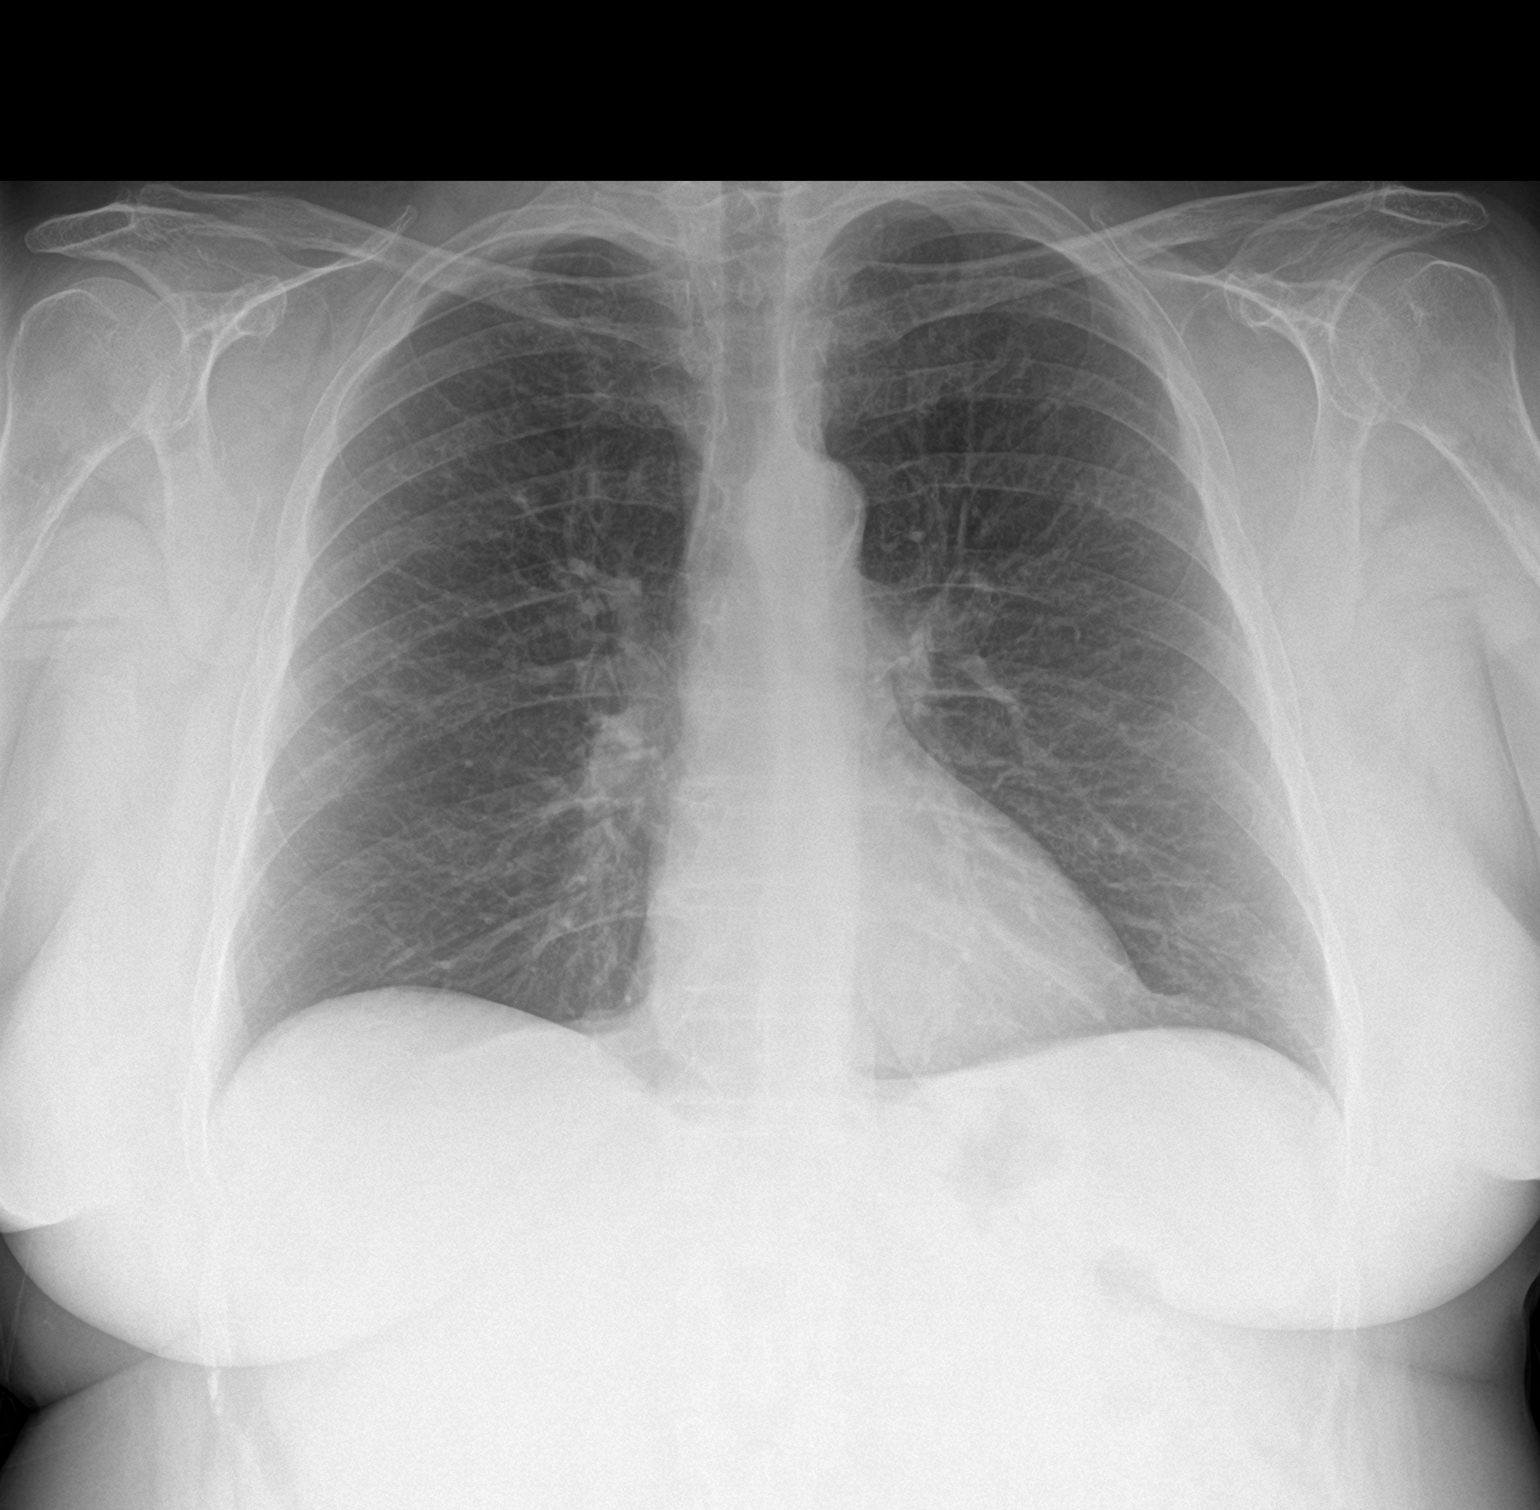

[chest lat]
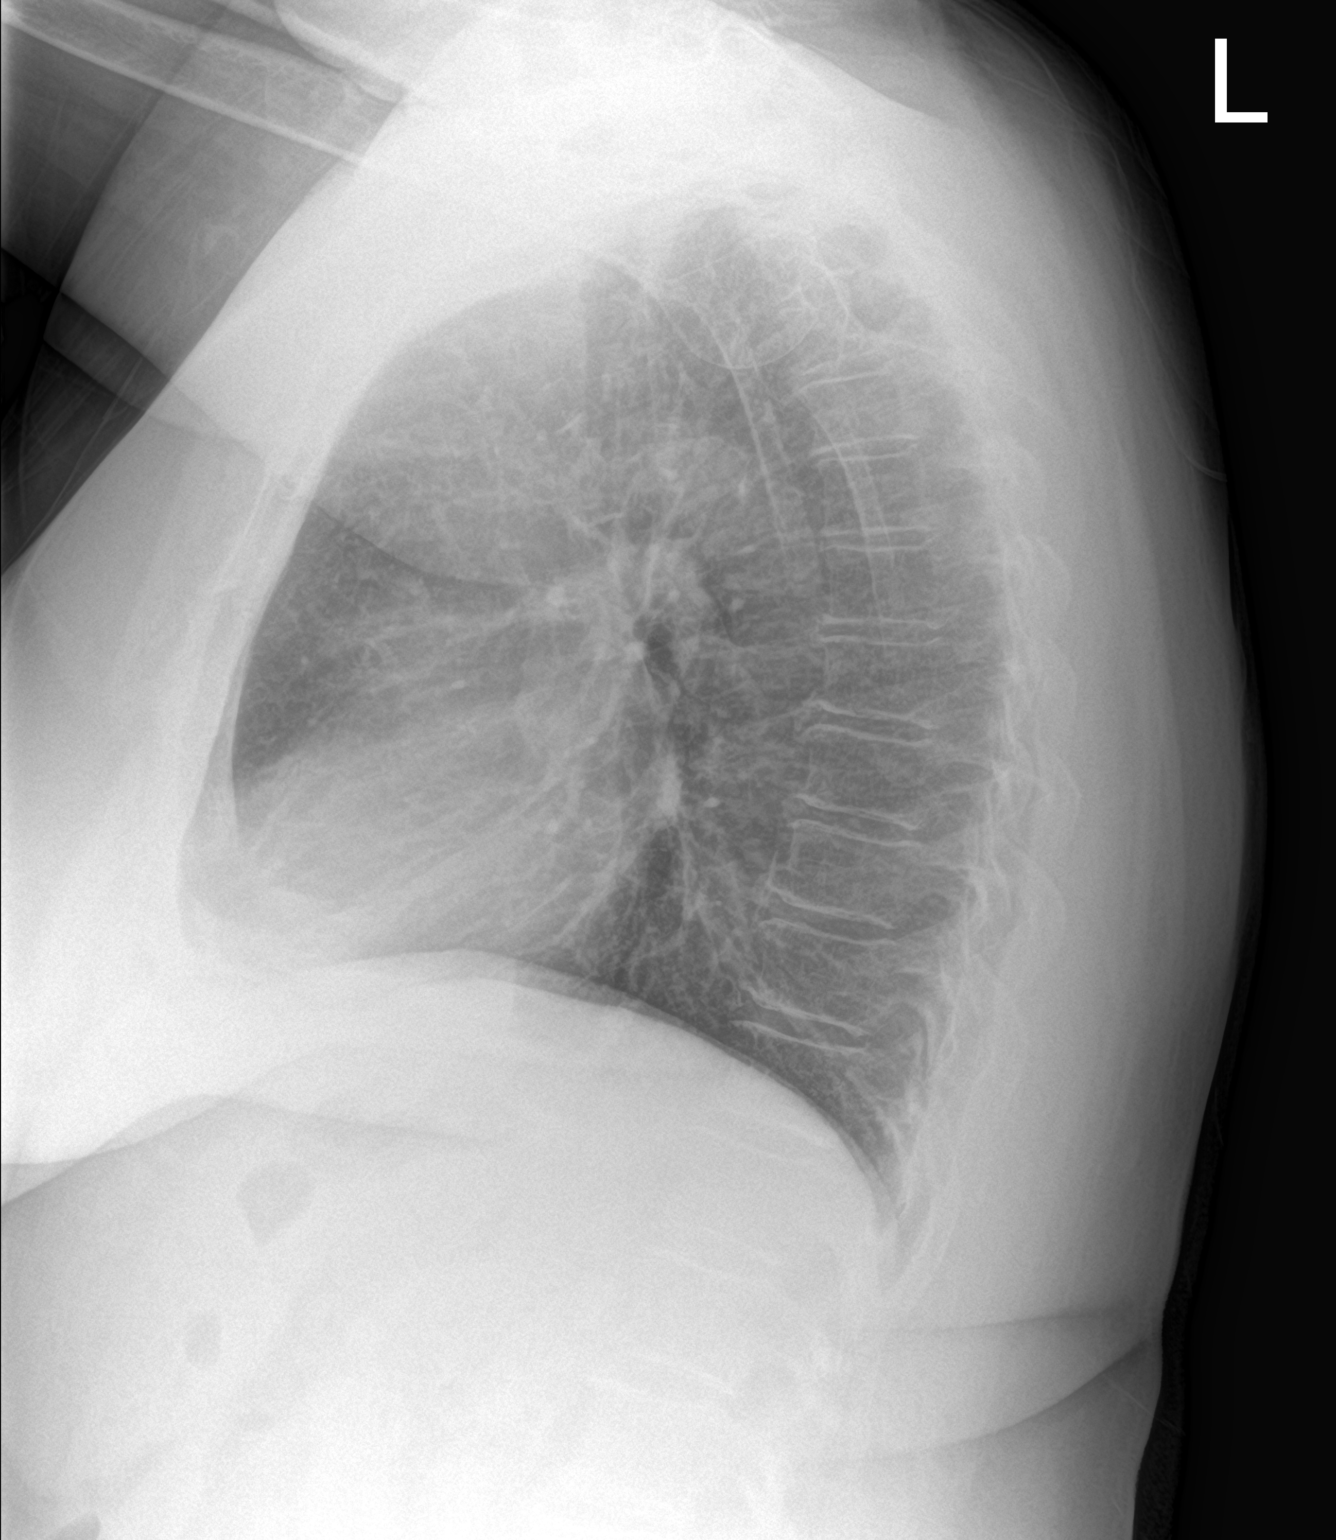

[2 of 2 positions shown; findings below may reference images not displayed]

FINDINGS: Cardiomediastinal silhouette unchanged in size and contour. No
pneumothorax. No pleural effusion. No confluent airspace disease.
Coarsened interstitial markings similar to the prior.

No acute displaced fracture.
IMPRESSION: Negative for acute cardiopulmonary disease

## 2020-12-17 NOTE — Progress Notes (Addendum)
COVID Vaccine Completed:No Date COVID Vaccine completed: COVID vaccine manufacturer: Pfizer    [facility]   PCP - Dwana Melena DO Cardiologist - none  Chest x-ray - 12/17/20-epic EKG - 12/11/20-epic Stress Test - no ECHO - 11/05/19-epic Cardiac Cath - no Pacemaker/ICD device last checked:NA  Sleep Study - no CPAP -   Fasting Blood Sugar - NA Checks Blood Sugar _____ times a day  Blood Thinner Instructions:NA Aspirin Instructions: Last Dose:  Anesthesia review:   Patient denies shortness of breath, fever, cough and chest pain at PAT appointment Yes. Pt has COPD and uses an inhaler. She also has anxiety. She reports having SOB with most activities around the house. She lives with her parents. Her peripheral edema was 2+ at PAT visit.She doesn't ware support hose.  Patient verbalized understanding of instructions that were given to them at the PAT appointment. Patient was also instructed that they will need to review over the PAT instructions again at home before surgery.yes

## 2020-12-18 ENCOUNTER — Other Ambulatory Visit: Payer: Self-pay

## 2020-12-18 ENCOUNTER — Other Ambulatory Visit: Payer: Self-pay | Admitting: Internal Medicine

## 2020-12-18 ENCOUNTER — Encounter: Payer: Self-pay | Admitting: Internal Medicine

## 2020-12-18 DIAGNOSIS — E119 Type 2 diabetes mellitus without complications: Secondary | ICD-10-CM | POA: Insufficient documentation

## 2020-12-18 DIAGNOSIS — E785 Hyperlipidemia, unspecified: Secondary | ICD-10-CM

## 2020-12-18 MED ORDER — ATORVASTATIN CALCIUM 40 MG PO TABS
40.0000 mg | ORAL_TABLET | Freq: Every day | ORAL | 3 refills | Status: DC
  Filled 2020-12-18: qty 30, 30d supply, fill #0

## 2020-12-19 ENCOUNTER — Telehealth: Payer: Self-pay | Admitting: Internal Medicine

## 2020-12-19 NOTE — Telephone Encounter (Signed)
Forms received and placed on Dr. Earlene Plater desk.

## 2020-12-19 NOTE — H&P (Signed)
TOTAL HIP ADMISSION H&P  Patient is admitted for right total hip arthroplasty.  Subjective:  Chief Complaint: right hip pain  HPI: [name], 60 y.o. female, has a history of pain and functional disability in the right hip(s) due to AVN and patient has failed non-surgical conservative treatments for greater than 12 weeks to include flexibility and strengthening excercises, weight reduction as appropriate and activity modification.  Onset of symptoms was gradual starting couple years ago with rapidlly worsening course since that time.The patient noted no past surgery on the right hip(s).  Patient currently rates pain in the right hip at 10 out of 10 with activity. Patient has night pain, worsening of pain with activity and weight bearing, trendelenberg gait, pain that interfers with activities of daily living and pain with passive range of motion. Patient has evidence of collapse of femoral head by imaging studies. This condition presents safety issues increasing the risk of falls. This patient has had avascular necrosis of the hip, acetabular fracture, hip dysplasia.  There is no current active infection.  Patient Active Problem List   Diagnosis Date Noted  . Type 2 diabetes mellitus (HCC) 12/18/2020  . Avascular necrosis of bone of right hip (HCC) 09/17/2020  . Greater trochanteric pain syndrome of left lower extremity 07/24/2020  . Rheumatoid factor positive 06/26/2020  . CRP elevated 06/26/2020  . Bilateral hand pain 06/26/2020  . Stasis dermatitis of both legs 06/26/2020  . Alcohol use disorder, severe, in sustained remission (HCC) 04/30/2020  . Chronic sinusitis 04/10/2020  . Liver cirrhosis (HCC) 03/08/2020  . Major depressive disorder, recurrent episode, moderate with anxious distress (HCC) 02/11/2020  . Alcohol abuse, in remission 12/16/2019  . Nicotine dependence, cigarettes, uncomplicated 12/16/2019  . Generalized anxiety disorder 12/16/2019   Past Medical History:  Diagnosis  Date  . Anxiety   . Arthritis   . COPD (chronic obstructive pulmonary disease) (HCC)   . Depression   . Dyspnea   . GERD (gastroesophageal reflux disease)   . Hypertension   . Peripheral edema    2+ at PAT visit    Past Surgical History:  Procedure Laterality Date  . ABDOMINAL HYSTERECTOMY  1986  . CARPAL TUNNEL RELEASE Bilateral 1980   Per patient  . SHOULDER SURGERY Left 1980    No current facility-administered medications for this encounter.   Current Outpatient Medications  Medication Sig Dispense Refill Last Dose  . acetaminophen (TYLENOL) 500 MG tablet Take 1,000 mg by mouth every 4 (four) hours as needed for moderate pain or headache.     Marland Kitchen amLODipine (NORVASC) 10 MG tablet TAKE 1 TABLET BY MOUTH DAILY. (Patient taking differently: Take 10 mg by mouth daily.) 90 tablet 2   . Doxylamine-DM (VICKS DAYQUIL/NYQUIL COUGH PO) Take 1 Dose by mouth daily as needed (cough).     . folic acid (FOLVITE) 1 MG tablet TAKE 1 TABLET (1 MG TOTAL) BY MOUTH DAILY. (Patient taking differently: Take 1 mg by mouth daily.) 90 tablet 0   . guaiFENesin (MUCINEX) 600 MG 12 hr tablet Take 600 mg by mouth 2 (two) times daily.     . hydrOXYzine (ATARAX/VISTARIL) 50 MG tablet TAKE 1 TABLET (50 MG TOTAL) BY MOUTH 3 (THREE) TIMES DAILY AS NEEDED FOR ANXIETY. (Patient not taking: Reported on 12/11/2020) 90 tablet 2   . Ketotifen Fumarate (ALLERGY EYE DROPS OP) Place 1 drop into both eyes daily as needed (allergies).     . Menthol (ICY HOT) 5 % PTCH Apply 1 patch topically daily  as needed (hip pain).     Marland Kitchen oxybutynin (DITROPAN-XL) 10 MG 24 hr tablet TAKE 1 TABLET (10 MG TOTAL) BY MOUTH AT BEDTIME. (Patient taking differently: Take 10 mg by mouth at bedtime.) 30 tablet 2   . QUEtiapine (SEROQUEL) 200 MG tablet TAKE 1 TABLET (200 MG TOTAL) BY MOUTH AT BEDTIME. (Patient taking differently: Take 200 mg by mouth at bedtime.) 90 tablet 2   . sertraline (ZOLOFT) 100 MG tablet TAKE 1 TABLET (100 MG TOTAL) BY MOUTH DAILY  WITH BREAKFAST. (Patient taking differently: Take 100 mg by mouth daily.) 30 tablet 2   . traMADol (ULTRAM) 50 MG tablet Take 50-100 mg by mouth every 4 (four) hours as needed for moderate pain.     Marland Kitchen amoxicillin-clavulanate (AUGMENTIN) 875-125 MG tablet TAKE 1 TABLET BY MOUTH 2 TIMES DAILY FOR 10 DAYS. (Patient not taking: No sig reported) 20 tablet 0 Not Taking at Unknown time  . atorvastatin (LIPITOR) 40 MG tablet Take 1 tablet (40 mg total) by mouth daily. 90 tablet 3   . Fluticasone-Umeclidin-Vilant (TRELEGY ELLIPTA) 100-62.5-25 MCG/INH AEPB Inhale 1 puff into the lungs daily. 28 each 5   . furosemide (LASIX) 20 MG tablet TAKE 1 TABLET BY MOUTH DAILY 90 tablet 1 Not Taking at Unknown time  . HYDROcodone-acetaminophen (NORCO/VICODIN) 5-325 MG tablet Take 1 tablet by mouth every 8 (eight) hours as needed. (Patient not taking: No sig reported) 15 tablet 0 Not Taking at Unknown time  . Multiple Vitamin (MULTIVITAMIN WITH MINERALS) TABS tablet Take 1 tablet by mouth daily.     . pantoprazole (PROTONIX) 40 MG tablet Take 1 tablet (40 mg total) by mouth 2 (two) times daily. 60 tablet 5   . POTASSIUM PO Take 1 tablet by mouth daily.     Marland Kitchen VITAMIN D PO Take 1 capsule by mouth daily.      Allergies  Allergen Reactions  . Amoxicillin     Yeast infection     Social History   Tobacco Use  . Smoking status: Current Every Day Smoker    Packs/day: 1.00    Years: 25.00    Pack years: 25.00    Types: Cigarettes    Start date: 08/04/1978  . Smokeless tobacco: Never Used  Substance Use Topics  . Alcohol use: Not Currently    Family History  Problem Relation Age of Onset  . Ulcers Father   . Hypotension Sister   . Hypertension Brother      Review of Systems  Constitutional: Negative.   HENT: Positive for sinus pressure.   Eyes: Negative.   Respiratory: Negative.   Cardiovascular: Positive for leg swelling.  Gastrointestinal: Negative.   Endocrine: Negative.   Genitourinary: Negative.    Musculoskeletal: Positive for arthralgias.  Skin: Negative.   Allergic/Immunologic: Negative.   Neurological: Negative.   Hematological: Bruises/bleeds easily.  Psychiatric/Behavioral: Negative.     Objective:  Physical Exam Constitutional:      Appearance: Normal appearance. She is normal weight.  HENT:     Head: Atraumatic.     Nose: Nose normal.  Eyes:     Pupils: Pupils are equal, round, and reactive to light.  Cardiovascular:     Pulses: Normal pulses.  Pulmonary:     Effort: Pulmonary effort is normal.  Musculoskeletal:     Cervical back: Normal range of motion and neck supple.     Comments: Patient walks with a profound right-sided limp.  Foot tap is mildly positive any attempts at internal or external  rotation of the right hip reproduces her pain.  She has pain with resisted flexion and abduction of the hip.  The left hip has a full range of motion and good power to testing in flexion abduction abduction and extension.    Skin:    General: Skin is warm and dry.  Neurological:     General: No focal deficit present.     Mental Status: She is alert and oriented to person, place, and time. Mental status is at baseline.  Psychiatric:        Mood and Affect: Mood normal.        Behavior: Behavior normal.        Thought Content: Thought content normal.        Judgment: Judgment normal.     Vital signs in last 24 hours:    Labs:   Estimated body mass index is 38.96 kg/m as calculated from the following:   Height as of 12/17/20: 5\' 2"  (1.575 m).   Weight as of 12/17/20: 96.6 kg.   Imaging Review Plain radiographs demonstrate  show flattening of the right femoral head with mottling the left hip has a normal appearance   Assessment/Plan:  End stage arthritis, right hip(s)  The patient history, physical examination, clinical judgement of the provider and imaging studies are consistent with end stage degenerative joint disease of the right hip(s) and total hip  arthroplasty is deemed medically necessary. The treatment options including medical management, injection therapy, arthroscopy and arthroplasty were discussed at length. The risks and benefits of total hip arthroplasty were presented and reviewed. The risks due to aseptic loosening, infection, stiffness, dislocation/subluxation,  thromboembolic complications and other imponderables were discussed.  The patient acknowledged the explanation, agreed to proceed with the plan and consent was signed. Patient is being admitted for inpatient treatment for surgery, pain control, PT, OT, prophylactic antibiotics, VTE prophylaxis, progressive ambulation and ADL's and discharge planning.The patient is planning to be discharged home with home health services    Patient's anticipated LOS is less than 2 midnights, meeting these requirements: - Younger than 39 - Lives within 1 hour of care - Has a competent adult at home to recover with post-op recover - NO history of  - Chronic pain requiring opiods  - Diabetes  - Coronary Artery Disease  - Heart failure  - Heart attack  - Stroke  - DVT/VTE  - Cardiac arrhythmia  - Respiratory Failure/COPD  - Renal failure  - Anemia  - Advanced Liver disease

## 2020-12-19 NOTE — Telephone Encounter (Signed)
Form were given back from Dr. Earlene Plater.  Faxed over to [facility]-

## 2020-12-19 NOTE — Telephone Encounter (Signed)
Chari Manning from [facility] [phone] called needing the surgical clearance for pt. She will fax the form again today.

## 2020-12-20 ENCOUNTER — Other Ambulatory Visit (HOSPITAL_COMMUNITY)
Admission: RE | Admit: 2020-12-20 | Discharge: 2020-12-20 | Disposition: A | Discharge status: Home / Self Care | Payer: Medicaid Other | Source: Ambulatory Visit | Attending: Orthopedic Surgery | Admitting: Orthopedic Surgery

## 2020-12-20 DIAGNOSIS — Z20822 Contact with and (suspected) exposure to covid-19: Secondary | ICD-10-CM | POA: Diagnosis not present

## 2020-12-20 DIAGNOSIS — Z01812 Encounter for preprocedural laboratory examination: Secondary | ICD-10-CM | POA: Insufficient documentation

## 2020-12-20 LAB — SARS CORONAVIRUS 2 (TAT 6-24 HRS): SARS Coronavirus 2: NEGATIVE

## 2020-12-23 MED ORDER — BUPIVACAINE LIPOSOME 1.3 % IJ SUSP
10.0000 mL | Freq: Once | INTRAMUSCULAR | Status: DC
  Filled 2020-12-23: qty 10

## 2020-12-23 MED ORDER — TRANEXAMIC ACID 1000 MG/10ML IV SOLN
2000.0000 mg | INTRAVENOUS | Status: DC
  Filled 2020-12-23: qty 20

## 2020-12-24 ENCOUNTER — Ambulatory Visit (HOSPITAL_COMMUNITY): Payer: Medicaid Other | Admitting: Certified Registered Nurse Anesthetist

## 2020-12-24 ENCOUNTER — Encounter (HOSPITAL_COMMUNITY): Payer: Self-pay | Admitting: Orthopedic Surgery

## 2020-12-24 ENCOUNTER — Encounter (HOSPITAL_COMMUNITY)
Admission: RE | Disposition: A | Discharge status: Home / Self Care | Payer: Self-pay | Source: Home / Self Care | Attending: Orthopedic Surgery

## 2020-12-24 ENCOUNTER — Ambulatory Visit (HOSPITAL_COMMUNITY)
Admission: RE | Admit: 2020-12-24 | Discharge: 2020-12-25 | Disposition: A | Discharge status: Home / Self Care | Payer: Medicaid Other | Attending: Orthopedic Surgery | Admitting: Orthopedic Surgery

## 2020-12-24 ENCOUNTER — Ambulatory Visit (HOSPITAL_COMMUNITY): Payer: Medicaid Other

## 2020-12-24 ENCOUNTER — Other Ambulatory Visit: Payer: Self-pay

## 2020-12-24 DIAGNOSIS — Z79899 Other long term (current) drug therapy: Secondary | ICD-10-CM | POA: Diagnosis not present

## 2020-12-24 DIAGNOSIS — F17210 Nicotine dependence, cigarettes, uncomplicated: Secondary | ICD-10-CM | POA: Diagnosis not present

## 2020-12-24 DIAGNOSIS — Z880 Allergy status to penicillin: Secondary | ICD-10-CM | POA: Insufficient documentation

## 2020-12-24 DIAGNOSIS — Z96641 Presence of right artificial hip joint: Secondary | ICD-10-CM

## 2020-12-24 DIAGNOSIS — Z419 Encounter for procedure for purposes other than remedying health state, unspecified: Secondary | ICD-10-CM

## 2020-12-24 DIAGNOSIS — M87051 Idiopathic aseptic necrosis of right femur: Secondary | ICD-10-CM

## 2020-12-24 DIAGNOSIS — M879 Osteonecrosis, unspecified: Secondary | ICD-10-CM | POA: Insufficient documentation

## 2020-12-24 HISTORY — PX: TOTAL HIP ARTHROPLASTY: SHX124

## 2020-12-24 LAB — POCT I-STAT, CHEM 8
BUN: 4 mg/dL — ABNORMAL LOW (ref 6–20)
Calcium, Ion: 1.05 mmol/L — ABNORMAL LOW (ref 1.15–1.40)
Chloride: 94 mmol/L — ABNORMAL LOW (ref 98–111)
Creatinine, Ser: 0.7 mg/dL (ref 0.44–1.00)
Glucose, Bld: 139 mg/dL — ABNORMAL HIGH (ref 70–99)
HCT: 40 % (ref 36.0–46.0)
Hemoglobin: 13.6 g/dL (ref 12.0–15.0)
Potassium: 2.8 mmol/L — ABNORMAL LOW (ref 3.5–5.1)
Sodium: 141 mmol/L (ref 135–145)
TCO2: 35 mmol/L — ABNORMAL HIGH (ref 22–32)

## 2020-12-24 LAB — GLUCOSE, CAPILLARY
Glucose-Capillary: 213 mg/dL — ABNORMAL HIGH (ref 70–99)
Glucose-Capillary: 317 mg/dL — ABNORMAL HIGH (ref 70–99)
Glucose-Capillary: 298 mg/dL — ABNORMAL HIGH (ref 70–99)

## 2020-12-24 LAB — TYPE AND SCREEN
ABO/RH(D): O POS
Antibody Screen: NEGATIVE

## 2020-12-24 LAB — ABO/RH: ABO/RH(D): O POS

## 2020-12-24 IMAGING — RF DG HIP (WITH PELVIS) OPERATIVE*R*
1 series · 2 of 2 positions shown · non-contrast
Comparison: [DATE]

CLINICAL DATA: Right anterior hip arthroplasty.

EXAM:
OPERATIVE right HIP (WITH PELVIS IF PERFORMED) 1 VIEWS
TECHNIQUE: Fluoroscopic spot image(s) were submitted for interpretation
post-operatively.
Fluoroscopy time: 18 seconds
Dosage: 4.16 mGy

[Series 1: unknown protocol · 0.20mm/px · 2 of 2 slices shown]
[im 1/2]
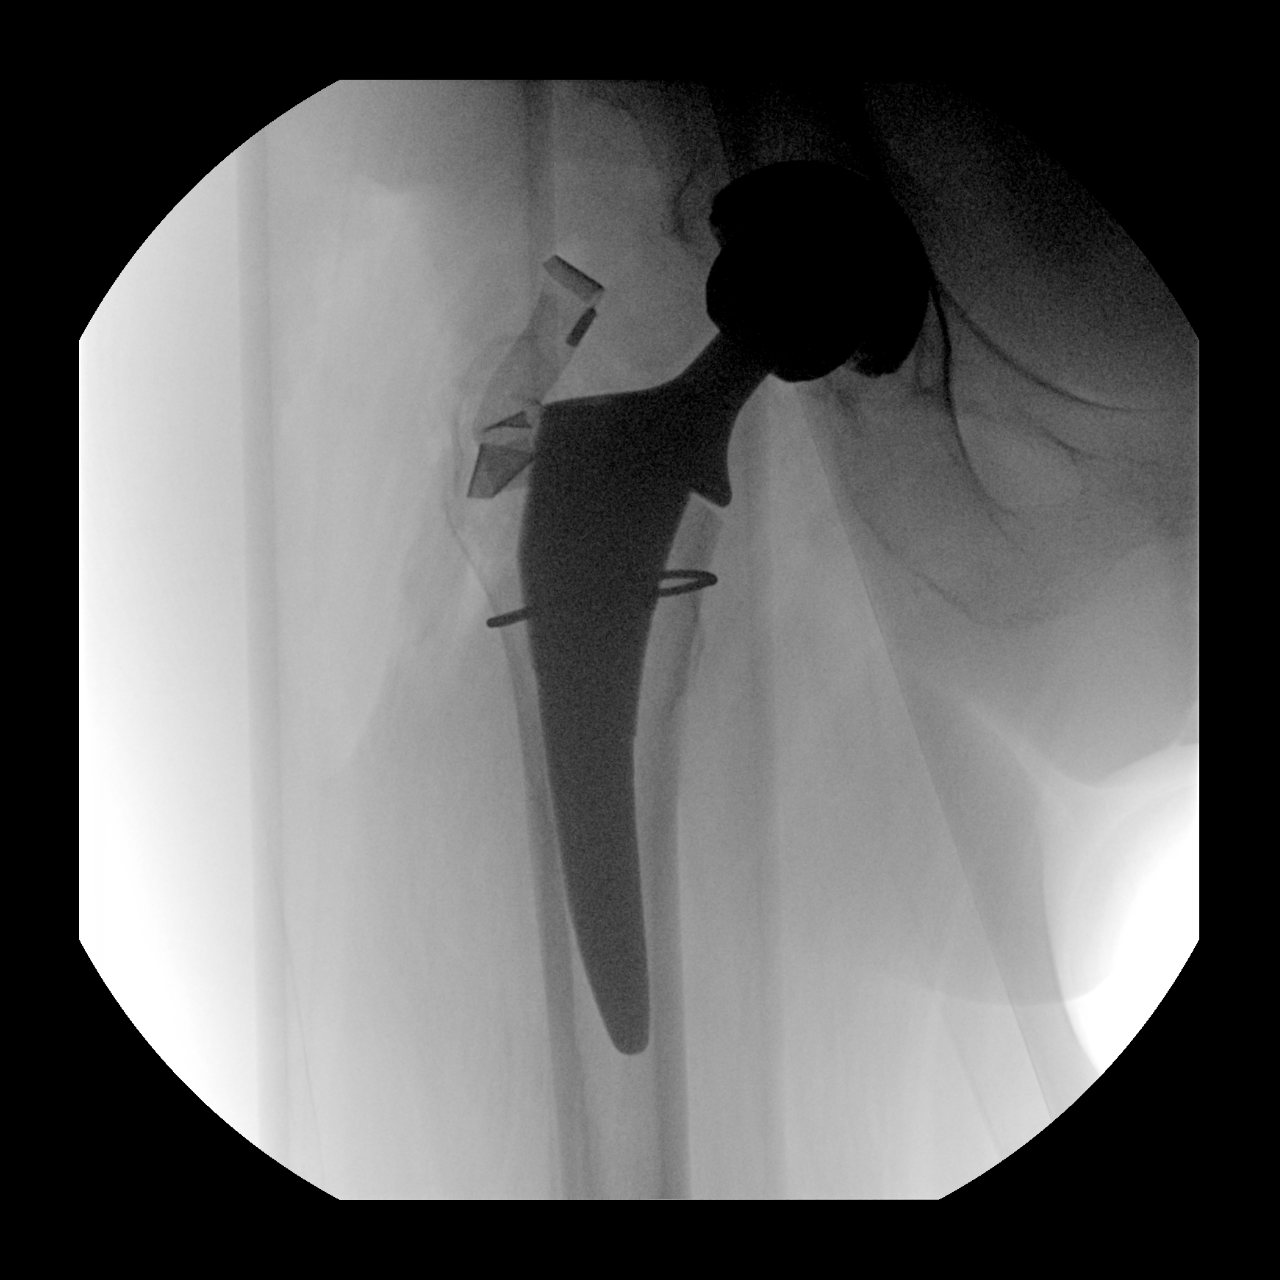
[im 2/2]
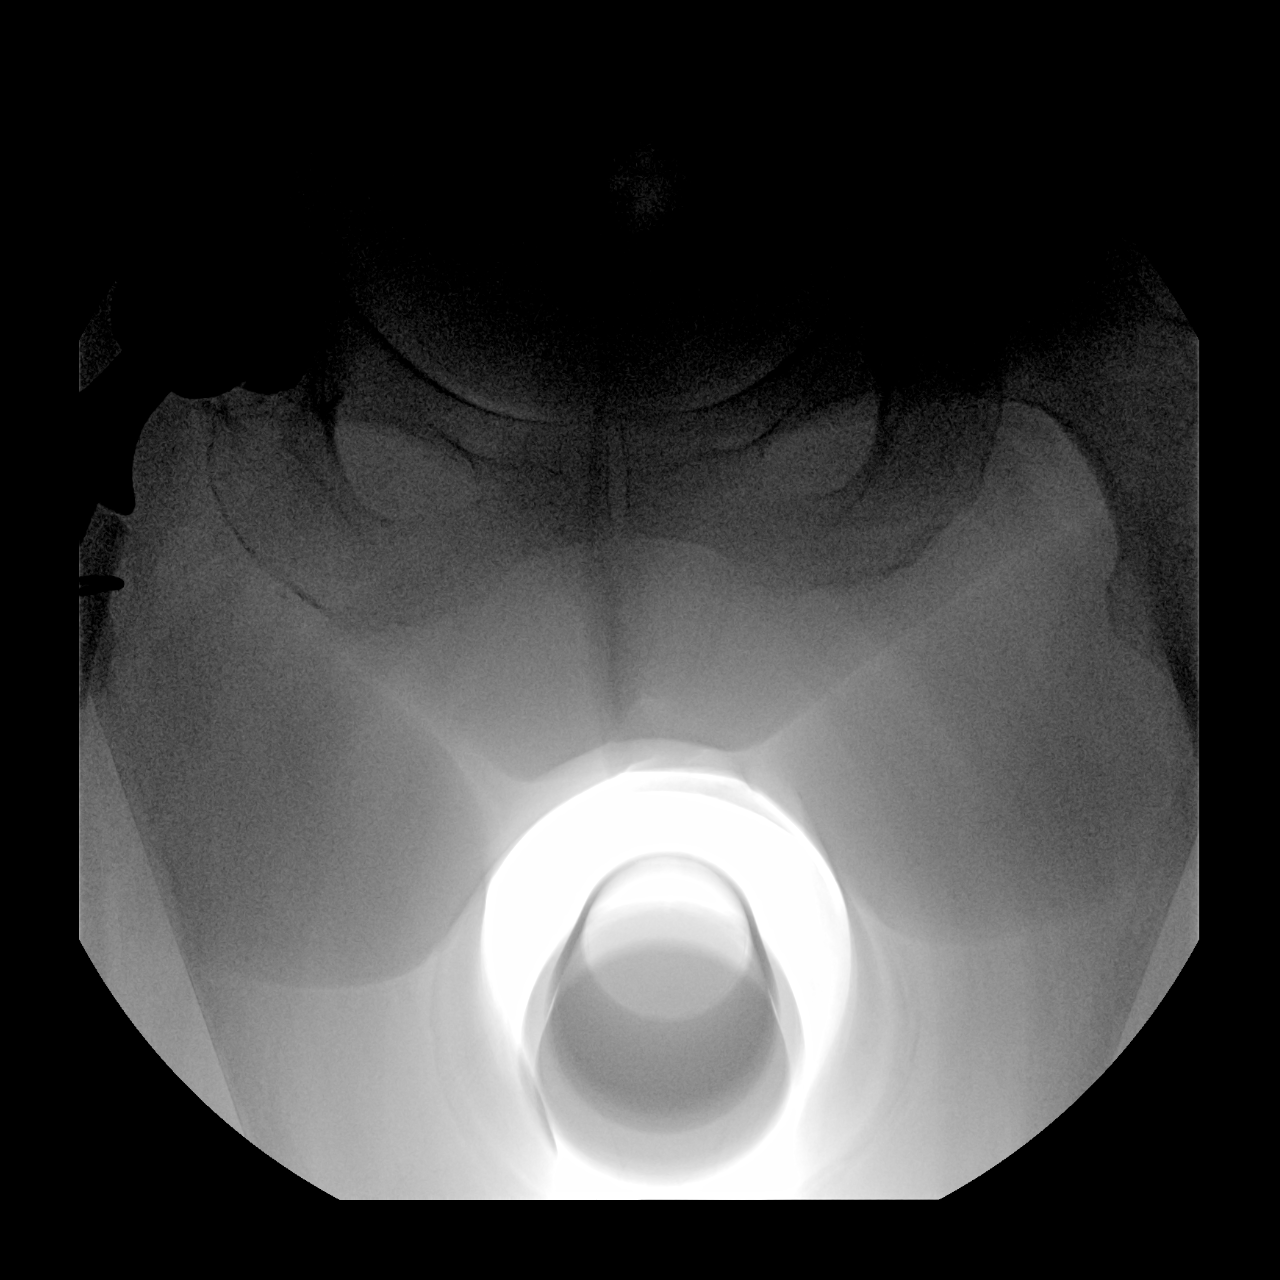

[2 of 2 positions shown; findings below may reference images not displayed]

FINDINGS: Postoperative change consistent with right total hip arthroplasty
identified. The hardware components are in anatomic alignment. No
periprosthetic fracture. There is a ribbon like foreign body
overlying the greater trochanter of the proximal right femur. This
conforms to the radiographic appearance of a laparotomy sponge
marker. Although this may be external to the patient clinical
correlation is advised to exclude retained surgical sponge.
IMPRESSION: 1. Status post right total hip arthroplasty. No periprosthetic
fracture or dislocation.
2. Radiopaque foreign body overlying the greater trochanter of the
proximal right femur conforms to the radiographic appearance of a
laparotomy sponge marker. Although this may be external to the
patient clinical correlation is recommended to exclude retained
surgical sponge.

These results will be called to the ordering clinician or
representative by the Radiologist Assistant, and communication
documented in the PACS or [REDACTED].

## 2020-12-24 IMAGING — RF DG C-ARM 1-60 MIN-NO REPORT
1 series · 2 of 2 positions shown · non-contrast
Comparison: [DATE]

CLINICAL DATA: Right anterior hip arthroplasty.

EXAM:
OPERATIVE right HIP (WITH PELVIS IF PERFORMED) 1 VIEWS
TECHNIQUE: Fluoroscopic spot image(s) were submitted for interpretation
post-operatively.
Fluoroscopy time: 18 seconds
Dosage: 4.16 mGy

[Series 1: unknown protocol · 0.20mm/px · 2 of 2 slices shown]
[im 1/2]
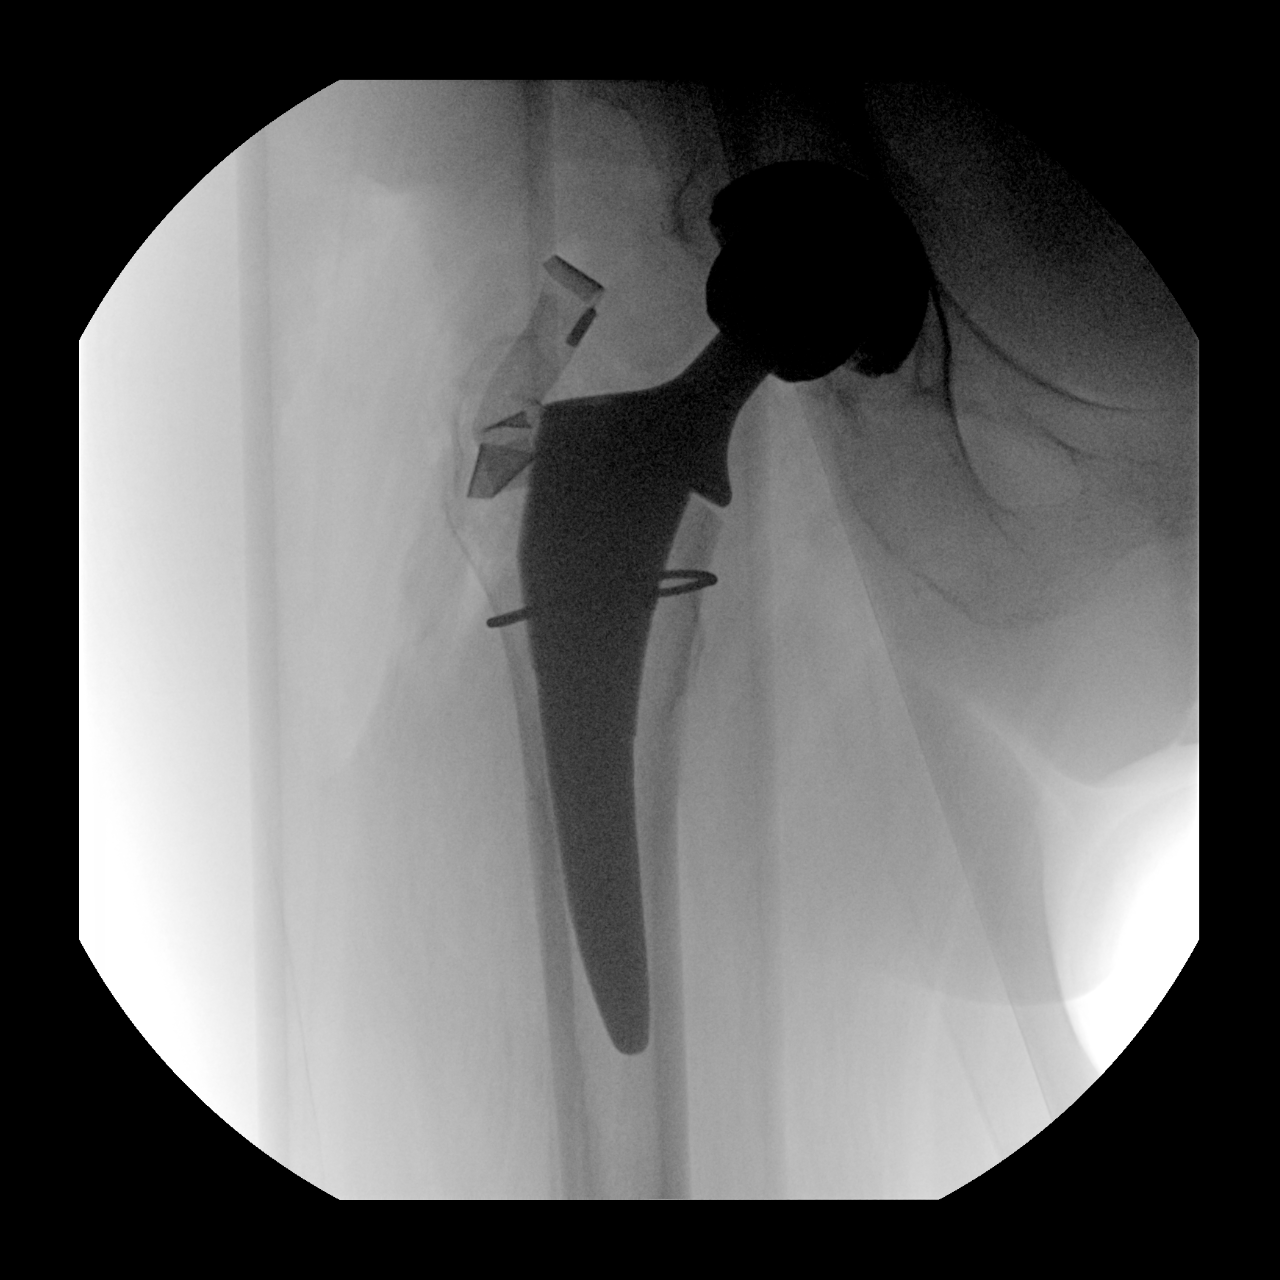
[im 2/2]
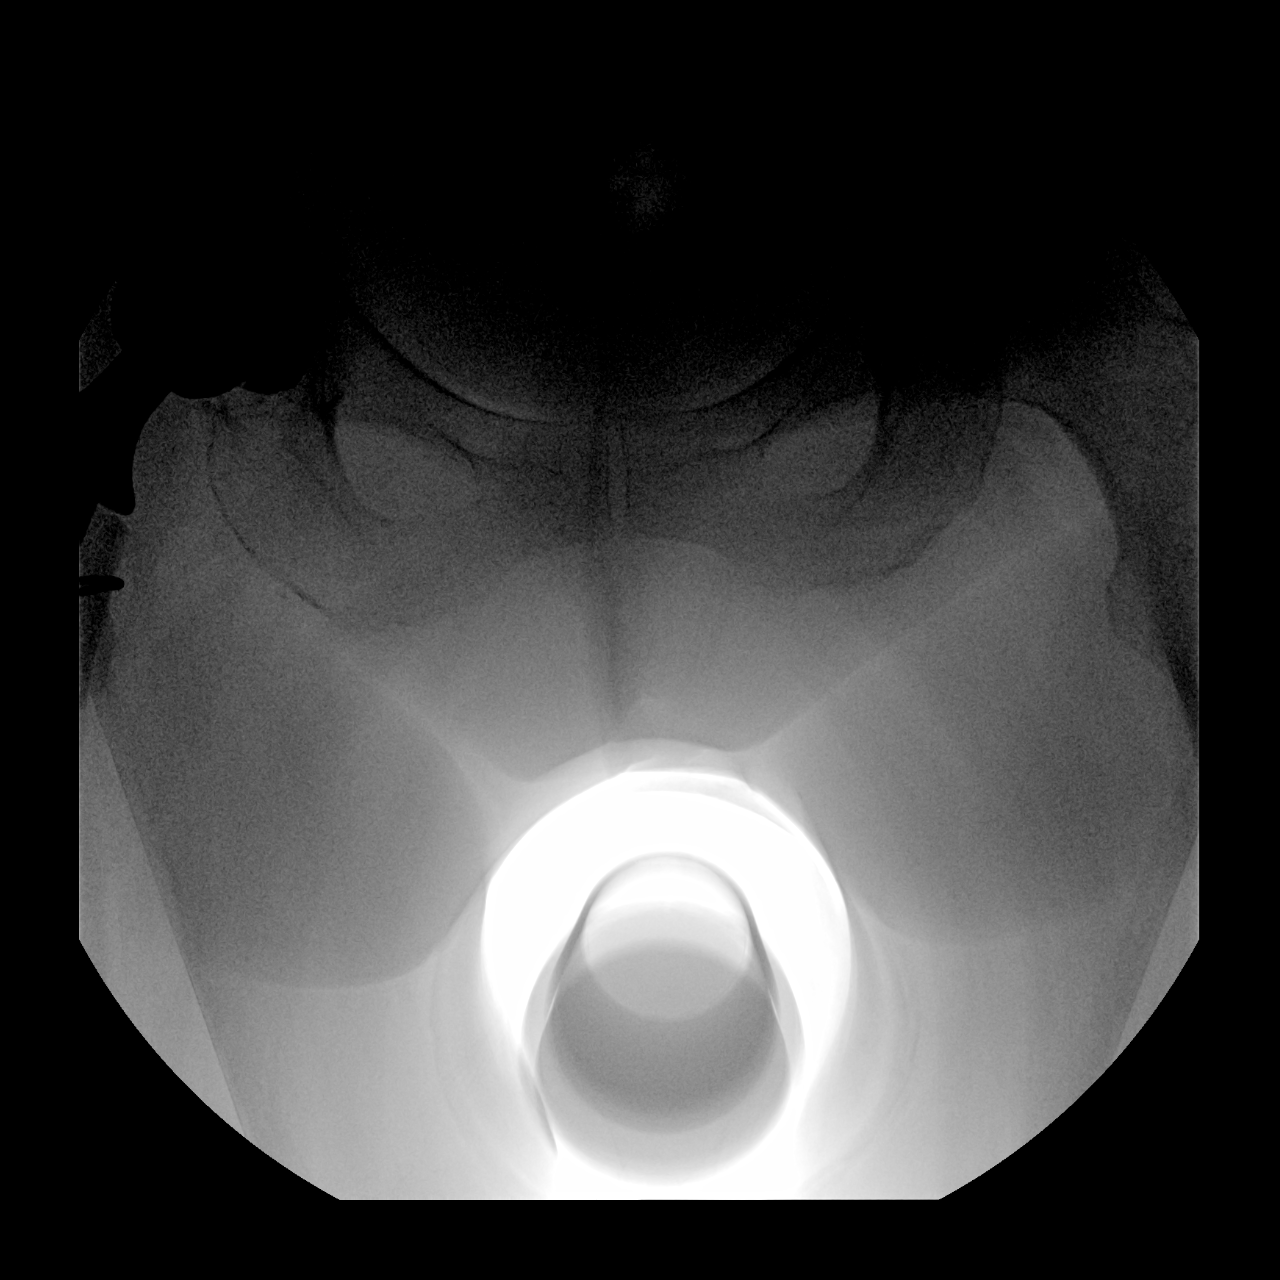

[2 of 2 positions shown; findings below may reference images not displayed]

FINDINGS: Postoperative change consistent with right total hip arthroplasty
identified. The hardware components are in anatomic alignment. No
periprosthetic fracture. There is a ribbon like foreign body
overlying the greater trochanter of the proximal right femur. This
conforms to the radiographic appearance of a laparotomy sponge
marker. Although this may be external to the patient clinical
correlation is advised to exclude retained surgical sponge.
IMPRESSION: 1. Status post right total hip arthroplasty. No periprosthetic
fracture or dislocation.
2. Radiopaque foreign body overlying the greater trochanter of the
proximal right femur conforms to the radiographic appearance of a
laparotomy sponge marker. Although this may be external to the
patient clinical correlation is recommended to exclude retained
surgical sponge.

These results will be called to the ordering clinician or
representative by the Radiologist Assistant, and communication
documented in the PACS or [REDACTED].

## 2020-12-24 SURGERY — ARTHROPLASTY, HIP, TOTAL, ANTERIOR APPROACH
Anesthesia: Spinal | Site: Hip | Laterality: Right

## 2020-12-24 MED ORDER — ORAL CARE MOUTH RINSE
15.0000 mL | Freq: Once | OROMUCOSAL | Status: AC
Start: 2020-12-24 — End: 2020-12-24

## 2020-12-24 MED ORDER — FENTANYL CITRATE (PF) 100 MCG/2ML IJ SOLN
INTRAMUSCULAR | Status: AC
  Filled 2020-12-24: qty 2

## 2020-12-24 MED ORDER — PROPOFOL 10 MG/ML IV BOLUS
INTRAVENOUS | Status: DC | PRN
  Administered 2020-12-24 (×2): 30 mg via INTRAVENOUS

## 2020-12-24 MED ORDER — TRANEXAMIC ACID-NACL 1000-0.7 MG/100ML-% IV SOLN
1000.0000 mg | Freq: Once | INTRAVENOUS | Status: AC
  Administered 2020-12-24: 1000 mg via INTRAVENOUS
  Filled 2020-12-24: qty 100

## 2020-12-24 MED ORDER — DOCUSATE SODIUM 100 MG PO CAPS
100.0000 mg | ORAL_CAPSULE | Freq: Two times a day (BID) | ORAL | Status: DC
  Administered 2020-12-24 – 2020-12-25 (×3): 100 mg via ORAL
  Filled 2020-12-24 (×3): qty 1

## 2020-12-24 MED ORDER — ACETAMINOPHEN 325 MG PO TABS: 325.0000 mg | ORAL_TABLET | Freq: Four times a day (QID) | ORAL | Status: DC | PRN

## 2020-12-24 MED ORDER — ASPIRIN 81 MG PO CHEW
81.0000 mg | CHEWABLE_TABLET | Freq: Two times a day (BID) | ORAL | Status: DC
  Administered 2020-12-24 – 2020-12-25 (×2): 81 mg via ORAL
  Filled 2020-12-24 (×2): qty 1

## 2020-12-24 MED ORDER — ONDANSETRON HCL 4 MG PO TABS: 4.0000 mg | ORAL_TABLET | Freq: Four times a day (QID) | ORAL | Status: DC | PRN

## 2020-12-24 MED ORDER — ALUM & MAG HYDROXIDE-SIMETH 200-200-20 MG/5ML PO SUSP: 30.0000 mL | ORAL | Status: DC | PRN

## 2020-12-24 MED ORDER — ONDANSETRON HCL 4 MG/2ML IJ SOLN
4.0000 mg | Freq: Four times a day (QID) | INTRAMUSCULAR | Status: DC | PRN
  Administered 2020-12-24: 4 mg via INTRAVENOUS
  Filled 2020-12-24: qty 2

## 2020-12-24 MED ORDER — PROPOFOL 10 MG/ML IV BOLUS
INTRAVENOUS | Status: AC
  Filled 2020-12-24: qty 20

## 2020-12-24 MED ORDER — BISACODYL 5 MG PO TBEC: 5.0000 mg | DELAYED_RELEASE_TABLET | Freq: Every day | ORAL | Status: DC | PRN

## 2020-12-24 MED ORDER — HYDROMORPHONE HCL 1 MG/ML IJ SOLN: 0.5000 mg | INTRAMUSCULAR | Status: DC | PRN

## 2020-12-24 MED ORDER — FLEET ENEMA 7-19 GM/118ML RE ENEM: 1.0000 | ENEMA | Freq: Once | RECTAL | Status: DC | PRN

## 2020-12-24 MED ORDER — KETOTIFEN FUMARATE 0.025 % OP SOLN
1.0000 [drp] | Freq: Every day | OPHTHALMIC | Status: DC | PRN
  Filled 2020-12-24: qty 5

## 2020-12-24 MED ORDER — METHOCARBAMOL 500 MG PO TABS
500.0000 mg | ORAL_TABLET | Freq: Four times a day (QID) | ORAL | Status: DC | PRN
  Administered 2020-12-24 – 2020-12-25 (×3): 500 mg via ORAL
  Filled 2020-12-24 (×3): qty 1

## 2020-12-24 MED ORDER — DEXAMETHASONE SODIUM PHOSPHATE 10 MG/ML IJ SOLN
10.0000 mg | Freq: Once | INTRAMUSCULAR | Status: AC
  Administered 2020-12-25: 10 mg via INTRAVENOUS
  Filled 2020-12-24: qty 1

## 2020-12-24 MED ORDER — DEXAMETHASONE SODIUM PHOSPHATE 10 MG/ML IJ SOLN
INTRAMUSCULAR | Status: DC | PRN
  Administered 2020-12-24: 5 mg via INTRAVENOUS

## 2020-12-24 MED ORDER — FOLIC ACID 1 MG PO TABS
1.0000 mg | ORAL_TABLET | Freq: Every day | ORAL | Status: DC
  Administered 2020-12-24 – 2020-12-25 (×2): 1 mg via ORAL
  Filled 2020-12-24 (×2): qty 1

## 2020-12-24 MED ORDER — GUAIFENESIN ER 600 MG PO TB12
600.0000 mg | ORAL_TABLET | Freq: Two times a day (BID) | ORAL | Status: DC
  Administered 2020-12-24 – 2020-12-25 (×2): 600 mg via ORAL
  Filled 2020-12-24 (×2): qty 1

## 2020-12-24 MED ORDER — CHLORHEXIDINE GLUCONATE 0.12 % MT SOLN
15.0000 mL | Freq: Once | OROMUCOSAL | Status: AC
Start: 2020-12-24 — End: 2020-12-24
  Administered 2020-12-24: 15 mL via OROMUCOSAL

## 2020-12-24 MED ORDER — PANTOPRAZOLE SODIUM 40 MG PO TBEC
40.0000 mg | DELAYED_RELEASE_TABLET | Freq: Two times a day (BID) | ORAL | Status: DC
  Administered 2020-12-24 – 2020-12-25 (×3): 40 mg via ORAL
  Filled 2020-12-24 (×3): qty 1

## 2020-12-24 MED ORDER — ONDANSETRON HCL 4 MG/2ML IJ SOLN: 4.0000 mg | Freq: Once | INTRAMUSCULAR | Status: DC | PRN

## 2020-12-24 MED ORDER — ASPIRIN EC 81 MG PO TBEC: 81.0000 mg | DELAYED_RELEASE_TABLET | Freq: Two times a day (BID) | ORAL | 0 refills | Status: AC

## 2020-12-24 MED ORDER — FLUTICASONE-UMECLIDIN-VILANT 100-62.5-25 MCG/INH IN AEPB: 1.0000 | INHALATION_SPRAY | Freq: Every day | RESPIRATORY_TRACT | Status: DC

## 2020-12-24 MED ORDER — LACTATED RINGERS IV SOLN: INTRAVENOUS | Status: DC

## 2020-12-24 MED ORDER — TRANEXAMIC ACID 1000 MG/10ML IV SOLN
INTRAVENOUS | Status: DC | PRN
  Administered 2020-12-24: 2000 mg via TOPICAL

## 2020-12-24 MED ORDER — PROPOFOL 500 MG/50ML IV EMUL
INTRAVENOUS | Status: DC | PRN
  Administered 2020-12-24: 75 ug/kg/min via INTRAVENOUS

## 2020-12-24 MED ORDER — FENTANYL CITRATE (PF) 100 MCG/2ML IJ SOLN
25.0000 ug | INTRAMUSCULAR | Status: DC | PRN
  Administered 2020-12-24: 50 ug via INTRAVENOUS

## 2020-12-24 MED ORDER — SERTRALINE HCL 100 MG PO TABS
100.0000 mg | ORAL_TABLET | Freq: Every day | ORAL | Status: DC
  Administered 2020-12-25: 100 mg via ORAL
  Filled 2020-12-24: qty 1

## 2020-12-24 MED ORDER — ACETAMINOPHEN 500 MG PO TABS
1000.0000 mg | ORAL_TABLET | Freq: Four times a day (QID) | ORAL | Status: AC
  Administered 2020-12-24 – 2020-12-25 (×4): 1000 mg via ORAL
  Filled 2020-12-24 (×4): qty 2

## 2020-12-24 MED ORDER — AMLODIPINE BESYLATE 10 MG PO TABS
10.0000 mg | ORAL_TABLET | Freq: Every day | ORAL | Status: DC
  Administered 2020-12-25: 10 mg via ORAL
  Filled 2020-12-24: qty 1

## 2020-12-24 MED ORDER — MIDAZOLAM HCL 2 MG/2ML IJ SOLN
INTRAMUSCULAR | Status: AC
  Filled 2020-12-24: qty 2

## 2020-12-24 MED ORDER — 0.9 % SODIUM CHLORIDE (POUR BTL) OPTIME
TOPICAL | Status: DC | PRN
  Administered 2020-12-24: 1000 mL

## 2020-12-24 MED ORDER — METHOCARBAMOL 1000 MG/10ML IJ SOLN
500.0000 mg | Freq: Four times a day (QID) | INTRAVENOUS | Status: DC | PRN
  Filled 2020-12-24: qty 5

## 2020-12-24 MED ORDER — ONDANSETRON HCL 4 MG/2ML IJ SOLN
INTRAMUSCULAR | Status: DC | PRN
  Administered 2020-12-24: 4 mg via INTRAVENOUS

## 2020-12-24 MED ORDER — ONDANSETRON HCL 4 MG/2ML IJ SOLN
INTRAMUSCULAR | Status: AC
  Filled 2020-12-24: qty 2

## 2020-12-24 MED ORDER — METOCLOPRAMIDE HCL 5 MG/ML IJ SOLN: 5.0000 mg | Freq: Three times a day (TID) | INTRAMUSCULAR | Status: DC | PRN

## 2020-12-24 MED ORDER — SODIUM CHLORIDE (PF) 0.9 % IJ SOLN
INTRAMUSCULAR | Status: DC | PRN
  Administered 2020-12-24: 50 mL

## 2020-12-24 MED ORDER — PHENOL 1.4 % MT LIQD: 1.0000 | OROMUCOSAL | Status: DC | PRN

## 2020-12-24 MED ORDER — FLUTICASONE FUROATE-VILANTEROL 100-25 MCG/INH IN AEPB
1.0000 | INHALATION_SPRAY | Freq: Every day | RESPIRATORY_TRACT | Status: DC
  Administered 2020-12-25: 1 via RESPIRATORY_TRACT
  Filled 2020-12-24: qty 28

## 2020-12-24 MED ORDER — BUPIVACAINE IN DEXTROSE 0.75-8.25 % IT SOLN
INTRATHECAL | Status: DC | PRN
  Administered 2020-12-24: 1.6 mL via INTRATHECAL

## 2020-12-24 MED ORDER — OXYCODONE-ACETAMINOPHEN 5-325 MG PO TABS: 1.0000 | ORAL_TABLET | ORAL | 0 refills | Status: DC | PRN

## 2020-12-24 MED ORDER — OXYCODONE HCL 5 MG PO TABS
5.0000 mg | ORAL_TABLET | Freq: Once | ORAL | Status: DC | PRN
Start: 2020-12-24 — End: 2020-12-24

## 2020-12-24 MED ORDER — POVIDONE-IODINE 10 % EX SWAB
2.0000 "application " | Freq: Once | CUTANEOUS | Status: AC
  Administered 2020-12-24: 2 via TOPICAL

## 2020-12-24 MED ORDER — METOCLOPRAMIDE HCL 5 MG PO TABS: 5.0000 mg | ORAL_TABLET | Freq: Three times a day (TID) | ORAL | Status: DC | PRN

## 2020-12-24 MED ORDER — QUETIAPINE FUMARATE 50 MG PO TABS
200.0000 mg | ORAL_TABLET | Freq: Every day | ORAL | Status: DC
  Administered 2020-12-24: 200 mg via ORAL
  Filled 2020-12-24: qty 4

## 2020-12-24 MED ORDER — UMECLIDINIUM BROMIDE 62.5 MCG/INH IN AEPB
1.0000 | INHALATION_SPRAY | Freq: Every day | RESPIRATORY_TRACT | Status: DC
  Administered 2020-12-25: 1 via RESPIRATORY_TRACT
  Filled 2020-12-24: qty 7

## 2020-12-24 MED ORDER — INSULIN ASPART 100 UNIT/ML IJ SOLN
0.0000 [IU] | Freq: Three times a day (TID) | INTRAMUSCULAR | Status: DC
  Administered 2020-12-24: 11 [IU] via SUBCUTANEOUS
  Administered 2020-12-25: 3 [IU] via SUBCUTANEOUS
  Administered 2020-12-25: 5 [IU] via SUBCUTANEOUS

## 2020-12-24 MED ORDER — LACTATED RINGERS IV BOLUS: 250.0000 mL | Freq: Once | INTRAVENOUS | Status: DC

## 2020-12-24 MED ORDER — FENTANYL CITRATE (PF) 100 MCG/2ML IJ SOLN
INTRAMUSCULAR | Status: DC | PRN
  Administered 2020-12-24 (×4): 25 ug via INTRAVENOUS
  Administered 2020-12-24: 100 ug via INTRAVENOUS

## 2020-12-24 MED ORDER — TIZANIDINE HCL 2 MG PO TABS: 2.0000 mg | ORAL_TABLET | Freq: Four times a day (QID) | ORAL | 0 refills | Status: DC | PRN

## 2020-12-24 MED ORDER — PROPOFOL 500 MG/50ML IV EMUL
INTRAVENOUS | Status: AC
  Filled 2020-12-24: qty 50

## 2020-12-24 MED ORDER — OXYCODONE HCL 5 MG PO TABS
10.0000 mg | ORAL_TABLET | ORAL | Status: DC | PRN
  Administered 2020-12-24 (×2): 10 mg via ORAL
  Administered 2020-12-25 (×3): 15 mg via ORAL
  Filled 2020-12-24: qty 3
  Filled 2020-12-24: qty 2
  Filled 2020-12-24 (×2): qty 3

## 2020-12-24 MED ORDER — PANTOPRAZOLE SODIUM 40 MG PO TBEC: 40.0000 mg | DELAYED_RELEASE_TABLET | Freq: Every day | ORAL | Status: DC

## 2020-12-24 MED ORDER — BUPIVACAINE LIPOSOME 1.3 % IJ SUSP
INTRAMUSCULAR | Status: DC | PRN
  Administered 2020-12-24: 10 mL

## 2020-12-24 MED ORDER — LACTATED RINGERS IV BOLUS: 500.0000 mL | Freq: Once | INTRAVENOUS | Status: DC

## 2020-12-24 MED ORDER — TRANEXAMIC ACID-NACL 1000-0.7 MG/100ML-% IV SOLN
1000.0000 mg | INTRAVENOUS | Status: AC
  Administered 2020-12-24: 1000 mg via INTRAVENOUS
  Filled 2020-12-24: qty 100

## 2020-12-24 MED ORDER — OXYBUTYNIN CHLORIDE ER 5 MG PO TB24
10.0000 mg | ORAL_TABLET | Freq: Every day | ORAL | Status: DC
  Administered 2020-12-24: 10 mg via ORAL
  Filled 2020-12-24: qty 2

## 2020-12-24 MED ORDER — OXYCODONE HCL 5 MG/5ML PO SOLN: 5.0000 mg | Freq: Once | ORAL | Status: DC | PRN

## 2020-12-24 MED ORDER — MENTHOL 3 MG MT LOZG: 1.0000 | LOZENGE | OROMUCOSAL | Status: DC | PRN

## 2020-12-24 MED ORDER — CEFAZOLIN SODIUM-DEXTROSE 2-4 GM/100ML-% IV SOLN
2.0000 g | INTRAVENOUS | Status: AC
  Administered 2020-12-24: 2 g via INTRAVENOUS
  Filled 2020-12-24: qty 100

## 2020-12-24 MED ORDER — POTASSIUM CHLORIDE 10 MEQ/100ML IV SOLN
10.0000 meq | INTRAVENOUS | Status: AC
  Administered 2020-12-24 (×3): 10 meq via INTRAVENOUS
  Filled 2020-12-24 (×3): qty 100

## 2020-12-24 MED ORDER — BUPIVACAINE-EPINEPHRINE (PF) 0.25% -1:200000 IJ SOLN
INTRAMUSCULAR | Status: AC
  Filled 2020-12-24: qty 30

## 2020-12-24 MED ORDER — MIDAZOLAM HCL 5 MG/5ML IJ SOLN
INTRAMUSCULAR | Status: DC | PRN
  Administered 2020-12-24: 2 mg via INTRAVENOUS

## 2020-12-24 MED ORDER — DEXAMETHASONE SODIUM PHOSPHATE 10 MG/ML IJ SOLN
INTRAMUSCULAR | Status: AC
  Filled 2020-12-24: qty 1

## 2020-12-24 MED ORDER — HYDROXYZINE HCL 25 MG PO TABS: 50.0000 mg | ORAL_TABLET | Freq: Three times a day (TID) | ORAL | Status: DC | PRN

## 2020-12-24 MED ORDER — PROPOFOL 1000 MG/100ML IV EMUL
INTRAVENOUS | Status: AC
  Filled 2020-12-24: qty 100

## 2020-12-24 MED ORDER — STERILE WATER FOR IRRIGATION IR SOLN
Status: DC | PRN
  Administered 2020-12-24: 2000 mL

## 2020-12-24 MED ORDER — DIPHENHYDRAMINE HCL 12.5 MG/5ML PO ELIX: 12.5000 mg | ORAL_SOLUTION | ORAL | Status: DC | PRN

## 2020-12-24 MED ORDER — POLYETHYLENE GLYCOL 3350 17 G PO PACK: 17.0000 g | PACK | Freq: Every day | ORAL | Status: DC | PRN

## 2020-12-24 MED ORDER — OXYCODONE HCL 5 MG PO TABS
5.0000 mg | ORAL_TABLET | ORAL | Status: DC | PRN
  Administered 2020-12-24: 5 mg via ORAL
  Filled 2020-12-24: qty 2
  Filled 2020-12-24: qty 1

## 2020-12-24 MED ORDER — KCL IN DEXTROSE-NACL 20-5-0.45 MEQ/L-%-% IV SOLN
INTRAVENOUS | Status: DC
  Filled 2020-12-24 (×5): qty 1000

## 2020-12-24 MED ORDER — BUPIVACAINE-EPINEPHRINE 0.25% -1:200000 IJ SOLN
INTRAMUSCULAR | Status: DC | PRN
  Administered 2020-12-24: 30 mL

## 2020-12-24 SURGICAL SUPPLY — 43 items
BAG DECANTER FOR FLEXI CONT (MISCELLANEOUS) ×4 IMPLANT
BALL HIP CERAMIC (Hips) ×1 IMPLANT
BLADE SAW SGTL 18X1.27X75 (BLADE) ×2 IMPLANT
CABLE CERLAGE W/CRIMP 1.8 (Cable) ×2 IMPLANT
COVER PERINEAL POST (MISCELLANEOUS) ×2 IMPLANT
COVER SURGICAL LIGHT HANDLE (MISCELLANEOUS) ×2 IMPLANT
COVER WAND RF STERILE (DRAPES) ×2 IMPLANT
CUP ACETBLR 48 OD 100 SERIES (Hips) ×2 IMPLANT
DECANTER SPIKE VIAL GLASS SM (MISCELLANEOUS) ×2 IMPLANT
DRAPE STERI IOBAN 125X83 (DRAPES) ×2 IMPLANT
DRAPE U-SHAPE 47X51 STRL (DRAPES) ×4 IMPLANT
DRSG AQUACEL AG ADV 3.5X10 (GAUZE/BANDAGES/DRESSINGS) ×2 IMPLANT
DURAPREP 26ML APPLICATOR (WOUND CARE) ×2 IMPLANT
ELECT BLADE TIP CTD 4 INCH (ELECTRODE) ×2 IMPLANT
ELECT REM PT RETURN 15FT ADLT (MISCELLANEOUS) ×2 IMPLANT
ELIMINATOR HOLE APEX DEPUY (Hips) ×2 IMPLANT
GLOVE SRG 8 PF TXTR STRL LF DI (GLOVE) ×1 IMPLANT
GLOVE SURG ENC MOIS LTX SZ7.5 (GLOVE) ×2 IMPLANT
GLOVE SURG ENC MOIS LTX SZ8.5 (GLOVE) ×2 IMPLANT
GLOVE SURG UNDER POLY LF SZ8 (GLOVE) ×1
GLOVE SURG UNDER POLY LF SZ9 (GLOVE) ×2 IMPLANT
GOWN STRL REUS W/TWL XL LVL3 (GOWN DISPOSABLE) ×4 IMPLANT
HIP BALL CERAMIC (Hips) ×2 IMPLANT
HOLDER FOLEY CATH W/STRAP (MISCELLANEOUS) IMPLANT
KIT TURNOVER KIT A (KITS) ×2 IMPLANT
MANIFOLD NEPTUNE II (INSTRUMENTS) ×2 IMPLANT
NEEDLE HYPO 21X1.5 SAFETY (NEEDLE) ×6 IMPLANT
NS IRRIG 1000ML POUR BTL (IV SOLUTION) ×2 IMPLANT
PACK ANTERIOR HIP CUSTOM (KITS) ×2 IMPLANT
PINN ALTRX NEUT ID X OD 32X48 ×2 IMPLANT
STEM FEM ACTIS HIGH SZ7 (Stem) ×2 IMPLANT
SUT ETHIBOND NAB CT1 #1 30IN (SUTURE) ×2 IMPLANT
SUT VIC AB 0 CT1 27 (SUTURE)
SUT VIC AB 0 CT1 27XBRD ANBCTR (SUTURE) IMPLANT
SUT VIC AB 1 CTX 36 (SUTURE) ×1
SUT VIC AB 1 CTX36XBRD ANBCTR (SUTURE) ×1 IMPLANT
SUT VIC AB 2-0 CT1 27 (SUTURE) ×1
SUT VIC AB 2-0 CT1 TAPERPNT 27 (SUTURE) ×1 IMPLANT
SUT VIC AB 3-0 CT1 27 (SUTURE) ×1
SUT VIC AB 3-0 CT1 TAPERPNT 27 (SUTURE) ×1 IMPLANT
SYR CONTROL 10ML LL (SYRINGE) ×6 IMPLANT
TRAY CATH INTERMITTENT SS 16FR (CATHETERS) ×2 IMPLANT
TUBE SUCTION HIGH CAP CLEAR NV (SUCTIONS) ×2 IMPLANT

## 2020-12-24 NOTE — Op Note (Signed)
PATIENT ID:      [name]  MRN:     [id number] DOB/AGE:    03-23-1961 / 60 y.o.  OPERATIVE REPORT   DATE OF PROCEDURE:  12/24/2020      PREOPERATIVE DIAGNOSIS:  RIGHT HIP AVASCULAR NECROSIS, collapse of femoral head with delamination of cartilage                                                        POSTOPERATIVE DIAGNOSIS:  Same                                                         PROCEDURE: Anterior right  total hip arthroplasty using a 48 mm DePuy Pinnacle  Cup, [employer], 0-degree polyethylene liner, a + 5 mm x 32 mm ceramic head, a 7 high offset Depuy Actis stem, 1 to millimeter Zimmer intertrochanteric prophylactic cable  SURGEON: Nestor Lewandowsky  ASSISTANT:   Tomi Likens. [employer]  (present throughout entire procedure and necessary for timely completion of the procedure)   ANESTHESIA: Spinal, Exparel 133mg  injection BLOOD LOSS: 400 cc FLUID REPLACEMENT: 1800 cc crystalloid TRANEXAMIC ACID: 1gm IV, 2gm Topical COMPLICATIONS: none   INDICATIONS FOR PROCEDURE: A 60 y.o. year-old With  RIGHT HIP AVASCULAR NECROSIS for the last year now with collapse of the femoral head on x-ray and by MRI scan.  The patient's pain interferes greatly with any attempts at ambulation she must use a walker it wakes her at night and she has fallen. Patient desires elective right total hip arthroplasty to decrease pain and increase function. The risks, benefits, and alternatives were discussed at length including but not limited to the risks of infection, bleeding, nerve injury, stiffness, blood clots, the need for revision surgery, cardiopulmonary complications, among others, and they were willing to proceed. Questions answered      PROCEDURE IN DETAIL: The patient was identified by armband,   received preoperative IV antibiotics in the holding area at [facility], taken to the operating room , appropriate anesthetic monitors   were attached and anesthesia was induced with the  patient on the gurney. HANA boots were applied to the feet, and the patient  was transferred to the HANA table with a peroneal post and support underneath the non-operative leg. Theoperative lower extremity was then prepped and draped in the usual sterile fashion from just above the iliac crest to the knee. And a timeout procedure was performed. [facility]. Tomi Likens [facility] was present and scrubbed throughout the case, critical for assistance with, positioning, exposure, retraction, instrumentation, and closure.Skin along incision area was injected with 10 cc of Exparel solution. We then made a 15 cm incision along the interval at the leading edge of the tensor fascia lata of starting at 2 cm lateral to the ASIS. Small bleeders in the skin and subcutaneous tissue identified and cauterized we dissected down to the fascia and made an incision in the fascia allowing [facility] to elevate the fascia of the tensor muscle and exploited the interval between the rectus and the tensor fascia lata. A Cobra retractor was then placed along the superior neck of  the femur. A cerebellar retractor was used to expose the interval between the tensor fascia lata and the rectus femoris.  We identified and cauterized the ascending branch of the anterior circumflex artery. A second Cobra retractor along the inferior neck of the femur. A small Hohmann retractor was placed underneath the origin of the rectus femoris, giving [place] good medial exposure. Using Ronguers fatty tissue was removed from in front of the anterior capsule. The capsule was then incised, starting out at the superior anterior rim of the acetabulum going laterally along the anterior neck. The capsule was then teed along the neck superiorly and inferiorly. Electrocautery was used to release capsule from the anterior and medial neck of the femur to allow external rotation. Cobra retractors were then placed along the inferior and superior neck allowing [place] to perform a standard neck cut and  removed the femoral head with a power corkscrew.  Femoral head measured 42 mm. We then placed a medium bent homan retractor in the cotyloid notch and posteriorly along the acetabular rim a narrow Cobra retractor. Exposed labral tissue and osteophytes were then removed. We then sequentially reamed up to a 47 mm basket reamer obtaining good coverage in all quadrants, verified by C-arm imaging. Under C-arm control we then hammered into place a 48 mm Pinnacle cup in 45 of abduction and 15 of anteversion. The cup seated nicely and required no supplemental screws. We then placed a central hole Eliminator and a 0 polyethylene liner. The foot was then externally rotated to 140 degrees. The limb was extended and adducted to the floor, delivering the proximal femur up into the wound. A medium curved Hohmann retractor was placed over the greater trochanter and a long Homan retractor along the posterior femoral neck completing the exposure and lateralizing the femur. We then performed releases superiorly and and inferiorly of the capsule going back to the pirformis fossa superiorly and to the lesser trochanter inferiorly. We then entered the proximal femur with the box cutting offset chisel followed by, a canal sounder, the chili pepper and broaching up to a 7 broach. This seated nicely and we reamed the calcar. A trial reduction was performed with a +5 mm X 32 mm head.The limb lengths were excellent the hip was stable in 90 of external rotation. At this point the trial components removed and we hammered into place a #7 high offset Actis stem with Gryption coating. A + 5 mm x 32 head was then hammered into place. The hip was reduced and final C-arm images obtained. The wound was thoroughly irrigated with normal saline solution. We repaired the ant capsule and the tensor fascia lot a with running 0 vicryl suture. the subcutaneous tissue was closed with 3-0 Vicryl suture followed by an Aquacil dressing. At this point the  patient was awaken and transferred to hospital gurney without difficulty.   Nestor Lewandowsky 12/24/2020, 7:08 AM

## 2020-12-24 NOTE — Anesthesia Procedure Notes (Signed)
Procedure Name: MAC Date/Time: 12/24/2020 9:36 AM Performed by: West Pugh, CRNA Pre-anesthesia Checklist: Patient identified, Emergency Drugs available, Suction available, Patient being monitored and Timeout performed Patient Re-evaluated:Patient Re-evaluated prior to induction Oxygen Delivery Method: Simple face mask Preoxygenation: Pre-oxygenation with 100% oxygen Induction Type: IV induction Placement Confirmation: positive ETCO2

## 2020-12-24 NOTE — Progress Notes (Signed)
Dannielle Burn PA made aware of report called from [facility] re: intraoperative xray.

## 2020-12-24 NOTE — Interval H&P Note (Signed)
History and Physical Interval Note:  12/24/2020 7:07 AM  [name]  has presented today for surgery, with the diagnosis of RIGHT HIP AVASCULAR NECROSIS.  The various methods of treatment have been discussed with the patient and family. After consideration of risks, benefits and other options for treatment, the patient has consented to  Procedure(s): RIGHT TOTAL HIP ARTHROPLASTY ANTERIOR APPROACH (Right) as a surgical intervention.  The patient's history has been reviewed, patient examined, no change in status, stable for surgery.  I have reviewed the patient's chart and labs.  Questions were answered to the patient's satisfaction.     Nestor Lewandowsky

## 2020-12-24 NOTE — Transfer of Care (Signed)
Immediate Anesthesia Transfer of Care Note  Patient: [name]  Procedure(s) Performed: RIGHT TOTAL HIP ARTHROPLASTY ANTERIOR APPROACH (Right Hip)  Patient Location: PACU  Anesthesia Type:MAC and Spinal  Level of Consciousness: awake, alert  and patient cooperative  Airway & Oxygen Therapy: Patient Spontanous Breathing and Patient connected to face mask oxygen  Post-op Assessment: Report given to RN and Post -op Vital signs reviewed and stable  Post vital signs: Reviewed and stable  Last Vitals:  Vitals Value Taken Time  BP 127/94 12/24/20 1205  Temp    Pulse 95 12/24/20 1208  Resp 18 12/24/20 1208  SpO2 96 % 12/24/20 1208  Vitals shown include unvalidated device data.  Last Pain:  Vitals:   12/24/20 0733  TempSrc:   PainSc: 0-No pain         Complications: No complications documented.

## 2020-12-24 NOTE — Anesthesia Preprocedure Evaluation (Addendum)
Anesthesia Evaluation  Patient identified by MRN, date of birth, ID band Patient awake    Reviewed: Allergy & Precautions, NPO status , Patient's Chart, lab work & pertinent test results  History of Anesthesia Complications Negative for: history of anesthetic complications  Airway Mallampati: II  TM Distance: >3 FB Neck ROM: Full    Dental  (+) Dental Advisory Given, Upper Dentures, Lower Dentures   Pulmonary COPD,  COPD inhaler, Current Smoker and Patient abstained from smoking.,    Pulmonary exam normal        Cardiovascular hypertension, Normal cardiovascular exam   '21 TTE - EF 60 to 65%. Grade I diastolic dysfunction (impaired relaxation). Mildly elevated pulmonary artery systolic pressure. The estimated right ventricular systolic pressure is 34.9 mmHg. Left atrial size was mildly dilated. Trivial MR, TR, and PR.    Neuro/Psych PSYCHIATRIC DISORDERS Anxiety Depression negative neurological ROS     GI/Hepatic GERD  Medicated,(+) Cirrhosis     substance abuse  alcohol use,   Endo/Other  diabetes Obesity   Renal/GU negative Renal ROS     Musculoskeletal  (+) Arthritis ,   Abdominal   Peds  Hematology negative hematology ROS (+)   Anesthesia Other Findings Covid test negative   Reproductive/Obstetrics                           Anesthesia Physical Anesthesia Plan  ASA: III  Anesthesia Plan: Spinal   Post-op Pain Management:    Induction:   PONV Risk Score and Plan: 2 and Treatment may vary due to age or medical condition and Propofol infusion  Airway Management Planned: Natural Airway and Simple Face Mask  Additional Equipment: None  Intra-op Plan:   Post-operative Plan:   Informed Consent: I have reviewed the patients History and Physical, chart, labs and discussed the procedure including the risks, benefits and alternatives for the proposed anesthesia with the patient  or authorized representative who has indicated his/her understanding and acceptance.       Plan Discussed with: CRNA and Anesthesiologist  Anesthesia Plan Comments: ( )       Anesthesia Quick Evaluation

## 2020-12-24 NOTE — Anesthesia Procedure Notes (Signed)
Spinal  Patient location during procedure: OR Start time: 12/24/2020 9:44 AM End time: 12/24/2020 9:46 AM Staffing Performed: anesthesiologist  Anesthesiologist: Atilano Median, DO Preanesthetic Checklist Completed: patient identified, IV checked, site marked, risks and benefits discussed, surgical consent, monitors and equipment checked, pre-op evaluation and timeout performed Spinal Block Patient position: sitting Prep: DuraPrep Patient monitoring: heart rate, cardiac monitor, continuous pulse ox and blood pressure Approach: midline Location: L4-5 Injection technique: single-shot Needle Needle type: Pencan  Needle gauge: 24 G Needle length: 10 cm Additional Notes Patient identified. Risks/Benefits/Options discussed with patient including but not limited to bleeding, infection, nerve damage, paralysis, failed block, incomplete pain control, headache, blood pressure changes, nausea, vomiting, reactions to medications, itching and postpartum back pain. Confirmed with bedside nurse the patient's most recent platelet count. Confirmed with patient that they are not currently taking any anticoagulation, have any bleeding history or any family history of bleeding disorders. Patient expressed understanding and wished to proceed. All questions were answered. Sterile technique was used throughout the entire procedure. Please see nursing notes for vital signs. Warning signs of high block given to the patient including shortness of breath, tingling/numbness in hands, complete motor block, or any concerning symptoms with instructions to call for help. Patient was given instructions on fall risk and not to get out of bed. All questions and concerns addressed with instructions to call with any issues or inadequate analgesia.

## 2020-12-24 NOTE — Evaluation (Signed)
Physical Therapy Evaluation Patient Details Name: [name] MRN: [id number] DOB: 05-03-1961 Today's Date: 12/24/2020   History of Present Illness  s/p R DA THA. PMH: GAD, cirrhosis, hx ETOH, GIB, COPD, HTN  Clinical Impression  Pt is s/p THA resulting in the deficits listed below (see PT Problem List).  Pt with c/o pain therefore amb limited ~ 5' bed to chair. Pt reports decr pain after mobilizing.   Pt will benefit from skilled PT to increase their independence and safety with mobility to allow discharge to the venue listed below.      Follow Up Recommendations Follow surgeon's recommendation for DC plan and follow-up therapies    Equipment Recommendations  None recommended by PT    Recommendations for Other Services       Precautions / Restrictions Precautions Precautions: Fall Restrictions Weight Bearing Restrictions: No Other Position/Activity Restrictions: WBAT      Mobility  Bed Mobility Overal bed mobility: Needs Assistance Bed Mobility: Supine to Sit     Supine to sit: Min guard     General bed mobility comments: incr time and effort, min/guard for safety    Transfers Overall transfer level: Needs assistance Equipment used: Rolling walker (2 wheeled) Transfers: Sit to/from [employer] Sit to Stand: Min assist Stand pivot transfers: Min assist       General transfer comment: cues for hand placement  Ambulation/Gait Ambulation/Gait assistance: Min assist Gait Distance (Feet): 5 Feet Assistive device: Rolling walker (2 wheeled) Gait Pattern/deviations: Step-to pattern;Wide base of support     General Gait Details: cues for RW position, limited by pain  Stairs            Wheelchair Mobility    Modified Rankin (Stroke Patients Only)       Balance                                             Pertinent Vitals/Pain Pain Assessment: 0-10 Pain Score: 10-Worst pain ever Pain Location: R hip Pain  Descriptors / Indicators: Grimacing Pain Intervention(s): Limited activity within patient's tolerance;Monitored during session;Repositioned;Premedicated before session    Home Living Family/patient expects to be discharged to:: Private residence Living Arrangements: Parent Available Help at Discharge: Family;Available 24 hours/day Type of Home: House Home Access: Ramped entrance     Home Layout: One level Home Equipment: Bedside commode;Walker - 2 wheels Additional Comments: has plently of DME that her parents aren't using; pt reports she is waiting on her apartment    Prior Function Level of Independence: Needs assistance   Gait / Transfers Assistance Needed: reports Independence at baseline           Hand Dominance        Extremity/Trunk Assessment   Upper Extremity Assessment Upper Extremity Assessment: Overall WFL for tasks assessed    Lower Extremity Assessment Lower Extremity Assessment: RLE deficits/detail RLE Deficits / Details: grossly 2+/5, hip limited by pain; ankle WFL       Communication   Communication: No difficulties  Cognition Arousal/Alertness: Awake/alert Behavior During Therapy: WFL for tasks assessed/performed Overall Cognitive Status: Within Functional Limits for tasks assessed                                        General Comments  Exercises Total Joint Exercises Ankle Circles/Pumps: AROM;Both;10 reps   Assessment/Plan    PT Assessment Patient needs continued PT services  PT Problem List Decreased strength;Decreased mobility;Decreased range of motion;Decreased activity tolerance;Decreased balance;Pain;Decreased knowledge of use of DME       PT Treatment Interventions DME instruction;Therapeutic activities;Gait training;Functional mobility training;Therapeutic exercise;Patient/family education    PT Goals (Current goals can be found in the Care Plan section)  Acute Rehab PT Goals Patient Stated Goal: home  when more mobile PT Goal Formulation: With patient Time For Goal Achievement: 12/31/20 Potential to Achieve Goals: Good    Frequency 7X/week   Barriers to discharge        Co-evaluation               AM-PAC PT "6 Clicks" Mobility  Outcome Measure Help needed turning from your back to your side while in a flat bed without using bedrails?: A Little Help needed moving from lying on your back to sitting on the side of a flat bed without using bedrails?: A Little Help needed moving to and from a bed to a chair (including a wheelchair)?: A Little Help needed standing up from a chair using your arms (e.g., wheelchair or bedside chair)?: A Little Help needed to walk in hospital room?: A Little Help needed climbing 3-5 steps with a railing? : A Lot 6 Click Score: 17    End of Session Equipment Utilized During Treatment: Gait belt Activity Tolerance: Patient tolerated treatment well Patient left: with call bell/phone within reach;in chair;with chair alarm set   PT Visit Diagnosis: Other abnormalities of gait and mobility (R26.89);Difficulty in walking, not elsewhere classified (R26.2)    Time: 9147-8295 PT Time Calculation (min) (ACUTE ONLY): 25 min   Charges:   PT Evaluation $PT Eval Low Complexity: 1 Low PT Treatments $Therapeutic Activity: 8-22 mins        Delice Bison, PT  Acute Rehab Dept ([facility]) [phone] Pager [phone]  12/24/2020   [facility] 12/24/2020, 4:20 PM

## 2020-12-24 NOTE — Discharge Instructions (Signed)

## 2020-12-25 ENCOUNTER — Other Ambulatory Visit: Payer: Self-pay

## 2020-12-25 DIAGNOSIS — M879 Osteonecrosis, unspecified: Secondary | ICD-10-CM | POA: Diagnosis not present

## 2020-12-25 LAB — BASIC METABOLIC PANEL
Anion gap: 11 (ref 5–15)
BUN: 9 mg/dL (ref 6–20)
CO2: 27 mmol/L (ref 22–32)
Calcium: 8 mg/dL — ABNORMAL LOW (ref 8.9–10.3)
Chloride: 96 mmol/L — ABNORMAL LOW (ref 98–111)
Creatinine, Ser: 0.83 mg/dL (ref 0.44–1.00)
GFR, Estimated: >60 mL/min (ref >60)
Glucose, Bld: 248 mg/dL — ABNORMAL HIGH (ref 70–99)
Potassium: 3.2 mmol/L — ABNORMAL LOW (ref 3.5–5.1)
Sodium: 134 mmol/L — ABNORMAL LOW (ref 135–145)

## 2020-12-25 LAB — CBC
HCT: 29.7 % — ABNORMAL LOW (ref 36.0–46.0)
Hemoglobin: 9.9 g/dL — ABNORMAL LOW (ref 12.0–15.0)
MCH: 32.6 pg (ref 26.0–34.0)
MCHC: 33.3 g/dL (ref 30.0–36.0)
MCV: 97.7 fL (ref 80.0–100.0)
Platelets: 285 10*3/uL (ref 150–400)
RBC: 3.04 MIL/uL — ABNORMAL LOW (ref 3.87–5.11)
RDW: 11.6 % (ref 11.5–15.5)
WBC: 14.4 10*3/uL — ABNORMAL HIGH (ref 4.0–10.5)
nRBC: 0 % (ref 0.0–0.2)

## 2020-12-25 LAB — HEMOGLOBIN A1C
Hgb A1c MFr Bld: 6.5 % — ABNORMAL HIGH (ref 4.8–5.6)
Mean Plasma Glucose: 140 mg/dL

## 2020-12-25 LAB — GLUCOSE, CAPILLARY
Glucose-Capillary: 202 mg/dL — ABNORMAL HIGH (ref 70–99)
Glucose-Capillary: 191 mg/dL — ABNORMAL HIGH (ref 70–99)

## 2020-12-25 NOTE — TOC Transition Note (Signed)
Transition of Care [facility]) - CM/SW Discharge Note  Patient Details  Name: [name] MRN: [id number] Date of Birth: Feb 29, 1961  [facility]) CM/SW Contact:  Sherie Don, LCSW Phone Number: 12/25/2020, 9:20 AM  Clinical Narrative: Patient is expected to discharge after working with PT. CSW met with patient to confirm discharge plan. Patient to discharge home with [place] through [facility]. Patient reported she has a rollator, wheelchair, and 3N1 at home so there are no DME needs at this time. TOC signing off.  Final next level of care: [facility] Barriers to Discharge: No Barriers Identified  Patient Goals and CMS Choice Patient states their goals for this hospitalization and ongoing recovery are:: Discharge home with HHPT through [facility] CMS Medicare.gov Compare Post Acute Care list provided to:: Patient Choice offered to / list presented to : Patient  Discharge Plan and Services       DME Arranged: N/A DME Agency: NA HH Arranged: PT HH Agency: [place] Representative spoke with at [facility] in orthopedist's office  Readmission Risk Interventions No flowsheet data found.

## 2020-12-25 NOTE — Discharge Summary (Signed)
Patient ID: Neima Lacross MRN: [id number] DOB/AGE: 61-27-1962 61 y.o.  Admit date: 12/24/2020 Discharge date: 12/25/2020  Admission Diagnoses:  Active Problems:   History of total hip arthroplasty, right   Discharge Diagnoses:  Same  Past Medical History:  Diagnosis Date  . Anxiety   . Arthritis   . COPD (chronic obstructive pulmonary disease) (HCC)   . Depression   . Dyspnea   . GERD (gastroesophageal reflux disease)   . Hypertension   . Peripheral edema    2+ at PAT visit    Surgeries: Procedure(s): RIGHT TOTAL HIP ARTHROPLASTY ANTERIOR APPROACH on 12/24/2020   Consultants:   Discharged Condition: Improved  Hospital Course: Mycah Mcdougall is an 60 y.o. female who was admitted 12/24/2020 for operative treatment of<principal problem not specified>. Patient has severe unremitting pain that affects sleep, daily activities, and work/hobbies. After pre-op clearance the patient was taken to the operating room on 12/24/2020 and underwent  Procedure(s): RIGHT TOTAL HIP ARTHROPLASTY ANTERIOR APPROACH.    Patient was given perioperative antibiotics:  Anti-infectives (From admission, onward)   Start     Dose/Rate Route Frequency Ordered Stop   12/24/20 0715  ceFAZolin (ANCEF) IVPB 2g/100 mL premix        2 g 200 mL/hr over 30 Minutes Intravenous On call to O.R. 12/24/20 0704 12/24/20 1017       Patient was given sequential compression devices, early ambulation, and chemoprophylaxis to prevent DVT.  Patient benefited maximally from hospital stay and there were no complications.    Recent vital signs:  Patient Vitals for the past 24 hrs:  BP Temp Temp src Pulse Resp SpO2  12/25/20 0957 93/82 99.2 F (37.3 C) -- 93 20 93 %  12/25/20 0858 -- -- -- -- -- 92 %  12/25/20 0558 131/72 98.4 F (36.9 C) Oral 89 16 91 %  12/25/20 0202 127/60 98.1 F (36.7 C) Oral 90 16 93 %  12/24/20 2144 (!) 142/78 99.1 F (37.3 C) Oral 86 16 91 %  12/24/20 1818 132/72 -- -- 91 17 (!) 89 %  12/24/20  1622 134/80 -- -- 91 17 96 %  12/24/20 1353 (!) 132/56 -- -- 93 20 94 %  12/24/20 1315 (!) 143/94 98.3 F (36.8 C) -- 91 17 90 %  12/24/20 1300 131/83 -- -- 90 14 94 %     Recent laboratory studies:  Recent Labs    12/24/20 0838 12/25/20 0312  WBC  --  14.4*  HGB 13.6 9.9*  HCT 40.0 29.7*  PLT  --  285  NA 141 134*  K 2.8* 3.2*  CL 94* 96*  CO2  --  27  BUN 4* 9  CREATININE 0.70 0.83  GLUCOSE 139* 248*  CALCIUM  --  8.0*     Discharge Medications:   Allergies as of 12/25/2020      Reactions   Amoxicillin    Yeast infection       Medication List    STOP taking these medications   acetaminophen 500 MG tablet Commonly known as: TYLENOL   HYDROcodone-acetaminophen 5-325 MG tablet Commonly known as: NORCO/VICODIN   traMADol 50 MG tablet Commonly known as: ULTRAM     TAKE these medications   ALLERGY EYE DROPS OP Place 1 drop into both eyes daily as needed (allergies).   amLODipine 10 MG tablet Commonly known as: NORVASC TAKE 1 TABLET BY MOUTH DAILY. What changed: how much to take   amoxicillin-clavulanate 875-125 MG tablet Commonly known as: AUGMENTIN TAKE  1 TABLET BY MOUTH 2 TIMES DAILY FOR 10 DAYS.   aspirin EC 81 MG tablet Take 1 tablet (81 mg total) by mouth 2 (two) times daily.   atorvastatin 40 MG tablet Commonly known as: LIPITOR Take 1 tablet (40 mg total) by mouth daily.   folic acid 1 MG tablet Commonly known as: FOLVITE TAKE 1 TABLET (1 MG TOTAL) BY MOUTH DAILY. What changed: how much to take   furosemide 20 MG tablet Commonly known as: LASIX TAKE 1 TABLET BY MOUTH DAILY   guaiFENesin 600 MG 12 hr tablet Commonly known as: MUCINEX Take 600 mg by mouth 2 (two) times daily.   hydrOXYzine 50 MG tablet Commonly known as: ATARAX/VISTARIL TAKE 1 TABLET (50 MG TOTAL) BY MOUTH 3 (THREE) TIMES DAILY AS NEEDED FOR ANXIETY.   Icy Hot 5 % Ptch Generic drug: Menthol Apply 1 patch topically daily as needed (hip pain).   multivitamin with  minerals Tabs tablet Take 1 tablet by mouth daily.   oxybutynin 10 MG 24 hr tablet Commonly known as: DITROPAN-XL TAKE 1 TABLET (10 MG TOTAL) BY MOUTH AT BEDTIME. What changed: how much to take   oxyCODONE-acetaminophen 5-325 MG tablet Commonly known as: PERCOCET/ROXICET Take 1 tablet by mouth every 4 (four) hours as needed for severe pain.   pantoprazole 40 MG tablet Commonly known as: PROTONIX Take 1 tablet (40 mg total) by mouth 2 (two) times daily.   POTASSIUM PO Take 1 tablet by mouth daily.   QUEtiapine 200 MG tablet Commonly known as: SEROQUEL TAKE 1 TABLET (200 MG TOTAL) BY MOUTH AT BEDTIME. What changed: how much to take   sertraline 100 MG tablet Commonly known as: ZOLOFT TAKE 1 TABLET (100 MG TOTAL) BY MOUTH DAILY WITH BREAKFAST. What changed:   how much to take  how to take this  when to take this   tiZANidine 2 MG tablet Commonly known as: ZANAFLEX Take 1 tablet (2 mg total) by mouth every 6 (six) hours as needed.   Trelegy Ellipta 100-62.5-25 MCG/INH Aepb Generic drug: Fluticasone-Umeclidin-Vilant Inhale 1 puff into the lungs daily.   VICKS DAYQUIL/NYQUIL COUGH PO Take 1 Dose by mouth daily as needed (cough).   VITAMIN D PO Take 1 capsule by mouth daily.            Durable Medical Equipment  (From admission, onward)         Start     Ordered   12/24/20 1338  DME Walker rolling  Once       Question:  Patient needs a walker to treat with the following condition  Answer:  Status post right hip replacement   12/24/20 1337   12/24/20 1338  DME 3 n 1  Once        12/24/20 1337           Discharge Care Instructions  (From admission, onward)         Start     Ordered   12/25/20 0000  Weight bearing as tolerated        12/25/20 1257   12/24/20 0000  Weight bearing as tolerated        12/24/20 1202          Diagnostic Studies: DG Chest 2 View  Result Date: 12/18/2020 CLINICAL DATA:  60 year old female with a history of pending  surgery EXAM: CHEST - 2 VIEW COMPARISON:  12/29/2019 FINDINGS: Cardiomediastinal silhouette unchanged in size and contour. No pneumothorax. No pleural effusion. No confluent airspace  disease. Coarsened interstitial markings similar to the prior. No acute displaced fracture. IMPRESSION: Negative for acute cardiopulmonary disease Electronically Signed   By: Gilmer Mor D.O.   On: 12/18/2020 09:16   DG C-Arm 1-60 Min-No Report  Result Date: 12/24/2020 CLINICAL DATA:  Right anterior hip arthroplasty. EXAM: OPERATIVE right HIP (WITH PELVIS IF PERFORMED) 1 VIEWS TECHNIQUE: Fluoroscopic spot image(s) were submitted for interpretation post-operatively. Fluoroscopy time: 18 seconds Dosage: 4.16 mGy COMPARISON:  08/14/2020 FINDINGS: Postoperative change consistent with right total hip arthroplasty identified. The hardware components are in anatomic alignment. No periprosthetic fracture. There is a ribbon like foreign body overlying the greater trochanter of the proximal right femur. This conforms to the radiographic appearance of a laparotomy sponge marker. Although this may be external to the patient clinical correlation is advised to exclude retained surgical sponge. IMPRESSION: 1. Status post right total hip arthroplasty. No periprosthetic fracture or dislocation. 2. Radiopaque foreign body overlying the greater trochanter of the proximal right femur conforms to the radiographic appearance of a laparotomy sponge marker. Although this may be external to the patient clinical correlation is recommended to exclude retained surgical sponge. These results will be called to the ordering clinician or representative by the Radiologist Assistant, and communication documented in the PACS or [employer]. Electronically Signed   By: Signa Kell M.D.   On: 12/24/2020 14:32   DG HIP OPERATIVE UNILAT W OR W/O PELVIS RIGHT  Result Date: 12/24/2020 CLINICAL DATA:  Right anterior hip arthroplasty. EXAM: OPERATIVE right HIP  (WITH PELVIS IF PERFORMED) 1 VIEWS TECHNIQUE: Fluoroscopic spot image(s) were submitted for interpretation post-operatively. Fluoroscopy time: 18 seconds Dosage: 4.16 mGy COMPARISON:  08/14/2020 FINDINGS: Postoperative change consistent with right total hip arthroplasty identified. The hardware components are in anatomic alignment. No periprosthetic fracture. There is a ribbon like foreign body overlying the greater trochanter of the proximal right femur. This conforms to the radiographic appearance of a laparotomy sponge marker. Although this may be external to the patient clinical correlation is advised to exclude retained surgical sponge. IMPRESSION: 1. Status post right total hip arthroplasty. No periprosthetic fracture or dislocation. 2. Radiopaque foreign body overlying the greater trochanter of the proximal right femur conforms to the radiographic appearance of a laparotomy sponge marker. Although this may be external to the patient clinical correlation is recommended to exclude retained surgical sponge. These results will be called to the ordering clinician or representative by the Radiologist Assistant, and communication documented in the PACS or [employer]. Electronically Signed   By: Signa Kell M.D.   On: 12/24/2020 14:32    Disposition: Discharge disposition: 01-Home or Self Care       Discharge Instructions    Call MD / Call 911   Complete by: As directed    If you experience chest pain or shortness of breath, CALL 911 and be transported to the hospital emergency room.  If you develope a fever above 101 F, pus (white drainage) or increased drainage or redness at the wound, or calf pain, call your surgeon's office.   Call MD / Call 911   Complete by: As directed    If you experience chest pain or shortness of breath, CALL 911 and be transported to the hospital emergency room.  If you develope a fever above 101 F, pus (white drainage) or increased drainage or redness at the  wound, or calf pain, call your surgeon's office.   Constipation Prevention   Complete by: As directed    Drink plenty  of fluids.  Prune juice may be helpful.  You may use a stool softener, such as Colace (over the counter) 100 mg twice a day.  Use MiraLax (over the counter) for constipation as needed.   Constipation Prevention   Complete by: As directed    Drink plenty of fluids.  Prune juice may be helpful.  You may use a stool softener, such as Colace (over the counter) 100 mg twice a day.  Use MiraLax (over the counter) for constipation as needed.   Diet - low sodium heart healthy   Complete by: As directed    Driving restrictions   Complete by: As directed    No driving for 2 weeks   Driving restrictions   Complete by: As directed    No driving for 2 weeks   Increase activity slowly as tolerated   Complete by: As directed    Increase activity slowly as tolerated   Complete by: As directed    Patient may shower   Complete by: As directed    You may shower without a dressing once there is no drainage.  Do not wash over the wound.  If drainage remains, cover wound with plastic wrap and then shower.   Patient may shower   Complete by: As directed    You may shower without a dressing once there is no drainage.  Do not wash over the wound.  If drainage remains, cover wound with plastic wrap and then shower.   Post-operative opioid taper instructions:   Complete by: As directed    POST-OPERATIVE OPIOID TAPER INSTRUCTIONS: It is important to wean off of your opioid medication as soon as possible. If you do not need pain medication after your surgery it is ok to stop day one. Opioids include: Codeine, Hydrocodone(Norco, Vicodin), Oxycodone(Percocet, oxycontin) and hydromorphone amongst others.  Long term and even short term use of opiods can cause: Increased pain response Dependence Constipation Depression Respiratory depression And more.  Withdrawal symptoms can include Flu like  symptoms Nausea, vomiting And more Techniques to manage these symptoms Hydrate well Eat regular healthy meals Stay active Use relaxation techniques(deep breathing, meditating, yoga) Do Not substitute Alcohol to help with tapering If you have been on opioids for less than two weeks and do not have pain than it is ok to stop all together.  Plan to wean off of opioids This plan should start within one week post op of your joint replacement. Maintain the same interval or time between taking each dose and first decrease the dose.  Cut the total daily intake of opioids by one tablet each day Next start to increase the time between doses. The last dose that should be eliminated is the evening dose.      Post-operative opioid taper instructions:   Complete by: As directed    POST-OPERATIVE OPIOID TAPER INSTRUCTIONS: It is important to wean off of your opioid medication as soon as possible. If you do not need pain medication after your surgery it is ok to stop day one. Opioids include: Codeine, Hydrocodone(Norco, Vicodin), Oxycodone(Percocet, oxycontin) and hydromorphone amongst others.  Long term and even short term use of opiods can cause: Increased pain response Dependence Constipation Depression Respiratory depression And more.  Withdrawal symptoms can include Flu like symptoms Nausea, vomiting And more Techniques to manage these symptoms Hydrate well Eat regular healthy meals Stay active Use relaxation techniques(deep breathing, meditating, yoga) Do Not substitute Alcohol to help with tapering If you have been on opioids for  less than two weeks and do not have pain than it is ok to stop all together.  Plan to wean off of opioids This plan should start within one week post op of your joint replacement. Maintain the same interval or time between taking each dose and first decrease the dose.  Cut the total daily intake of opioids by one tablet each day Next start to increase  the time between doses. The last dose that should be eliminated is the evening dose.      Weight bearing as tolerated   Complete by: As directed    Weight bearing as tolerated   Complete by: As directed        Follow-up Information    Gean Birchwood, MD In 2 weeks.   Specialty: Orthopedic Surgery Contact information: [address] [place] [address] [phone]                Signed: Dannielle Burn 12/25/2020, 12:58 PM

## 2020-12-25 NOTE — Progress Notes (Signed)
   12/25/20 1400  PT Visit Information  Last PT Received On 12/25/20  Pt continues to progress well. Incr amb distance and tolerance. Pt reports decr pain with mobility   Assistance Needed +1  History of Present Illness s/p R DA THA. PMH: GAD, cirrhosis, hx ETOH, GIB, COPD, HTN  Subjective Data  Patient Stated Goal home when more mobile  Precautions  Precautions Fall  Restrictions  Weight Bearing Restrictions No  Other Position/Activity Restrictions WBAT  Pain Assessment  Pain Assessment 0-10  Pain Score 4  Pain Location R hip  Pain Descriptors / Indicators Grimacing;Sore  Pain Intervention(s) Limited activity within patient's tolerance;Monitored during session;Repositioned  Cognition  Arousal/Alertness Awake/alert  Behavior During Therapy WFL for tasks assessed/performed  Overall Cognitive Status Within Functional Limits for tasks assessed  Transfers  Overall transfer level Needs assistance  Equipment used Rolling walker (2 wheeled)  Transfers Sit to/from Stand  Sit to Stand Supervision  General transfer comment cues for hand placement  Ambulation/Gait  Ambulation/Gait assistance Supervision  Gait Distance (Feet) 90 Feet  Assistive device Rolling walker (2 wheeled)  Gait Pattern/deviations Step-to pattern;Wide base of support  General Gait Details cues for RW position, sequence  PT - End of Session  Equipment Utilized During Treatment Gait belt  Activity Tolerance Patient tolerated treatment well  Patient left with call bell/phone within reach;in bed;with bed alarm set (EOB per pt request)   PT - Assessment/Plan  PT Plan Current plan remains appropriate  PT Visit Diagnosis Other abnormalities of gait and mobility (R26.89);Difficulty in walking, not elsewhere classified (R26.2)  PT Frequency (ACUTE ONLY) 7X/week  Follow Up Recommendations Follow surgeon's recommendation for DC plan and follow-up therapies  PT equipment None recommended by PT  AM-PAC PT "6 Clicks"  Mobility Outcome Measure (Version 2)  Help needed turning from your back to your side while in a flat bed without using bedrails? 3  Help needed moving from lying on your back to sitting on the side of a flat bed without using bedrails? 3  Help needed moving to and from a bed to a chair (including a wheelchair)? 3  Help needed standing up from a chair using your arms (e.g., wheelchair or bedside chair)? 3  Help needed to walk in hospital room? 3  Help needed climbing 3-5 steps with a railing?  3  6 Click Score 18  Consider Recommendation of Discharge To: Home with [facility]  PT Goal Progression  Progress towards PT goals Progressing toward goals  Acute Rehab PT Goals  PT Goal Formulation With patient  Time For Goal Achievement 12/31/20  Potential to Achieve Goals Good  PT Time Calculation  PT Start Time (ACUTE ONLY) 1407  PT Stop Time (ACUTE ONLY) 1420  PT Time Calculation (min) (ACUTE ONLY) 13 min  PT General Charges  $$ ACUTE PT VISIT 1 Visit  PT Treatments  $Gait Training 8-22 mins

## 2020-12-25 NOTE — Progress Notes (Signed)
Physical Therapy Treatment Patient Details Name: [name] MRN: [id number] DOB: 1960/08/12 Today's Date: 12/25/2020    History of Present Illness s/p R DA THA. PMH: GAD, cirrhosis, hx ETOH, GIB, COPD, HTN    PT Comments    Pt progressing well. Will see for second session and should be able to d/c later today. Pt is motivated to go home   Follow Up Recommendations  Follow surgeon's recommendation for DC plan and follow-up therapies     Equipment Recommendations  None recommended by PT    Recommendations for Other Services       Precautions / Restrictions Precautions Precautions: Fall Restrictions Weight Bearing Restrictions: No Other Position/Activity Restrictions: WBAT    Mobility  Bed Mobility Overal bed mobility: Needs Assistance Bed Mobility: Supine to Sit     Supine to sit: Supervision     General bed mobility comments: incr time and effort    Transfers Overall transfer level: Needs assistance Equipment used: Rolling walker (2 wheeled) Transfers: Sit to/from Stand Sit to Stand: Min guard         General transfer comment: cues for hand placement  Ambulation/Gait Ambulation/Gait assistance: Min guard Gait Distance (Feet): 80 Feet (10' more) Assistive device: Rolling walker (2 wheeled) Gait Pattern/deviations: Step-to pattern;Wide base of support     General Gait Details: cues for RW position, sequence   Stairs             Wheelchair Mobility    Modified Rankin (Stroke Patients Only)       Balance                                            Cognition Arousal/Alertness: Awake/alert Behavior During Therapy: WFL for tasks assessed/performed Overall Cognitive Status: Within Functional Limits for tasks assessed                                        Exercises Total Joint Exercises Ankle Circles/Pumps: AROM;Both;10 reps Quad Sets: AROM;Both;10 reps Heel Slides: AAROM;Right;10 reps Hip  ABduction/ADduction: AAROM;Right;10 reps    General Comments        Pertinent Vitals/Pain Pain Assessment: 0-10 Pain Score: 4  Pain Location: R hip Pain Descriptors / Indicators: Grimacing;Sore Pain Intervention(s): Limited activity within patient's tolerance;Monitored during session;Premedicated before session;Repositioned    Home Living                      Prior Function            PT Goals (current goals can now be found in the care plan section) Acute Rehab PT Goals Patient Stated Goal: home when more mobile PT Goal Formulation: With patient Time For Goal Achievement: 12/31/20 Potential to Achieve Goals: Good Progress towards PT goals: Progressing toward goals    Frequency    7X/week      PT Plan Current plan remains appropriate    Co-evaluation              AM-PAC PT "6 Clicks" Mobility   Outcome Measure  Help needed turning from your back to your side while in a flat bed without using bedrails?: A Little Help needed moving from lying on your back to sitting on the side of a flat bed without using bedrails?: A Little Help  needed moving to and from a bed to a chair (including a wheelchair)?: A Little Help needed standing up from a chair using your arms (e.g., wheelchair or bedside chair)?: A Little Help needed to walk in hospital room?: A Little Help needed climbing 3-5 steps with a railing? : A Lot 6 Click Score: 17    End of Session Equipment Utilized During Treatment: Gait belt Activity Tolerance: Patient tolerated treatment well Patient left: with call bell/phone within reach;in chair;with chair alarm set   PT Visit Diagnosis: Other abnormalities of gait and mobility (R26.89);Difficulty in walking, not elsewhere classified (R26.2)     Time: 8101-7510 PT Time Calculation (min) (ACUTE ONLY): 18 min  Charges:  $Gait Training: 8-22 mins                     Delice Bison, PT  Acute Rehab Dept ([facility]) [phone] Pager  [phone]  12/25/2020    [facility] 12/25/2020, 12:19 PM

## 2020-12-25 NOTE — Anesthesia Postprocedure Evaluation (Signed)
Anesthesia Post Note  Patient: [name]  Procedure(s) Performed: RIGHT TOTAL HIP ARTHROPLASTY ANTERIOR APPROACH (Right Hip)     Patient location during evaluation: PACU Anesthesia Type: Spinal Level of consciousness: awake and alert Pain management: pain level controlled Vital Signs Assessment: post-procedure vital signs reviewed and stable Respiratory status: spontaneous breathing and respiratory function stable Cardiovascular status: blood pressure returned to baseline and stable Postop Assessment: spinal receding and no apparent nausea or vomiting Anesthetic complications: no   No complications documented.  Last Vitals:  Vitals:   12/25/20 0858 12/25/20 0957  BP:  93/82  Pulse:  93  Resp:  20  Temp:  37.3 C  SpO2: 92% 93%    Last Pain:  Vitals:   12/25/20 0900  TempSrc:   PainSc: 4    Pain Goal:                   Beryle Lathe

## 2020-12-27 ENCOUNTER — Telehealth: Payer: Self-pay | Admitting: Family Medicine

## 2020-12-27 NOTE — Telephone Encounter (Signed)
Patient called to request refill on :   HYDROcodone-acetaminophen (NORCO/VICODIN) 5-325 MG tablet [[id number]] DISCONTINUED  Order Details Dose: 1 tablet Route: Oral Frequency: Every 8 hours PRN  Dispense Quantity: 15 tablet Refills: 0       Sig: Take 1 tablet by mouth every 8 (eight) hours as needed.  Patient not taking: No sig reported   Patient uses :   Pharmacy  [facility] [address], [place] - [address]  [address] [place] [address]  Phone:  [phone] Fax:  [phone]    --glh

## 2020-12-28 ENCOUNTER — Encounter (HOSPITAL_COMMUNITY): Payer: Self-pay | Admitting: Orthopedic Surgery

## 2020-12-28 NOTE — Telephone Encounter (Signed)
Left VM for patient. If she calls back please have her speak with a nurse/CMA and inform that she should ask her surgeon for pain medication if she is having post surgical pain.   If any questions then please take the best time and phone number to call and I will try to call her back.   Myra Rude, MD [facility] 12/28/2020, 7:57 AM

## 2020-12-29 ENCOUNTER — Other Ambulatory Visit (INDEPENDENT_AMBULATORY_CARE_PROVIDER_SITE_OTHER): Payer: Self-pay | Admitting: Primary Care

## 2020-12-29 NOTE — Telephone Encounter (Signed)
Pt of Dr. Earlene Plater- rerouting.

## 2020-12-31 ENCOUNTER — Inpatient Hospital Stay (HOSPITAL_COMMUNITY)
Admission: EM | Admit: 2020-12-31 | Discharge: 2021-01-03 | DRG: 871 | Disposition: A | Discharge status: Home Health | Payer: Medicaid Other | Attending: Family Medicine | Admitting: Family Medicine

## 2020-12-31 ENCOUNTER — Inpatient Hospital Stay (HOSPITAL_COMMUNITY): Payer: Medicaid Other

## 2020-12-31 ENCOUNTER — Emergency Department (HOSPITAL_COMMUNITY): Payer: Medicaid Other

## 2020-12-31 ENCOUNTER — Encounter (HOSPITAL_COMMUNITY): Payer: Self-pay | Admitting: Family Medicine

## 2020-12-31 ENCOUNTER — Other Ambulatory Visit: Payer: Self-pay

## 2020-12-31 DIAGNOSIS — Z8249 Family history of ischemic heart disease and other diseases of the circulatory system: Secondary | ICD-10-CM

## 2020-12-31 DIAGNOSIS — Z66 Do not resuscitate: Secondary | ICD-10-CM | POA: Diagnosis present

## 2020-12-31 DIAGNOSIS — I10 Essential (primary) hypertension: Secondary | ICD-10-CM | POA: Diagnosis present

## 2020-12-31 DIAGNOSIS — A419 Sepsis, unspecified organism: Secondary | ICD-10-CM | POA: Diagnosis present

## 2020-12-31 DIAGNOSIS — J189 Pneumonia, unspecified organism: Secondary | ICD-10-CM | POA: Diagnosis present

## 2020-12-31 DIAGNOSIS — F29 Unspecified psychosis not due to a substance or known physiological condition: Secondary | ICD-10-CM | POA: Diagnosis not present

## 2020-12-31 DIAGNOSIS — D509 Iron deficiency anemia, unspecified: Secondary | ICD-10-CM | POA: Diagnosis present

## 2020-12-31 DIAGNOSIS — F17210 Nicotine dependence, cigarettes, uncomplicated: Secondary | ICD-10-CM | POA: Diagnosis present

## 2020-12-31 DIAGNOSIS — R609 Edema, unspecified: Secondary | ICD-10-CM

## 2020-12-31 DIAGNOSIS — K7460 Unspecified cirrhosis of liver: Secondary | ICD-10-CM | POA: Diagnosis present

## 2020-12-31 DIAGNOSIS — D75839 Thrombocytosis, unspecified: Secondary | ICD-10-CM | POA: Diagnosis present

## 2020-12-31 DIAGNOSIS — Y95 Nosocomial condition: Secondary | ICD-10-CM

## 2020-12-31 DIAGNOSIS — F411 Generalized anxiety disorder: Secondary | ICD-10-CM | POA: Diagnosis present

## 2020-12-31 DIAGNOSIS — R0609 Other forms of dyspnea: Secondary | ICD-10-CM | POA: Diagnosis not present

## 2020-12-31 DIAGNOSIS — K219 Gastro-esophageal reflux disease without esophagitis: Secondary | ICD-10-CM | POA: Diagnosis present

## 2020-12-31 DIAGNOSIS — Z20822 Contact with and (suspected) exposure to covid-19: Secondary | ICD-10-CM | POA: Diagnosis present

## 2020-12-31 DIAGNOSIS — Z96641 Presence of right artificial hip joint: Secondary | ICD-10-CM | POA: Diagnosis not present

## 2020-12-31 DIAGNOSIS — M545 Low back pain, unspecified: Secondary | ICD-10-CM | POA: Diagnosis present

## 2020-12-31 DIAGNOSIS — E876 Hypokalemia: Secondary | ICD-10-CM | POA: Diagnosis present

## 2020-12-31 DIAGNOSIS — T380X5A Adverse effect of glucocorticoids and synthetic analogues, initial encounter: Secondary | ICD-10-CM | POA: Diagnosis present

## 2020-12-31 DIAGNOSIS — Z79899 Other long term (current) drug therapy: Secondary | ICD-10-CM

## 2020-12-31 DIAGNOSIS — Z9889 Other specified postprocedural states: Secondary | ICD-10-CM

## 2020-12-31 DIAGNOSIS — J9601 Acute respiratory failure with hypoxia: Secondary | ICD-10-CM | POA: Diagnosis present

## 2020-12-31 DIAGNOSIS — Z7951 Long term (current) use of inhaled steroids: Secondary | ICD-10-CM

## 2020-12-31 DIAGNOSIS — Z7982 Long term (current) use of aspirin: Secondary | ICD-10-CM

## 2020-12-31 DIAGNOSIS — Z90710 Acquired absence of both cervix and uterus: Secondary | ICD-10-CM

## 2020-12-31 DIAGNOSIS — Z72820 Sleep deprivation: Secondary | ICD-10-CM | POA: Diagnosis not present

## 2020-12-31 DIAGNOSIS — E46 Unspecified protein-calorie malnutrition: Secondary | ICD-10-CM | POA: Diagnosis present

## 2020-12-31 DIAGNOSIS — M25559 Pain in unspecified hip: Secondary | ICD-10-CM | POA: Diagnosis not present

## 2020-12-31 DIAGNOSIS — Z888 Allergy status to other drugs, medicaments and biological substances status: Secondary | ICD-10-CM

## 2020-12-31 DIAGNOSIS — M1990 Unspecified osteoarthritis, unspecified site: Secondary | ICD-10-CM | POA: Diagnosis present

## 2020-12-31 DIAGNOSIS — R062 Wheezing: Secondary | ICD-10-CM

## 2020-12-31 DIAGNOSIS — F32A Depression, unspecified: Secondary | ICD-10-CM | POA: Diagnosis present

## 2020-12-31 DIAGNOSIS — G9341 Metabolic encephalopathy: Secondary | ICD-10-CM | POA: Diagnosis present

## 2020-12-31 DIAGNOSIS — F419 Anxiety disorder, unspecified: Secondary | ICD-10-CM | POA: Diagnosis not present

## 2020-12-31 DIAGNOSIS — R0902 Hypoxemia: Secondary | ICD-10-CM | POA: Diagnosis not present

## 2020-12-31 DIAGNOSIS — J440 Chronic obstructive pulmonary disease with acute lower respiratory infection: Secondary | ICD-10-CM | POA: Diagnosis present

## 2020-12-31 DIAGNOSIS — Z6841 Body Mass Index (BMI) 40.0 and over, adult: Secondary | ICD-10-CM

## 2020-12-31 DIAGNOSIS — E119 Type 2 diabetes mellitus without complications: Secondary | ICD-10-CM

## 2020-12-31 DIAGNOSIS — E1165 Type 2 diabetes mellitus with hyperglycemia: Secondary | ICD-10-CM | POA: Diagnosis present

## 2020-12-31 DIAGNOSIS — E873 Alkalosis: Secondary | ICD-10-CM | POA: Diagnosis present

## 2020-12-31 DIAGNOSIS — Z880 Allergy status to penicillin: Secondary | ICD-10-CM

## 2020-12-31 DIAGNOSIS — N1832 Chronic kidney disease, stage 3b: Secondary | ICD-10-CM

## 2020-12-31 LAB — BASIC METABOLIC PANEL
Anion gap: 14 (ref 5–15)
BUN: 5 mg/dL — ABNORMAL LOW (ref 6–20)
CO2: 28 mmol/L (ref 22–32)
Calcium: 8.2 mg/dL — ABNORMAL LOW (ref 8.9–10.3)
Chloride: 93 mmol/L — ABNORMAL LOW (ref 98–111)
Creatinine, Ser: 0.75 mg/dL (ref 0.44–1.00)
GFR, Estimated: >60 mL/min (ref >60)
Glucose, Bld: 242 mg/dL — ABNORMAL HIGH (ref 70–99)
Potassium: 2.7 mmol/L — CL (ref 3.5–5.1)
Sodium: 135 mmol/L (ref 135–145)

## 2020-12-31 LAB — I-STAT ARTERIAL BLOOD GAS, ED
Acid-Base Excess: 10 mmol/L — ABNORMAL HIGH (ref 0.0–2.0)
Bicarbonate: 31.3 mmol/L — ABNORMAL HIGH (ref 20.0–28.0)
Calcium, Ion: 0.96 mmol/L — ABNORMAL LOW (ref 1.15–1.40)
HCT: 23 % — ABNORMAL LOW (ref 36.0–46.0)
Hemoglobin: 7.8 g/dL — ABNORMAL LOW (ref 12.0–15.0)
O2 Saturation: 100 %
Patient temperature: 98.5
Potassium: 2.1 mmol/L — CL (ref 3.5–5.1)
Sodium: 132 mmol/L — ABNORMAL LOW (ref 135–145)
TCO2: 32 mmol/L (ref 22–32)
pCO2 arterial: 26.7 mmHg — ABNORMAL LOW (ref 32.0–48.0)
pH, Arterial: 7.678 (ref 7.350–7.450)
pO2, Arterial: 127 mmHg — ABNORMAL HIGH (ref 83.0–108.0)

## 2020-12-31 LAB — CBC WITH DIFFERENTIAL/PLATELET
Abs Immature Granulocytes: 0.11 10*3/uL — ABNORMAL HIGH (ref 0.00–0.07)
Basophils Absolute: 0.1 10*3/uL (ref 0.0–0.1)
Basophils Relative: 1 %
Eosinophils Absolute: 0.4 10*3/uL (ref 0.0–0.5)
Eosinophils Relative: 3 %
HCT: 25.5 % — ABNORMAL LOW (ref 36.0–46.0)
Hemoglobin: 8.3 g/dL — ABNORMAL LOW (ref 12.0–15.0)
Immature Granulocytes: 1 %
Lymphocytes Relative: 14 %
Lymphs Abs: 1.7 10*3/uL (ref 0.7–4.0)
MCH: 32.4 pg (ref 26.0–34.0)
MCHC: 32.5 g/dL (ref 30.0–36.0)
MCV: 99.6 fL (ref 80.0–100.0)
Monocytes Absolute: 1.3 10*3/uL — ABNORMAL HIGH (ref 0.1–1.0)
Monocytes Relative: 11 %
Neutro Abs: 8.4 10*3/uL — ABNORMAL HIGH (ref 1.7–7.7)
Neutrophils Relative %: 70 %
Platelets: 434 10*3/uL — ABNORMAL HIGH (ref 150–400)
RBC: 2.56 MIL/uL — ABNORMAL LOW (ref 3.87–5.11)
RDW: 12.1 % (ref 11.5–15.5)
WBC: 11.9 10*3/uL — ABNORMAL HIGH (ref 4.0–10.5)
nRBC: 0.8 % — ABNORMAL HIGH (ref 0.0–0.2)

## 2020-12-31 LAB — SALICYLATE LEVEL: Salicylate Lvl: <7 mg/dL — ABNORMAL LOW (ref 7.0–30.0)

## 2020-12-31 LAB — LIPASE, BLOOD: Lipase: 21 U/L (ref 11–51)

## 2020-12-31 LAB — RESP PANEL BY RT-PCR (FLU A&B, COVID) ARPGX2
Influenza A by PCR: NEGATIVE
Influenza B by PCR: NEGATIVE
SARS Coronavirus 2 by RT PCR: NEGATIVE

## 2020-12-31 LAB — COMPREHENSIVE METABOLIC PANEL
ALT: 19 U/L (ref 0–44)
AST: 19 U/L (ref 15–41)
Albumin: 2.7 g/dL — ABNORMAL LOW (ref 3.5–5.0)
Alkaline Phosphatase: 89 U/L (ref 38–126)
Anion gap: 12 (ref 5–15)
BUN: 7 mg/dL (ref 6–20)
CO2: 29 mmol/L (ref 22–32)
Calcium: 8.3 mg/dL — ABNORMAL LOW (ref 8.9–10.3)
Chloride: 98 mmol/L (ref 98–111)
Creatinine, Ser: 0.74 mg/dL (ref 0.44–1.00)
GFR, Estimated: >60 mL/min (ref >60)
Glucose, Bld: 191 mg/dL — ABNORMAL HIGH (ref 70–99)
Potassium: 2.9 mmol/L — ABNORMAL LOW (ref 3.5–5.1)
Sodium: 139 mmol/L (ref 135–145)
Total Bilirubin: 1.2 mg/dL (ref 0.3–1.2)
Total Protein: 6.1 g/dL — ABNORMAL LOW (ref 6.5–8.1)

## 2020-12-31 LAB — CBC
HCT: 26.3 % — ABNORMAL LOW (ref 36.0–46.0)
Hemoglobin: 8.6 g/dL — ABNORMAL LOW (ref 12.0–15.0)
MCH: 31.7 pg (ref 26.0–34.0)
MCHC: 32.7 g/dL (ref 30.0–36.0)
MCV: 97 fL (ref 80.0–100.0)
Platelets: 479 10*3/uL — ABNORMAL HIGH (ref 150–400)
RBC: 2.71 MIL/uL — ABNORMAL LOW (ref 3.87–5.11)
RDW: 12.2 % (ref 11.5–15.5)
WBC: 12.5 10*3/uL — ABNORMAL HIGH (ref 4.0–10.5)
nRBC: 0.5 % — ABNORMAL HIGH (ref 0.0–0.2)

## 2020-12-31 LAB — URINALYSIS, ROUTINE W REFLEX MICROSCOPIC
Bilirubin Urine: NEGATIVE
Glucose, UA: NEGATIVE mg/dL
Hgb urine dipstick: NEGATIVE
Ketones, ur: NEGATIVE mg/dL
Leukocytes,Ua: NEGATIVE
Nitrite: NEGATIVE
Protein, ur: NEGATIVE mg/dL
Specific Gravity, Urine: 1.004 — ABNORMAL LOW (ref 1.005–1.030)
pH: 7 (ref 5.0–8.0)

## 2020-12-31 LAB — ETHANOL: Alcohol, Ethyl (B): <10 mg/dL (ref <10)

## 2020-12-31 LAB — PROTIME-INR
INR: 1.2 (ref 0.8–1.2)
Prothrombin Time: 14.9 seconds (ref 11.4–15.2)

## 2020-12-31 LAB — HIV ANTIBODY (ROUTINE TESTING W REFLEX): HIV Screen 4th Generation wRfx: NONREACTIVE

## 2020-12-31 LAB — LACTIC ACID, PLASMA: Lactic Acid, Venous: 2 mmol/L (ref 0.5–1.9)

## 2020-12-31 LAB — AMMONIA: Ammonia: 52 umol/L — ABNORMAL HIGH (ref 9–35)

## 2020-12-31 LAB — BRAIN NATRIURETIC PEPTIDE: B Natriuretic Peptide: 358.3 pg/mL — ABNORMAL HIGH (ref 0.0–100.0)

## 2020-12-31 LAB — MRSA PCR SCREENING: MRSA by PCR: NEGATIVE

## 2020-12-31 LAB — PROCALCITONIN: Procalcitonin: 0.15 ng/mL

## 2020-12-31 LAB — D-DIMER, QUANTITATIVE: D-Dimer, Quant: 2.91 ug/mL-FEU — ABNORMAL HIGH (ref 0.00–0.50)

## 2020-12-31 LAB — MAGNESIUM: Magnesium: 1.6 mg/dL — ABNORMAL LOW (ref 1.7–2.4)

## 2020-12-31 IMAGING — CT CT ANGIO CHEST
2 of 6 series · 19 of 36 positions shown · IV contrast (omnipaque)
Comparison: Chest x-ray [DATE]

CLINICAL DATA: Shortness of breath

EXAM:
CT ANGIOGRAPHY CHEST WITH CONTRAST
TECHNIQUE: Multidetector CT imaging of the chest was performed using the
standard protocol during bolus administration of intravenous
contrast. Multiplanar CT image reconstructions and MIPs were
obtained to evaluate the vascular anatomy.
CONTRAST:  50mL OMNIPAQUE IOHEXOL 350 MG/ML SOLN

[Series 7: pe thins · axial · 0.98mm/px · z∈[+1321,+1579]mm · 18 of 410 slices shown]
[im 21/410  lung]
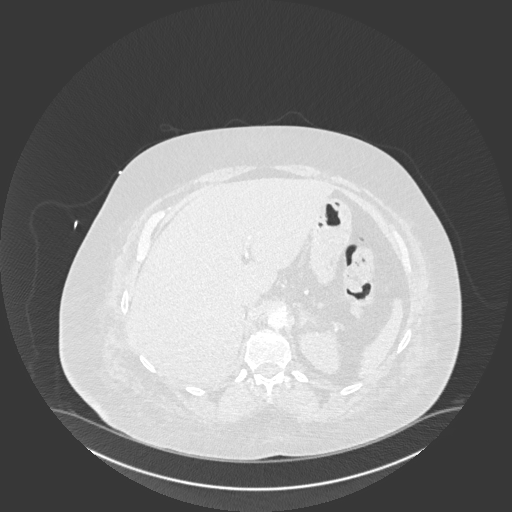
[im 41/410  mediastinal]
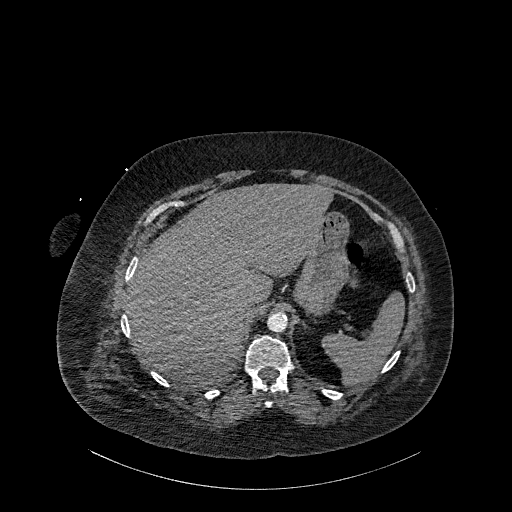
[im 62/410  lung]
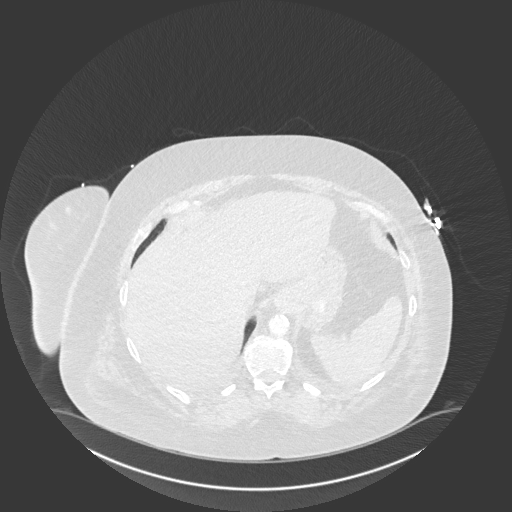
[im 82/410  mediastinal]
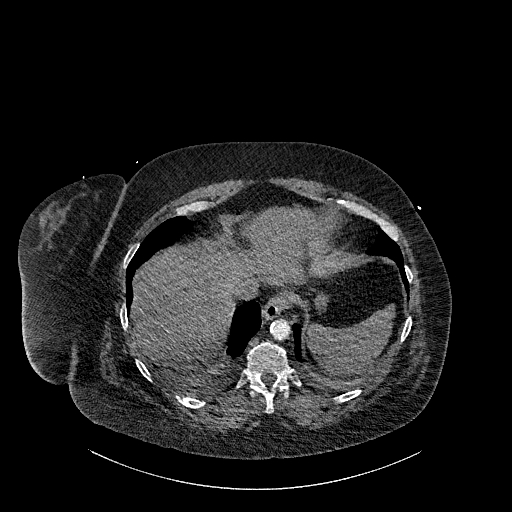
[im 103/410  lung]
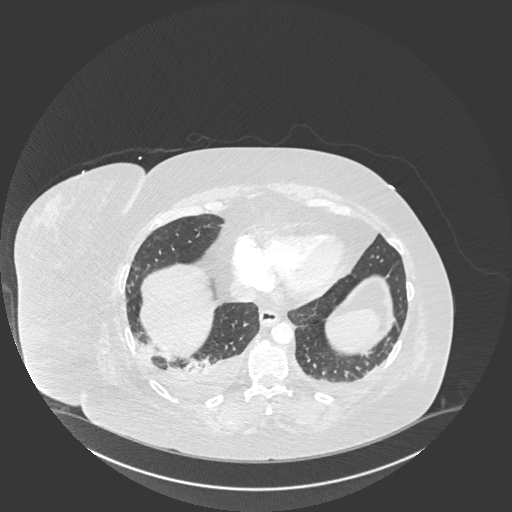
[im 123/410  mediastinal]
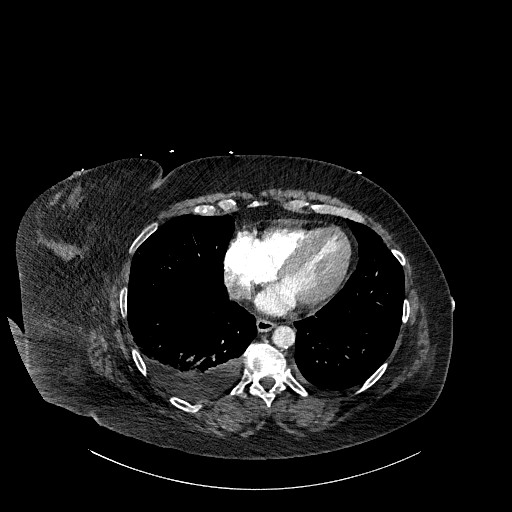
[im 144/410  lung]
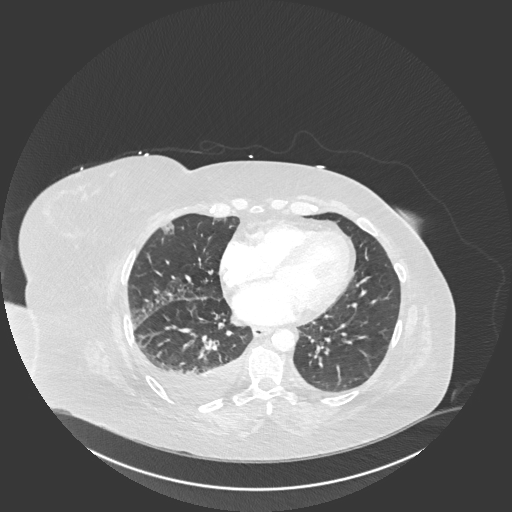
[im 164/410  mediastinal]
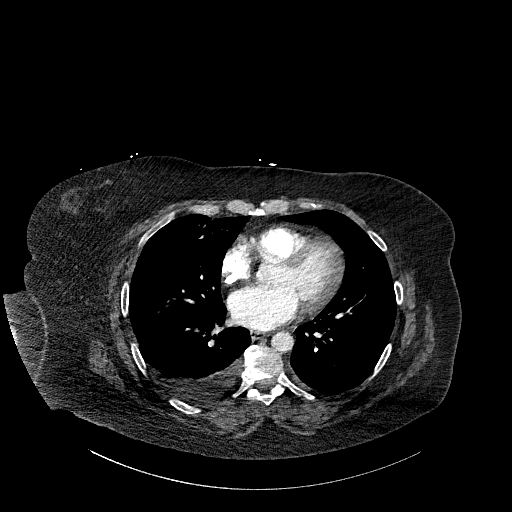
[im 185/410  lung]
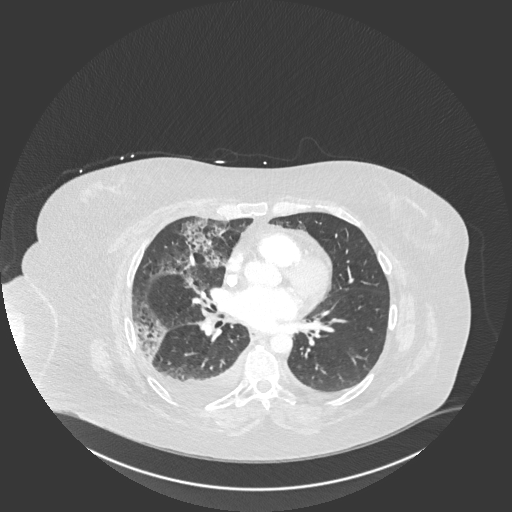
[im 225/410  mediastinal]
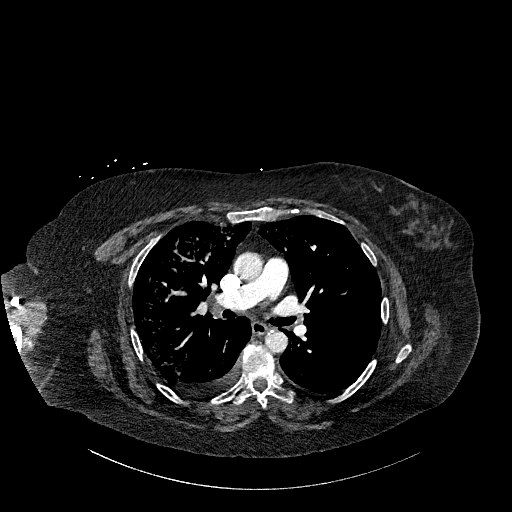
[im 246/410  lung]
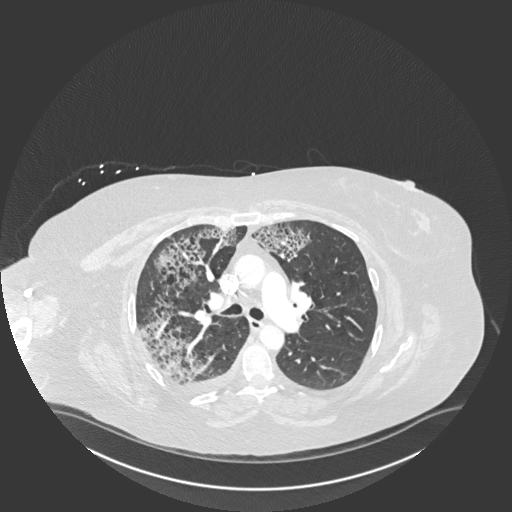
[im 266/410  mediastinal]
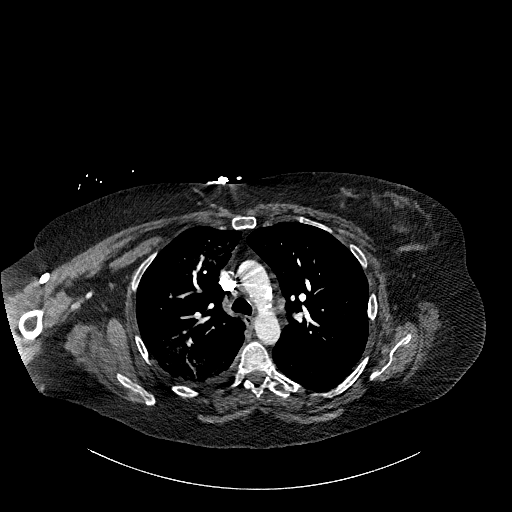
[im 287/410  lung]
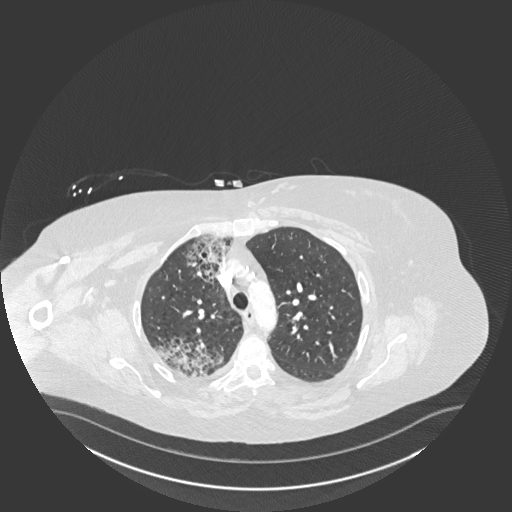
[im 307/410  mediastinal]
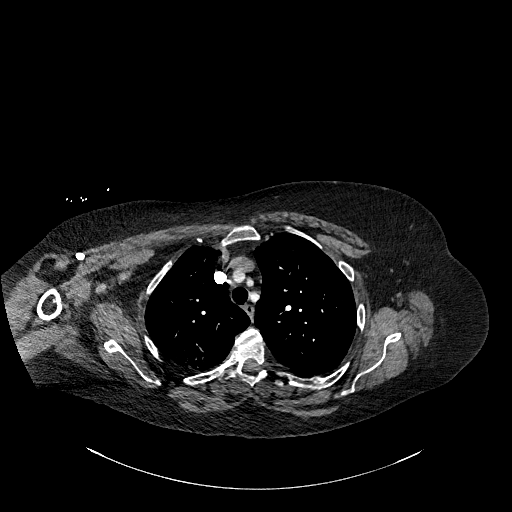
[im 328/410  lung]
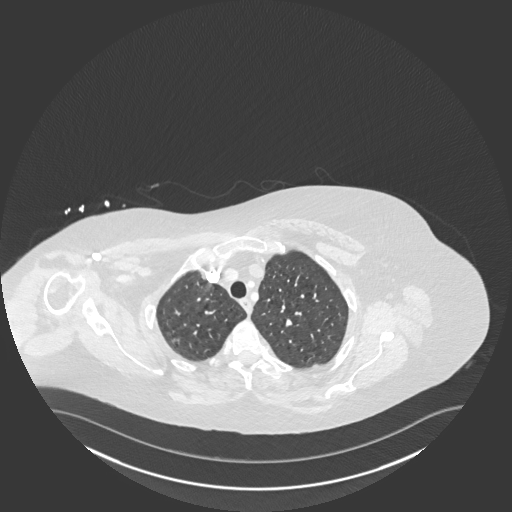
[im 348/410  mediastinal]
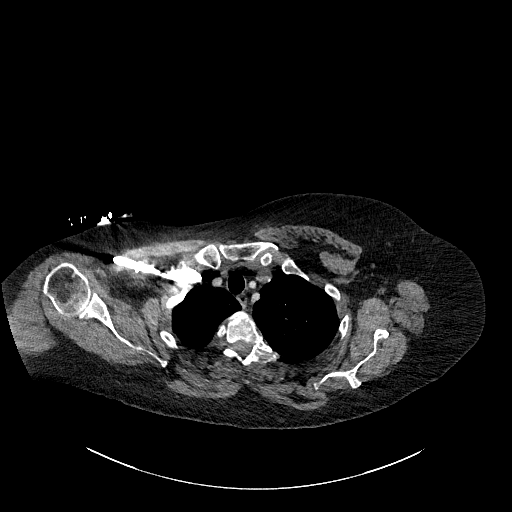
[im 369/410  lung]
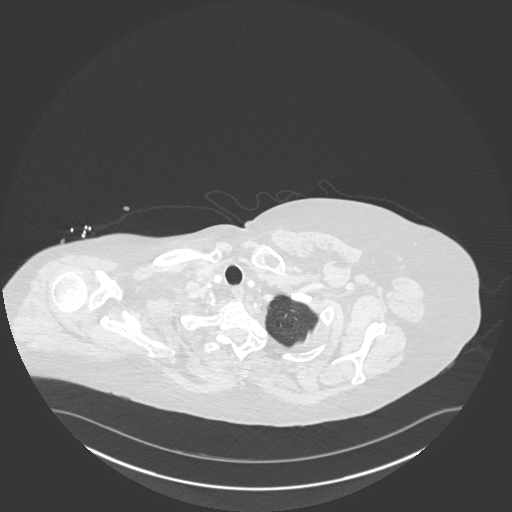
[im 389/410  mediastinal]
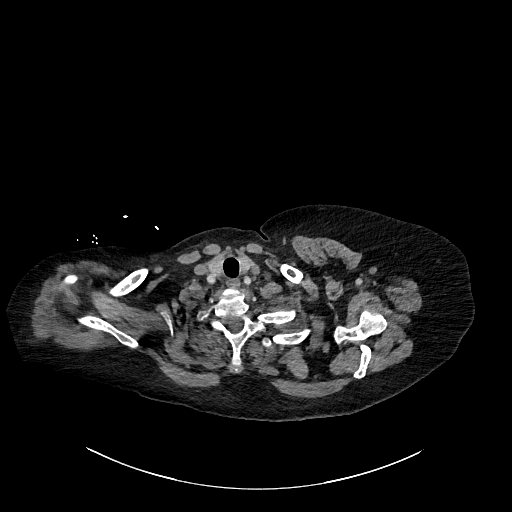

[Series 8: pe 2mm cor · coronal · 0.59mm/px · 1 of 151 slices shown]
[im 76/151  mediastinal]
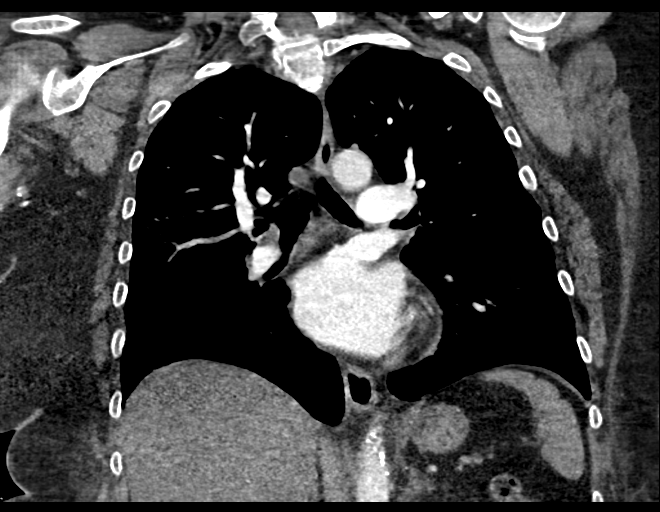

[19 of 36 positions shown; findings below may reference images not displayed]

FINDINGS: Cardiovascular: Satisfactory opacification of the pulmonary arteries
to the segmental level. No evidence of pulmonary embolism. Normal
heart size. No pericardial effusion. Moderate aortic
atherosclerosis. No aneurysm. No dissection.

Mediastinum/Nodes: Midline trachea. No thyroid mass. Esophagus
within normal limits. No suspicious adenopathy.

Lungs/Pleura: Emphysema. Ground-glass density and patchy
consolidation within the right greater than left upper lobe, and the
right middle lobe. Partial atelectasis or consolidation in the right
lower lobe. Trace left and small right pleural effusion. No
pneumothorax

Upper Abdomen: No acute abnormality.

Musculoskeletal: No chest wall abnormality. No acute or significant
osseous findings.

Review of the MIP images confirms the above findings.
IMPRESSION: 1. No CT evidence for acute pulmonary embolus.
2. Emphysema. Multifocal right greater than left ground-glass
densities and patchy consolidations probably representing pneumonia.
Small right and trace left pleural effusions.

Aortic Atherosclerosis ([36]-[36]).

## 2020-12-31 IMAGING — DX DG CHEST 1V PORT
1 series · 1 of 1 positions shown · non-contrast
Comparison: [DATE]

CLINICAL DATA: Shortness of breath

EXAM:
PORTABLE CHEST 1 VIEW

[chest]
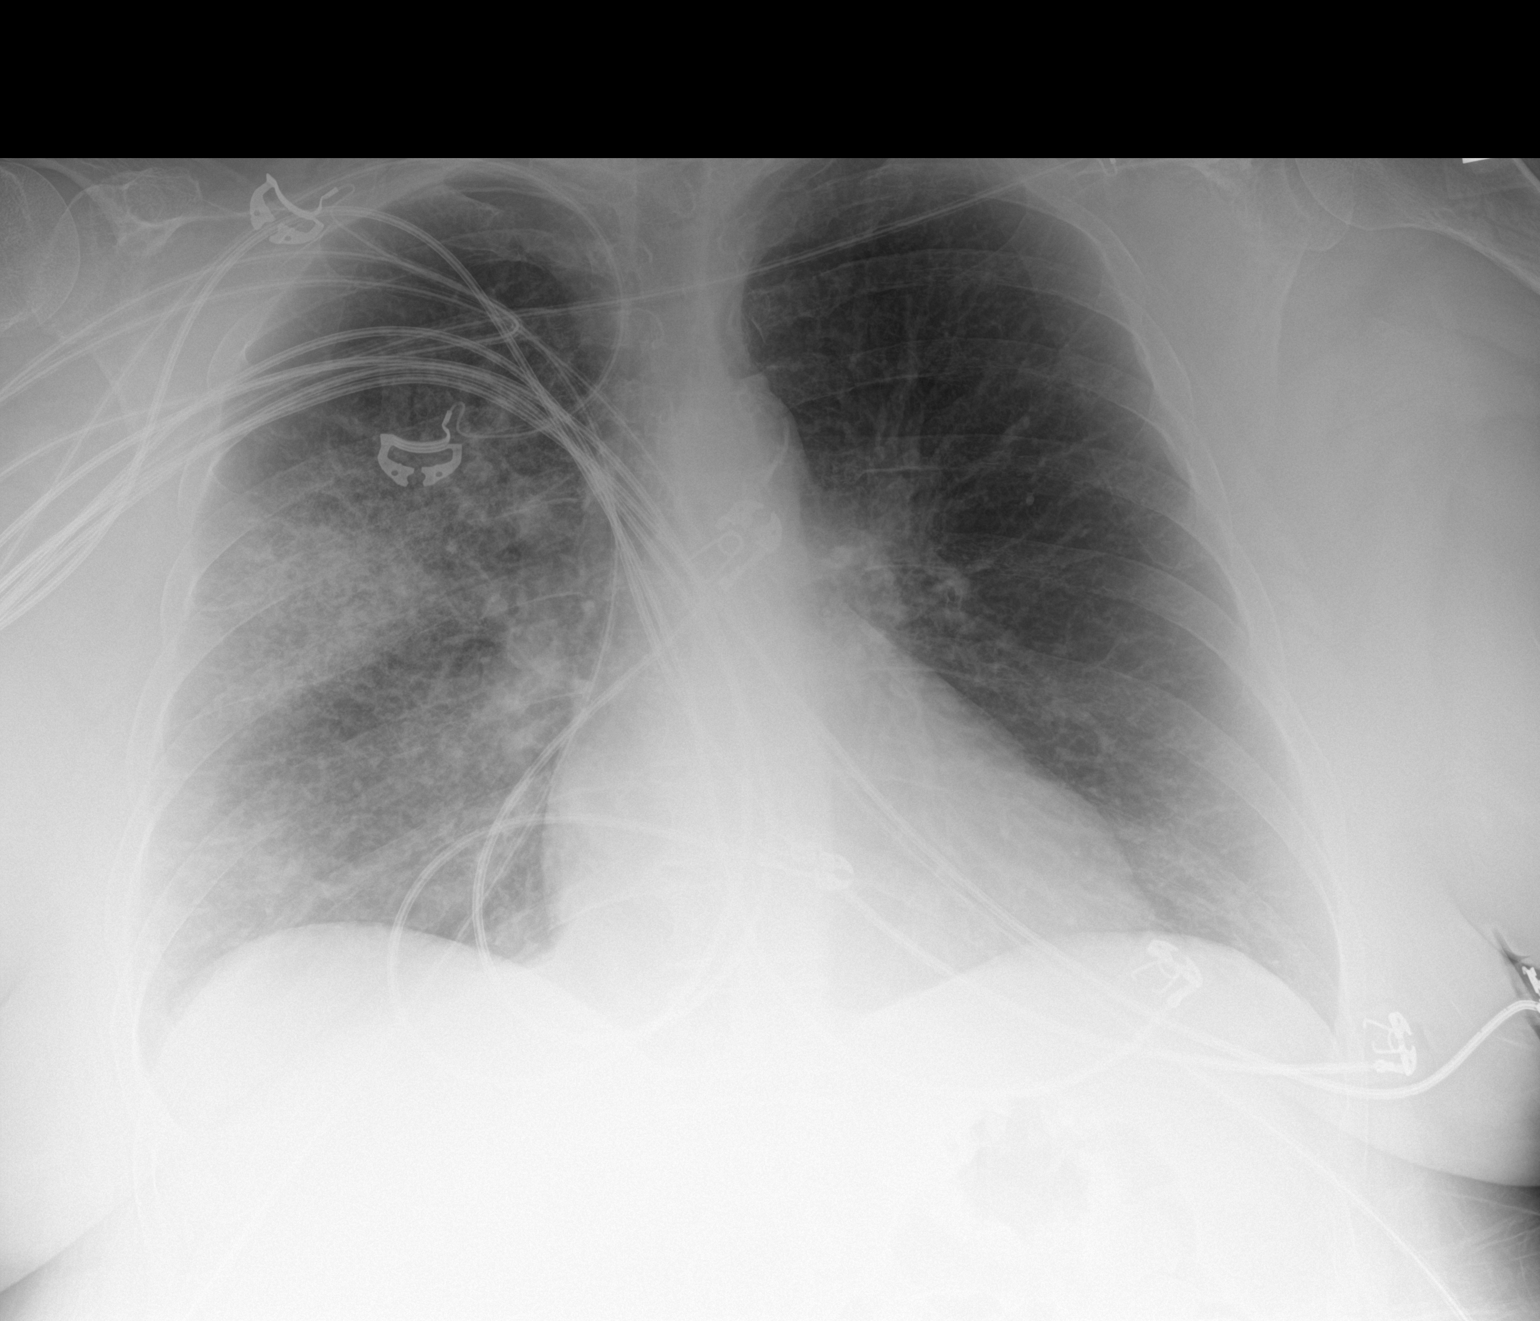

[1 of 1 positions shown; findings below may reference images not displayed]

FINDINGS: The heart size and mediastinal contours are within normal limits.
Heterogeneous airspace opacity of the right mid and lower lung. The
visualized skeletal structures are unremarkable.
IMPRESSION: Heterogeneous airspace opacity of the right mid and lower lung,
consistent with infection or aspiration.

## 2020-12-31 IMAGING — CT CT HEAD W/O CM
4 series · 15 of 47 positions shown, 17 images · non-contrast
Comparison: No priors.

CLINICAL DATA: 59-year-old female with history of mental status
change.

EXAM:
CT HEAD WITHOUT CONTRAST
TECHNIQUE: Contiguous axial images were obtained from the base of the skull
through the vertex without intravenous contrast.

[Series 3: head without · axial · non-contrast · 0.44mm/px · z∈[-96,+24]mm · 7 of 32 slices shown, 9 images]
[im 4/32  brain]
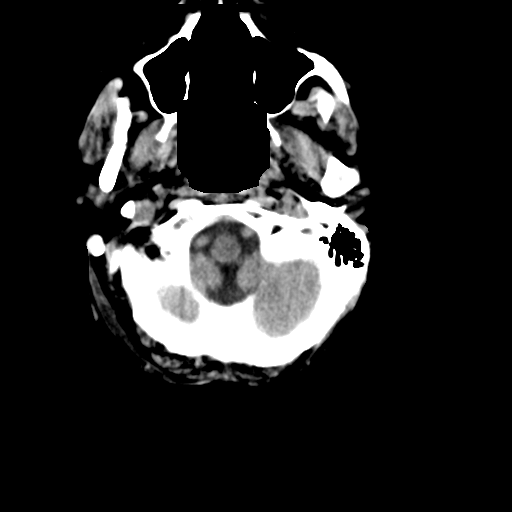
[im 4/32  bone]
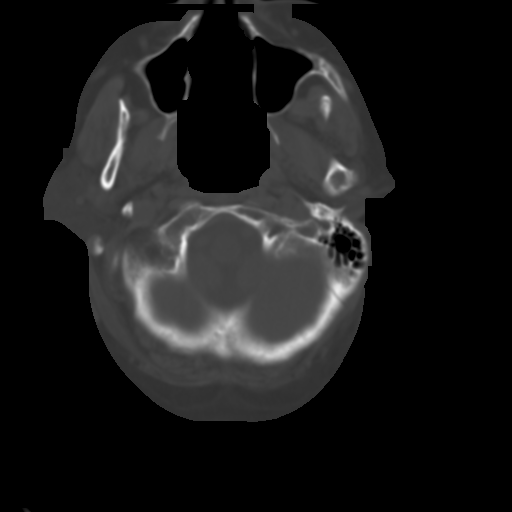
[im 8/32  brain]
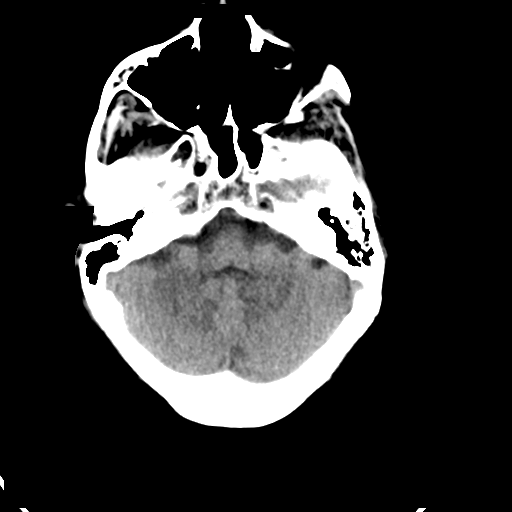
[im 12/32  brain]
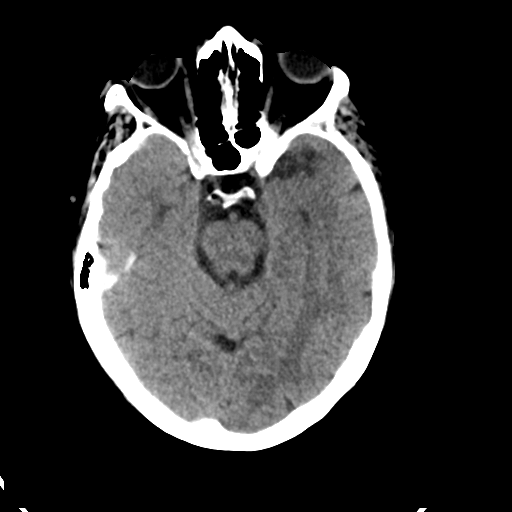
[im 16/32  brain]
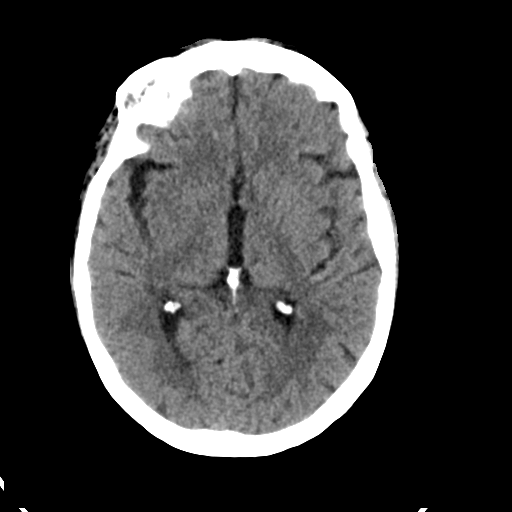
[im 20/32  brain]
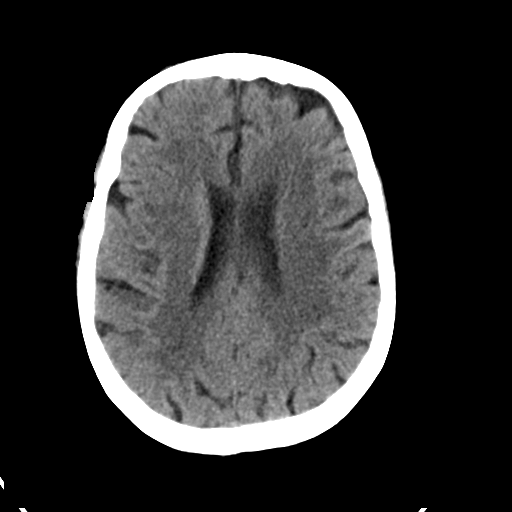
[im 20/32  bone]
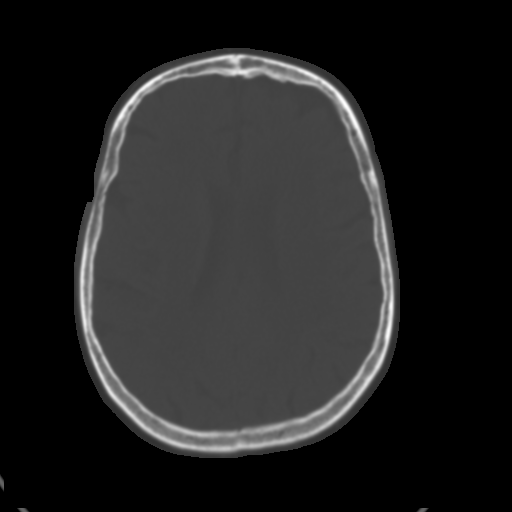
[im 24/32  brain]
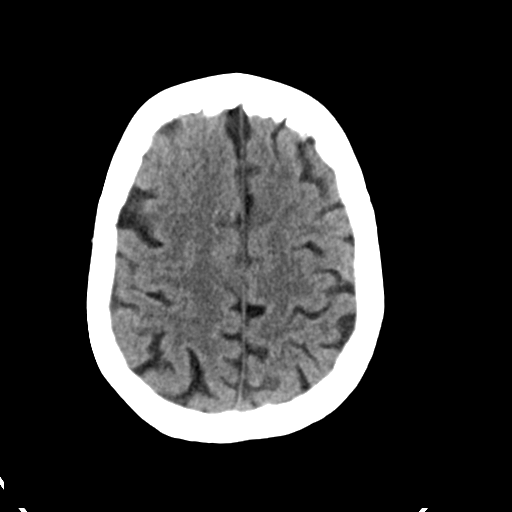
[im 28/32  brain]
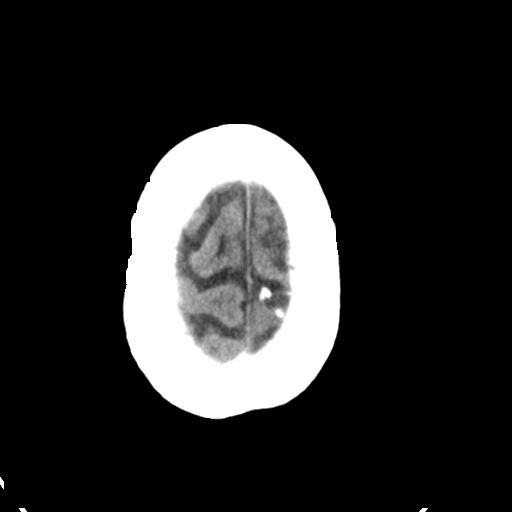

[Series 4: head bone · axial · 0.44mm/px · z∈[-97,-81]mm · 2 of 80 slices shown]
[im 8/80  bone]
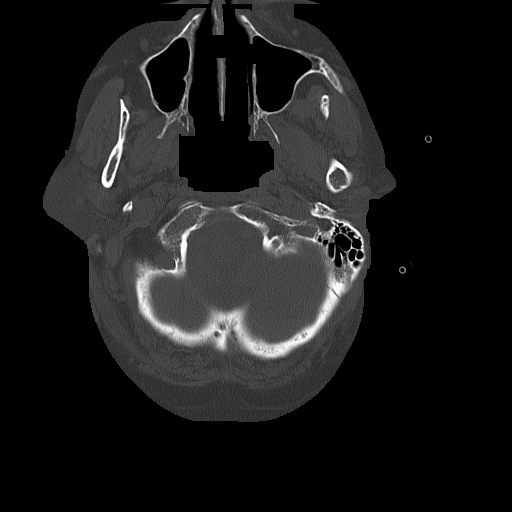
[im 16/80  bone]
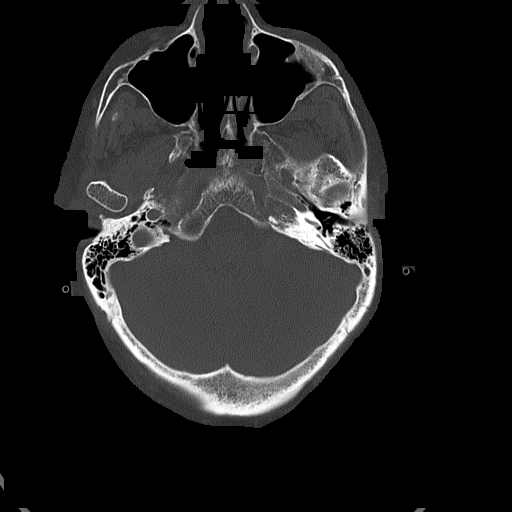

[Series 5: head without cor · coronal · non-contrast · 0.31mm/px · 3 of 71 slices shown]
[im 24/71  brain]
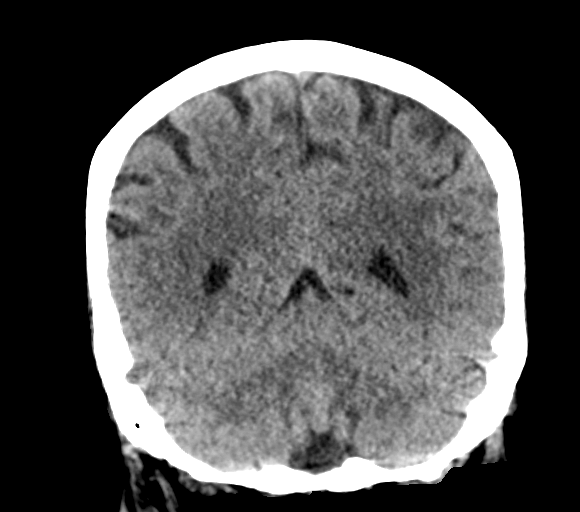
[im 32/71  brain]
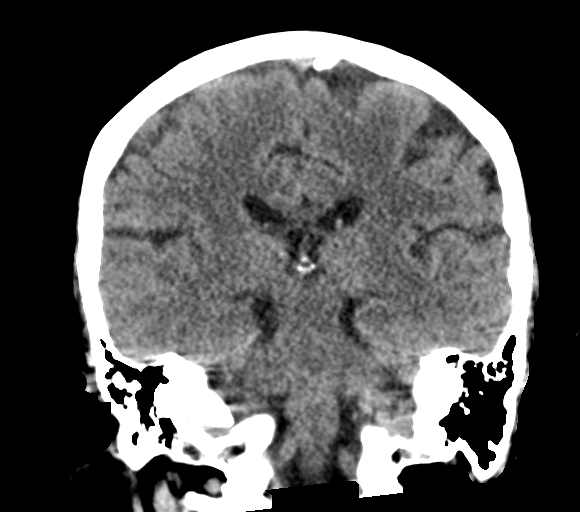
[im 39/71  brain]
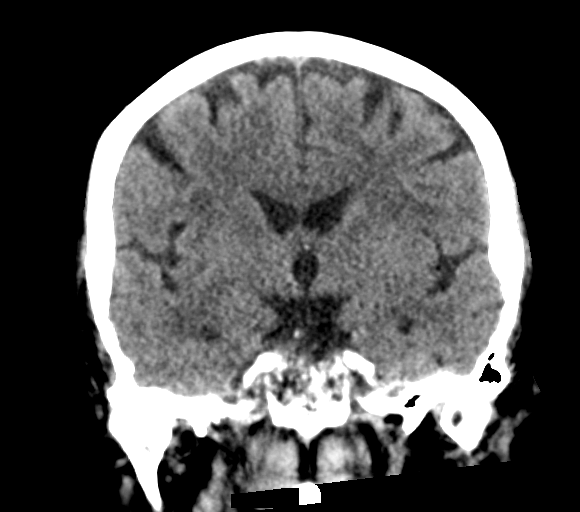

[Series 6: head without sag · sagittal · non-contrast · 0.31mm/px · 3 of 67 slices shown]
[im 24/67  brain]
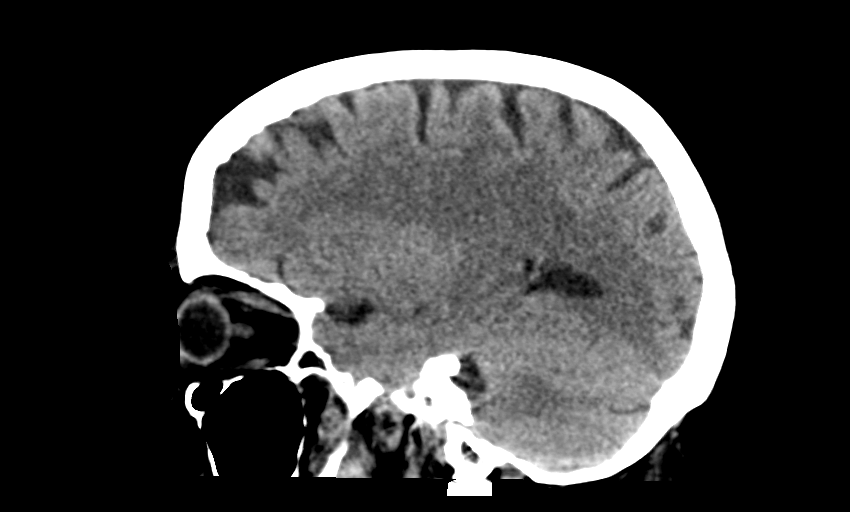
[im 34/67  brain]
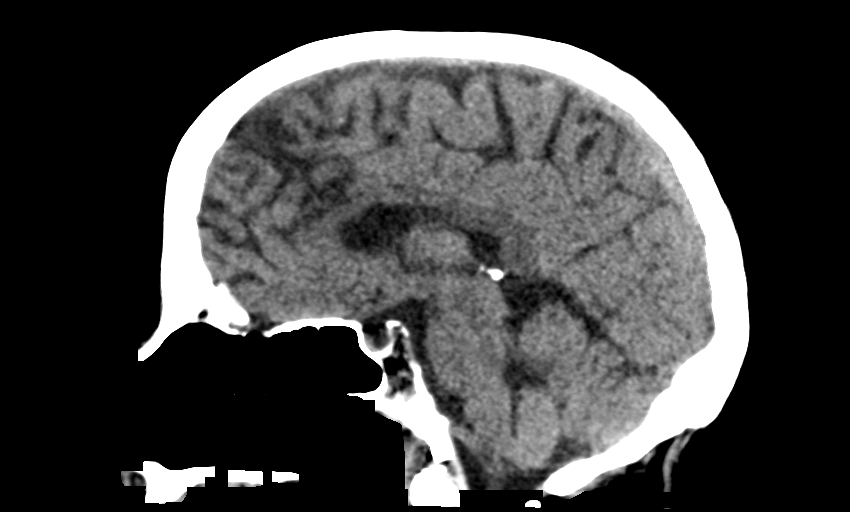
[im 44/67  brain]
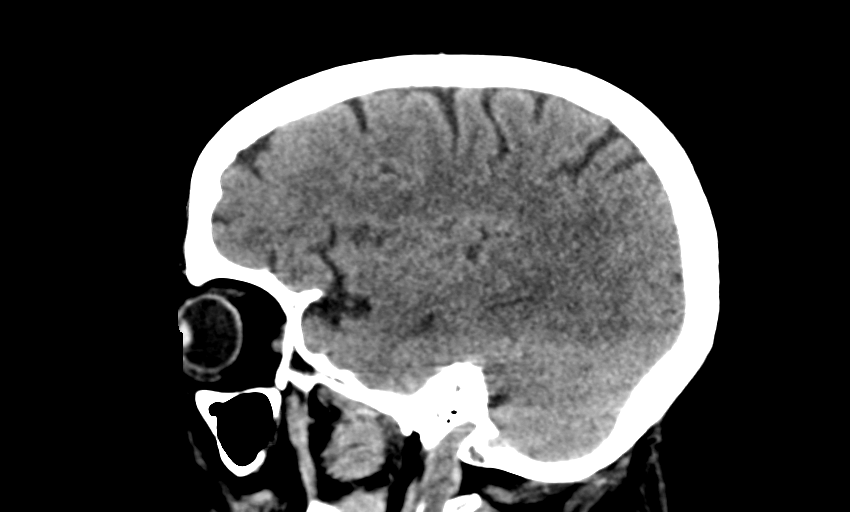

[15 of 47 positions shown; findings below may reference images not displayed]

FINDINGS: Brain: No evidence of acute infarction, hemorrhage, hydrocephalus,
extra-axial collection or mass lesion/mass effect.

Vascular: No hyperdense vessel or unexpected calcification.

Skull: Normal. Negative for fracture or focal lesion.

Sinuses/Orbits: No acute finding.

Other: None.
IMPRESSION: 1. No acute intracranial abnormalities. The appearance of the brain
is normal.

## 2020-12-31 MED ORDER — PANTOPRAZOLE SODIUM 40 MG PO TBEC
40.0000 mg | DELAYED_RELEASE_TABLET | Freq: Every day | ORAL | Status: DC
  Administered 2020-12-31 – 2021-01-03 (×4): 40 mg via ORAL
  Filled 2020-12-31 (×4): qty 1

## 2020-12-31 MED ORDER — POTASSIUM CHLORIDE CRYS ER 20 MEQ PO TBCR
40.0000 meq | EXTENDED_RELEASE_TABLET | ORAL | Status: AC
Start: 2020-12-31 — End: 2021-01-01
  Administered 2020-12-31 – 2021-01-01 (×2): 40 meq via ORAL
  Filled 2020-12-31 (×2): qty 2

## 2020-12-31 MED ORDER — METHYLPREDNISOLONE SODIUM SUCC 125 MG IJ SOLR
125.0000 mg | Freq: Once | INTRAMUSCULAR | Status: AC
  Administered 2020-12-31: 125 mg via INTRAVENOUS
  Filled 2020-12-31: qty 2

## 2020-12-31 MED ORDER — POTASSIUM CHLORIDE CRYS ER 20 MEQ PO TBCR
40.0000 meq | EXTENDED_RELEASE_TABLET | Freq: Once | ORAL | Status: AC
  Administered 2020-12-31: 40 meq via ORAL
  Filled 2020-12-31: qty 2

## 2020-12-31 MED ORDER — QUETIAPINE FUMARATE 200 MG PO TABS
200.0000 mg | ORAL_TABLET | Freq: Every day | ORAL | Status: DC
  Administered 2020-12-31: 200 mg via ORAL
  Filled 2020-12-31: qty 2
  Filled 2020-12-31 (×2): qty 1

## 2020-12-31 MED ORDER — VANCOMYCIN HCL 1250 MG/250ML IV SOLN: 1250.0000 mg | INTRAVENOUS | Status: DC

## 2020-12-31 MED ORDER — SODIUM CHLORIDE 0.9 % IV SOLN
2.0000 g | Freq: Three times a day (TID) | INTRAVENOUS | Status: DC
  Administered 2020-12-31 – 2021-01-01 (×2): 2 g via INTRAVENOUS
  Filled 2020-12-31 (×4): qty 2

## 2020-12-31 MED ORDER — ACETAMINOPHEN 325 MG PO TABS
650.0000 mg | ORAL_TABLET | Freq: Four times a day (QID) | ORAL | Status: DC | PRN
  Administered 2020-12-31 – 2021-01-03 (×4): 650 mg via ORAL
  Filled 2020-12-31 (×4): qty 2

## 2020-12-31 MED ORDER — OXYCODONE HCL 5 MG PO TABS
2.5000 mg | ORAL_TABLET | Freq: Four times a day (QID) | ORAL | Status: DC | PRN
  Administered 2020-12-31 – 2021-01-03 (×8): 2.5 mg via ORAL
  Filled 2020-12-31 (×8): qty 1

## 2020-12-31 MED ORDER — HYDROXYZINE HCL 25 MG PO TABS
50.0000 mg | ORAL_TABLET | Freq: Three times a day (TID) | ORAL | Status: DC | PRN
  Administered 2020-12-31 – 2021-01-02 (×4): 50 mg via ORAL
  Filled 2020-12-31 (×5): qty 2

## 2020-12-31 MED ORDER — VANCOMYCIN HCL 1500 MG/300ML IV SOLN
1500.0000 mg | Freq: Once | INTRAVENOUS | Status: AC
  Administered 2020-12-31: 1500 mg via INTRAVENOUS
  Filled 2020-12-31: qty 300

## 2020-12-31 MED ORDER — SERTRALINE HCL 100 MG PO TABS
100.0000 mg | ORAL_TABLET | Freq: Every day | ORAL | Status: DC
  Filled 2020-12-31: qty 1

## 2020-12-31 MED ORDER — AMLODIPINE BESYLATE 10 MG PO TABS
10.0000 mg | ORAL_TABLET | Freq: Every day | ORAL | Status: DC
  Administered 2020-12-31 – 2021-01-03 (×4): 10 mg via ORAL
  Filled 2020-12-31 (×4): qty 1

## 2020-12-31 MED ORDER — INSULIN ASPART 100 UNIT/ML IJ SOLN
0.0000 [IU] | Freq: Three times a day (TID) | INTRAMUSCULAR | Status: DC
  Administered 2021-01-01: 11 [IU] via SUBCUTANEOUS
  Administered 2021-01-02 (×3): 3 [IU] via SUBCUTANEOUS
  Administered 2021-01-03: 2 [IU] via SUBCUTANEOUS

## 2020-12-31 MED ORDER — ALBUTEROL SULFATE HFA 108 (90 BASE) MCG/ACT IN AERS
8.0000 | INHALATION_SPRAY | Freq: Once | RESPIRATORY_TRACT | Status: AC
  Administered 2020-12-31: 8 via RESPIRATORY_TRACT
  Filled 2020-12-31: qty 6.7

## 2020-12-31 MED ORDER — IOHEXOL 350 MG/ML SOLN
50.0000 mL | Freq: Once | INTRAVENOUS | Status: AC | PRN
  Administered 2020-12-31: 50 mL via INTRAVENOUS

## 2020-12-31 MED ORDER — SERTRALINE HCL 100 MG PO TABS
100.0000 mg | ORAL_TABLET | Freq: Every day | ORAL | Status: DC
  Administered 2021-01-01: 100 mg via ORAL
  Filled 2020-12-31 (×2): qty 1

## 2020-12-31 MED ORDER — ENOXAPARIN SODIUM 40 MG/0.4ML IJ SOSY
40.0000 mg | PREFILLED_SYRINGE | INTRAMUSCULAR | Status: DC
  Administered 2021-01-01 – 2021-01-02 (×2): 40 mg via SUBCUTANEOUS
  Filled 2020-12-31 (×3): qty 0.4

## 2020-12-31 MED ORDER — SODIUM CHLORIDE 0.9 % IV SOLN
2.0000 g | Freq: Once | INTRAVENOUS | Status: AC
  Administered 2020-12-31: 2 g via INTRAVENOUS
  Filled 2020-12-31: qty 2

## 2020-12-31 MED ORDER — ATORVASTATIN CALCIUM 40 MG PO TABS
40.0000 mg | ORAL_TABLET | Freq: Every day | ORAL | Status: DC
  Administered 2020-12-31 – 2021-01-03 (×4): 40 mg via ORAL
  Filled 2020-12-31 (×4): qty 1

## 2020-12-31 MED ORDER — MAGNESIUM SULFATE 2 GM/50ML IV SOLN
2.0000 g | INTRAVENOUS | Status: AC
  Administered 2020-12-31: 2 g via INTRAVENOUS
  Filled 2020-12-31: qty 50

## 2020-12-31 MED ORDER — ACETAMINOPHEN 650 MG RE SUPP: 650.0000 mg | Freq: Four times a day (QID) | RECTAL | Status: DC | PRN

## 2020-12-31 NOTE — ED Notes (Signed)
Patient transported to CT 

## 2020-12-31 NOTE — ED Notes (Signed)
Pt SpO2 decreased to 88% on 2L via nasal cannula. RN increased O2 to 4L/min

## 2020-12-31 NOTE — Progress Notes (Signed)
FPTS Interim Progress Note  S:  Went to see patient after receiving call from nurse regarding K of 2.7.  Paient resting comfortably but indicates is feeling anxious.  Indicates is breathing well on RA.  O: BP (!) 150/62 (BP Location: Right Arm)   Pulse (!) 101   Temp 99.5 F (37.5 C) (Oral)   Resp 19   SpO2 96%    Physical Exam Constitutional:      Appearance: Normal appearance.  HENT:     Head: Normocephalic and atraumatic.     Mouth/Throat:     Mouth: Mucous membranes are moist.  Cardiovascular:     Rate and Rhythm: Normal rate and regular rhythm.     Pulses: Normal pulses.  Pulmonary:     Effort: Pulmonary effort is normal.     Breath sounds: Normal breath sounds.     Comments: Exam limited due to body habitus Skin:    General: Skin is warm.  Neurological:     Mental Status: She is alert.     A/P: Hypokalemia K-2.7.  Patient has not received any Insulin. - KDurr x2 with repeat BMP and Mg in AM.  Anxiety - Restarted Vistrail 50 mg TID PRN    Jovita Kussmaul, MD 12/31/2020, 9:52 PM PGY-1, [facility] pager [phone]

## 2020-12-31 NOTE — ED Notes (Signed)
Pt removed O2 nasal cannula, spO2 sat 87% on RA. RN reapplied O2, pt now on 2L via nasal cannula

## 2020-12-31 NOTE — Progress Notes (Signed)
Right lower extremity venous duplex completed. Refer to "CV Proc" under chart review to view preliminary results.  12/31/2020 1:09 PM Eula Fried., MHA, RVT, RDCS, RDMS

## 2020-12-31 NOTE — Progress Notes (Signed)
Date and time results received: 12/31/20 1928  Test: Lactic Acid / Potassium Critical Value: LA 2.0 K 2.7  Name of Provider Notified: Family medicine  Orders Received: Md will be reporting at bedside to assess pt and give orders.

## 2020-12-31 NOTE — Progress Notes (Signed)
ABG ordered and obtained.  Patient currently on 2L [place].     Ref. Range 12/31/2020 10:56  Sample type Unknown ARTERIAL  pH, Arterial Latest Ref Range: 7.350 - 7.450  7.678 (HH)  pCO2 arterial Latest Ref Range: 32.0 - 48.0 mmHg 26.7 (L)  pO2, Arterial Latest Ref Range: 83.0 - 108.0 mmHg 127 (H)  TCO2 Latest Ref Range: 22 - 32 mmol/L 32  Acid-Base Excess Latest Ref Range: 0.0 - 2.0 mmol/L 10.0 (H)  Bicarbonate Latest Ref Range: 20.0 - 28.0 mmol/L 31.3 (H)  O2 Saturation Latest Units: % 100.0  Patient temperature Unknown 98.5 F  Collection site Unknown Radial

## 2020-12-31 NOTE — Progress Notes (Signed)
Pharmacy Antibiotic Note  [name] is a 60 y.o. female admitted on 12/31/2020 with pneumonia.  Pharmacy has been consulted for vancomycin dosing.  Cefepime per MD  Plan: Vancomycin 1500 mg IV x 1, then 1250 mg IV q24h (eAUC 483, Goal AUC 400-550, SCr used 0.8) Add MRSA PCR Monitor renal function, Cx/PCR to narrow Vancomycin levels as needed      Temp (24hrs), Avg:98.5 F (36.9 C), Min:98.5 F (36.9 C), Max:98.5 F (36.9 C)  Recent Labs  Lab 12/25/20 0312 12/31/20 1008  WBC 14.4* 11.9*  CREATININE 0.83 0.74    Estimated Creatinine Clearance: 81.1 mL/min (by C-G formula based on SCr of 0.74 mg/dL).    Allergies  Allergen Reactions  . Amoxicillin     Yeast infection     Daylene Posey, PharmD Clinical Pharmacist ED Pharmacist Phone # [phone] 12/31/2020 2:58 PM

## 2020-12-31 NOTE — Progress Notes (Signed)
Pt received in room 23 (2 W) at shift change. Alert and oriented x 4. O2 at 4 liters [place]. Telemetry applied and called in. Vitals taken. Pt made comfortable. Report given to Eilleen Kempf.

## 2020-12-31 NOTE — H&P (Addendum)
Family Medicine Teaching [facility] Admission History and Physical Service Pager: [phone]  Patient name: [name] Medical record number: [id number] Date of birth: [birth year] Age: 60 y.o. Gender: female  Primary Care Provider: Arvilla Market, DO Consultants: None  Code Status: DNR  Preferred Emergency Contact: Louie Casa   Chief Complaint: Worsening SOB and LE pain   Assessment and Plan: [name] is a 60 y.o. female presenting with dyspnea . PMH is significant for AVN s/p R total hip arthroplasty, alcohol abuse, cirrhosis, COPD, nicotine dependence.   Acute hypoxic hypocapnic respiratory failure 2/2 Pneumonia  Worsening SOB since hip replacement surgery a week ago with concern for altered mental status and hallucinations. On presentation patient A&O x4 without hallucinations. Tachypnic to 25 bpm, rectal temp of 101. HR and BP wnl. Satting 94% on 4L [place] (no O2 requirement at baseline). Lung sounds significant for mild diffuse crackles, and wheezing in upper lobes bilaterally. BNP elevated at 358.3. WBC elevated at 11.9.  CXR significant for heterogeneous airspace opacity of the right mid and lower lung, consistent with infection or aspiration. UA negative. CT head negative. EKG NSR. In ED was started on Vancomycin and Cefepime. Received methylprednisolone. Differential includes PE given her recent surgery, wells score 4.5 indicating moderate risk. Also consider COPD exacerbation given her history of COPD, and wheezing on exam, though she denies cough and sputum production. Dyspnea potentially due to heart failure given elevated BNP. Considered continuing home lasix but held due to low potassium. Will order echo. Admitting patient for further work up of respiratory failure.  - admit to FPTS, attending Dr. Manson Passey - vitals per floor - cardiac telemetry - Vancomycin (5/30-) - Cefepime (5/30-)  - de-escalate abx tomorrow  - f/u blood cx  - D-dimer, lactic acid, procalcitonin  -  CTA  - f/u echo  - am BMP, CBC  - add home inhalers - low threshold if pt worsens to order Trop, and repeat ABG  - PT/OT    Respiratory alkalosis ABG with pH 7.678, pCO2 26.7, pO2 127. Unclear cause, potentially due to PE vs anxiety, medication side effect, neurologic cause. Wells score Tachypneic and hypoxic, requiring 4LNC - continue to monitor respiratory status   - if patient's status declines, low threshold to repeat ABG    Bilateral lower extremity pain Endorses worsening LE pain today. Endorses LE edema chronically.  DVT US negative though limited.   Hypokalemia  K+ 2.9. Repleted with IV K+ in the ED - repeat afternoon BMP  - am BMP - replete as necessary   DM2 Glucose 191 on admission. Denies taking any medication - f/u A1c - mSSI - CBGs with meals and at bedtime   AVN s/p Right total hip arthroplasty on 5/23 - Tylenol 650mg  q 6prn  - Oxycodone 2.5mg  q 6 prn   Normocytic Anemia  Hgb 8.3. Likely due to blood loss from recent surgery. Transfusion threshold 8 - continue to monitor - am CBC   Hx of liver cirrhosis  Hx alcohol abuse AST/ALT wnl. Denies current alcohol use, stating last drank alcohol about 3-4 years ago  Anxiety and Depression Home medication: Zoloft 100mg  daily, Seroquel 200mg  daily at bedtime - continue home medication   Nicotine dependence Reports smoking .5ppd   - counsel on nicotine cessation   Protein calorie malnutrition Albumin 2.7, total protein 6.1 likely in the setting of low dietary intake  - consider ensure shakes for supplementation and MVI    FEN/GI: carb modified  Prophylaxis: Lovenox  Disposition: medical telemetry   History of Present Illness:  [name] is a 60 y.o. female presenting with dyspnea and change in mental status  Patient's mom called EMS because of concern for hallucinations after her hip replacement. When she gets up to walk has bad anxiety and refuses to walk. She states today she has increased  lower leg pain since her surgery, but has worsened today. Endorses R and L leg swelling since November of last year. Has also become short of breath, especially when walking. Denies oxygen use at home. After using the restroom she would become short of breath. Denies oxygen use at home. Patient states she wasn't hallucinating but was dreaming while walking.   Endorses SOB, headaches, states hasnt eaten anythign in 2 days. Denies cough. Denies CP, endorses stomach pain. Denies dysuria, constipation, diarrhea.   Denies alcohol, last drink 3-4 years ago Endorses cigarette use (10 per day) No recreation drug use   Last took meds this morning. Gives herself meds and takes care of ADLs. Lives with her parents   ED course S/p KCl and Mag sulfate 2g Received albuterol and methylpred x1  Started on Vanc and Cefepime   Review Of Systems: Per HPI   Patient Active Problem List   Diagnosis Date Noted  . History of total hip arthroplasty, right 12/24/2020  . Type 2 diabetes mellitus (HCC) 12/18/2020  . Avascular necrosis of bone of right hip (HCC) 09/17/2020  . Greater trochanteric pain syndrome of left lower extremity 07/24/2020  . Rheumatoid factor positive 06/26/2020  . CRP elevated 06/26/2020  . Bilateral hand pain 06/26/2020  . Stasis dermatitis of both legs 06/26/2020  . Alcohol use disorder, severe, in sustained remission (HCC) 04/30/2020  . Chronic sinusitis 04/10/2020  . Liver cirrhosis (HCC) 03/08/2020  . Major depressive disorder, recurrent episode, moderate with anxious distress (HCC) 02/11/2020  . Alcohol abuse, in remission 12/16/2019  . Nicotine dependence, cigarettes, uncomplicated 12/16/2019  . Generalized anxiety disorder 12/16/2019    Past Medical History: Past Medical History:  Diagnosis Date  . Anxiety   . Arthritis   . COPD (chronic obstructive pulmonary disease) (HCC)   . Depression   . Dyspnea   . GERD (gastroesophageal reflux disease)   . Hypertension    . Peripheral edema    2+ at PAT visit    Past Surgical History: Past Surgical History:  Procedure Laterality Date  . ABDOMINAL HYSTERECTOMY  1986  . CARPAL TUNNEL RELEASE Bilateral 1980   Per patient  . SHOULDER SURGERY Left 1980  . 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   Procedure: RIGHT TOTAL HIP ARTHROPLASTY ANTERIOR APPROACH;  Surgeon: Gean Birchwood, MD;  Location: [facility];  Service: Orthopedics;  Laterality: Right;    Social History: Social History   Tobacco Use  . Smoking status: Current Every Day Smoker    Packs/day: 1.00    Years: 25.00    Pack years: 25.00    Types: Cigarettes    Start date: 08/04/1978  . Smokeless tobacco: Never Used  Vaping Use  . Vaping Use: Never used  Substance Use Topics  . Alcohol use: Not Currently  . Drug use: Never    Family History: Family History  Problem Relation Age of Onset  . Ulcers Father   . Hypotension Sister   . Hypertension Brother     Allergies and Medications: Allergies  Allergen Reactions  . Amoxicillin     Yeast infection    No current facility-administered medications on file prior to  encounter.   Current Outpatient Medications on File Prior to Encounter  Medication Sig Dispense Refill  . amLODipine (NORVASC) 10 MG tablet TAKE 1 TABLET BY MOUTH DAILY. (Patient taking differently: Take 10 mg by mouth daily.) 90 tablet 2  . aspirin EC 81 MG tablet Take 1 tablet (81 mg total) by mouth 2 (two) times daily. 60 tablet 0  . atorvastatin (LIPITOR) 40 MG tablet Take 1 tablet (40 mg total) by mouth daily. 90 tablet 3  . budesonide-formoterol (SYMBICORT) 160-4.5 MCG/ACT inhaler Inhale 2 puffs into the lungs 2 (two) times daily.    . Doxylamine-DM (VICKS DAYQUIL/NYQUIL COUGH PO) Take 1 Dose by mouth daily as needed (cough).    . folic acid (FOLVITE) 1 MG tablet TAKE 1 TABLET (1 MG TOTAL) BY MOUTH DAILY. (Patient taking differently: Take 1 mg by mouth daily.) 90 tablet 0  . furosemide (LASIX) 20 MG tablet TAKE 1 TABLET BY  MOUTH DAILY (Patient taking differently: Take 20 mg by mouth daily.) 90 tablet 1  . guaiFENesin (MUCINEX) 600 MG 12 hr tablet Take 600 mg by mouth 2 (two) times daily.    . hydrOXYzine (ATARAX/VISTARIL) 50 MG tablet TAKE 1 TABLET (50 MG TOTAL) BY MOUTH 3 (THREE) TIMES DAILY AS NEEDED FOR ANXIETY. (Patient taking differently: Take 50 mg by mouth every 8 (eight) hours as needed for anxiety.) 90 tablet 2  . Ketotifen Fumarate (ALLERGY EYE DROPS OP) Place 1 drop into both eyes daily as needed (allergies).    . Menthol (ICY HOT) 5 % PTCH Apply 1 patch topically daily as needed (hip pain).    . Multiple Vitamin (MULTIVITAMIN WITH MINERALS) TABS tablet Take 1 tablet by mouth daily.    Marland Kitchen oxybutynin (DITROPAN-XL) 10 MG 24 hr tablet TAKE 1 TABLET (10 MG TOTAL) BY MOUTH AT BEDTIME. (Patient taking differently: Take 10 mg by mouth at bedtime.) 30 tablet 2  . oxyCODONE-acetaminophen (PERCOCET/ROXICET) 5-325 MG tablet Take 1 tablet by mouth every 4 (four) hours as needed for severe pain. 30 tablet 0  . pantoprazole (PROTONIX) 40 MG tablet Take 1 tablet (40 mg total) by mouth 2 (two) times daily. 60 tablet 5  . POTASSIUM PO Take 1 tablet by mouth daily.    . QUEtiapine (SEROQUEL) 200 MG tablet TAKE 1 TABLET (200 MG TOTAL) BY MOUTH AT BEDTIME. (Patient taking differently: Take 200 mg by mouth at bedtime.) 90 tablet 2  . sertraline (ZOLOFT) 100 MG tablet TAKE 1 TABLET (100 MG TOTAL) BY MOUTH DAILY WITH BREAKFAST. (Patient taking differently: Take 100 mg by mouth daily.) 30 tablet 2  . tiZANidine (ZANAFLEX) 2 MG tablet Take 1 tablet (2 mg total) by mouth every 6 (six) hours as needed. (Patient taking differently: Take 2 mg by mouth every 6 (six) hours as needed for muscle spasms.) 60 tablet 0  . VITAMIN D PO Take 1 capsule by mouth daily.    Marland Kitchen amoxicillin-clavulanate (AUGMENTIN) 875-125 MG tablet TAKE 1 TABLET BY MOUTH 2 TIMES DAILY FOR 10 DAYS. (Patient not taking: No sig reported) 20 tablet 0  .  Fluticasone-Umeclidin-Vilant (TRELEGY ELLIPTA) 100-62.5-25 MCG/INH AEPB Inhale 1 puff into the lungs daily. 28 each 5    Objective: BP 114/78   Pulse 85   Temp 98.5 F (36.9 C) (Oral)   Resp 19   SpO2 94%   Physical Exam Constitutional:      General: She is not in acute distress.    Appearance: Normal appearance. She is obese.  HENT:  Head: Normocephalic and atraumatic.     Mouth/Throat:     Mouth: Mucous membranes are dry.     Pharynx: Oropharynx is clear.     Comments: Dental caries. Missing majority of teeth  Eyes:     Conjunctiva/sclera: Conjunctivae normal.     Pupils: Pupils are equal, round, and reactive to light.  Cardiovascular:     Rate and Rhythm: Normal rate and regular rhythm.     Pulses: Normal pulses.     Heart sounds: Normal heart sounds.  Pulmonary:     Breath sounds: Wheezing and rhonchi present.  Abdominal:     General: There is no distension.     Palpations: Abdomen is soft.     Tenderness: There is no abdominal tenderness.  Musculoskeletal:        General: Normal range of motion.     Cervical back: Normal range of motion and neck supple.  Skin:    General: Skin is warm and dry.  Neurological:     Mental Status: She is oriented to person, place, and time.  Psychiatric:        Mood and Affect: Mood normal.        Behavior: Behavior normal.        Thought Content: Thought content normal.        Judgment: Judgment normal.     Labs and Imaging: CBC BMET  Recent Labs  Lab 12/31/20 1008 12/31/20 1056  WBC 11.9*  --   HGB 8.3* 7.8*  HCT 25.5* 23.0*  PLT 434*  --    Recent Labs  Lab 12/31/20 1008 12/31/20 1056  NA 139 132*  K 2.9* 2.1*  CL 98  --   CO2 29  --   BUN 7  --   CREATININE 0.74  --   GLUCOSE 191*  --   CALCIUM 8.3*  --      EKG: NSR rate 75bpm   CT Head Wo Contrast  Result Date: 12/31/2020 CLINICAL DATA:  60 year old female with history of mental status change. EXAM: CT HEAD WITHOUT CONTRAST TECHNIQUE: Contiguous  axial images were obtained from the base of the skull through the vertex without intravenous contrast. COMPARISON:  No priors. FINDINGS: Brain: No evidence of acute infarction, hemorrhage, hydrocephalus, extra-axial collection or mass lesion/mass effect. Vascular: No hyperdense vessel or unexpected calcification. Skull: Normal. Negative for fracture or focal lesion. Sinuses/Orbits: No acute finding. Other: None. IMPRESSION: 1. No acute intracranial abnormalities. The appearance of the brain is normal. Electronically Signed   By: Trudie Reed M.D.   On: 12/31/2020 13:17   DG Chest Port 1 View  Result Date: 12/31/2020 CLINICAL DATA:  Shortness of breath EXAM: PORTABLE CHEST 1 VIEW COMPARISON:  12/17/2020 FINDINGS: The heart size and mediastinal contours are within normal limits. Heterogeneous airspace opacity of the right mid and lower lung. The visualized skeletal structures are unremarkable. IMPRESSION: Heterogeneous airspace opacity of the right mid and lower lung, consistent with infection or aspiration. Electronically Signed   By: Lauralyn Primes M.D.   On: 12/31/2020 15:06   VAS [place] LOWER EXTREMITY VENOUS (DVT) (ONLY [facility] & [facility])  Result Date: 12/31/2020  Lower Venous DVT Study Patient Name:  [name]  Date of Exam:   12/31/2020 Medical Rec #: [id number]     Accession #:    [id number] Date of Birth: 1960/09/11     Patient Gender: F Patient Age:   59Y Exam Location:  [facility] Procedure:      VAS  US LOWER EXTREMITY VENOUS (DVT) Referring Phys: [id number] ABIGAIL HARRIS --------------------------------------------------------------------------------  Indications: S/p right hip replacement x1 week, pitting edema.  Limitations: Body habitus, poor ultrasound/tissue interface and patient position. Comparison Study: No prior study Performing Technologist: Gertie FeyMichelle Simonetti MHA, RDMS, RVT, RDCS  Examination Guidelines: A complete evaluation includes B-mode imaging, spectral Doppler, color Doppler, and power  Doppler as needed of all accessible portions of each vessel. Bilateral testing is considered an integral part of a complete examination. Limited examinations for reoccurring indications may be performed as noted. The reflux portion of the exam is performed with the patient in reverse Trendelenburg.  +---------+---------------+---------+-----------+----------+--------------+ RIGHT    CompressibilityPhasicitySpontaneityPropertiesThrombus Aging +---------+---------------+---------+-----------+----------+--------------+ CFV      Full           Yes      Yes                                 +---------+---------------+---------+-----------+----------+--------------+ SFJ      Full                                                        +---------+---------------+---------+-----------+----------+--------------+ FV Prox  Full                                                        +---------+---------------+---------+-----------+----------+--------------+ FV Mid   Full                                                        +---------+---------------+---------+-----------+----------+--------------+ FV DistalFull                                                        +---------+---------------+---------+-----------+----------+--------------+ PFV      Full                                                        +---------+---------------+---------+-----------+----------+--------------+ POP      Full           Yes      Yes                                 +---------+---------------+---------+-----------+----------+--------------+ PTV      Full                                                        +---------+---------------+---------+-----------+----------+--------------+   Right Technical  Findings: Not visualized segments include peroneal veins.  Left Technical Findings: Not visualized segments include CFV.   Summary: RIGHT: - There is no evidence of deep vein thrombosis  in the lower extremity. However, portions of this examination were limited- see technologist comments above.  - No cystic structure found in the popliteal fossa.   *See table(s) above for measurements and observations.    Preliminary     Cora Collum, DO 12/31/2020, 3:18 PM PGY-1, [place] [facility] FPTS Intern pager: [phone], text pages welcome  FPTS Upper-Level Resident Addendum   I have independently interviewed and examined the patient. I have discussed the above with the original author and agree with their documentation.  Please see also any attending notes.    Dana Allan MD PGY-2, [facility] Family [facility] 01/01/2021 7:20 AM  FPTS Service pager: [phone] (text pages welcome through [facility])

## 2020-12-31 NOTE — ED Triage Notes (Addendum)
Patient arrives with c/o of worsening shortness of breath (chronic, but worse than her baseline) and limited mobility after her recent right hip replacement 1 week ago. Patient arrives with oxygen saturations at 89% on RA. Placed on 2L of o2 . Saturations improved to 96%.   Patient also reports worsening urinary incontinence as well.

## 2020-12-31 NOTE — ED Provider Notes (Signed)
[facility]   CSN: [id number] Arrival date & time: 12/31/20  1000     History No chief complaint on file.   [name] is a 60 y.o. female who presents to the emergency department with change in mental status.  The patient is having a lot of difficulty relaying an articulate history.  History is gathered from the patient but predominantly from her mother who is [phone] by telephone.  According to the patient's mother she had right hip surgery about 1 week ago.  She states that since that time she has been acting very strangely.  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 Patient's mother states she has not had any alcohol in over 2 years since she has lived with her.  Mother states that every time she has to stand up she "just goes to pieces."  She states that she is having overwhelming episodes of anxiety and tearfulness.  She states that she is afraid she is going to fall all the time.  The patient relates this same story.  She also has become markedly short of breath as well.  Her mother states that anytime she does get up to go to the bedside commode by the time she gets back she is huffing and puffing but on sure if this is due to her increased shortness of breath for her bizarre behavior and severe anxiety since surgery.  Her mother also states that she has been hallucinating but that "she was getting on my nerves so bad this morning I just cannot remember what it was."  So she is unable to give me specifics.  She does state that she is never acted like this before.  Patient states that she is afraid she is going to fall and then her parents cannot help her because they are elderly.  Her mother tells me that she has been urinating her on herself which is abnormal.  The patient has noted that her right leg is very  swollen.  She arrives with hypoxia at 89% on room air and is placed on 2 L.  HPI     Past Medical History:  Diagnosis Date  . Anxiety   . Arthritis   . COPD (chronic obstructive pulmonary disease) (HCC)   . Depression   . Dyspnea   . GERD (gastroesophageal reflux disease)   . Hypertension   . Peripheral edema    2+ at PAT visit    Patient Active Problem List   Diagnosis Date Noted  . Pneumonia 12/31/2020  . History of total hip arthroplasty, right 12/24/2020  . Type 2 diabetes mellitus (HCC) 12/18/2020  . Avascular necrosis of bone of right hip (HCC) 09/17/2020  . Greater trochanteric pain syndrome of left lower extremity 07/24/2020  . Rheumatoid factor positive 06/26/2020  . CRP elevated 06/26/2020  . Bilateral hand pain 06/26/2020  . Stasis dermatitis of both legs 06/26/2020  . Alcohol use disorder, severe, in sustained remission (HCC) 04/30/2020  . Chronic sinusitis 04/10/2020  . Liver cirrhosis (HCC) 03/08/2020  . Major depressive disorder, recurrent episode, moderate with anxious distress (HCC) 02/11/2020  . Alcohol abuse, in remission 12/16/2019  . Nicotine dependence, cigarettes, uncomplicated 12/16/2019  . Generalized anxiety disorder 12/16/2019    Past Surgical History:  Procedure Laterality Date  . ABDOMINAL HYSTERECTOMY  1986  . CARPAL  TUNNEL RELEASE Bilateral 1980   Per patient  . SHOULDER SURGERY Left 1980  . 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   Procedure: RIGHT TOTAL HIP ARTHROPLASTY ANTERIOR APPROACH;  Surgeon: Gean Birchwood, MD;  Location: [facility];  Service: Orthopedics;  Laterality: Right;     OB History   No obstetric history on file.     Family History  Problem Relation Age of Onset  . Ulcers Father   . Hypotension Sister   . Hypertension Brother     Social History   Tobacco Use  . Smoking status: Current Every Day Smoker    Packs/day: 1.00    Years: 25.00    Pack years: 25.00    Types: Cigarettes    Start date: 08/04/1978  .  Smokeless tobacco: Never Used  Vaping Use  . Vaping Use: Never used  Substance Use Topics  . Alcohol use: Not Currently  . Drug use: Never    Home Medications Prior to Admission medications   Medication Sig Start Date End Date Taking? Authorizing Provider  amLODipine (NORVASC) 10 MG tablet TAKE 1 TABLET BY MOUTH DAILY. Patient taking differently: Take 10 mg by mouth daily. 12/05/19 12/04/20 Yes Grayce Sessions, NP  aspirin EC 81 MG tablet Take 1 tablet (81 mg total) by mouth 2 (two) times daily. 12/24/20  Yes Dannielle Burn K, PA-C  atorvastatin (LIPITOR) 40 MG tablet Take 1 tablet (40 mg total) by mouth daily. 12/18/20  Yes Arvilla Market, DO  budesonide-formoterol (SYMBICORT) 160-4.5 MCG/ACT inhaler Inhale 2 puffs into the lungs 2 (two) times daily.   Yes [provider]  Doxylamine-DM (VICKS DAYQUIL/NYQUIL COUGH PO) Take 1 Dose by mouth daily as needed (cough).   Yes [provider]  folic acid (FOLVITE) 1 MG tablet TAKE 1 TABLET (1 MG TOTAL) BY MOUTH DAILY. Patient taking differently: Take 1 mg by mouth daily. 09/11/20 09/11/21 Yes Arvilla Market, DO  furosemide (LASIX) 20 MG tablet TAKE 1 TABLET BY MOUTH DAILY Patient taking differently: Take 20 mg by mouth daily. 07/12/20 07/12/21 Yes Arvilla Market, DO  guaiFENesin (MUCINEX) 600 MG 12 hr tablet Take 600 mg by mouth 2 (two) times daily.   Yes [provider]  hydrOXYzine (ATARAX/VISTARIL) 50 MG tablet TAKE 1 TABLET (50 MG TOTAL) BY MOUTH 3 (THREE) TIMES DAILY AS NEEDED FOR ANXIETY. Patient taking differently: Take 50 mg by mouth every 8 (eight) hours as needed for anxiety. 11/05/20  Yes Toy Cookey E, NP  Ketotifen Fumarate (ALLERGY EYE DROPS OP) Place 1 drop into both eyes daily as needed (allergies).   Yes [provider]  Menthol (ICY HOT) 5 % PTCH Apply 1 patch topically daily as needed (hip pain).   Yes [provider]  Multiple Vitamin (MULTIVITAMIN WITH  MINERALS) TABS tablet Take 1 tablet by mouth daily.   Yes [provider]  oxybutynin (DITROPAN-XL) 10 MG 24 hr tablet TAKE 1 TABLET (10 MG TOTAL) BY MOUTH AT BEDTIME. Patient taking differently: Take 10 mg by mouth at bedtime. 10/10/20 10/10/21 Yes Arvilla Market, DO  oxyCODONE-acetaminophen (PERCOCET/ROXICET) 5-325 MG tablet Take 1 tablet by mouth every 4 (four) hours as needed for severe pain. 12/24/20  Yes Allena Katz, PA-C  pantoprazole (PROTONIX) 40 MG tablet Take 1 tablet (40 mg total) by mouth 2 (two) times daily. 12/11/20 12/11/21 Yes Arvilla Market, DO  POTASSIUM PO Take 1 tablet by mouth daily.   Yes [provider]  QUEtiapine (SEROQUEL) 200 MG tablet  TAKE 1 TABLET (200 MG TOTAL) BY MOUTH AT BEDTIME. Patient taking differently: Take 200 mg by mouth at bedtime. 11/05/20  Yes Toy Cookey E, NP  sertraline (ZOLOFT) 100 MG tablet TAKE 1 TABLET (100 MG TOTAL) BY MOUTH DAILY WITH BREAKFAST. Patient taking differently: Take 100 mg by mouth daily. 11/05/20  Yes Toy Cookey E, NP  tiZANidine (ZANAFLEX) 2 MG tablet Take 1 tablet (2 mg total) by mouth every 6 (six) hours as needed. Patient taking differently: Take 2 mg by mouth every 6 (six) hours as needed for muscle spasms. 12/24/20  Yes Allena Katz, PA-C  VITAMIN D PO Take 1 capsule by mouth daily.   Yes [provider]  amoxicillin-clavulanate (AUGMENTIN) 875-125 MG tablet TAKE 1 TABLET BY MOUTH 2 TIMES DAILY FOR 10 DAYS. Patient not taking: No sig reported 10/23/20 10/23/21  Christia Reading, MD  Fluticasone-Umeclidin-Vilant (TRELEGY ELLIPTA) 100-62.5-25 MCG/INH AEPB Inhale 1 puff into the lungs daily. 12/11/20   Arvilla Market, DO    Allergies    Amoxicillin  Review of Systems   Review of Systems Ten systems reviewed and are negative for acute change, except as noted in the HPI.   Physical Exam Updated Vital Signs BP (!) 133/59   Pulse 97   Temp (!) 101 F (38.3 C)  (Rectal)   Resp 19   SpO2 94%   Physical Exam Vitals and nursing note reviewed.  Constitutional:      General: She is not in acute distress.    Appearance: She is well-developed. She is not diaphoretic.     Comments: Patient sitting upright with urine stained shorts  HENT:     Head: Normocephalic and atraumatic.  Eyes:     General: No scleral icterus.    Conjunctiva/sclera: Conjunctivae normal.  Cardiovascular:     Rate and Rhythm: Normal rate and regular rhythm.     Heart sounds: Normal heart sounds. No murmur heard. No friction rub. No gallop.   Pulmonary:     Effort: Pulmonary effort is normal. No respiratory distress.     Breath sounds: Normal breath sounds.  Abdominal:     General: Bowel sounds are normal. There is no distension.     Palpations: Abdomen is soft. There is no mass.     Tenderness: There is no abdominal tenderness. There is no guarding.  Musculoskeletal:     Cervical back: Normal range of motion.     Right lower leg: 2+ Pitting Edema present.     Left lower leg: No edema.  Skin:    General: Skin is warm and dry.  Neurological:     Mental Status: She is alert and oriented to person, place, and time.  Psychiatric:        Behavior: Behavior normal.     ED Results / Procedures / Treatments   Labs (all labs ordered are listed, but only abnormal results are displayed) Labs Reviewed  CBC WITH DIFFERENTIAL/PLATELET - Abnormal; Notable for the following components:      Result Value   WBC 11.9 (*)    RBC 2.56 (*)    Hemoglobin 8.3 (*)    HCT 25.5 (*)    Platelets 434 (*)    nRBC 0.8 (*)    Neutro Abs 8.4 (*)    Monocytes Absolute 1.3 (*)    Abs Immature Granulocytes 0.11 (*)    All other components within normal limits  COMPREHENSIVE METABOLIC PANEL - Abnormal; Notable for the following components:   Potassium 2.9 (*)  Glucose, Bld 191 (*)    Calcium 8.3 (*)    Total Protein 6.1 (*)    Albumin 2.7 (*)    All other components within normal  limits  AMMONIA - Abnormal; Notable for the following components:   Ammonia 52 (*)    All other components within normal limits  URINALYSIS, ROUTINE W REFLEX MICROSCOPIC - Abnormal; Notable for the following components:   Specific Gravity, Urine 1.004 (*)    All other components within normal limits  MAGNESIUM - Abnormal; Notable for the following components:   Magnesium 1.6 (*)    All other components within normal limits  SALICYLATE LEVEL - Abnormal; Notable for the following components:   Salicylate Lvl <7.0 (*)    All other components within normal limits  BRAIN NATRIURETIC PEPTIDE - Abnormal; Notable for the following components:   B Natriuretic Peptide 358.3 (*)    All other components within normal limits  I-STAT ARTERIAL BLOOD GAS, ED - Abnormal; Notable for the following components:   pH, Arterial 7.678 (*)    pCO2 arterial 26.7 (*)    pO2, Arterial 127 (*)    Bicarbonate 31.3 (*)    Acid-Base Excess 10.0 (*)    Sodium 132 (*)    Potassium 2.1 (*)    Calcium, Ion 0.96 (*)    HCT 23.0 (*)    Hemoglobin 7.8 (*)    All other components within normal limits  RESP PANEL BY RT-PCR (FLU A&B, COVID) ARPGX2  MRSA PCR SCREENING  LIPASE, BLOOD  PROTIME-INR  ETHANOL    EKG EKG Interpretation  Date/Time:  Monday Dec 31 2020 10:19:42 EDT Ventricular Rate:  75 PR Interval:  168 QRS Duration: 78 QT Interval:  404 QTC Calculation: 452 R Axis:   66 Text Interpretation: Sinus rhythm Nonspecific repol abnormality, diffuse leads Confirmed by Pricilla Loveless [phone]) on 12/31/2020 11:06:56 AM   Radiology CT Head Wo Contrast  Result Date: 12/31/2020 CLINICAL DATA:  60 year old female with history of mental status change. EXAM: CT HEAD WITHOUT CONTRAST TECHNIQUE: Contiguous axial images were obtained from the base of the skull through the vertex without intravenous contrast. COMPARISON:  No priors. FINDINGS: Brain: No evidence of acute infarction, hemorrhage, hydrocephalus,  extra-axial collection or mass lesion/mass effect. Vascular: No hyperdense vessel or unexpected calcification. Skull: Normal. Negative for fracture or focal lesion. Sinuses/Orbits: No acute finding. Other: None. IMPRESSION: 1. No acute intracranial abnormalities. The appearance of the brain is normal. Electronically Signed   By: Trudie Reed M.D.   On: 12/31/2020 13:17   DG Chest Port 1 View  Result Date: 12/31/2020 CLINICAL DATA:  Shortness of breath EXAM: PORTABLE CHEST 1 VIEW COMPARISON:  12/17/2020 FINDINGS: The heart size and mediastinal contours are within normal limits. Heterogeneous airspace opacity of the right mid and lower lung. The visualized skeletal structures are unremarkable. IMPRESSION: Heterogeneous airspace opacity of the right mid and lower lung, consistent with infection or aspiration. Electronically Signed   By: Lauralyn Primes M.D.   On: 12/31/2020 15:06   VAS [place] LOWER EXTREMITY VENOUS (DVT) (ONLY [facility] & [facility])  Result Date: 12/31/2020  Lower Venous DVT Study Patient Name:  [name]  Date of Exam:   12/31/2020 Medical Rec #: [id number]     Accession #:    [id number] Date of Birth: 09-05-1960     Patient Gender: F Patient Age:   59Y Exam Location:  [facility] Procedure:      VAS [place] LOWER EXTREMITY VENOUS (DVT) Referring Phys:  [id number] Evelyn Moch --------------------------------------------------------------------------------  Indications: S/p right hip replacement x1 week, pitting edema.  Limitations: Body habitus, poor ultrasound/tissue interface and patient position. Comparison Study: No prior study Performing Technologist: Gertie Fey MHA, RDMS, RVT, RDCS  Examination Guidelines: A complete evaluation includes B-mode imaging, spectral Doppler, color Doppler, and power Doppler as needed of all accessible portions of each vessel. Bilateral testing is considered an integral part of a complete examination. Limited examinations for reoccurring indications may be performed as  noted. The reflux portion of the exam is performed with the patient in reverse Trendelenburg.  +---------+---------------+---------+-----------+----------+--------------+ RIGHT    CompressibilityPhasicitySpontaneityPropertiesThrombus Aging +---------+---------------+---------+-----------+----------+--------------+ CFV      Full           Yes      Yes                                 +---------+---------------+---------+-----------+----------+--------------+ SFJ      Full                                                        +---------+---------------+---------+-----------+----------+--------------+ FV Prox  Full                                                        +---------+---------------+---------+-----------+----------+--------------+ FV Mid   Full                                                        +---------+---------------+---------+-----------+----------+--------------+ FV DistalFull                                                        +---------+---------------+---------+-----------+----------+--------------+ PFV      Full                                                        +---------+---------------+---------+-----------+----------+--------------+ POP      Full           Yes      Yes                                 +---------+---------------+---------+-----------+----------+--------------+ PTV      Full                                                        +---------+---------------+---------+-----------+----------+--------------+   Right Technical Findings: Not visualized segments include peroneal veins.  Left Technical Findings: Not visualized segments include CFV.   Summary: RIGHT: - There is no evidence of deep vein thrombosis in the lower extremity. However, portions of this examination were limited- see technologist comments above.  - No cystic structure found in the popliteal fossa.   *See table(s) above for measurements and  observations.    Preliminary     Procedures .Critical Care Performed by: Arthor Captain, PA-C Authorized by: Arthor Captain, PA-C   Critical care provider statement:    Critical care time (minutes):  50   Critical care time was exclusive of:  Separately billable procedures and treating other patients   Critical care was necessary to treat or prevent imminent or life-threatening deterioration of the following conditions:  Respiratory failure   Critical care was time spent personally by me on the following activities:  Discussions with consultants, evaluation of patient's response to treatment, examination of patient, ordering and performing treatments and interventions, ordering and review of laboratory studies, ordering and review of radiographic studies, pulse oximetry, re-evaluation of patient's condition, obtaining history from patient or surrogate and review of old charts     Medications Ordered in ED Medications  vancomycin (VANCOREADY) IVPB 1500 mg/300 mL (1,500 mg Intravenous New Bag/Given 12/31/20 1622)  vancomycin (VANCOREADY) IVPB 1250 mg/250 mL (has no administration in time range)  potassium chloride SA (KLOR-CON) CR tablet 40 mEq (40 mEq Oral Given 12/31/20 1239)  magnesium sulfate IVPB 2 g 50 mL (0 g Intravenous Stopped 12/31/20 1521)  methylPREDNISolone sodium succinate (SOLU-MEDROL) 125 mg/2 mL injection 125 mg (125 mg Intravenous Given 12/31/20 1523)  albuterol (VENTOLIN HFA) 108 (90 Base) MCG/ACT inhaler 8 puff (8 puffs Inhalation Given 12/31/20 1525)  ceFEPIme (MAXIPIME) 2 g in sodium chloride 0.9 % 100 mL IVPB (0 g Intravenous Stopped 12/31/20 1622)    ED Course  I have reviewed the triage vital signs and the nursing notes.  Pertinent labs & imaging results that were available during my care of the patient were reviewed by me and considered in my medical decision making (see chart for details).  Clinical Course as of 12/31/20 1635  Mon Dec 31, 2020  1112  Potassium(!!): 2.1 [AH]  1440 I have ressessed the patient- She is breathing more rapidly, tachypneic. Requiring 02 supplementaion. Air movement poor. Grunting and wheezing. Suspect COPD exacerbation- will treat  [AH]    Clinical Course User Index [AH] Arthor Captain, PA-C   MDM Rules/Calculators/A&P                          60 year old female here with altered mental status. The differential diagnosis for AMS is extensive and includes, but is not limited to: drug overdose - opioids, alcohol, sedatives, antipsychotics, drug withdrawal, others; Metabolic: hypoxia, hypoglycemia, hyperglycemia, hypercalcemia, hypernatremia, hyponatremia, uremia, hepatic encephalopathy, hypothyroidism, hyperthyroidism, vitamin B12 or thiamine deficiency, carbon monoxide poisoning, Wilson's disease, Lactic acidosis, DKA/HHOS; Infectious: meningitis, encephalitis, bacteremia/sepsis, urinary tract infection, pneumonia, neurosyphilis; Structural: Space-occupying lesion, (brain tumor, subdural hematoma, hydrocephalus,); Vascular: stroke, subarachnoid hemorrhage, coronary ischemia, hypertensive encephalopathy, CNS vasculitis, thrombotic thrombocytopenic purpura, disseminated intravascular coagulation, hyperviscosity; Psychiatric: Schizophrenia, depression; Other: Seizure, hypothermia, heat stroke, ICU psychosis, dementia -"sundowning."  I ordered and reviewed labs and includes i-STAT ABG which shows a respiratory alkalosis.  Patient does have COPD and I think her bicarb is elevated chronically.  Patient's potassium is also low and I have given her oral repletion.  Patient's BNP only slightly elevated, magnesium is low, ammonia elevated.  Urine without  infection.  CBC shows white blood cell count of 11.9 with left shift.  I ordered and reviewed a 1 view chest x-ray which shows right Lower lobe pneumonia. Vascular ultrasound negative for DVT.  Stat head CT shows no acute abnormalities.  EKG shows sinus rhythm at a rate of  75. Patient became progressively more tachypneic and hypoxic.  Requiring oxygen supplementation.  Rectal temperature of 101.  I suspect this is all secondary to her healthcare-acquired pneumonia however patient also could have pulmonary embolus and altered mental status also secondary to pain medication use after recent surgery.  Patient care discussed with family medicine who will admit the patient.  She is receiving treatment for her breathing with albuterol, IV magnesium, Solu-Medrol and treatment for her pneumonia with vancomycin and cefepime.  Final Clinical Impression(s) / ED Diagnoses Final diagnoses:  HAP (hospital-acquired pneumonia)  Hypoxia  Acute metabolic encephalopathy    Rx / DC Orders ED Discharge Orders    None       Arthor Captain, PA-C 12/31/20 1638    Pricilla Loveless, MD 01/05/21 0041

## 2021-01-01 ENCOUNTER — Encounter (HOSPITAL_COMMUNITY): Payer: Self-pay | Admitting: Family Medicine

## 2021-01-01 ENCOUNTER — Inpatient Hospital Stay (HOSPITAL_COMMUNITY): Payer: Medicaid Other

## 2021-01-01 DIAGNOSIS — F29 Unspecified psychosis not due to a substance or known physiological condition: Secondary | ICD-10-CM

## 2021-01-01 DIAGNOSIS — R062 Wheezing: Secondary | ICD-10-CM

## 2021-01-01 DIAGNOSIS — E876 Hypokalemia: Secondary | ICD-10-CM

## 2021-01-01 DIAGNOSIS — R0609 Other forms of dyspnea: Secondary | ICD-10-CM | POA: Diagnosis not present

## 2021-01-01 DIAGNOSIS — R0902 Hypoxemia: Secondary | ICD-10-CM

## 2021-01-01 LAB — COMPREHENSIVE METABOLIC PANEL
ALT: 18 U/L (ref 0–44)
AST: 18 U/L (ref 15–41)
Albumin: 2.4 g/dL — ABNORMAL LOW (ref 3.5–5.0)
Alkaline Phosphatase: 95 U/L (ref 38–126)
Anion gap: 10 (ref 5–15)
BUN: 5 mg/dL — ABNORMAL LOW (ref 6–20)
CO2: 29 mmol/L (ref 22–32)
Calcium: 8.1 mg/dL — ABNORMAL LOW (ref 8.9–10.3)
Chloride: 95 mmol/L — ABNORMAL LOW (ref 98–111)
Creatinine, Ser: 0.67 mg/dL (ref 0.44–1.00)
GFR, Estimated: >60 mL/min (ref >60)
Glucose, Bld: 298 mg/dL — ABNORMAL HIGH (ref 70–99)
Potassium: 3.1 mmol/L — ABNORMAL LOW (ref 3.5–5.1)
Sodium: 134 mmol/L — ABNORMAL LOW (ref 135–145)
Total Bilirubin: 0.9 mg/dL (ref 0.3–1.2)
Total Protein: 5.9 g/dL — ABNORMAL LOW (ref 6.5–8.1)

## 2021-01-01 LAB — CBC WITH DIFFERENTIAL/PLATELET
Abs Immature Granulocytes: 0.09 10*3/uL — ABNORMAL HIGH (ref 0.00–0.07)
Basophils Absolute: 0 10*3/uL (ref 0.0–0.1)
Basophils Relative: 0 %
Eosinophils Absolute: 0 10*3/uL (ref 0.0–0.5)
Eosinophils Relative: 0 %
HCT: 23.3 % — ABNORMAL LOW (ref 36.0–46.0)
Hemoglobin: 7.7 g/dL — ABNORMAL LOW (ref 12.0–15.0)
Immature Granulocytes: 1 %
Lymphocytes Relative: 5 %
Lymphs Abs: 0.4 10*3/uL — ABNORMAL LOW (ref 0.7–4.0)
MCH: 31.7 pg (ref 26.0–34.0)
MCHC: 33 g/dL (ref 30.0–36.0)
MCV: 95.9 fL (ref 80.0–100.0)
Monocytes Absolute: 0.3 10*3/uL (ref 0.1–1.0)
Monocytes Relative: 3 %
Neutro Abs: 8.5 10*3/uL — ABNORMAL HIGH (ref 1.7–7.7)
Neutrophils Relative %: 91 %
Platelets: 403 10*3/uL — ABNORMAL HIGH (ref 150–400)
RBC: 2.43 MIL/uL — ABNORMAL LOW (ref 3.87–5.11)
RDW: 11.9 % (ref 11.5–15.5)
WBC: 9.3 10*3/uL (ref 4.0–10.5)
nRBC: 0.6 % — ABNORMAL HIGH (ref 0.0–0.2)

## 2021-01-01 LAB — BASIC METABOLIC PANEL
Anion gap: 14 (ref 5–15)
BUN: 7 mg/dL (ref 6–20)
CO2: 24 mmol/L (ref 22–32)
Calcium: 8.3 mg/dL — ABNORMAL LOW (ref 8.9–10.3)
Chloride: 96 mmol/L — ABNORMAL LOW (ref 98–111)
Creatinine, Ser: 0.71 mg/dL (ref 0.44–1.00)
GFR, Estimated: >60 mL/min (ref >60)
Glucose, Bld: 302 mg/dL — ABNORMAL HIGH (ref 70–99)
Potassium: 3.6 mmol/L (ref 3.5–5.1)
Sodium: 134 mmol/L — ABNORMAL LOW (ref 135–145)

## 2021-01-01 LAB — ECHOCARDIOGRAM COMPLETE
AR max vel: 1.96 cm2
AV Area VTI: 2.12 cm2
AV Area mean vel: 1.93 cm2
AV Mean grad: 10 mmHg
AV Peak grad: 17.6 mmHg
Ao pk vel: 2.1 m/s
Area-P 1/2: 5.62 cm2
MV VTI: 2.46 cm2
S' Lateral: 3.15 cm

## 2021-01-01 LAB — MAGNESIUM: Magnesium: 2 mg/dL (ref 1.7–2.4)

## 2021-01-01 LAB — GLUCOSE, CAPILLARY
Glucose-Capillary: 312 mg/dL — ABNORMAL HIGH (ref 70–99)
Glucose-Capillary: 131 mg/dL — ABNORMAL HIGH (ref 70–99)

## 2021-01-01 LAB — PROCALCITONIN: Procalcitonin: 0.11 ng/mL

## 2021-01-01 MED ORDER — ALBUTEROL SULFATE HFA 108 (90 BASE) MCG/ACT IN AERS
2.0000 | INHALATION_SPRAY | RESPIRATORY_TRACT | Status: DC | PRN
  Administered 2021-01-01: 2 via RESPIRATORY_TRACT

## 2021-01-01 MED ORDER — SERTRALINE HCL 50 MG PO TABS
150.0000 mg | ORAL_TABLET | Freq: Every day | ORAL | Status: DC
  Administered 2021-01-02 – 2021-01-03 (×2): 150 mg via ORAL
  Filled 2021-01-01 (×2): qty 1

## 2021-01-01 MED ORDER — DOXYCYCLINE HYCLATE 100 MG PO TABS
100.0000 mg | ORAL_TABLET | Freq: Two times a day (BID) | ORAL | Status: DC
  Administered 2021-01-01 – 2021-01-03 (×5): 100 mg via ORAL
  Filled 2021-01-01 (×5): qty 1

## 2021-01-01 MED ORDER — PERFLUTREN LIPID MICROSPHERE
1.0000 mL | INTRAVENOUS | Status: AC | PRN
  Administered 2021-01-01: 4 mL via INTRAVENOUS
  Filled 2021-01-01: qty 10

## 2021-01-01 MED ORDER — POTASSIUM CHLORIDE CRYS ER 20 MEQ PO TBCR
40.0000 meq | EXTENDED_RELEASE_TABLET | Freq: Once | ORAL | Status: AC
  Administered 2021-01-01: 40 meq via ORAL
  Filled 2021-01-01: qty 2

## 2021-01-01 MED ORDER — MOMETASONE FURO-FORMOTEROL FUM 200-5 MCG/ACT IN AERO
2.0000 | INHALATION_SPRAY | Freq: Two times a day (BID) | RESPIRATORY_TRACT | Status: DC
  Administered 2021-01-01 – 2021-01-02 (×3): 2 via RESPIRATORY_TRACT
  Filled 2021-01-01 (×2): qty 8.8

## 2021-01-01 MED ORDER — SODIUM CHLORIDE 0.9 % IV SOLN
2.0000 g | INTRAVENOUS | Status: DC
  Administered 2021-01-02 – 2021-01-03 (×2): 2 g via INTRAVENOUS
  Filled 2021-01-01 (×2): qty 2

## 2021-01-01 MED ORDER — ASPIRIN EC 81 MG PO TBEC: 81.0000 mg | DELAYED_RELEASE_TABLET | Freq: Two times a day (BID) | ORAL | Status: DC

## 2021-01-01 MED ORDER — ONDANSETRON HCL 4 MG PO TABS
4.0000 mg | ORAL_TABLET | Freq: Two times a day (BID) | ORAL | Status: DC | PRN
  Administered 2021-01-01 – 2021-01-02 (×2): 4 mg via ORAL
  Filled 2021-01-01 (×4): qty 1

## 2021-01-01 MED ORDER — POTASSIUM CHLORIDE 10 MEQ/100ML IV SOLN
10.0000 meq | INTRAVENOUS | Status: AC
  Administered 2021-01-01 (×3): 10 meq via INTRAVENOUS
  Filled 2021-01-01 (×3): qty 100

## 2021-01-01 MED ORDER — QUETIAPINE FUMARATE 100 MG PO TABS
300.0000 mg | ORAL_TABLET | Freq: Every day | ORAL | Status: DC
  Administered 2021-01-01 – 2021-01-02 (×2): 300 mg via ORAL
  Filled 2021-01-01 (×2): qty 3

## 2021-01-01 MED ORDER — SODIUM CHLORIDE 0.9 % IV SOLN
1.0000 g | INTRAVENOUS | Status: DC
  Administered 2021-01-01: 1 g via INTRAVENOUS
  Filled 2021-01-01: qty 1

## 2021-01-01 NOTE — Hospital Course (Addendum)
Bobbette Eakes is a 60 y.o. female presenting with dyspnea . PMH is significant for AVN s/p R total hip arthroplasty, alcohol abuse, cirrhosis, COPD, nicotine dependence.   Acute hypoxic hypocapnic respiratory failure 2/2 Pneumonia  Worsening SOB since hip replacement surgery a week ago with concern for altered mental status and hallucinations. On presentation patient A&O x4 without hallucinations. Tachypnic to 25 bpm, rectal temp of 101. HR and BP wnl. Satting 94% on 4L [address] [place] [address] (no O2 requirement at baseline). Lung sounds significant for mild diffuse crackles, and wheezing in upper lobes bilaterally. BNP elevated at 358.3. WBC elevated at 11.9. D dimer 2.91. Lactic acid 2.0. She had a respiratory alkalosis with ABG significant for pH 7.678, pCO2 26.7, pO2 127. CXR significant for heterogeneous airspace opacity of the right mid and lower lung, consistent with infection or aspiration. CTA negative for PE. UA negative. CT head negative. EKG NSR. In ED was started on Vancomycin and Cefepime, and blood cultures were obtained. Received 1 dose of methylprednisolone.   Bilateral lower extremity pain On presentation patient endorsed LE pain, worse on R side. DVT US negative though limited. Pain controlled with Tylenol 650mg  every 6 hours as needed, and Oxycodone 2.5mg  every 6 hours as needed.   DM2 Glucose 191 on admission. Patient denied any medications prior. CBGs were checked with meals and at bedtime. She was on sliding scale insulin.   Follow up:  *** Needs to see GI for iron deficiency anemia work up / colonoscopy   ***

## 2021-01-01 NOTE — Progress Notes (Signed)
FMTS Brief Note  Full note to follow up. In to see patient. Speech tangential, but alert and appropriate. Has decided she wishes to be full code. Status changed, nursing updated.  Terisa Starr, MD  Family Medicine Teaching Service

## 2021-01-01 NOTE — Consult Note (Addendum)
[facility] Face-to-Face Psychiatry Consult   Reason for Consult: Concern for Psychosis Referring Physician:  Terisa Starr MD Patient Identification: [name] MRN:  [id number] Principal Diagnosis: <principal problem not specified> Diagnosis:  Active Problems:   Pneumonia   Hypoxia   Hypokalemia   Wheezing   Total Time spent with patient: 30 minutes  Subjective:   [name] is a 60 y.o. female patient admitted with dyspnea . PMH is significant for AVN s/p R total hip arthroplasty, alcohol abuse, cirrhosis, COPD, nicotine dependence. She is reporting increased anxiety and decreased sleep.  HPI:   She reports that she has been medicated for depression and anxiety since her 30's. She has been seen at [facility] for the last year. She reports that before her multiple medical issues she had been stable on her medications and had no issues taking them. She reports that she has had to have a hip replacement and then her mobility has decreased significantly, then she has been having trouble breathing. She reports her anxiety has been much worse since starting all of this. She reports that her sleep has been worse recently as well. She reports that she has been talking to herself because the tv in her room is broken. She reports no SI, HI, or AVH. She reports that she has not been able to eat much due to nausea. She reports her sleep has been poor recently.  Past Psychiatric History: MDD, Anxiety  Risk to Self:  No Risk to Others:  No Prior Inpatient Therapy:  None Prior Outpatient Therapy:  Yes Dr. Evelene Croon  Past Medical History:  Past Medical History:  Diagnosis Date  . Anxiety   . Arthritis   . COPD (chronic obstructive pulmonary disease) (HCC)   . Depression   . Dyspnea   . GERD (gastroesophageal reflux disease)   . Hypertension   . Peripheral edema    2+ at PAT visit    Past Surgical History:  Procedure Laterality Date  . ABDOMINAL HYSTERECTOMY  1986  . CARPAL TUNNEL RELEASE Bilateral  1980   Per patient  . SHOULDER SURGERY Left 1980  . 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   Procedure: RIGHT TOTAL HIP ARTHROPLASTY ANTERIOR APPROACH;  Surgeon: Gean Birchwood, MD;  Location: [facility];  Service: Orthopedics;  Laterality: Right;   Family History:  Family History  Problem Relation Age of Onset  . Ulcers Father   . Hypotension Sister   . Hypertension Brother    Family Psychiatric  History: Reports several psychiatric issues in her mothers family Social History:  Social History   Substance and Sexual Activity  Alcohol Use Not Currently     Social History   Substance and Sexual Activity  Drug Use Never    Social History   Socioeconomic History  . Marital status: Single    Spouse name: Not on file  . Number of children: 0  . Years of education: 57  . Highest education level: GED or equivalent  Occupational History  . Occupation: Armed forces operational officer  Tobacco Use  . Smoking status: Current Every Day Smoker    Packs/day: 1.00    Years: 25.00    Pack years: 25.00    Types: Cigarettes    Start date: 08/04/1978  . Smokeless tobacco: Never Used  Vaping Use  . Vaping Use: Never used  Substance and Sexual Activity  . Alcohol use: Not Currently  . Drug use: Never  . Sexual activity: Never  Other Topics Concern  . Not on file  Social History Narrative   Lives and step-dad, they are able to help.   Social Determinants of Health   Financial Resource Strain: Not on file  Food Insecurity: Not on file  Transportation Needs: Not on file  Physical Activity: Not on file  Stress: Not on file  Social Connections: Not on file   Additional Social History:    Allergies:   Allergies  Allergen Reactions  . Tizanidine Other (See Comments)    May 2022- Reported hallucinations vs dreams w/ sleep walking at home when tizanidine added post-op   . Amoxicillin     Yeast infection     Labs:  Results for orders placed or performed during the hospital encounter of 12/31/20  (from the past 48 hour(s))  CBC with Differential     Status: Abnormal   Collection Time: 12/31/20 10:08 AM  Result Value Ref Range   WBC 11.9 (H) 4.0 - 10.5 K/uL   RBC 2.56 (L) 3.87 - 5.11 MIL/uL   Hemoglobin 8.3 (L) 12.0 - 15.0 g/dL   HCT 16.1 (L) 09.6 - 04.5 %   MCV 99.6 80.0 - 100.0 fL   MCH 32.4 26.0 - 34.0 pg   MCHC 32.5 30.0 - 36.0 g/dL   RDW 40.9 81.1 - 91.4 %   Platelets 434 (H) 150 - 400 K/uL   nRBC 0.8 (H) 0.0 - 0.2 %   Neutrophils Relative % 70 %   Neutro Abs 8.4 (H) 1.7 - 7.7 K/uL   Lymphocytes Relative 14 %   Lymphs Abs 1.7 0.7 - 4.0 K/uL   Monocytes Relative 11 %   Monocytes Absolute 1.3 (H) 0.1 - 1.0 K/uL   Eosinophils Relative 3 %   Eosinophils Absolute 0.4 0.0 - 0.5 K/uL   Basophils Relative 1 %   Basophils Absolute 0.1 0.0 - 0.1 K/uL   Immature Granulocytes 1 %   Abs Immature Granulocytes 0.11 (H) 0.00 - 0.07 K/uL    Comment: Performed at [facility], [address], [place], [place] [address]  Comprehensive metabolic panel     Status: Abnormal   Collection Time: 12/31/20 10:08 AM  Result Value Ref Range   Sodium 139 135 - 145 mmol/L   Potassium 2.9 (L) 3.5 - 5.1 mmol/L   Chloride 98 98 - 111 mmol/L   CO2 29 22 - 32 mmol/L   Glucose, Bld 191 (H) 70 - 99 mg/dL    Comment: Glucose reference range applies only to samples taken after fasting for at least 8 hours.   BUN 7 6 - 20 mg/dL   Creatinine, Ser 6.21 0.44 - 1.00 mg/dL   Calcium 8.3 (L) 8.9 - 10.3 mg/dL   Total Protein 6.1 (L) 6.5 - 8.1 g/dL   Albumin 2.7 (L) 3.5 - 5.0 g/dL   AST 19 15 - 41 U/L   ALT 19 0 - 44 U/L   Alkaline Phosphatase 89 38 - 126 U/L   Total Bilirubin 1.2 0.3 - 1.2 mg/dL   GFR, Estimated >30 >86 mL/min    Comment: (NOTE) Calculated using the CKD-EPI Creatinine Equation (2021)    Anion gap 12 5 - 15    Comment: Performed at [facility], [address], [place], [place] [address]  Lipase, blood     Status: None   Collection Time: 12/31/20 10:08 AM  Result Value  Ref Range   Lipase 21 11 - 51 U/L    Comment: Performed at [facility] Lab, [address], [place], [place] [address]  Protime-INR     Status: None   Collection Time: 12/31/20 10:08 AM  Result Value Ref Range   Prothrombin Time 14.9 11.4 - 15.2 seconds   INR 1.2 0.8 - 1.2    Comment: (NOTE) INR goal varies based on device and disease states. Performed at [facility], [address] [place], [place] [address]   Ammonia     Status: Abnormal   Collection Time: 12/31/20 10:10 AM  Result Value Ref Range   Ammonia 52 (H) 9 - 35 umol/L    Comment: Performed at [facility], [address] [place], [place] [address]  Urinalysis, Routine w reflex microscopic Urine, Clean Catch     Status: Abnormal   Collection Time: 12/31/20 10:10 AM  Result Value Ref Range   Color, Urine YELLOW YELLOW   APPearance CLEAR CLEAR   Specific Gravity, Urine 1.004 (L) 1.005 - 1.030   pH 7.0 5.0 - 8.0   Glucose, UA NEGATIVE NEGATIVE mg/dL   Hgb urine dipstick NEGATIVE NEGATIVE   Bilirubin Urine NEGATIVE NEGATIVE   Ketones, ur NEGATIVE NEGATIVE mg/dL   Protein, ur NEGATIVE NEGATIVE mg/dL   Nitrite NEGATIVE NEGATIVE   Leukocytes,Ua NEGATIVE NEGATIVE    Comment: Performed at [facility], [address], [place], [place] [address]  I-Stat arterial blood gas, ED     Status: Abnormal   Collection Time: 12/31/20 10:56 AM  Result Value Ref Range   pH, Arterial 7.678 (HH) 7.350 - 7.450   pCO2 arterial 26.7 (L) 32.0 - 48.0 mmHg   pO2, Arterial 127 (H) 83.0 - 108.0 mmHg   Bicarbonate 31.3 (H) 20.0 - 28.0 mmol/L   TCO2 32 22 - 32 mmol/L   O2 Saturation 100.0 %   Acid-Base Excess 10.0 (H) 0.0 - 2.0 mmol/L   Sodium 132 (L) 135 - 145 mmol/L   Potassium 2.1 (LL) 3.5 - 5.1 mmol/L   Calcium, Ion 0.96 (L) 1.15 - 1.40 mmol/L   HCT 23.0 (L) 36.0 - 46.0 %   Hemoglobin 7.8 (L) 12.0 - 15.0 g/dL   Patient temperature 29.5 F    Collection site Radial    Drawn by RT    Sample type ARTERIAL     Comment NOTIFIED PHYSICIAN   Magnesium     Status: Abnormal   Collection Time: 12/31/20 12:42 PM  Result Value Ref Range   Magnesium 1.6 (L) 1.7 - 2.4 mg/dL    Comment: Performed at [facility], [address], [place], [place] [address]  Salicylate level     Status: Abnormal   Collection Time: 12/31/20 12:42 PM  Result Value Ref Range   Salicylate Lvl <7.0 (L) 7.0 - 30.0 mg/dL    Comment: Performed at [facility], [address], [place], [place] [address]  Brain natriuretic peptide     Status: Abnormal   Collection Time: 12/31/20  2:03 PM  Result Value Ref Range   B Natriuretic Peptide 358.3 (H) 0.0 - 100.0 pg/mL    Comment: Performed at [facility] Lab, [address] [place], [place] [address]  Resp Panel by RT-PCR (Flu A&B, Covid) Nasopharyngeal Swab     Status: None   Collection Time: 12/31/20  2:11 PM   Specimen: Nasopharyngeal Swab; Nasopharyngeal(NP) swabs in vial transport medium  Result Value Ref Range   SARS Coronavirus 2 by RT PCR NEGATIVE NEGATIVE    Comment: (NOTE) SARS-CoV-2 target nucleic acids are NOT  DETECTED.  The SARS-CoV-2 RNA is generally detectable in upper respiratory specimens during the acute phase of infection. The lowest concentration of SARS-CoV-2 viral copies this assay can detect is 138 copies/mL. A negative result does not preclude SARS-Cov-2 infection and should not be used as the sole basis for treatment or other patient management decisions. A negative result may occur with  improper specimen collection/handling, submission of specimen other than nasopharyngeal swab, presence of viral mutation(s) within the areas targeted by this assay, and inadequate number of viral copies(<138 copies/mL). A negative result must be combined with clinical observations, patient history, and epidemiological information. The expected result is Negative.  Fact Sheet for Patients:  BloggerCourse.comhttps://www.fda.gov/media/152166/download  Fact Sheet for  Healthcare Providers:  SeriousBroker.ithttps://www.fda.gov/media/152162/download  This test is no t yet approved or cleared by the [place] FDA and  has been authorized for detection and/or diagnosis of SARS-CoV-2 by FDA under an Emergency Use Authorization (EUA). This EUA will remain  in effect (meaning this test can be used) for the duration of the COVID-19 declaration under Section 564(b)(1) of the Act, 21 U.S.C.section 360bbb-3(b)(1), unless the authorization is terminated  or revoked sooner.       Influenza A by PCR NEGATIVE NEGATIVE   Influenza B by PCR NEGATIVE NEGATIVE    Comment: (NOTE) The Xpert Xpress SARS-CoV-2/FLU/RSV plus assay is intended as an aid in the diagnosis of influenza from Nasopharyngeal swab specimens and should not be used as a sole basis for treatment. Nasal washings and aspirates are unacceptable for Xpert Xpress SARS-CoV-2/FLU/RSV testing.  Fact Sheet for Patients: BloggerCourse.comhttps://www.fda.gov/media/152166/download  Fact Sheet for Healthcare Providers: SeriousBroker.ithttps://www.fda.gov/media/152162/download  This test is not yet approved or cleared by the [place] FDA and has been authorized for detection and/or diagnosis of SARS-CoV-2 by FDA under an Emergency Use Authorization (EUA). This EUA will remain in effect (meaning this test can be used) for the duration of the COVID-19 declaration under Section 564(b)(1) of the Act, 21 U.S.C. section 360bbb-3(b)(1), unless the authorization is terminated or revoked.  Performed at [facility], [address] [place], [place] [address]   Ethanol     Status: None   Collection Time: 12/31/20  7:13 PM  Result Value Ref Range   Alcohol, Ethyl (B) <10 <10 mg/dL    Comment: (NOTE) Lowest detectable limit for serum alcohol is 10 mg/dL.  For medical purposes only. Performed at [facility], [address] [place], [place] [address]   HIV Antibody (routine testing w rflx)     Status: None   Collection Time: 12/31/20   7:13 PM  Result Value Ref Range   HIV Screen 4th Generation wRfx Non Reactive Non Reactive    Comment: Performed at [facility], [address] [place], [place] [address]  CBC     Status: Abnormal   Collection Time: 12/31/20  7:13 PM  Result Value Ref Range   WBC 12.5 (H) 4.0 - 10.5 K/uL   RBC 2.71 (L) 3.87 - 5.11 MIL/uL   Hemoglobin 8.6 (L) 12.0 - 15.0 g/dL   HCT 25.926.3 (L) 56.336.0 - 87.546.0 %   MCV 97.0 80.0 - 100.0 fL   MCH 31.7 26.0 - 34.0 pg   MCHC 32.7 30.0 - 36.0 g/dL   RDW 64.312.2 32.911.5 - 51.815.5 %   Platelets 479 (H) 150 - 400 K/uL   nRBC 0.5 (H) 0.0 - 0.2 %    Comment: Performed at [facility], [address] [place],  [place] [address]  Basic metabolic panel     Status: Abnormal   Collection Time: 12/31/20  7:13 PM  Result Value Ref Range   Sodium 135 135 - 145 mmol/L   Potassium 2.7 (LL) 3.5 - 5.1 mmol/L    Comment: CRITICAL RESULT CALLED TO, READ BACK BY AND VERIFIED WITH: Mathis Fare, RN. 2054 12/3020 A. MCDOWELL    Chloride 93 (L) 98 - 111 mmol/L   CO2 28 22 - 32 mmol/L   Glucose, Bld 242 (H) 70 - 99 mg/dL    Comment: Glucose reference range applies only to samples taken after fasting for at least 8 hours.   BUN 5 (L) 6 - 20 mg/dL   Creatinine, Ser 3.50 0.44 - 1.00 mg/dL   Calcium 8.2 (L) 8.9 - 10.3 mg/dL   GFR, Estimated >09 >38 mL/min    Comment: (NOTE) Calculated using the CKD-EPI Creatinine Equation (2021)    Anion gap 14 5 - 15    Comment: Performed at [facility], [address] [place], [place] [address]  Lactic acid, plasma     Status: Abnormal   Collection Time: 12/31/20  7:13 PM  Result Value Ref Range   Lactic Acid, Venous 2.0 (HH) 0.5 - 1.9 mmol/L    Comment: CRITICAL RESULT CALLED TO, READ BACK BY AND VERIFIED WITH: A OPOKU RN [id number] K FORSYTH Performed at [facility], [address] [place], [place] [address]   D-dimer, quantitative     Status: Abnormal   Collection Time: 12/31/20  7:13 PM  Result Value Ref Range    D-Dimer, Quant 2.91 (H) 0.00 - 0.50 ug/mL-FEU    Comment: (NOTE) At the manufacturer cut-off value of 0.5 g/mL FEU, this assay has a negative predictive value of 95-100%.This assay is intended for use in conjunction with a clinical pretest probability (PTP) assessment model to exclude pulmonary embolism (PE) and deep venous thrombosis (DVT) in outpatients suspected of PE or DVT. Results should be correlated with clinical presentation. Performed at [facility], [address] [place], [place] [address]   Procalcitonin - Baseline     Status: None   Collection Time: 12/31/20  7:13 PM  Result Value Ref Range   Procalcitonin 0.15 ng/mL    Comment:        Interpretation: PCT (Procalcitonin) <= 0.5 ng/mL: Systemic infection (sepsis) is not likely. Local bacterial infection is possible. (NOTE)       Sepsis PCT Algorithm           Lower Respiratory Tract                                      Infection PCT Algorithm    ----------------------------     ----------------------------         PCT < 0.25 ng/mL                PCT < 0.10 ng/mL          Strongly encourage             Strongly discourage   discontinuation of antibiotics    initiation of antibiotics    ----------------------------     -----------------------------       PCT 0.25 - 0.50 ng/mL            PCT 0.10 - 0.25 ng/mL  OR       >80% decrease in PCT            Discourage initiation of                                            antibiotics      Encourage discontinuation           of antibiotics    ----------------------------     -----------------------------         PCT >= 0.50 ng/mL              PCT 0.26 - 0.50 ng/mL               AND        <80% decrease in PCT             Encourage initiation of                                             antibiotics       Encourage continuation           of antibiotics    ----------------------------     -----------------------------        PCT >= 0.50 ng/mL                   PCT > 0.50 ng/mL               AND         increase in PCT                  Strongly encourage                                      initiation of antibiotics    Strongly encourage escalation           of antibiotics                                     -----------------------------                                           PCT <= 0.25 ng/mL                                                 OR                                        > 80% decrease in PCT                                      Discontinue / Do not initiate  antibiotics  Performed at [facility], [address], [place], [place] [address]   MRSA PCR Screening     Status: None   Collection Time: 12/31/20  8:50 PM   Specimen: Nasopharyngeal  Result Value Ref Range   MRSA by PCR NEGATIVE NEGATIVE    Comment:        The GeneXpert MRSA Assay (FDA approved for NASAL specimens only), is one component of a comprehensive MRSA colonization surveillance program. It is not intended to diagnose MRSA infection nor to guide or monitor treatment for MRSA infections. Performed at [facility], [address], [place], [place] [address]   Culture, blood (routine x 2)     Status: None (Preliminary result)   Collection Time: 12/31/20  9:20 PM   Specimen: BLOOD LEFT ARM  Result Value Ref Range   Specimen Description BLOOD LEFT ARM    Special Requests      BOTTLES DRAWN AEROBIC AND ANAEROBIC Blood Culture adequate volume   Culture      NO GROWTH < 12 HOURS Performed at [facility], [address], [place], [place] [address]    Report Status PENDING   Culture, blood (routine x 2)     Status: None (Preliminary result)   Collection Time: 12/31/20  9:20 PM   Specimen: BLOOD RIGHT HAND  Result Value Ref Range   Specimen Description BLOOD RIGHT HAND    Special Requests      BOTTLES DRAWN AEROBIC AND ANAEROBIC Blood Culture adequate volume   Culture      NO  GROWTH < 12 HOURS Performed at [facility], [address] [place], [place] [address]    Report Status PENDING   Comprehensive metabolic panel     Status: Abnormal   Collection Time: 01/01/21  1:57 AM  Result Value Ref Range   Sodium 134 (L) 135 - 145 mmol/L   Potassium 3.1 (L) 3.5 - 5.1 mmol/L   Chloride 95 (L) 98 - 111 mmol/L   CO2 29 22 - 32 mmol/L   Glucose, Bld 298 (H) 70 - 99 mg/dL    Comment: Glucose reference range applies only to samples taken after fasting for at least 8 hours.   BUN 5 (L) 6 - 20 mg/dL   Creatinine, Ser 2.95 0.44 - 1.00 mg/dL   Calcium 8.1 (L) 8.9 - 10.3 mg/dL   Total Protein 5.9 (L) 6.5 - 8.1 g/dL   Albumin 2.4 (L) 3.5 - 5.0 g/dL   AST 18 15 - 41 U/L   ALT 18 0 - 44 U/L   Alkaline Phosphatase 95 38 - 126 U/L   Total Bilirubin 0.9 0.3 - 1.2 mg/dL   GFR, Estimated >62 >13 mL/min    Comment: (NOTE) Calculated using the CKD-EPI Creatinine Equation (2021)    Anion gap 10 5 - 15    Comment: Performed at [facility], [address], [place], [place] [address]  Procalcitonin     Status: None   Collection Time: 01/01/21  1:57 AM  Result Value Ref Range   Procalcitonin 0.11 ng/mL    Comment:        Interpretation: PCT (Procalcitonin) <= 0.5 ng/mL: Systemic infection (sepsis) is not likely. Local bacterial infection is possible. (NOTE)       Sepsis PCT Algorithm           Lower Respiratory Tract  Infection PCT Algorithm    ----------------------------     ----------------------------         PCT < 0.25 ng/mL                PCT < 0.10 ng/mL          Strongly encourage             Strongly discourage   discontinuation of antibiotics    initiation of antibiotics    ----------------------------     -----------------------------       PCT 0.25 - 0.50 ng/mL            PCT 0.10 - 0.25 ng/mL               OR       >80% decrease in PCT            Discourage initiation of                                             antibiotics      Encourage discontinuation           of antibiotics    ----------------------------     -----------------------------         PCT >= 0.50 ng/mL              PCT 0.26 - 0.50 ng/mL               AND        <80% decrease in PCT             Encourage initiation of                                             antibiotics       Encourage continuation           of antibiotics    ----------------------------     -----------------------------        PCT >= 0.50 ng/mL                  PCT > 0.50 ng/mL               AND         increase in PCT                  Strongly encourage                                      initiation of antibiotics    Strongly encourage escalation           of antibiotics                                     -----------------------------                                           PCT <= 0.25 ng/mL  OR                                        > 80% decrease in PCT                                      Discontinue / Do not initiate                                             antibiotics  Performed at [facility], [address] [place], [place] [address]   CBC with Differential/Platelet     Status: Abnormal   Collection Time: 01/01/21  1:57 AM  Result Value Ref Range   WBC 9.3 4.0 - 10.5 K/uL   RBC 2.43 (L) 3.87 - 5.11 MIL/uL   Hemoglobin 7.7 (L) 12.0 - 15.0 g/dL   HCT 63.8 (L) 46.6 - 59.9 %   MCV 95.9 80.0 - 100.0 fL   MCH 31.7 26.0 - 34.0 pg   MCHC 33.0 30.0 - 36.0 g/dL   RDW 35.7 01.7 - 79.3 %   Platelets 403 (H) 150 - 400 K/uL   nRBC 0.6 (H) 0.0 - 0.2 %   Neutrophils Relative % 91 %   Neutro Abs 8.5 (H) 1.7 - 7.7 K/uL   Lymphocytes Relative 5 %   Lymphs Abs 0.4 (L) 0.7 - 4.0 K/uL   Monocytes Relative 3 %   Monocytes Absolute 0.3 0.1 - 1.0 K/uL   Eosinophils Relative 0 %   Eosinophils Absolute 0.0 0.0 - 0.5 K/uL   Basophils Relative 0 %   Basophils Absolute 0.0 0.0 - 0.1 K/uL    Immature Granulocytes 1 %   Abs Immature Granulocytes 0.09 (H) 0.00 - 0.07 K/uL    Comment: Performed at [facility], [address], [place], [place] [address]  Magnesium     Status: None   Collection Time: 01/01/21  1:57 AM  Result Value Ref Range   Magnesium 2.0 1.7 - 2.4 mg/dL    Comment: Performed at [facility], [address] [place], [place] [address]  Basic metabolic panel     Status: Abnormal   Collection Time: 01/01/21  1:02 PM  Result Value Ref Range   Sodium 134 (L) 135 - 145 mmol/L   Potassium 3.6 3.5 - 5.1 mmol/L   Chloride 96 (L) 98 - 111 mmol/L   CO2 24 22 - 32 mmol/L   Glucose, Bld 302 (H) 70 - 99 mg/dL    Comment: Glucose reference range applies only to samples taken after fasting for at least 8 hours.   BUN 7 6 - 20 mg/dL   Creatinine, Ser 0.76 0.44 - 1.00 mg/dL   Calcium 8.3 (L) 8.9 - 10.3 mg/dL   GFR, Estimated >22 >63 mL/min    Comment: (NOTE) Calculated using the CKD-EPI Creatinine Equation (2021)    Anion gap 14 5 - 15    Comment: Performed at [facility], [address], [place], [place] [address]    Current Facility-Administered Medications  Medication Dose Route Frequency Provider Last Rate Last Admin  . acetaminophen (TYLENOL) tablet 650 mg  650 mg Oral Q6H PRN  Cora Collum, DO   650 mg at 01/01/21 0820   Or  . acetaminophen (TYLENOL) suppository 650 mg  650 mg Rectal Q6H PRN Cora Collum, DO      . albuterol (VENTOLIN HFA) 108 (90 Base) MCG/ACT inhaler 2 puff  2 puff Inhalation Q4H PRN Paige, Victoria J, DO      . amLODipine (NORVASC) tablet 10 mg  10 mg Oral Daily Dana Allan, MD   10 mg at 01/01/21 0815  . atorvastatin (LIPITOR) tablet 40 mg  40 mg Oral Daily Dana Allan, MD   40 mg at 01/01/21 0815  . [START ON 01/02/2021] cefTRIAXone (ROCEPHIN) 2 g in sodium chloride 0.9 % 100 mL IVPB  2 g Intravenous Q24H Westley Chandler, MD      . doxycycline (VIBRA-TABS) tablet 100 mg  100 mg Oral Q12H Dana Allan, MD   100 mg  at 01/01/21 1121  . enoxaparin (LOVENOX) injection 40 mg  40 mg Subcutaneous Q24H Paige, Victoria J, DO      . hydrOXYzine (ATARAX/VISTARIL) tablet 50 mg  50 mg Oral TID PRN Jovita Kussmaul, MD   50 mg at 12/31/20 2225  . insulin aspart (novoLOG) injection 0-15 Units  0-15 Units Subcutaneous TID WC Paige, Victoria J, DO      . mometasone-formoterol [facility]) 200-5 MCG/ACT inhaler 2 puff  2 puff Inhalation BID Idalia Needle, Victoria J, DO   2 puff at 01/01/21 1002  . ondansetron (ZOFRAN) tablet 4 mg  4 mg Oral Q12H PRN Cora Collum, DO   4 mg at 01/01/21 1254  . oxyCODONE (Oxy IR/ROXICODONE) immediate release tablet 2.5 mg  2.5 mg Oral Q6H PRN Dana Allan, MD   2.5 mg at 01/01/21 0309  . pantoprazole (PROTONIX) EC tablet 40 mg  40 mg Oral Daily Dana Allan, MD   40 mg at 01/01/21 0815  . perflutren lipid microspheres (DEFINITY) IV suspension  1-10 mL Intravenous PRN Westley Chandler, MD   4 mL at 01/01/21 1418  . QUEtiapine (SEROQUEL) tablet 300 mg  300 mg Oral QHS Lauro Franklin, MD      . Melene Muller ON 01/02/2021] sertraline (ZOLOFT) tablet 150 mg  150 mg Oral Daily Zoha Spranger, Mardelle Matte, MD        Musculoskeletal: Strength & Muscle Tone: within normal limits Gait & Station: unable to stand Patient leans: N/A            Psychiatric Specialty Exam:  Presentation  General Appearance: No data recorded Eye Contact:No data recorded Speech:No data recorded Speech Volume:No data recorded Handedness:No data recorded  Mood and Affect  Mood:No data recorded Affect:No data recorded  Thought Process  Thought Processes:No data recorded Descriptions of Associations:No data recorded Orientation:No data recorded Thought Content:No data recorded History of Schizophrenia/Schizoaffective disorder:No data recorded Duration of Psychotic Symptoms:No data recorded Hallucinations:No data recorded Ideas of Reference:No data recorded Suicidal Thoughts:No data recorded Homicidal Thoughts:No  data recorded  Sensorium  Memory:No data recorded Judgment:No data recorded Insight:No data recorded  Executive Functions  Concentration:No data recorded Attention Span:No data recorded Recall:No data recorded Fund of Knowledge:No data recorded Language:No data recorded  Psychomotor Activity  Psychomotor Activity:No data recorded  Assets  Assets:No data recorded  Sleep  Sleep:No data recorded  Physical Exam: Physical Exam Vitals and nursing note reviewed.  Constitutional:      General: She is not in acute distress.    Appearance: She is ill-appearing. She is not toxic-appearing.  HENT:     Head:  Normocephalic and atraumatic.  Cardiovascular:     Rate and Rhythm: Normal rate.  Pulmonary:     Comments: Impaired respirations  Neurological:     Mental Status: She is alert.    Review of Systems  Respiratory: Positive for cough and shortness of breath.   Cardiovascular: Positive for chest pain.  Gastrointestinal: Positive for nausea. Negative for constipation and diarrhea.  Neurological: Positive for headaches.  Psychiatric/Behavioral: Negative for hallucinations and suicidal ideas. The patient is nervous/anxious.    Blood pressure (!) 185/68, pulse 100, temperature 98.7 F (37.1 C), temperature source Oral, resp. rate 19, SpO2 95 %. There is no height or weight on file to calculate BMI.  Treatment Plan Summary: Case discussed with Dr. Bronwen Betters Daily contact with patient to assess and evaluate symptoms and progress in treatment  As she has been stable on her medications before her multiple medical issues feel that this acute destabilization can be treated with increases in her base medications. Her talking to herself appears to be anxiety driven not due to internal stimuli. Will continue to monitor.  -Increase Seroquel to 300 mg QHS -Increase Zoloft to 150 mg daily   Disposition: No evidence of imminent risk to self or others at present.    Lauro Franklin,  MD 01/01/2021 2:47 PM

## 2021-01-01 NOTE — Progress Notes (Signed)
Family Medicine Teaching Service Daily Progress Note Intern Pager: [phone]  Patient name: [name] Medical record number: [id number] Date of birth: 08/06/1960 Age: 60 y.o. Gender: female  Primary Care Provider: Arvilla Market, DO Consultants: psych Code Status: full  Pt Overview and Major Events to Date:  12/31/20  Assessment and Plan: [name] is a 60yo female presenting with acute hypoxic respiratory failure 2/2 HAP. PMH significant for AVN s/p total  Hip arthroplasty, alcohol abuse, cirrhosis, COPD, nicotine use.   Acute hypoxic hypocapnic respiratory failure 2/2 HAP and COPD  Satting 96% on RA. Wheezing in upper lobes bilaterally with increased WOB. Required albuterol this am. Procalcitonin. MRSA swab neg. D/c'd Vanc. D dimer elevated at 2.91. LA 2.0. CTA negative for PE  - continue Cefepime (5/30-) - f/u echo - f/u blood cx - f/u BNP this afternoon  - albuterol 2puffs q4prn  - symbicort daily  - incentive spirometry  - Tylenol 650mg  q 6 prn  - Oxycodone 2.5mg  q 6 h prn  - PT/OT  Hypokalemia K+ 3.1 after x2 ON. Currently repleting with IV  - check pm BMP   DM2 Blood glucose 298 this am. Has not required insulin. Discussed with nurse checking CBGs and patient declining. Will discuss importance with patient  - mSSI - CBG checks before meals and at night   HTN BP 140/74 this am. Tachycardic to 108 likely in the setting of pain  - continue amlodipine 10mg   Bilateral LE pain Still endorses LE pain primarily on R side. DVT negative. 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 Likely due to blood loss from recent surgery. Transfusion threshold 8 -  continue to monitor - am CBC   Hx of liver cirrhosis  Hx alcohol abuse AST/ALT wnl. Denies current alcohol use, stating last drank alcohol about 3 years ago  Nicotine dependence Reports smoking .5ppd   - counsel on nicotine cessation   Protein calorie malnutrition Albumin 2.7, total protein 6.1 likely in the setting of low dietary intake  - consider ensure shakes for supplementation and MVI   FEN/GI:carb modified diet  PPx: Lovenox   Disposition: Med-tele   Subjective:  No acute events overnight. This morning patient alert and oriented. Has insight to what brought her in. Denies hallucinations. Endorses continued LE pain, due to next dose of pain medication.   Objective: Temp:  [98.5 F (36.9 C)-101 F (38.3 C)] 98.9 F (37.2 C) (05/31 0326) Pulse Rate:  [71-107] 97 (05/31 0326) Resp:  [17-25] 20 (05/31 0326) BP: (101-150)/(51-126) 128/62 (05/31 0326) SpO2:  [87 %-100 %] 96 % (05/31 0326) Physical Exam: General: alert, NAD Cardiovascular: Tachycardic. Regular rhythm  Respiratory: wheezing in upper lobes bilaterally. Increased WOB. Satting 96% on RA  Abdomen: soft, non distended, non tender  Extremities: warm, dry. 1+ pitting edema bilaterally up to calf. Moving spontaneously   Laboratory: Recent Labs  Lab 12/31/20 1008 12/31/20 1056 12/31/20 1913 01/01/21 0157  WBC 11.9*  --  12.5* 9.3  HGB 8.3* 7.8* 8.6* 7.7*  HCT 25.5* 23.0* 26.3* 23.3*  PLT 434*  --  479* 403*   Recent Labs  Lab 12/31/20  1008 12/31/20 1056 12/31/20 1913 01/01/21 0157  NA 139 132* 135 134*  K 2.9* 2.1* 2.7* 3.1*  CL 98  --  93* 95*  CO2 29  --  28 29  BUN 7  --  5* 5*  CREATININE 0.74  --  0.75 0.67  CALCIUM 8.3*  --  8.2* 8.1*  PROT 6.1*  --   --  5.9*  BILITOT 1.2  --   --  0.9  ALKPHOS 89  --   --  95  ALT 19  --   --  18  AST 19  --   --  18  GLUCOSE 191*  --  242* 298*     Imaging/Diagnostic Tests: CT Head Wo Contrast  Result Date: 12/31/2020 CLINICAL DATA:   60 year old female with history of mental status change. EXAM: CT HEAD WITHOUT CONTRAST TECHNIQUE: Contiguous axial images were obtained from the base of the skull through the vertex without intravenous contrast. COMPARISON:  No priors. FINDINGS: Brain: No evidence of acute infarction, hemorrhage, hydrocephalus, extra-axial collection or mass lesion/mass effect. Vascular: No hyperdense vessel or unexpected calcification. Skull: Normal. Negative for fracture or focal lesion. Sinuses/Orbits: No acute finding. Other: None. IMPRESSION: 1. No acute intracranial abnormalities. The appearance of the brain is normal. Electronically Signed   By: Trudie Reed M.D.   On: 12/31/2020 13:17   CT ANGIO CHEST PE W OR WO CONTRAST  Result Date: 12/31/2020 CLINICAL DATA:  Shortness of breath EXAM: CT ANGIOGRAPHY CHEST WITH CONTRAST TECHNIQUE: Multidetector CT imaging of the chest was performed using the standard protocol during bolus administration of intravenous contrast. Multiplanar CT image reconstructions and MIPs were obtained to evaluate the vascular anatomy. CONTRAST:  96mL OMNIPAQUE IOHEXOL 350 MG/ML SOLN COMPARISON:  Chest x-ray 12/31/2020 FINDINGS: Cardiovascular: Satisfactory opacification of the pulmonary arteries to the segmental level. No evidence of pulmonary embolism. Normal heart size. No pericardial effusion. Moderate aortic atherosclerosis. No aneurysm. No dissection. Mediastinum/Nodes: Midline trachea. No thyroid mass. Esophagus within normal limits. No suspicious adenopathy. Lungs/Pleura: Emphysema. Ground-glass density and patchy consolidation within the right greater than left upper lobe, and the right middle lobe. Partial atelectasis or consolidation in the right lower lobe. Trace left and small right pleural effusion. No pneumothorax Upper Abdomen: No acute abnormality. Musculoskeletal: No chest wall abnormality. No acute or significant osseous findings. Review of the MIP images confirms the above  findings. IMPRESSION: 1. No CT evidence for acute pulmonary embolus. 2. Emphysema. Multifocal right greater than left ground-glass densities and patchy consolidations probably representing pneumonia. Small right and trace left pleural effusions. Aortic Atherosclerosis (ICD10-I70.0). Electronically Signed   By: Jasmine Pang M.D.   On: 12/31/2020 21:12   DG Chest Port 1 View  Result Date: 12/31/2020 CLINICAL DATA:  Shortness of breath EXAM: PORTABLE CHEST 1 VIEW COMPARISON:  12/17/2020 FINDINGS: The heart size and mediastinal contours are within normal limits. Heterogeneous airspace opacity of the right mid and lower lung. The visualized skeletal structures are unremarkable. IMPRESSION: Heterogeneous airspace opacity of the right mid and lower lung, consistent with infection or aspiration. Electronically Signed   By: Lauralyn Primes M.D.   On: 12/31/2020 15:06   VAS [place] LOWER EXTREMITY VENOUS (DVT) (ONLY [facility] & [facility])  Result Date: 12/31/2020  Lower Venous DVT Study Patient Name:  SUNSHINE MACKOWSKI  Date of Exam:   12/31/2020 Medical Rec #: [id number]     Accession #:    [id number] Date of Birth: 12/01/1960     Patient Gender: F Patient  Age:   [age]Y [facility]am [facility] Procedure:      VAS US LOWER EXTREMITY VENOUS (DVT) Referring Phys: [id number] ABIGAIL HARRIS --------------------------------------------------------------------------------  Indications: S/p right hip replacement x1 week, pitting edema.  Limitations: Body habitus, poor ultrasound/tissue interface and patient position. Comparison Study: No prior study Performing Technologist: Gertie FeyMichelle Simonetti MHA, RDMS, RVT, RDCS  Examination Guidelines: A complete evaluation includes B-mode imaging, spectral Doppler, color Doppler, and power Doppler as needed of all accessible portions of each vessel. Bilateral testing is considered an integral part of a complete examination. Limited examinations for reoccurring indications may be performed as noted. The reflux  portion of the exam is performed with the patient in reverse Trendelenburg.  +---------+---------------+---------+-----------+----------+--------------+ RIGHT    CompressibilityPhasicitySpontaneityPropertiesThrombus Aging +---------+---------------+---------+-----------+----------+--------------+ CFV      Full           Yes      Yes                                 +---------+---------------+---------+-----------+----------+--------------+ SFJ      Full                                                        +---------+---------------+---------+-----------+----------+--------------+ FV Prox  Full                                                        +---------+---------------+---------+-----------+----------+--------------+ FV Mid   Full                                                        +---------+---------------+---------+-----------+----------+--------------+ FV DistalFull                                                        +---------+---------------+---------+-----------+----------+--------------+ PFV      Full                                                        +---------+---------------+---------+-----------+----------+--------------+ POP      Full           Yes      Yes                                 +---------+---------------+---------+-----------+----------+--------------+ PTV      Full                                                        +---------+---------------+---------+-----------+----------+--------------+  Right Technical Findings: Not visualized segments include peroneal veins.  Left Technical Findings: Not visualized segments include CFV.   Summary: RIGHT: - There is no evidence of deep vein thrombosis in the lower extremity. However, portions of this examination were limited- see technologist comments above.  - No cystic structure found in the popliteal fossa.   *See table(s) above for measurements and observations.  Electronically signed by Sherald Hess MD on 12/31/2020 at 6:26:55 PM.    Final     Cora Collum, DO 01/01/2021, 6:59 AM PGY-1, [facility] Family Medicine FPTS Intern pager: [phone], text pages welcome

## 2021-01-01 NOTE — Progress Notes (Signed)
Pt rambling with speech at times, very fidgety. Pt peeled foil back from tea drink, then tried to stab foil with straw to drink tea. Teaching Services has been notified.

## 2021-01-01 NOTE — Progress Notes (Signed)
Occupational Therapy Evaluation Patient Details Name: [name] MRN: [id number] DOB: 12-Jul-1961 Today's Date: 01/01/2021    History of Present Illness Patient is a 60 y/o female who presents on 12/31/20 with SOB, AMS, UI and LE pain. Recent Rt THA 12/24/20. Admitted with acute hypoxic hypocapnic respiratory failure secondary to PNA. PMH includes cirrhosis, COPD, HTN, depression and anxiety.   Clinical Impression   Pt seen for evaluation, with recent PMH including R THA after AVN from 1 week ago. Presenting with reports of increased anxiousness repeating through session 'i'm 59/yo and I know what I can and can not do.' at baseline prior to initial procedure pt was mod indep with all ADL's and mobility, recent transitioned to living with parents who are limited in ability to provide physical A. Currently pt requiring min guard grossly for functional transfers from recliner (declinling bed mobility this date), and min-mod A for LB ADL's and bathing simulated d/t discomfort and ?restrictions in ROM to R hip. Unclear. Overall pt presents as excellent candidate for HHOT to review home safety techniques and modification/DME to maximize indep and safety and hopefully improved confidence decreasing overall reports of anxiousness. OT will continue to follow acutely for further education and training with recommendations listed below.     Follow Up Recommendations  Home health OT;Supervision/Assistance - 24 hour    Equipment Recommendations  Tub/shower bench    Recommendations for Other Services       Precautions / Restrictions Precautions Precautions: Fall Restrictions Weight Bearing Restrictions: No RLE Weight Bearing: Weight bearing as tolerated Other Position/Activity Restrictions: WBAT      Mobility Bed Mobility Overal bed mobility: Needs Assistance Bed Mobility: Supine to Sit     Supine to sit: Supervision;HOB elevated     General bed mobility comments: Increased time/effort and  use of rail, no assist needed.    Transfers Overall transfer level: Needs assistance Equipment used: Rolling walker (2 wheeled) Transfers: Sit to/from Stand Sit to Stand: Min guard         General transfer comment: min guard and cues for safety with standing and simulated transfers, with reliance on RW for safety    Balance Overall balance assessment: Needs assistance Sitting-balance support: Feet supported;No upper extremity supported Sitting balance-Leahy Scale: Good     Standing balance support: During functional activity Standing balance-Leahy Scale: Poor Standing balance comment: Requires UE support in standing.                           ADL either performed or assessed with clinical judgement   ADL Overall ADL's : Needs assistance/impaired Eating/Feeding: Set up;Sitting Eating/Feeding Details (indicate cue type and reason): in recliner Grooming: Min guard;Standing Grooming Details (indicate cue type and reason): simulatd standing at beside table, decreased tolerance for standing this date     Lower Body Bathing: Minimal assistance;Sit to/from stand;Cueing for safety;Cueing for sequencing Lower Body Bathing Details (indicate cue type and reason): seated in recliner, CGA for donning over hips Upper Body Dressing : Set up;Sitting   Lower Body Dressing: Minimal assistance;Sitting/lateral leans;Sit to/from stand Lower Body Dressing Details (indicate cue type and reason): simlulated threading, introduced to long handled tools for increased ease Toilet Transfer: RW;Min Pension scheme manager Details (indicate cue type and reason): increased reports of anxiousness with WB, discomfort Toileting- Clothing Manipulation and Hygiene: Min Print production planner Details (indicate cue type and reason): deferred   General ADL Comments: currently pt presents with decreased  understanding of modifications/DME to maximize indep and safety with LB dressing,  would  benefit from further education and training on Long handled tools and DMe.     Vision         Perception     Praxis      Pertinent Vitals/Pain Pain Assessment: No/denies pain Pain Score: 4  Pain Location: bil hips/head, back Pain Descriptors / Indicators: Grimacing;Sore;Aching;Guarding Pain Intervention(s): Limited activity within patient's tolerance     Hand Dominance Right   Extremity/Trunk Assessment Upper Extremity Assessment Upper Extremity Assessment: Overall WFL for tasks assessed   Lower Extremity Assessment Lower Extremity Assessment: Defer to PT evaluation RLE Deficits / Details: Grossly ~3/5 throughout. Reports numbness at hip. RLE Sensation: decreased light touch LLE Deficits / Details: Reports numbness at hip LLE Sensation: decreased light touch   Cervical / Trunk Assessment Cervical / Trunk Assessment: Normal   Communication Communication Communication: No difficulties   Cognition Arousal/Alertness: Awake/alert Behavior During Therapy: Anxious Overall Cognitive Status: No family/caregiver present to determine baseline cognitive functioning                                 General Comments: Highly anxious about everything, Masks with laughing/humor. Exaggerated SOB noted at times with deep breaths. Reports most of her anxiety stems from worrying about her parents having to help her at home; waiting to move into a new apt.   General Comments  not able to get Sp02 reading but pt very dyspneic with minimal activity. HR up to 126 bpm.    Exercises     Shoulder Instructions      Home Living Family/patient expects to be discharged to:: Private residence Living Arrangements: Parent Available Help at Discharge: Family;Available 24 hours/day Type of Home: House Home Access: Ramped entrance     Home Layout: One level     Bathroom Shower/Tub: Chief Strategy Officer: Standard Bathroom Accessibility: No   Home Equipment: Cane  - single point;Bedside commode;Walker - 2 wheels   Additional Comments: pt reports her mother and aunt have DME available      Prior Functioning/Environment Level of Independence: Needs assistance  Gait / Transfers Assistance Needed: uses w/c or SPC for mobility as needed throughout the home. also has access to w/c. ADL's / Homemaking Assistance Needed: increased A per report for threading LB ADL's, did not complete IADL's since recent hip replacement   Comments: participating with HHPT, no OT currently active but would strongly recommend        OT Problem List: Decreased activity tolerance;Impaired balance (sitting and/or standing);Decreased safety awareness;Decreased knowledge of use of DME or AE;Decreased knowledge of precautions      OT Treatment/Interventions: Self-care/ADL training;DME and/or AE instruction;Energy conservation;Therapeutic activities;Patient/family education;Balance training    OT Goals(Current goals can be found in the care plan section) Acute Rehab OT Goals Patient Stated Goal: get my own apt and be able to take care of myself OT Goal Formulation: With patient Time For Goal Achievement: 01/16/21 Potential to Achieve Goals: Good  OT Frequency: Min 2X/week   Barriers to D/C:            Co-evaluation              AM-PAC OT "6 Clicks" Daily Activity     Outcome Measure Help from another person eating meals?: None Help from another person taking care of personal grooming?: A Little Help from another person toileting, which  includes using toliet, bedpan, or urinal?: A Little Help from another person bathing (including washing, rinsing, drying)?: A Little Help from another person to put on and taking off regular upper body clothing?: A Little Help from another person to put on and taking off regular lower body clothing?: A Little 6 Click Score: 19   End of Session Equipment Utilized During Treatment: Gait belt;Rolling walker  Activity Tolerance:  Patient limited by fatigue Patient left: in chair;with call bell/phone within reach  OT Visit Diagnosis: Unsteadiness on feet (R26.81)                Time: 1127-1140 OT Time Calculation (min): 13 min Charges:  OT General Charges $OT Visit: 1 Visit OT Evaluation $OT Eval Low Complexity: 1 Low  Lucianna Ostlund OTR/L acute rehab services Office: [phone]  01/01/2021, 1:01 PM

## 2021-01-01 NOTE — Plan of Care (Signed)
Patient remains afebrile. Denies pain or discomfort at this time.  Safety precautions maintained.

## 2021-01-01 NOTE — Progress Notes (Signed)
Pt refusing all finger sticks

## 2021-01-01 NOTE — Evaluation (Signed)
Physical Therapy Evaluation Patient Details Name: [name] MRN: [id number] DOB: 1960-09-27 Today's Date: 01/01/2021   History of Present Illness  Patient is a 60 y/o female who presents on 12/31/20 with SOB, AMS, UI and LE pain. Recent Rt THA 12/24/20. Admitted with acute hypoxic hypocapnic respiratory failure secondary to PNA. PMH includes cirrhosis, COPD, HTN, depression and anxiety.  Clinical Impression  Patient presents with anxiety, pain, decreased sensation of bil hips, decreased activity tolerance, dyspnea and impaired mobility s/p above. Pt lives at home with parents and reports being Mod I for ADLs and using RW for ambulation since Rt THA last week. Pt reports she has been getting HHPT however feels that they push her too hard. Today, pt tolerated transfers and short distance ambulation with Min guard assist and use of RW for support; Noted to have 3/4 DOE. Not able to get 02 reading. Anxiety seems to be pt's biggest limiting factor. Utilized relaxation and breathing techniques. Also of note, reporting numbness in bil hips which is new and increased urinary frequency with mobility. Encouraged OOB and increasing activity. Will follow acutely to maximize independence and mobility prior to return home.    Follow Up Recommendations Home health PT;Supervision - Intermittent (continue HHPT)    Equipment Recommendations  None recommended by PT    Recommendations for Other Services       Precautions / Restrictions Precautions Precautions: Fall Restrictions Weight Bearing Restrictions: Yes RLE Weight Bearing: Weight bearing as tolerated      Mobility  Bed Mobility Overal bed mobility: Needs Assistance Bed Mobility: Supine to Sit     Supine to sit: Supervision;HOB elevated     General bed mobility comments: Increased time/effort and use of rail, no assist needed.    Transfers Overall transfer level: Needs assistance Equipment used: Rolling walker (2 wheeled) Transfers: Sit  to/from Stand Sit to Stand: Min guard         General transfer comment: Min guard for safety. Stood from [employer], transferred to chair post ambulation.  Ambulation/Gait Ambulation/Gait assistance: Min guard Gait Distance (Feet): 20 Feet Assistive device: Rolling walker (2 wheeled) Gait Pattern/deviations: Step-through pattern;Decreased stride length;Trunk flexed Gait velocity: decreased   General Gait Details: Slow, mildly unsteady gait with 3/4 DOE. Not able to get 02 reading. Increased time with a few standing rest breaks. Slight circumduction noted to advance RLE.  Stairs            Wheelchair Mobility    Modified Rankin (Stroke Patients Only)       Balance Overall balance assessment: Needs assistance Sitting-balance support: Feet supported;No upper extremity supported Sitting balance-Leahy Scale: Good     Standing balance support: During functional activity Standing balance-Leahy Scale: Poor Standing balance comment: Requires UE support in standing.                             Pertinent Vitals/Pain Pain Assessment: 0-10 Pain Score: 6  Pain Location: bil hips/head, back Pain Descriptors / Indicators: Grimacing;Sore;Aching;Guarding Pain Intervention(s): Monitored during session;Limited activity within patient's tolerance;Repositioned;Utilized relaxation techniques;Heat applied    Home Living Family/patient expects to be discharged to:: Private residence Living Arrangements: Parent Available Help at Discharge: Family;Available 24 hours/day Type of Home: House Home Access: Ramped entrance     Home Layout: One level Home Equipment: Bedside commode;Walker - 2 wheels Additional Comments: has plently of DME that her parents aren't using; pt reports she is waiting on her apartment  Prior Function Level of Independence: Needs assistance   Gait / Transfers Assistance Needed: Using RW for ambulation since coming home from [facility] after hip  replacement.  ADL's / Homemaking Assistance Needed: Able to dress self. Does not do IADLs since surgery. Reports increased urgency to urinate everytime she gets up at her house.  Comments: Has been getting HHPT but reports they work her too hard.     Hand Dominance   Dominant Hand: Right    Extremity/Trunk Assessment   Upper Extremity Assessment Upper Extremity Assessment: Defer to OT evaluation    Lower Extremity Assessment Lower Extremity Assessment: RLE deficits/detail;LLE deficits/detail RLE Deficits / Details: Grossly ~3/5 throughout. Reports numbness at hip. RLE Sensation: decreased light touch LLE Deficits / Details: Reports numbness at hip LLE Sensation: decreased light touch    Cervical / Trunk Assessment Cervical / Trunk Assessment: Normal  Communication   Communication: No difficulties  Cognition Arousal/Alertness: Awake/alert Behavior During Therapy: Anxious Overall Cognitive Status: No family/caregiver present to determine baseline cognitive functioning                                 General Comments: Highly anxious about everything, Masks with laughing/humor. Exaggerated SOB noted at times with deep breaths. Reports most of her anxiety stems from worrying about her parents having to help her at home; waiting to move into a new apt.      General Comments General comments (skin integrity, edema, etc.): not able to get Sp02 reading but pt very dyspneic with minimal activity. HR up to 126 bpm.    Exercises     Assessment/Plan    PT Assessment Patient needs continued PT services  PT Problem List Decreased strength;Decreased mobility;Decreased range of motion;Decreased activity tolerance;Decreased balance;Pain;Impaired sensation;Cardiopulmonary status limiting activity;Decreased skin integrity       PT Treatment Interventions DME instruction;Therapeutic activities;Gait training;Functional mobility training;Therapeutic exercise;Patient/family  education;Balance training    PT Goals (Current goals can be found in the Care Plan section)  Acute Rehab PT Goals Patient Stated Goal: get my own apt and be able to take care of myself PT Goal Formulation: With patient Time For Goal Achievement: 01/15/21 Potential to Achieve Goals: Good    Frequency Min 5X/week   Barriers to discharge        Co-evaluation               AM-PAC PT "6 Clicks" Mobility  Outcome Measure Help needed turning from your back to your side while in a flat bed without using bedrails?: None Help needed moving from lying on your back to sitting on the side of a flat bed without using bedrails?: None Help needed moving to and from a bed to a chair (including a wheelchair)?: A Little Help needed standing up from a chair using your arms (e.g., wheelchair or bedside chair)?: A Little Help needed to walk in hospital room?: A Little Help needed climbing 3-5 steps with a railing? : A Lot 6 Click Score: 19    End of Session Equipment Utilized During Treatment: Gait belt Activity Tolerance: Other (comment);Patient limited by fatigue (dyspnea, anxiety) Patient left: in chair;with call bell/phone within reach;with nursing/sitter in room Nurse Communication: Mobility status PT Visit Diagnosis: Other abnormalities of gait and mobility (R26.89);Difficulty in walking, not elsewhere classified (R26.2)    Time: 1020-1050 PT Time Calculation (min) (ACUTE ONLY): 30 min   Charges:   PT Evaluation $PT Eval Moderate Complexity: 1  Mod PT Treatments $Therapeutic Activity: 8-22 mins        Vale Haven, PT, DPT Acute Rehabilitation Services Pager [phone] Office [phone]      Marcy Panning 01/01/2021, 12:41 PM

## 2021-01-02 ENCOUNTER — Inpatient Hospital Stay (HOSPITAL_COMMUNITY): Payer: Medicaid Other

## 2021-01-02 DIAGNOSIS — F419 Anxiety disorder, unspecified: Secondary | ICD-10-CM

## 2021-01-02 DIAGNOSIS — Z72820 Sleep deprivation: Secondary | ICD-10-CM

## 2021-01-02 DIAGNOSIS — Z96641 Presence of right artificial hip joint: Secondary | ICD-10-CM

## 2021-01-02 DIAGNOSIS — G9341 Metabolic encephalopathy: Secondary | ICD-10-CM

## 2021-01-02 DIAGNOSIS — M25559 Pain in unspecified hip: Secondary | ICD-10-CM

## 2021-01-02 LAB — CBC
HCT: 24.5 % — ABNORMAL LOW (ref 36.0–46.0)
Hemoglobin: 7.8 g/dL — ABNORMAL LOW (ref 12.0–15.0)
MCH: 31.5 pg (ref 26.0–34.0)
MCHC: 31.8 g/dL (ref 30.0–36.0)
MCV: 98.8 fL (ref 80.0–100.0)
Platelets: 519 10*3/uL — ABNORMAL HIGH (ref 150–400)
RBC: 2.48 MIL/uL — ABNORMAL LOW (ref 3.87–5.11)
RDW: 12.3 % (ref 11.5–15.5)
WBC: 14.7 10*3/uL — ABNORMAL HIGH (ref 4.0–10.5)
nRBC: 0.8 % — ABNORMAL HIGH (ref 0.0–0.2)

## 2021-01-02 LAB — BASIC METABOLIC PANEL
Anion gap: 14 (ref 5–15)
BUN: 6 mg/dL (ref 6–20)
CO2: 26 mmol/L (ref 22–32)
Calcium: 8.7 mg/dL — ABNORMAL LOW (ref 8.9–10.3)
Chloride: 97 mmol/L — ABNORMAL LOW (ref 98–111)
Creatinine, Ser: 0.78 mg/dL (ref 0.44–1.00)
GFR, Estimated: >60 mL/min (ref >60)
Glucose, Bld: 189 mg/dL — ABNORMAL HIGH (ref 70–99)
Potassium: 3.2 mmol/L — ABNORMAL LOW (ref 3.5–5.1)
Sodium: 137 mmol/L (ref 135–145)

## 2021-01-02 LAB — GLUCOSE, CAPILLARY
Glucose-Capillary: 197 mg/dL — ABNORMAL HIGH (ref 70–99)
Glucose-Capillary: 180 mg/dL — ABNORMAL HIGH (ref 70–99)
Glucose-Capillary: 181 mg/dL — ABNORMAL HIGH (ref 70–99)
Glucose-Capillary: 178 mg/dL — ABNORMAL HIGH (ref 70–99)

## 2021-01-02 LAB — FERRITIN: Ferritin: 61 ng/mL (ref 11–307)

## 2021-01-02 LAB — PROCALCITONIN: Procalcitonin: <0.1 ng/mL

## 2021-01-02 IMAGING — DX DG LUMBAR SPINE 1V
1 series · 1 of 1 positions shown · non-contrast
Comparison: Lumbar spine radiograph dated [DATE].

CLINICAL DATA: 59-year-old female with back pain.

EXAM:
LUMBAR SPINE - 1 VIEW

[l-spine ap]
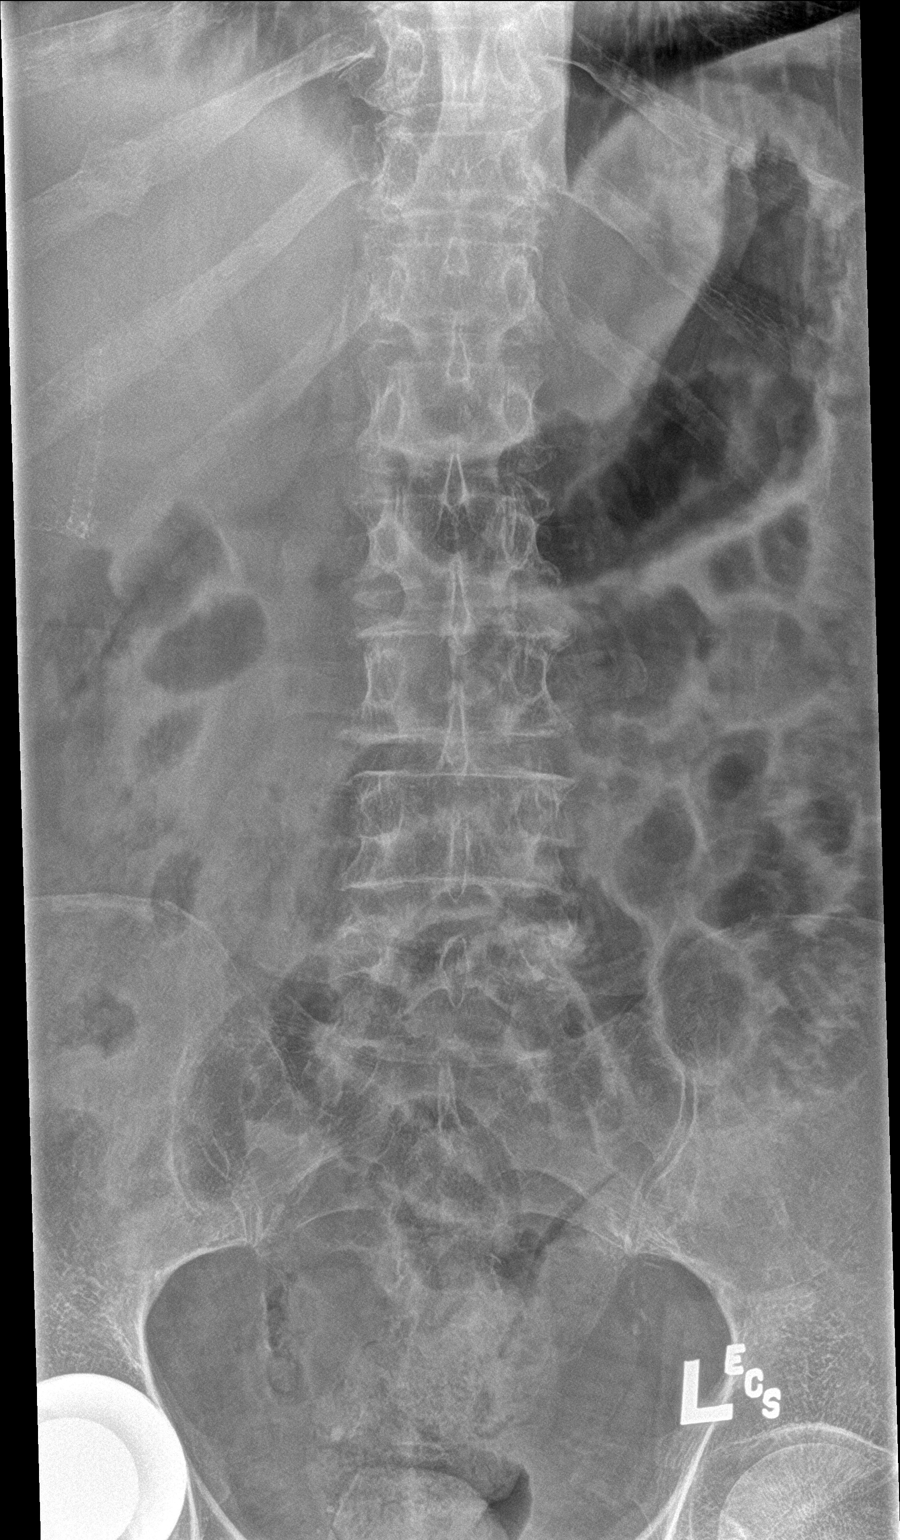

[1 of 1 positions shown; findings below may reference images not displayed]

FINDINGS: Five lumbar type vertebra. No acute fracture or subluxation
identified. Evaluation however is limited due to osteopenia and in
the absence of lateral views. Partially visualized total right hip
arthroplasty. Old fracture of the posterior right tenth rib. The
soft tissues are unremarkable.
IMPRESSION: 1. No acute fracture or subluxation.
2. Osteopenia.

## 2021-01-02 IMAGING — DX DG HIP (WITH OR WITHOUT PELVIS) 2-3V*R*
3 series · 3 of 3 positions shown · non-contrast
Comparison: Intraop films from [DATE]

CLINICAL DATA: Right hip pain, history of prior hip replacement

EXAM:
DG HIP (WITH OR WITHOUT PELVIS) 2-3V RIGHT

[pelvis ap]
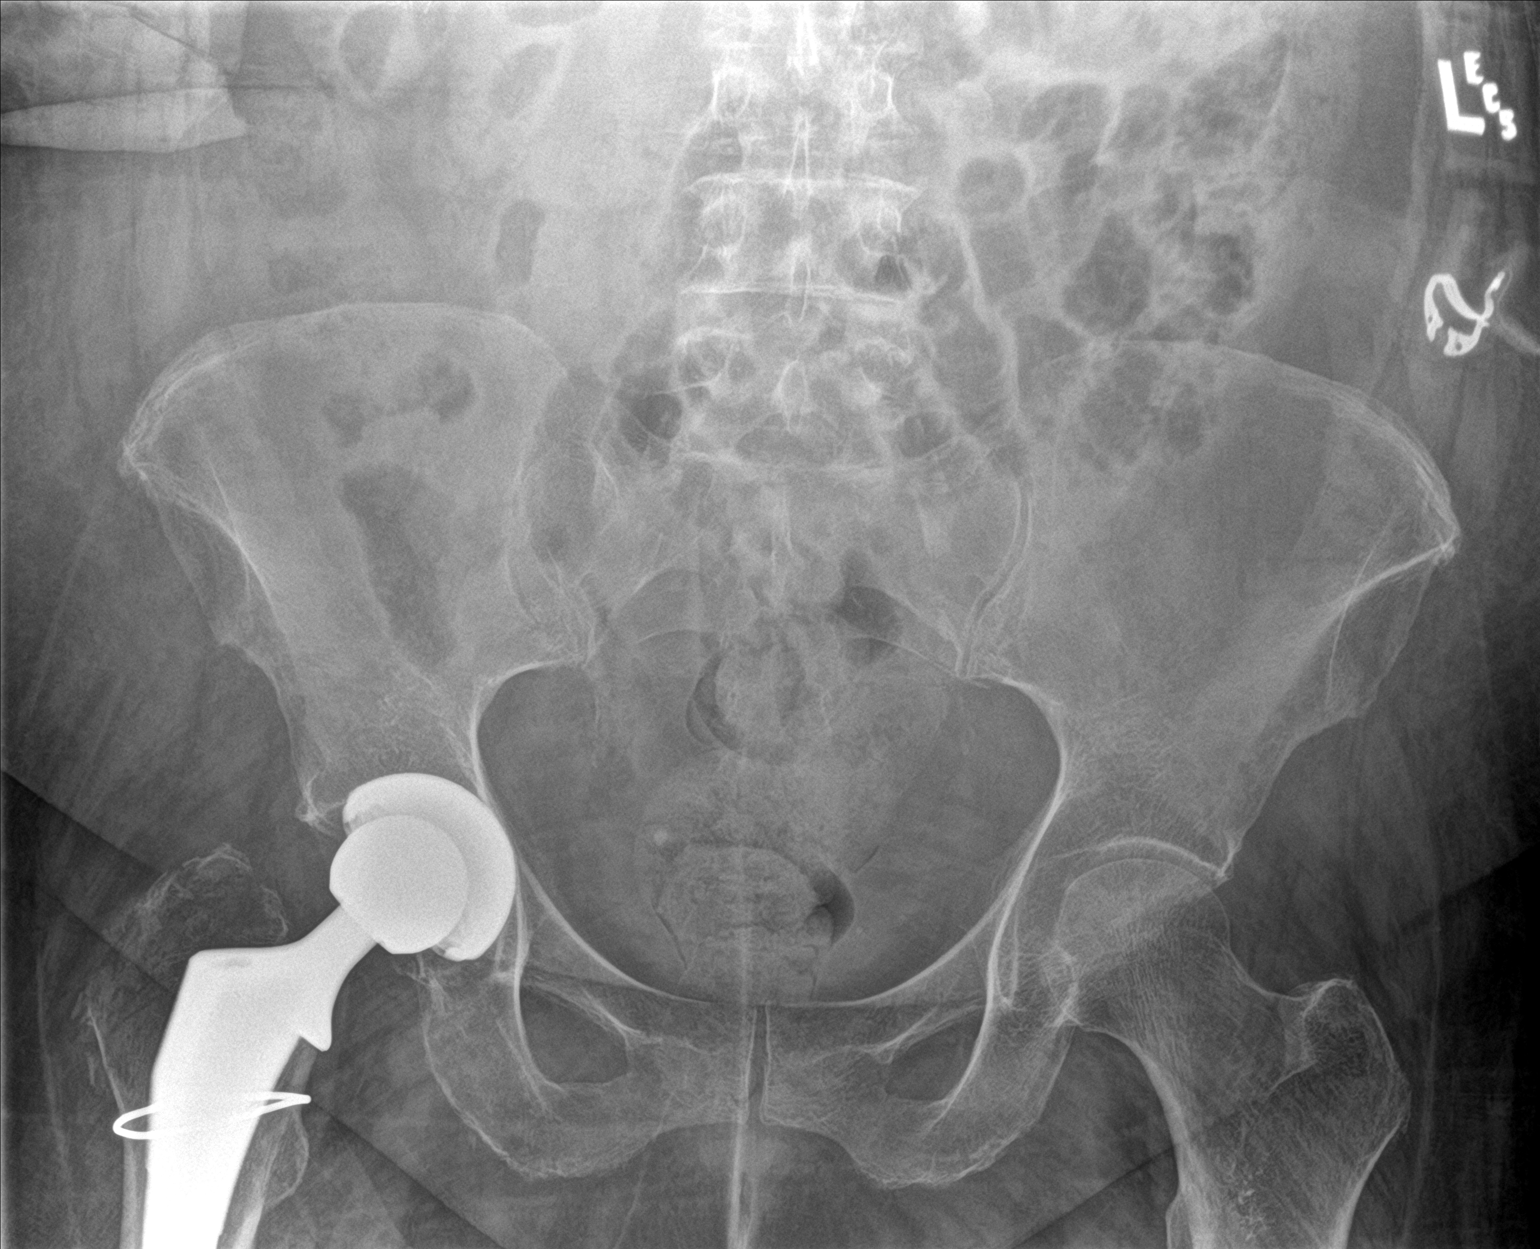

[hip ap]
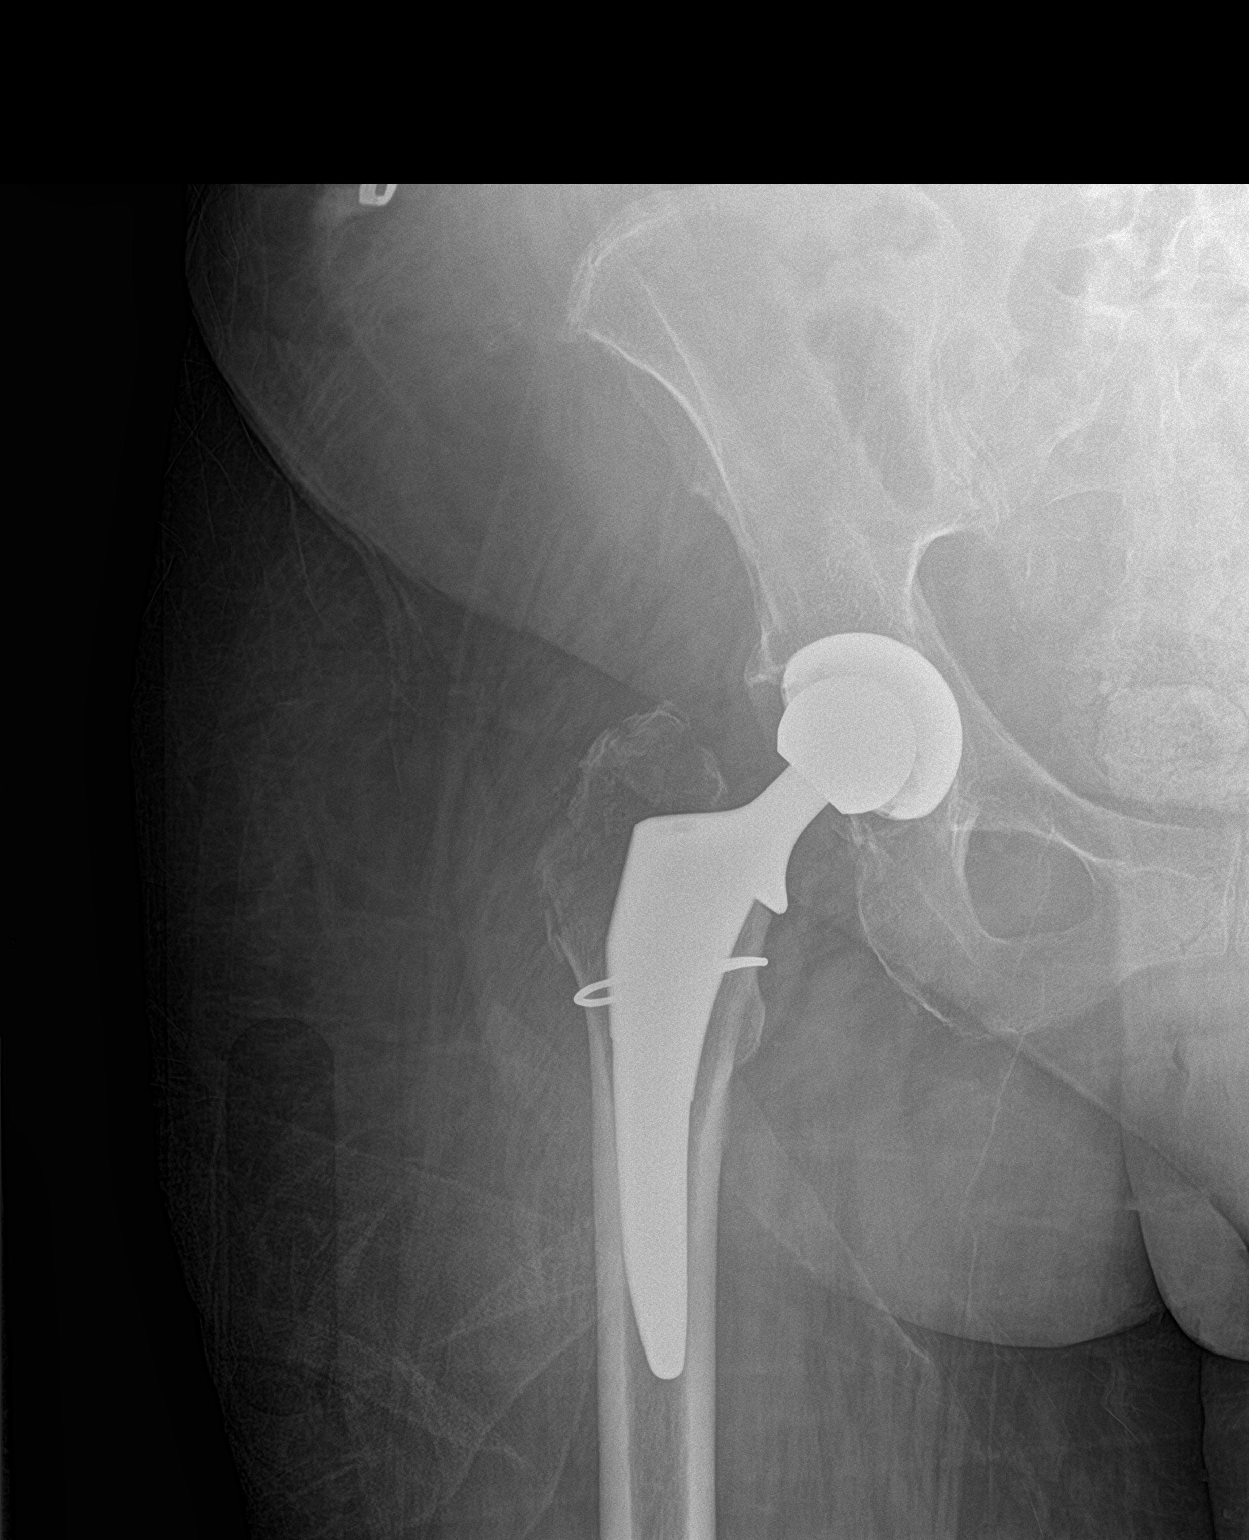

[hip lat]
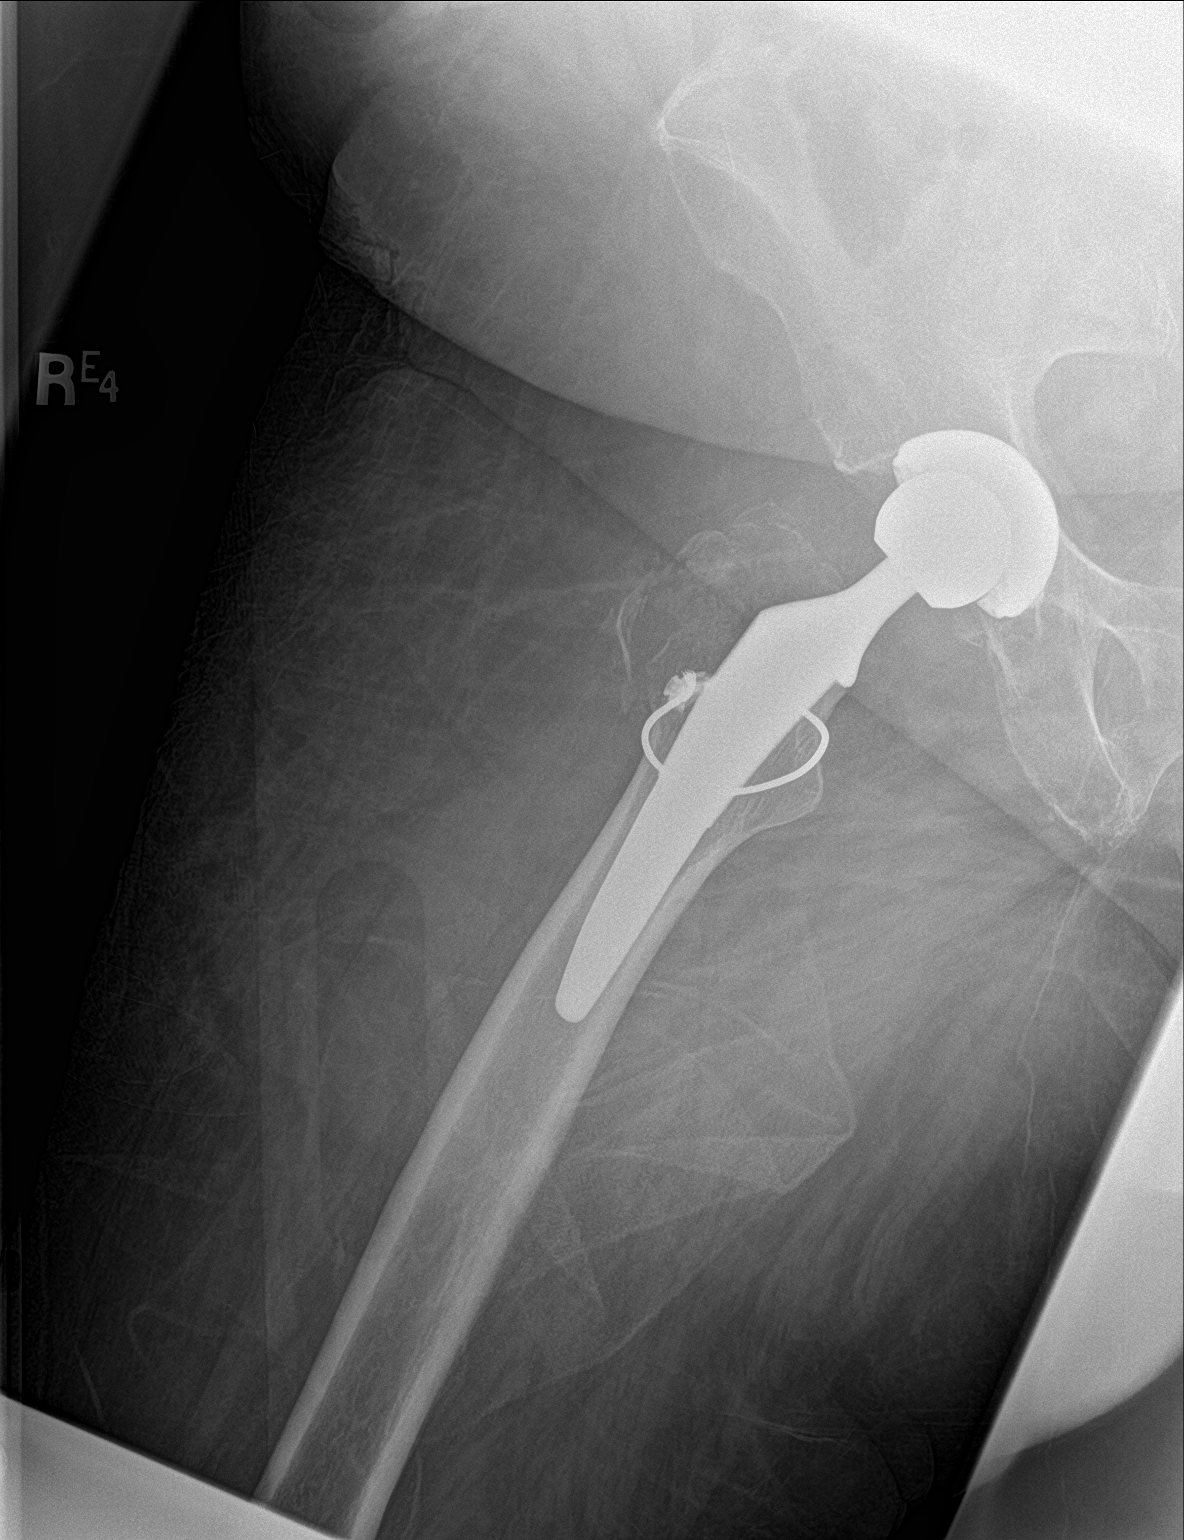

[3 of 3 positions shown; findings below may reference images not displayed]

FINDINGS: Pelvic ring is intact. Right hip replacement is noted in
satisfactory position. Previously seen radiopaque density on
intraoperative films has been removed. Cerclage wire is noted along
the proximal femur. Small bony fragment is noted adjacent to the
greater trochanter of uncertain chronicity. No significant callus
formation is noted. Correlate with any recent injury.
IMPRESSION: Right hip replacement in satisfactory position.

Mild fragmentation of the greater trochanter is noted of uncertain
chronicity. Correlate with any recent injury.

## 2021-01-02 MED ORDER — UMECLIDINIUM BROMIDE 62.5 MCG/INH IN AEPB
1.0000 | INHALATION_SPRAY | Freq: Every day | RESPIRATORY_TRACT | Status: DC
  Filled 2021-01-02: qty 7

## 2021-01-02 MED ORDER — POTASSIUM CHLORIDE CRYS ER 20 MEQ PO TBCR
40.0000 meq | EXTENDED_RELEASE_TABLET | ORAL | Status: AC
  Administered 2021-01-02 (×3): 40 meq via ORAL
  Filled 2021-01-02 (×3): qty 2

## 2021-01-02 MED ORDER — POTASSIUM CHLORIDE 20 MEQ PO PACK: 40.0000 meq | PACK | ORAL | Status: AC

## 2021-01-02 MED ORDER — ALBUTEROL SULFATE (2.5 MG/3ML) 0.083% IN NEBU
INHALATION_SOLUTION | RESPIRATORY_TRACT | Status: AC
  Administered 2021-01-02: 2.5 mg
  Filled 2021-01-02: qty 3

## 2021-01-02 MED ORDER — LOSARTAN POTASSIUM 25 MG PO TABS
25.0000 mg | ORAL_TABLET | Freq: Every day | ORAL | Status: DC
  Administered 2021-01-02 – 2021-01-03 (×2): 25 mg via ORAL
  Filled 2021-01-02 (×2): qty 1

## 2021-01-02 MED ORDER — ALBUTEROL SULFATE (2.5 MG/3ML) 0.083% IN NEBU: 2.5000 mg | INHALATION_SOLUTION | Freq: Four times a day (QID) | RESPIRATORY_TRACT | Status: DC | PRN

## 2021-01-02 NOTE — Progress Notes (Signed)
Physical Therapy Treatment Patient Details Name: [name] MRN: [id number] DOB: 1960/08/23 Today's Date: 01/02/2021    History of Present Illness Patient is a 60 y/o female who presents on 12/31/20 with SOB, AMS, UI and LE pain. Recent Rt THA 12/24/20. Admitted with acute hypoxic hypocapnic respiratory failure secondary to PNA. PMH includes cirrhosis, COPD, HTN, depression and anxiety.    PT Comments    Pt admitted with above diagnosis. Pt was able to ambulate with min guard assist a short distance in room with RW.  Pt took incr time to get OOB intiially as pt with high anxiety and needs incr rest breaks to participate. Pt took O2 off about a dozen times even though she had 3/4 DOE, she would remove the O2 stating, "It is making me worse."  Then pt would replace the O2.  At the end of the treatment, pt stated that the entire reason that she did so poorly with her mobility was that she" got up on the wrong side of the bed".  Pt with very bizarre behavior throughout and is self limiting due to her anxiety.  Will continue to follow and progress pt as able. Decr frequency to 3 x week as pt is over 8 days post THA surgery. Pt actually moves well but just has to get in the right frame of mind to do so. Pt currently with functional limitations due to balance and endurance deficits. Pt will benefit from skilled PT to increase their independence and safety with mobility to allow discharge to the venue listed below.    Follow Up Recommendations  Home health PT;Supervision - Intermittent (continue HHPT)     Equipment Recommendations  None recommended by PT    Recommendations for Other Services       Precautions / Restrictions Precautions Precautions: Fall Restrictions Other Position/Activity Restrictions: WBAT    Mobility  Bed Mobility Overal bed mobility: Needs Assistance Bed Mobility: Supine to Sit     Supine to sit: Supervision;HOB elevated     General bed mobility comments: Increased  time/effort and use of rail, no assist needed.    Transfers Overall transfer level: Needs assistance Equipment used: Rolling walker (2 wheeled) Transfers: Sit to/from Stand Sit to Stand: Min guard;Mod assist Stand pivot transfers: Min assist       General transfer comment: Initially, pt so anxious she could not get up from bed.  Pt finally did stand and get to 3N1.  then stood to be cleaned with total assist for cleaing. min guard and cues for safety with standing and transfers, with reliance on RW for safety  Ambulation/Gait Ambulation/Gait assistance: Min guard Gait Distance (Feet): 20 Feet Assistive device: Rolling walker (2 wheeled) Gait Pattern/deviations: Step-through pattern;Decreased stride length;Trunk flexed Gait velocity: decreased Gait velocity interpretation: <1.8 ft/sec, indicate of risk for recurrent falls General Gait Details: Slow, mildly unsteady gait with 3/4 DOE. Not able to get 02 reading. Increased time with a few standing rest breaks. Slight circumduction noted to advance RLE. Once pt back to bed, pt reports urinating and needing to clean again.  Pt stood easily this time to be cleaned. Pt is self limiting based on her anxiety.  Pt pulled off her O2 when she would get winded multiple times during session saying that it makes her worse.  She took the O2 off at least a dozen times and then would replace when her anxiety calmed down. when PT could get a reading on pulse ox it was 80-83% when O2 removed  and it was 90% when she had O2 on at 2L.  Pt asked this PT to get her nurse to get her Xanax. Asked nursing about anxiety medication and nurse did bring pts anxiety medicine and gave it to the pt.   Stairs             Wheelchair Mobility    Modified Rankin (Stroke Patients Only)       Balance Overall balance assessment: Needs assistance Sitting-balance support: Feet supported;No upper extremity supported Sitting balance-Leahy Scale: Good     Standing  balance support: During functional activity;Bilateral upper extremity supported Standing balance-Leahy Scale: Poor Standing balance comment: Requires UE support in standing.                            Cognition Arousal/Alertness: Awake/alert Behavior During Therapy: Anxious Overall Cognitive Status: No family/caregiver present to determine baseline cognitive functioning                                 General Comments: Highly anxious about everything, Masks with laughing/humor. Exaggerated SOB noted at times with deep breaths. Reports most of her anxiety stems from worrying about her parents having to help her at home; waiting to move into a new apt.      Exercises      General Comments        Pertinent Vitals/Pain Pain Assessment: No/denies pain    Home Living                      Prior Function            PT Goals (current goals can now be found in the care plan section) Progress towards PT goals: Progressing toward goals    Frequency    Min 3X/week      PT Plan Frequency needs to be updated    Co-evaluation              AM-PAC PT "6 Clicks" Mobility   Outcome Measure  Help needed turning from your back to your side while in a flat bed without using bedrails?: None Help needed moving from lying on your back to sitting on the side of a flat bed without using bedrails?: None Help needed moving to and from a bed to a chair (including a wheelchair)?: A Little Help needed standing up from a chair using your arms (e.g., wheelchair or bedside chair)?: A Little Help needed to walk in hospital room?: A Little Help needed climbing 3-5 steps with a railing? : A Lot 6 Click Score: 19    End of Session Equipment Utilized During Treatment: Gait belt;Oxygen Activity Tolerance: Other (comment);Patient limited by fatigue (dyspnea, anxiety) Patient left: with call bell/phone within reach;in bed;with bed alarm set Nurse Communication:  Mobility status PT Visit Diagnosis: Other abnormalities of gait and mobility (R26.89);Difficulty in walking, not elsewhere classified (R26.2)     Time: 1027-2536 PT Time Calculation (min) (ACUTE ONLY): 30 min  Charges:  $Gait Training: 23-37 mins                     Amrit Erck M,PT Acute Rehab Services [phone] (pager)   Bevelyn Buckles 01/02/2021, 1:50 PM

## 2021-01-02 NOTE — Progress Notes (Addendum)
Family Medicine Teaching Service Daily Progress Note Intern Pager: [phone]  Patient name: [name] Medical record number: [id number] Date of birth: 10-13-1960 Age: 60 y.o. Gender: female  Primary Care Provider: Arvilla Market, DO Consultants: Psychiatry Code Status: Full  Pt Overview and Major Events to Date:  12/31/20: admitted  Assessment and Plan: [name] is a 60yo female presenting with acute hypoxic respiratory failure 2/2 CAP. PMH significant for AVN s/p recent total hip arthroplasty, alcohol abuse, cirrhosis, COPD, nicotine use.   Acute hypoxic respiratory failure 2/2 CAP with risk factors for multidrug resistance  COPD  Minimally increased work of breathing on exam with diffuse end expiratory wheezes. SpO2 90-93% on 2-3L [place]. Remains afebrile but WBC slightly increased at 14.7, likely reactive as patient received steroids on 5/30. MRSA swab negative. Blood cultures NGTD (2 days). Echo was overall unremarkable. - Doxycycline 100mg  BID (5/31- 6/4) - Ceftriaxone 2g daily (5/31- ), plan to transition to PO Cefdinir tomorrow - Supplemental O2, wean as tolerated - Goal SpO2 88-92% - f/u blood cx - Albuterol 2puffs q4prn  - Dulera 2 puffs BID  - Add Incruze - Incentive spirometry  - PT/OT (recommended home health PT/OT)  Hypokalemia K+ 3.2 this morning after 110 mEq K yesterday (01-27-1979 IV, PO). - Replete with PO potassium x3 today - Encourage PO intake - BMP and mag tomorrow am  T2DM Blood glucose 189 on am labs. Required 11u SAI over past 24h. A1c 6.5% on 12/25/20.  - mSSI - CBG checks before meals and at night. Consider decreasing frequency of CBGs starting tomorrow  - Atorvastatin 40mg  daily  HTN  Tachycardia BP 141/59 this am. Persistently tachycardic in the 100s-110s, likely in the setting of current infection, deconditioning, pain and anxiety. CTA negative for PE.  -Continue Amlodipine 10mg  daily -Start Losartan 25mg  daily  Anxiety  and Depression  Reports her mood is improved from prior.  - Psychiatry consulted, appreciate recommendations - Zoloft 150mg  daily - Seroquel 300mg  qhs  - Continue Hydroxyzine 50mg  TID prn  Normocytic Anemia  Thrombocytosis Hgb stable at 7.8. Ferritin 61. Likely multifactorial including blood loss from recent surgery, iron deficiency from poor nutrition. Transfusion threshold 7. Platelets elevated at 519.  - Daily CBC - Check iron panel tomorrow  GERD Chronic, stable. 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  - Tylenol 650mg  q 6prn  - Oxycodone 2.5mg  q 6 prn  - PT/OT recommending home health PT/OT  Hx of liver cirrhosis  Hx alcohol abuse AST/ALT wnl. Denies current alcohol use, stating last drank alcohol about 3 years ago  Nicotine dependence Reports smoking .5ppd   - counsel on nicotine cessation   Protein calorie malnutrition Albumin 2.4, total protein 5.9, likely in the setting of low dietary intake  - consider ensure shakes for supplementation and MVI   FEN/GI:carb modified diet  PPx: Lovenox   Disposition: pending improved respiratory status  Subjective:  No acute events overnight. This morning patient feels well other than slight shortness of breath particularly with movement. States she slept great last night and that her mood is improved from prior. Has not had much of an appetite. She mentions she is hoping to go to an assisted living facility after discharge as most of her anxiety comes from living with her parents.  Objective: Temp:  [98.1 F (36.7 C)-98.7 F (37.1 C)] 98.5 F (36.9 C) (06/01 0811) Pulse Rate:  [84-112]  100 (06/01 3546) Resp:  [19-20] 20 (06/01 0811) BP: (141-185)/(57-70) 141/59 (06/01 0811) SpO2:  [87 %-95 %] 92 % (06/01 0811) Weight:  [106.6 kg] 106.6 kg (06/01 0403) Physical Exam: General: alert, NAD Cardiovascular: Tachycardic. Regular rhythm with normal  S1/S2 Respiratory: Speaking in nearly full sentences with very mild tachypnea in the low 20s. Diffuse end expiratory wheezes. No crackles, breath sound equal bilaterally. Satting 93% on 3L.  Abdomen: soft, non distended, non tender  Extremities: warm, dry. 1+ pitting edema bilaterally up to calf. Psych: appropriate mood and affect, somewhat tangential speech  Laboratory: Recent Labs  Lab 12/31/20 1913 01/01/21 0157 01/02/21 0358  WBC 12.5* 9.3 14.7*  HGB 8.6* 7.7* 7.8*  HCT 26.3* 23.3* 24.5*  PLT 479* 403* 519*   Recent Labs  Lab 12/31/20 1008 12/31/20 1056 01/01/21 0157 01/01/21 1302 01/02/21 0358  NA 139   < > 134* 134* 137  K 2.9*   < > 3.1* 3.6 3.2*  CL 98   < > 95* 96* 97*  CO2 29   < > 29 24 26   BUN 7   < > 5* 7 6  CREATININE 0.74   < > 0.67 0.71 0.78  CALCIUM 8.3*   < > 8.1* 8.3* 8.7*  PROT 6.1*  --  5.9*  --   --   BILITOT 1.2  --  0.9  --   --   ALKPHOS 89  --  95  --   --   ALT 19  --  18  --   --   AST 19  --  18  --   --   GLUCOSE 191*   < > 298* 302* 189*   < > = values in this interval not displayed.     Imaging/Diagnostic Tests: ECHOCARDIOGRAM COMPLETE Result Date: 01/01/2021 IMPRESSIONS  1. Left ventricular ejection fraction, by estimation, is 65 to 70%. The left ventricle has normal function. The left ventricle has no regional wall motion abnormalities. There is mild left ventricular hypertrophy. Left ventricular diastolic parameters are indeterminate.  2. Right ventricular systolic function is normal. The right ventricular size is normal. Tricuspid regurgitation signal is inadequate for assessing PA pressure.  3. Left atrial size was mildly dilated.  4. The mitral valve is normal in structure. No evidence of mitral valve regurgitation. No evidence of mitral stenosis. Increased gradient (01/03/2021) through mitral valve likely due to high output state, normal MVA by continuity equation (2.5 cm^2)  5. The aortic valve was not well visualized. Aortic valve  regurgitation is not visualized. No aortic stenosis is present.   , MD 01/02/2021, 8:50 AM PGY-1, [facility] Family Medicine FPTS Intern pager: [phone], text pages welcome

## 2021-01-02 NOTE — Consult Note (Signed)
[facility] Face-to-Face Psychiatry Consult   Reason for Consult:  Concern for psychosis Referring Physician:  Terisa Starr, MD Patient Identification: Samuella Simon MRN:  [id number] Principal Diagnosis: <principal problem not specified> Diagnosis:  Active Problems:   Pneumonia   Hypoxia   Hypokalemia   Wheezing   Total Time spent with patient: 20 minutes  Subjective:   [name] is a 60 y.o. female patient admitted with dyspnea. PMH is significant forAVN s/p R total hip arthroplasty, alcohol abuse, cirrhosis, COPD, nicotine dependence.She is reporting increased anxiety and decreased sleep.Marland Kitchen  HPI:  ON assessment this AM patient reports she would like to take a nap. Provider informs patient that it is still early in the morning and inquires if patient did not sleep well. Patient reports that she slept very well last night with the adjustments to her medication; however her family has often told her to "rest" or take a nap when she is bored. Patient is very upbeat today. Patient allowed provider to open thr blinds during the exam and patient reported that her mood has "improved" from when she first presented. Patient also reports that her appetite has drastically improved since being in the hospital. Patient denies SI, HI, and AVH. Patient confirms that she has an OP Psychiatrist and reports that she will see her once she is discharged. Patient also endorses that she will be compliant with her medications once she is discharged.  Past Psychiatric History: MDD , Anxiety  Risk to Self:  NO Risk to Others:   NO Prior Inpatient Therapy:   NO Prior Outpatient Therapy:   Yes Dr. Evelene Croon  Past Medical History:  Past Medical History:  Diagnosis Date  . Anxiety   . Arthritis   . COPD (chronic obstructive pulmonary disease) (HCC)   . Depression   . Dyspnea   . GERD (gastroesophageal reflux disease)   . Hypertension   . Peripheral edema    2+ at PAT visit    Past Surgical History:  Procedure  Laterality Date  . ABDOMINAL HYSTERECTOMY  1986  . CARPAL TUNNEL RELEASE Bilateral 1980   Per patient  . SHOULDER SURGERY Left 1980  . 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   Procedure: RIGHT TOTAL HIP ARTHROPLASTY ANTERIOR APPROACH;  Surgeon: Gean Birchwood, MD;  Location: [facility];  Service: Orthopedics;  Laterality: Right;   Family History:  Family History  Problem Relation Age of Onset  . Ulcers Father   . Hypotension Sister   . Hypertension Brother    Family Psychiatric  History: Reports several psychiatric issues in her mothers family Social History:  Social History   Substance and Sexual Activity  Alcohol Use Not Currently     Social History   Substance and Sexual Activity  Drug Use Never    Social History   Socioeconomic History  . Marital status: Single    Spouse name: Not on file  . Number of children: 0  . Years of education: 33  . Highest education level: GED or equivalent  Occupational History  . Occupation: Armed forces operational officer  Tobacco Use  . Smoking status: Current Every Day Smoker    Packs/day: 1.00    Years: 25.00    Pack years: 25.00    Types: Cigarettes    Start date: 08/04/1978  . Smokeless tobacco: Never Used  Vaping Use  . Vaping Use: Never used  Substance and Sexual Activity  . Alcohol use: Not Currently  . Drug use: Never  . Sexual activity: Never  Other Topics  Concern  . Not on file  Social History Narrative   Lives and step-dad, they are able to help.   Social Determinants of Health   Financial Resource Strain: Not on file  Food Insecurity: Not on file  Transportation Needs: Not on file  Physical Activity: Not on file  Stress: Not on file  Social Connections: Not on file   Additional Social History:    Allergies:   Allergies  Allergen Reactions  . Tizanidine Other (See Comments)    May 2022- Reported hallucinations vs dreams w/ sleep walking at home when tizanidine added post-op   . Amoxicillin     Yeast infection      Labs:  Results for orders placed or performed during the hospital encounter of 12/31/20 (from the past 48 hour(s))  I-Stat arterial blood gas, ED     Status: Abnormal   Collection Time: 12/31/20 10:56 AM  Result Value Ref Range   pH, Arterial 7.678 (HH) 7.350 - 7.450   pCO2 arterial 26.7 (L) 32.0 - 48.0 mmHg   pO2, Arterial 127 (H) 83.0 - 108.0 mmHg   Bicarbonate 31.3 (H) 20.0 - 28.0 mmol/L   TCO2 32 22 - 32 mmol/L   O2 Saturation 100.0 %   Acid-Base Excess 10.0 (H) 0.0 - 2.0 mmol/L   Sodium 132 (L) 135 - 145 mmol/L   Potassium 2.1 (LL) 3.5 - 5.1 mmol/L   Calcium, Ion 0.96 (L) 1.15 - 1.40 mmol/L   HCT 23.0 (L) 36.0 - 46.0 %   Hemoglobin 7.8 (L) 12.0 - 15.0 g/dL   Patient temperature 16.1 F    Collection site Radial    Drawn by RT    Sample type ARTERIAL    Comment NOTIFIED PHYSICIAN   Magnesium     Status: Abnormal   Collection Time: 12/31/20 12:42 PM  Result Value Ref Range   Magnesium 1.6 (L) 1.7 - 2.4 mg/dL    Comment: Performed at [facility] Lab, [address] [place], [place] [address]  Salicylate level     Status: Abnormal   Collection Time: 12/31/20 12:42 PM  Result Value Ref Range   Salicylate Lvl <7.0 (L) 7.0 - 30.0 mg/dL    Comment: Performed at [facility], [address], [place], [place] [address]  Brain natriuretic peptide     Status: Abnormal   Collection Time: 12/31/20  2:03 PM  Result Value Ref Range   B Natriuretic Peptide 358.3 (H) 0.0 - 100.0 pg/mL    Comment: Performed at [facility] Lab, [address] [place], [place] [address]  Resp Panel by RT-PCR (Flu A&B, Covid) Nasopharyngeal Swab     Status: None   Collection Time: 12/31/20  2:11 PM   Specimen: Nasopharyngeal Swab; Nasopharyngeal(NP) swabs in vial transport medium  Result Value Ref Range   SARS Coronavirus 2 by RT PCR NEGATIVE NEGATIVE    Comment: (NOTE) SARS-CoV-2 target nucleic acids are NOT DETECTED.  The SARS-CoV-2 RNA is generally detectable in upper  respiratory specimens during the acute phase of infection. The lowest concentration of SARS-CoV-2 viral copies this assay can detect is 138 copies/mL. A negative result does not preclude SARS-Cov-2 infection and should not be used as the sole basis for treatment or other patient management decisions. A negative result may occur with  improper specimen collection/handling, submission of specimen other than nasopharyngeal swab, presence of viral mutation(s) within the areas targeted by this assay, and inadequate number of viral copies(<138 copies/mL). A negative result  must be combined with clinical observations, patient history, and epidemiological information. The expected result is Negative.  Fact Sheet for Patients:  BloggerCourse.com  Fact Sheet for Healthcare Providers:  SeriousBroker.it  This test is no t yet approved or cleared by the [place] FDA and  has been authorized for detection and/or diagnosis of SARS-CoV-2 by FDA under an Emergency Use Authorization (EUA). This EUA will remain  in effect (meaning this test can be used) for the duration of the COVID-19 declaration under Section 564(b)(1) of the Act, 21 U.S.C.section 360bbb-3(b)(1), unless the authorization is terminated  or revoked sooner.       Influenza A by PCR NEGATIVE NEGATIVE   Influenza B by PCR NEGATIVE NEGATIVE    Comment: (NOTE) The Xpert Xpress SARS-CoV-2/FLU/RSV plus assay is intended as an aid in the diagnosis of influenza from Nasopharyngeal swab specimens and should not be used as a sole basis for treatment. Nasal washings and aspirates are unacceptable for Xpert Xpress SARS-CoV-2/FLU/RSV testing.  Fact Sheet for Patients: BloggerCourse.com  Fact Sheet for Healthcare Providers: SeriousBroker.it  This test is not yet approved or cleared by the [place] FDA and has been authorized for  detection and/or diagnosis of SARS-CoV-2 by FDA under an Emergency Use Authorization (EUA). This EUA will remain in effect (meaning this test can be used) for the duration of the COVID-19 declaration under Section 564(b)(1) of the Act, 21 U.S.C. section 360bbb-3(b)(1), unless the authorization is terminated or revoked.  Performed at [facility], [address] [place], [place] [address]   Ethanol     Status: None   Collection Time: 12/31/20  7:13 PM  Result Value Ref Range   Alcohol, Ethyl (B) <10 <10 mg/dL    Comment: (NOTE) Lowest detectable limit for serum alcohol is 10 mg/dL.  For medical purposes only. Performed at [facility], [address], [place], [place] [address]   HIV Antibody (routine testing w rflx)     Status: None   Collection Time: 12/31/20  7:13 PM  Result Value Ref Range   HIV Screen 4th Generation wRfx Non Reactive Non Reactive    Comment: Performed at [facility], [address], [place], [place] [address]  CBC     Status: Abnormal   Collection Time: 12/31/20  7:13 PM  Result Value Ref Range   WBC 12.5 (H) 4.0 - 10.5 K/uL   RBC 2.71 (L) 3.87 - 5.11 MIL/uL   Hemoglobin 8.6 (L) 12.0 - 15.0 g/dL   HCT 91.4 (L) 78.2 - 95.6 %   MCV 97.0 80.0 - 100.0 fL   MCH 31.7 26.0 - 34.0 pg   MCHC 32.7 30.0 - 36.0 g/dL   RDW 21.3 08.6 - 57.8 %   Platelets 479 (H) 150 - 400 K/uL   nRBC 0.5 (H) 0.0 - 0.2 %    Comment: Performed at [facility], [address] [place], [place] [address]  Basic metabolic panel     Status: Abnormal   Collection Time: 12/31/20  7:13 PM  Result Value Ref Range   Sodium 135 135 - 145 mmol/L   Potassium 2.7 (LL) 3.5 - 5.1 mmol/L    Comment: CRITICAL RESULT CALLED TO, READ BACK BY AND VERIFIED WITH: Mathis Fare, RN. 2054 12/3020 A. MCDOWELL    Chloride 93 (L) 98 - 111 mmol/L   CO2 28 22 - 32 mmol/L   Glucose, Bld 242 (H) 70 - 99 mg/dL    Comment:  Glucose reference range applies only to samples taken after fasting  for at least 8 hours.   BUN 5 (L) 6 - 20 mg/dL   Creatinine, Ser 3.22 0.44 - 1.00 mg/dL   Calcium 8.2 (L) 8.9 - 10.3 mg/dL   GFR, Estimated >02 >54 mL/min    Comment: (NOTE) Calculated using the CKD-EPI Creatinine Equation (2021)    Anion gap 14 5 - 15    Comment: Performed at [facility] Lab, [address] [place], [place] [address]  Lactic acid, plasma     Status: Abnormal   Collection Time: 12/31/20  7:13 PM  Result Value Ref Range   Lactic Acid, Venous 2.0 (HH) 0.5 - 1.9 mmol/L    Comment: CRITICAL RESULT CALLED TO, READ BACK BY AND VERIFIED WITH: A OPOKU RN [id number] K FORSYTH Performed at [facility] Lab, [address] [place], [place] [address]   D-dimer, quantitative     Status: Abnormal   Collection Time: 12/31/20  7:13 PM  Result Value Ref Range   D-Dimer, Quant 2.91 (H) 0.00 - 0.50 ug/mL-FEU    Comment: (NOTE) At the manufacturer cut-off value of 0.5 g/mL FEU, this assay has a negative predictive value of 95-100%.This assay is intended for use in conjunction with a clinical pretest probability (PTP) assessment model to exclude pulmonary embolism (PE) and deep venous thrombosis (DVT) in outpatients suspected of PE or DVT. Results should be correlated with clinical presentation. Performed at [facility] Lab, [address], [place], [place] [address]   Procalcitonin - Baseline     Status: None   Collection Time: 12/31/20  7:13 PM  Result Value Ref Range   Procalcitonin 0.15 ng/mL    Comment:        Interpretation: PCT (Procalcitonin) <= 0.5 ng/mL: Systemic infection (sepsis) is not likely. Local bacterial infection is possible. (NOTE)       Sepsis PCT Algorithm           Lower Respiratory Tract                                      Infection PCT Algorithm    ----------------------------     ----------------------------         PCT < 0.25 ng/mL                PCT < 0.10 ng/mL          Strongly encourage             Strongly discourage    discontinuation of antibiotics    initiation of antibiotics    ----------------------------     -----------------------------       PCT 0.25 - 0.50 ng/mL            PCT 0.10 - 0.25 ng/mL               OR       >80% decrease in PCT            Discourage initiation of                                            antibiotics      Encourage discontinuation           of antibiotics    ----------------------------     -----------------------------  PCT >= 0.50 ng/mL              PCT 0.26 - 0.50 ng/mL               AND        <80% decrease in PCT             Encourage initiation of                                             antibiotics       Encourage continuation           of antibiotics    ----------------------------     -----------------------------        PCT >= 0.50 ng/mL                  PCT > 0.50 ng/mL               AND         increase in PCT                  Strongly encourage                                      initiation of antibiotics    Strongly encourage escalation           of antibiotics                                     -----------------------------                                           PCT <= 0.25 ng/mL                                                 OR                                        > 80% decrease in PCT                                      Discontinue / Do not initiate                                             antibiotics  Performed at [facility], [address] [place], [place] [id number]   MRSA PCR Screening     Status: None   Collection Time: 12/31/20  8:50 PM   Specimen: Nasopharyngeal  Result Value Ref Range   MRSA by PCR NEGATIVE NEGATIVE    Comment:        The GeneXpert MRSA Assay (FDA  approved for NASAL specimens only), is one component of a comprehensive MRSA colonization surveillance program. It is not intended to diagnose MRSA infection nor to guide or monitor treatment for MRSA infections. Performed at [facility] Lab, [address], [place], [place] [address]   Culture, blood (routine x 2)     Status: None (Preliminary result)   Collection Time: 12/31/20  9:20 PM   Specimen: BLOOD LEFT ARM  Result Value Ref Range   Specimen Description BLOOD LEFT ARM    Special Requests      BOTTLES DRAWN AEROBIC AND ANAEROBIC Blood Culture adequate volume   Culture      NO GROWTH 2 DAYS Performed at [facility], [address], [place], [place] [address]    Report Status PENDING   Culture, blood (routine x 2)     Status: None (Preliminary result)   Collection Time: 12/31/20  9:20 PM   Specimen: BLOOD RIGHT HAND  Result Value Ref Range   Specimen Description BLOOD RIGHT HAND    Special Requests      BOTTLES DRAWN AEROBIC AND ANAEROBIC Blood Culture adequate volume   Culture      NO GROWTH 2 DAYS Performed at [facility] Lab, [address] [place], [place] [address]    Report Status PENDING   Comprehensive metabolic panel     Status: Abnormal   Collection Time: 01/01/21  1:57 AM  Result Value Ref Range   Sodium 134 (L) 135 - 145 mmol/L   Potassium 3.1 (L) 3.5 - 5.1 mmol/L   Chloride 95 (L) 98 - 111 mmol/L   CO2 29 22 - 32 mmol/L   Glucose, Bld 298 (H) 70 - 99 mg/dL    Comment: Glucose reference range applies only to samples taken after fasting for at least 8 hours.   BUN 5 (L) 6 - 20 mg/dL   Creatinine, Ser 4.01 0.44 - 1.00 mg/dL   Calcium 8.1 (L) 8.9 - 10.3 mg/dL   Total Protein 5.9 (L) 6.5 - 8.1 g/dL   Albumin 2.4 (L) 3.5 - 5.0 g/dL   AST 18 15 - 41 U/L   ALT 18 0 - 44 U/L   Alkaline Phosphatase 95 38 - 126 U/L   Total Bilirubin 0.9 0.3 - 1.2 mg/dL   GFR, Estimated >02 >72 mL/min    Comment: (NOTE) Calculated using the CKD-EPI Creatinine Equation (2021)    Anion gap 10 5 - 15    Comment: Performed at [facility] Lab, [address] [place], [place] [address]  Procalcitonin     Status: None   Collection Time: 01/01/21  1:57 AM  Result Value Ref Range    Procalcitonin 0.11 ng/mL    Comment:        Interpretation: PCT (Procalcitonin) <= 0.5 ng/mL: Systemic infection (sepsis) is not likely. Local bacterial infection is possible. (NOTE)       Sepsis PCT Algorithm           Lower Respiratory Tract                                      Infection PCT Algorithm    ----------------------------     ----------------------------         PCT < 0.25 ng/mL                PCT < 0.10 ng/mL  Strongly encourage             Strongly discourage   discontinuation of antibiotics    initiation of antibiotics    ----------------------------     -----------------------------       PCT 0.25 - 0.50 ng/mL            PCT 0.10 - 0.25 ng/mL               OR       >80% decrease in PCT            Discourage initiation of                                            antibiotics      Encourage discontinuation           of antibiotics    ----------------------------     -----------------------------         PCT >= 0.50 ng/mL              PCT 0.26 - 0.50 ng/mL               AND        <80% decrease in PCT             Encourage initiation of                                             antibiotics       Encourage continuation           of antibiotics    ----------------------------     -----------------------------        PCT >= 0.50 ng/mL                  PCT > 0.50 ng/mL               AND         increase in PCT                  Strongly encourage                                      initiation of antibiotics    Strongly encourage escalation           of antibiotics                                     -----------------------------                                           PCT <= 0.25 ng/mL                                                 OR                                        >   80% decrease in PCT                                      Discontinue / Do not initiate                                             antibiotics  Performed at [facility]  Lab, [address], [place], [place] [address]   CBC with Differential/Platelet     Status: Abnormal   Collection Time: 01/01/21  1:57 AM  Result Value Ref Range   WBC 9.3 4.0 - 10.5 K/uL   RBC 2.43 (L) 3.87 - 5.11 MIL/uL   Hemoglobin 7.7 (L) 12.0 - 15.0 g/dL   HCT 64.4 (L) 03.4 - 74.2 %   MCV 95.9 80.0 - 100.0 fL   MCH 31.7 26.0 - 34.0 pg   MCHC 33.0 30.0 - 36.0 g/dL   RDW 59.5 63.8 - 75.6 %   Platelets 403 (H) 150 - 400 K/uL   nRBC 0.6 (H) 0.0 - 0.2 %   Neutrophils Relative % 91 %   Neutro Abs 8.5 (H) 1.7 - 7.7 K/uL   Lymphocytes Relative 5 %   Lymphs Abs 0.4 (L) 0.7 - 4.0 K/uL   Monocytes Relative 3 %   Monocytes Absolute 0.3 0.1 - 1.0 K/uL   Eosinophils Relative 0 %   Eosinophils Absolute 0.0 0.0 - 0.5 K/uL   Basophils Relative 0 %   Basophils Absolute 0.0 0.0 - 0.1 K/uL   Immature Granulocytes 1 %   Abs Immature Granulocytes 0.09 (H) 0.00 - 0.07 K/uL    Comment: Performed at [facility], [address], [place], [place] [address]  Magnesium     Status: None   Collection Time: 01/01/21  1:57 AM  Result Value Ref Range   Magnesium 2.0 1.7 - 2.4 mg/dL    Comment: Performed at [facility], [address], [place], [place] [address]  Basic metabolic panel     Status: Abnormal   Collection Time: 01/01/21  1:02 PM  Result Value Ref Range   Sodium 134 (L) 135 - 145 mmol/L   Potassium 3.6 3.5 - 5.1 mmol/L   Chloride 96 (L) 98 - 111 mmol/L   CO2 24 22 - 32 mmol/L   Glucose, Bld 302 (H) 70 - 99 mg/dL    Comment: Glucose reference range applies only to samples taken after fasting for at least 8 hours.   BUN 7 6 - 20 mg/dL   Creatinine, Ser 1.66 0.44 - 1.00 mg/dL   Calcium 8.3 (L) 8.9 - 10.3 mg/dL   GFR, Estimated >06 >30 mL/min    Comment: (NOTE) Calculated using the CKD-EPI Creatinine Equation (2021)    Anion gap 14 5 - 15    Comment: Performed at [facility] Lab, [address], [place], [place] [address]  Glucose, capillary     Status: Abnormal    Collection Time: 01/01/21  4:15 PM  Result Value Ref Range   Glucose-Capillary 312 (H) 70 - 99 mg/dL    Comment: Glucose reference range applies only to samples taken after fasting for at least 8 hours.  Glucose, capillary     Status: Abnormal   Collection Time: 01/01/21  8:06 PM  Result  Value Ref Range   Glucose-Capillary 131 (H) 70 - 99 mg/dL    Comment: Glucose reference range applies only to samples taken after fasting for at least 8 hours.  Procalcitonin     Status: None   Collection Time: 01/02/21  3:58 AM  Result Value Ref Range   Procalcitonin <0.10 ng/mL    Comment:        Interpretation: PCT (Procalcitonin) <= 0.5 ng/mL: Systemic infection (sepsis) is not likely. Local bacterial infection is possible. (NOTE)       Sepsis PCT Algorithm           Lower Respiratory Tract                                      Infection PCT Algorithm    ----------------------------     ----------------------------         PCT < 0.25 ng/mL                PCT < 0.10 ng/mL          Strongly encourage             Strongly discourage   discontinuation of antibiotics    initiation of antibiotics    ----------------------------     -----------------------------       PCT 0.25 - 0.50 ng/mL            PCT 0.10 - 0.25 ng/mL               OR       >80% decrease in PCT            Discourage initiation of                                            antibiotics      Encourage discontinuation           of antibiotics    ----------------------------     -----------------------------         PCT >= 0.50 ng/mL              PCT 0.26 - 0.50 ng/mL               AND        <80% decrease in PCT             Encourage initiation of                                             antibiotics       Encourage continuation           of antibiotics    ----------------------------     -----------------------------        PCT >= 0.50 ng/mL                  PCT > 0.50 ng/mL               AND         increase in PCT                   Strongly encourage  initiation of antibiotics    Strongly encourage escalation           of antibiotics                                     -----------------------------                                           PCT <= 0.25 ng/mL                                                 OR                                        > 80% decrease in PCT                                      Discontinue / Do not initiate                                             antibiotics  Performed at [facility], [address], [place], [place] [address]   Ferritin     Status: None   Collection Time: 01/02/21  3:58 AM  Result Value Ref Range   Ferritin 61 11 - 307 ng/mL    Comment: Performed at [facility], [address], [place], [place] [address]  Basic metabolic panel     Status: Abnormal   Collection Time: 01/02/21  3:58 AM  Result Value Ref Range   Sodium 137 135 - 145 mmol/L   Potassium 3.2 (L) 3.5 - 5.1 mmol/L   Chloride 97 (L) 98 - 111 mmol/L   CO2 26 22 - 32 mmol/L   Glucose, Bld 189 (H) 70 - 99 mg/dL    Comment: Glucose reference range applies only to samples taken after fasting for at least 8 hours.   BUN 6 6 - 20 mg/dL   Creatinine, Ser 0.98 0.44 - 1.00 mg/dL   Calcium 8.7 (L) 8.9 - 10.3 mg/dL   GFR, Estimated >11 >91 mL/min    Comment: (NOTE) Calculated using the CKD-EPI Creatinine Equation (2021)    Anion gap 14 5 - 15    Comment: Performed at [facility], [address], [place], [place] [address]  CBC     Status: Abnormal   Collection Time: 01/02/21  3:58 AM  Result Value Ref Range   WBC 14.7 (H) 4.0 - 10.5 K/uL   RBC 2.48 (L) 3.87 - 5.11 MIL/uL   Hemoglobin 7.8 (L) 12.0 - 15.0 g/dL   HCT 56.2 (L) 13.0 - 86.5 %   MCV 98.8 80.0 - 100.0 fL   MCH 31.5 26.0 - 34.0 pg   MCHC 31.8 30.0 - 36.0 g/dL   RDW 78.4 69.6 - 29.5 %   Platelets 519 (H) 150 - 400 K/uL  nRBC 0.8 (H) 0.0 - 0.2 %    Comment: Performed at [facility] Lab, [address], [place], [place] [address]  Glucose, capillary     Status: Abnormal   Collection Time: 01/02/21  8:18 AM  Result Value Ref Range   Glucose-Capillary 197 (H) 70 - 99 mg/dL    Comment: Glucose reference range applies only to samples taken after fasting for at least 8 hours.    Current Facility-Administered Medications  Medication Dose Route Frequency Provider Last Rate Last Admin  . acetaminophen (TYLENOL) tablet 650 mg  650 mg Oral Q6H PRN Cora Collum, DO   650 mg at 01/01/21 0820   Or  . acetaminophen (TYLENOL) suppository 650 mg  650 mg Rectal Q6H PRN Cora Collum, DO      . albuterol (VENTOLIN HFA) 108 (90 Base) MCG/ACT inhaler 2 puff  2 puff Inhalation Q4H PRN Franchot Erichsen J, DO   2 puff at 01/01/21 1956  . amLODipine (NORVASC) tablet 10 mg  10 mg Oral Daily Dana Allan, MD   10 mg at 01/02/21 0811  . atorvastatin (LIPITOR) tablet 40 mg  40 mg Oral Daily Dana Allan, MD   40 mg at 01/02/21 0811  . cefTRIAXone (ROCEPHIN) 2 g in sodium chloride 0.9 % 100 mL IVPB  2 g Intravenous Q24H Westley Chandler, MD 200 mL/hr at 01/02/21 0808 2 g at 01/02/21 0808  . doxycycline (VIBRA-TABS) tablet 100 mg  100 mg Oral Q12H Dana Allan, MD   100 mg at 01/02/21 0811  . enoxaparin (LOVENOX) injection 40 mg  40 mg Subcutaneous Q24H Paige, Victoria J, DO   40 mg at 01/01/21 1718  . hydrOXYzine (ATARAX/VISTARIL) tablet 50 mg  50 mg Oral TID PRN Jovita Kussmaul, MD   50 mg at 01/02/21 0401  . insulin aspart (novoLOG) injection 0-15 Units  0-15 Units Subcutaneous TID WC Cora Collum, DO   3 Units at 01/02/21 6045  . mometasone-formoterol (DULERA) 200-5 MCG/ACT inhaler 2 puff  2 puff Inhalation BID Cora Collum, DO   2 puff at 01/02/21 0736  . ondansetron (ZOFRAN) tablet 4 mg  4 mg Oral Q12H PRN Cora Collum, DO   4 mg at 01/02/21 0811  . oxyCODONE (Oxy IR/ROXICODONE) immediate release tablet 2.5 mg  2.5 mg Oral Q6H PRN Dana Allan, MD   2.5 mg at  01/02/21 0401  . pantoprazole (PROTONIX) EC tablet 40 mg  40 mg Oral Daily Dana Allan, MD   40 mg at 01/02/21 0811  . potassium chloride SA (KLOR-CON) CR tablet 40 mEq  40 mEq Oral Q4H Maury Dus, MD   40 mEq at 01/02/21 4098   Or  . potassium chloride (KLOR-CON) packet 40 mEq  40 mEq Oral Q4H Maury Dus, MD      . QUEtiapine (SEROQUEL) tablet 300 mg  300 mg Oral QHS Lauro Franklin, MD   300 mg at 01/01/21 2039  . sertraline (ZOLOFT) tablet 150 mg  150 mg Oral Daily Lauro Franklin, MD   150 mg at 01/02/21 1191    Musculoskeletal: Strength & Muscle Tone: within normal limits Gait & Station: defer patient remains in bed Patient leans: N/A            Psychiatric Specialty Exam:  Presentation  General Appearance: Appropriate for Environment  Eye Contact:Good  Speech:Clear and Coherent  Speech Volume:Normal  Handedness:No data recorded  Mood and Affect  Mood:Euthymic  Affect:Congruent   Thought  Process  Thought Processes:Coherent  Descriptions of Associations:Intact  Orientation:Full (Time, Place and Person)  Thought Content:Logical  History of Schizophrenia/Schizoaffective disorder:No data recorded Duration of Psychotic Symptoms:No data recorded Hallucinations:Hallucinations: None  Ideas of Reference:None  Suicidal Thoughts:Suicidal Thoughts: No  Homicidal Thoughts:Homicidal Thoughts: No   Sensorium  Memory:Immediate Good; Recent Good; Remote Good  Judgment:Fair  Insight:Fair   Executive Functions  Concentration:Good  Attention Span:Good  Recall:Good  Fund of Knowledge:Good  Language:Good   Psychomotor Activity  Psychomotor Activity:Psychomotor Activity: Normal   Assets  Assets:Resilience; Manufacturing systems engineer; Desire for Improvement   Sleep  Sleep:Sleep: Fair ("improved significantly")   Physical Exam: Physical Exam Constitutional:      Appearance: Normal appearance.  HENT:     Head:  Normocephalic and atraumatic.  Eyes:     Extraocular Movements: Extraocular movements intact.     Conjunctiva/sclera: Conjunctivae normal.  Cardiovascular:     Rate and Rhythm: Normal rate.  Pulmonary:     Effort: Pulmonary effort is normal.     Breath sounds: Normal breath sounds. No wheezing.  Abdominal:     General: Abdomen is flat.  Musculoskeletal:        General: Normal range of motion.  Skin:    General: Skin is warm and dry.  Neurological:     General: No focal deficit present.     Mental Status: She is alert.  Psychiatric:        Thought Content: Thought content normal.    Review of Systems  Constitutional: Negative for chills and fever.  HENT: Negative for hearing loss.   Eyes: Negative for blurred vision.  Respiratory: Negative for wheezing.   Cardiovascular: Negative for chest pain.  Gastrointestinal: Negative for abdominal pain.  Musculoskeletal: Positive for back pain and joint pain.  Neurological: Negative for dizziness.  Psychiatric/Behavioral: Negative for suicidal ideas. The patient does not have insomnia.    Blood pressure (!) 141/59, pulse 100, temperature 98.5 F (36.9 C), temperature source Oral, resp. rate 20, weight 106.6 kg, SpO2 90 %. Body mass index is 42.98 kg/m.  Treatment Plan Summary: Medication management  Disposition: Patient does not meet criteria for psychiatric inpatient admission. At this time patient is determined to be psychiatrically stable. Recommend that patient continued her current regimen and follow-up with her OP Psychiatrist.   Continue - Zoloft 150 mg - Seroquel 300mg  QHS  Psychiatry will sign off. We appreciate the consult.   PGY-1 Bobbye Morton, MD 01/02/2021 10:34 AM

## 2021-01-02 NOTE — Plan of Care (Signed)
Medicated for anxiety. Safety and fall precautions maintained.

## 2021-01-03 ENCOUNTER — Other Ambulatory Visit (HOSPITAL_COMMUNITY): Payer: Self-pay

## 2021-01-03 DIAGNOSIS — M25559 Pain in unspecified hip: Secondary | ICD-10-CM | POA: Diagnosis not present

## 2021-01-03 DIAGNOSIS — R0902 Hypoxemia: Secondary | ICD-10-CM | POA: Diagnosis not present

## 2021-01-03 DIAGNOSIS — J189 Pneumonia, unspecified organism: Secondary | ICD-10-CM | POA: Diagnosis not present

## 2021-01-03 DIAGNOSIS — G9341 Metabolic encephalopathy: Secondary | ICD-10-CM | POA: Diagnosis not present

## 2021-01-03 LAB — CBC
HCT: 23.9 % — ABNORMAL LOW (ref 36.0–46.0)
Hemoglobin: 7.7 g/dL — ABNORMAL LOW (ref 12.0–15.0)
MCH: 31.8 pg (ref 26.0–34.0)
MCHC: 32.2 g/dL (ref 30.0–36.0)
MCV: 98.8 fL (ref 80.0–100.0)
Platelets: 472 10*3/uL — ABNORMAL HIGH (ref 150–400)
RBC: 2.42 MIL/uL — ABNORMAL LOW (ref 3.87–5.11)
RDW: 12.4 % (ref 11.5–15.5)
WBC: 11.8 10*3/uL — ABNORMAL HIGH (ref 4.0–10.5)
nRBC: 0.8 % — ABNORMAL HIGH (ref 0.0–0.2)

## 2021-01-03 LAB — GLUCOSE, CAPILLARY
Glucose-Capillary: 144 mg/dL — ABNORMAL HIGH (ref 70–99)
Glucose-Capillary: 128 mg/dL — ABNORMAL HIGH (ref 70–99)

## 2021-01-03 LAB — BASIC METABOLIC PANEL
Anion gap: 7 (ref 5–15)
BUN: 7 mg/dL (ref 6–20)
CO2: 26 mmol/L (ref 22–32)
Calcium: 8.6 mg/dL — ABNORMAL LOW (ref 8.9–10.3)
Chloride: 103 mmol/L (ref 98–111)
Creatinine, Ser: 0.72 mg/dL (ref 0.44–1.00)
GFR, Estimated: >60 mL/min (ref >60)
Glucose, Bld: 135 mg/dL — ABNORMAL HIGH (ref 70–99)
Potassium: 4.5 mmol/L (ref 3.5–5.1)
Sodium: 136 mmol/L (ref 135–145)

## 2021-01-03 LAB — IRON AND TIBC
Iron: 15 ug/dL — ABNORMAL LOW (ref 28–170)
Saturation Ratios: 5 % — ABNORMAL LOW (ref 10.4–31.8)
TIBC: 301 ug/dL (ref 250–450)
UIBC: 286 ug/dL

## 2021-01-03 LAB — MAGNESIUM: Magnesium: 1.7 mg/dL (ref 1.7–2.4)

## 2021-01-03 MED ORDER — DOXYCYCLINE HYCLATE 100 MG PO TABS
100.0000 mg | ORAL_TABLET | Freq: Two times a day (BID) | ORAL | 0 refills | Status: AC
  Filled 2021-01-03: qty 5, 3d supply, fill #0

## 2021-01-03 MED ORDER — CEFDINIR 300 MG PO CAPS: 300.0000 mg | ORAL_CAPSULE | Freq: Two times a day (BID) | ORAL | Status: DC

## 2021-01-03 MED ORDER — POTASSIUM CHLORIDE CRYS ER 20 MEQ PO TBCR
40.0000 meq | EXTENDED_RELEASE_TABLET | Freq: Every day | ORAL | Status: DC
  Administered 2021-01-03: 40 meq via ORAL
  Filled 2021-01-03: qty 2

## 2021-01-03 MED ORDER — FERROUS GLUCONATE 324 (38 FE) MG PO TABS
324.0000 mg | ORAL_TABLET | Freq: Every day | ORAL | Status: DC
  Administered 2021-01-03: 324 mg via ORAL
  Filled 2021-01-03: qty 1

## 2021-01-03 MED ORDER — CEFDINIR 300 MG PO CAPS
300.0000 mg | ORAL_CAPSULE | Freq: Two times a day (BID) | ORAL | 0 refills | Status: AC
  Filled 2021-01-03: qty 4, 2d supply, fill #0

## 2021-01-03 MED ORDER — ASPIRIN EC 81 MG PO TBEC
81.0000 mg | DELAYED_RELEASE_TABLET | Freq: Two times a day (BID) | ORAL | Status: DC
  Administered 2021-01-03: 81 mg via ORAL
  Filled 2021-01-03: qty 1

## 2021-01-03 MED ORDER — ACETAMINOPHEN 325 MG PO TABS: 650.0000 mg | ORAL_TABLET | Freq: Four times a day (QID) | ORAL | Status: DC | PRN

## 2021-01-03 MED ORDER — LOSARTAN POTASSIUM 25 MG PO TABS
25.0000 mg | ORAL_TABLET | Freq: Every day | ORAL | 0 refills | Status: DC
  Filled 2021-01-03: qty 30, 30d supply, fill #0

## 2021-01-03 MED ORDER — QUETIAPINE FUMARATE 300 MG PO TABS
300.0000 mg | ORAL_TABLET | Freq: Every day | ORAL | 0 refills | Status: DC
  Filled 2021-01-03: qty 30, 30d supply, fill #0

## 2021-01-03 MED ORDER — SERTRALINE HCL 100 MG PO TABS
150.0000 mg | ORAL_TABLET | Freq: Every day | ORAL | 0 refills | Status: DC
  Filled 2021-01-03: qty 45, 30d supply, fill #0

## 2021-01-03 MED ORDER — FERROUS GLUCONATE 324 (38 FE) MG PO TABS
324.0000 mg | ORAL_TABLET | Freq: Every day | ORAL | 0 refills | Status: DC
  Filled 2021-01-03: qty 30, 30d supply, fill #0

## 2021-01-03 NOTE — Progress Notes (Addendum)
FPTS Interim Progress Note  Patient sleeping and resting comfortably with oxygen removed.  Rounded with primary RN and asked to repeat vital signs with oxygen saturations. Had been documented 5 L and satting 90%.  Patient snoring and noted to have apneic periods.   No concerns voiced.  No orders required.  Appreciated nightly round.    Today's Vitals   01/02/21 2000 01/02/21 2252 01/03/21 0024 01/03/21 0028  BP:   136/72   Pulse:   100   Resp:   20   Temp:   98.5 F (36.9 C)   TempSrc:   Oral   SpO2:   (!) 80% 93%  Weight:      PainSc: 0-No pain 2      May benefit from BiPAP nightly.  Dana Allan, MD Family Medicine Residency

## 2021-01-03 NOTE — Progress Notes (Signed)
Patient resting on 2L nasal cannula with sats 94%, removed oxygen and sats came down to 92% on room air at rest. With ambulation on room air sats dropped to 84%, ambulating with 2 L nasal cannula her sats improved to 92%.

## 2021-01-03 NOTE — Progress Notes (Signed)
Physical Therapy Treatment Patient Details Name: [name] MRN: [id number] DOB: 21-Nov-1960 Today's Date: 01/03/2021    History of Present Illness Patient is a 60 y/o female who presents on 12/31/20 with SOB, AMS, UI and LE pain. Recent Rt THA 12/24/20. Admitted with acute hypoxic hypocapnic respiratory failure secondary to PNA. PMH includes cirrhosis, COPD, HTN, depression and anxiety.    PT Comments    Pt participated in session requiring rest and O2 during ambulation. Pt requiring increased encouragement to ambulate further. Pt continues to demonstrate deficits in balance, strength, endurance, safety and gait and will benefit from skilled PT to address deficits to maximize independence with functional mobility prior to discharge.     Follow Up Recommendations  Home health PT;Supervision - Intermittent     Equipment Recommendations  None recommended by PT    Recommendations for Other Services       Precautions / Restrictions Precautions Precautions: Fall Restrictions Weight Bearing Restrictions: No RLE Weight Bearing: Weight bearing as tolerated Other Position/Activity Restrictions: WBAT    Mobility  Bed Mobility               General bed mobility comments: pt seen in recliner    Transfers Overall transfer level: Needs assistance Equipment used: Rolling walker (2 wheeled) Transfers: Sit to/from Stand Sit to Stand: Supervision            Ambulation/Gait Ambulation/Gait assistance: Supervision Gait Distance (Feet): 25 Feet Assistive device: Rolling walker (2 wheeled) Gait Pattern/deviations: Step-through pattern;Decreased stride length Gait velocity: decreased   General Gait Details: upon standing without O2 SpO2 droppoed to 87%, [place] replaced on 2 L and pt increased to 92%. Increased cueing needed to keep [place] in nose.   Stairs             Wheelchair Mobility    Modified Rankin (Stroke Patients Only)       Balance                                             Cognition                                              Exercises      General Comments General comments (skin integrity, edema, etc.): performed transfer to toilet from arm chair wtih RW wtih S. Pt able to stand and maintain balance wtih 1 UE on RW while performing hygiene and clothing management. S needed from therapist and increased time needed.      Pertinent Vitals/Pain Pain Score: 2  Pain Location: R hip and leg    Home Living                      Prior Function            PT Goals (current goals can now be found in the care plan section) Acute Rehab PT Goals Patient Stated Goal: to go home PT Goal Formulation: With patient Time For Goal Achievement: 01/15/21 Potential to Achieve Goals: Good Progress towards PT goals: Progressing toward goals    Frequency    Min 3X/week      PT Plan Current plan remains appropriate    Co-evaluation  AM-PAC PT "6 Clicks" Mobility   Outcome Measure  Help needed turning from your back to your side while in a flat bed without using bedrails?: None Help needed moving from lying on your back to sitting on the side of a flat bed without using bedrails?: None Help needed moving to and from a bed to a chair (including a wheelchair)?: A Little Help needed standing up from a chair using your arms (e.g., wheelchair or bedside chair)?: A Little Help needed to walk in hospital room?: A Little Help needed climbing 3-5 steps with a railing? : A Lot 6 Click Score: 19    End of Session Equipment Utilized During Treatment: Gait belt;Oxygen Activity Tolerance: Patient tolerated treatment well;Patient limited by lethargy Patient left: with call bell/phone within reach;in bed;with bed alarm set Nurse Communication: Mobility status PT Visit Diagnosis: Other abnormalities of gait and mobility (R26.89);Difficulty in walking, not elsewhere classified (R26.2)      Time: 7425-9563 PT Time Calculation (min) (ACUTE ONLY): 23 min  Charges:  $Gait Training: 8-22 mins $Therapeutic Activity: 8-22 mins                     Ginette Otto, DPT Acute Rehabilitation Services [phone]   Lucretia Field 01/03/2021, 12:05 PM

## 2021-01-03 NOTE — TOC Transition Note (Signed)
Transition of Care [facility]) - CM/SW Discharge Note   Patient Details  Name: [name] MRN: [id number] Date of Birth: 12-26-1960  Transition of Care [facility]) CM/SW Contact:  Beckie Busing, RN Phone Number: [phone]  01/03/2021, 12:37 PM   Clinical Narrative:    TOC following for disposition needs. Patient is currently active with [facility] and they have been made aware of pending d/c and HH orders. [facility] to resume services upon discharge. O2 anbulatory saturation screen meets criteria for Home O2. Home O2 has been ordered through [employer]. MD has been made aware and orders requested. DME tub/shower bench has been ordered per therapy recommendations. No other needs noted at this time. TOC will sign off.    Final next level of care: Home w Home Health Services Barriers to Discharge: No Barriers Identified   Patient Goals and CMS Choice Patient states their goals for this hospitalization and ongoing recovery are:: Patient states that she is ready to go home CMS Medicare.gov Compare Post Acute Care list provided to:: Patient Choice offered to / list presented to : Patient  Discharge Placement                       Discharge Plan and Services                DME Arranged: Oxygen DME Agency: Other - Comment [facility]) Date DME Agency Contacted: 01/03/21 Time DME Agency Contacted: 1230 Representative spoke with at DME Agency: Vaughan Basta HH Arranged: PT,OT HH Agency: [facility] Date [facility] Agency Contacted: 01/03/21 Time HH Agency Contacted: 1230 Representative spoke with at [facility] Agency: Currently active with [facility]  Social Determinants of Health (SDOH) Interventions     Readmission Risk Interventions No flowsheet data found.

## 2021-01-03 NOTE — Discharge Summary (Addendum)
Family Medicine Teaching [facility] Discharge Summary  Patient name: [name] Medical record number: [id number] Date of birth: [birth year] Age: 60 y.o. 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 female who presented with dyspnea. PMH is significant for AVN s/p R total hip arthroplasty, T2DM, alcohol abuse, cirrhosis, COPD, nicotine dependence.   Acute hypoxic respiratory failure 2/2 Pneumonia  Presented with worsening SOB since hip replacement surgery one week ago. On presentation patient tachypneic to 25 and febrile to 101. She required 4L [place] to maintain O2 sats and CXR was concerning for RML and RLL pneumonia. CTA negative for PE. Echo unremarkable. Patient received Vancomycin (5/30-5/31), Ceftriaxone (5/31-6/2), and doxycycline (5/31-6/2). She was discharged on PO Cefdinir and Doxy to complete a 5 day total course. Patient discharged home on 2L O2 due to desaturations into the high 80s on room  air.    Hypokalemia Patient required significant potassium repletion while admitted due to hypokalemia. Discharged home on PO potassium daily.  HTN BP elevated throughout admission. She was started on Losartan 25mg  daily in addition to her Amlodipine 10mg .   Iron Deficiency Anemia Patient with significant anemia while admitted (Hgb 7.7-8.3). Iron studies revealed iron 15 and saturation ratio of 5. She was started on daily iron supplementation.  Altered Mental Status, Anxiety There was concern for altered mental status at home with hallucinations prior to arrival. Patient was A&Ox4 without hallucinations on admission. CT head was normal. Her infection may have contributed. Psychiatry was consulted as patient had significant anxiety. Her Seroquel was increased to 300mg  and her sertraline increased to 150mg  with good effect.   T2DM A1c 6.5% on admission. Patient denied any home medications. CBGs were checked with meals and at bedtime and she was on sliding scale insulin while admitted.  Issues for Follow Up:  1. Follow-up respiratory status, determine if there is an ongoing need for home O2 or not 2. Patient needs workup for etiology of iron deficiency anemia. Suggest GI referral for colonoscopy etc.  3. Patient needs sleep study (witnessed snoring and apneic episodes at night, concerning for OSA). 4. Could consider workup for hyperaldosteronism or RTA given HTN, tachycardia, and hypokalemia  Significant Procedures: None  Significant Labs and Imaging:  Recent Labs  Lab 01/01/21 0157 01/02/21 0358 01/03/21 0244  WBC 9.3 14.7* 11.8*  HGB 7.7* 7.8* 7.7*  HCT 23.3* 24.5* 23.9*  PLT 403* 519* 472*   Recent Labs  Lab 12/31/20 1008 12/31/20 1056 12/31/20 1242 12/31/20 1913 01/01/21 0157 01/01/21 1302 01/02/21 0358 01/03/21 0244  NA 139   < >  --  135 134* 134* 137 136  K 2.9*   < >  --  2.7* 3.1* 3.6 3.2*  4.5  CL 98  --   --  93* 95* 96* 97* 103  CO2 29  --   --  28 29 24  26 26   GLUCOSE 191*  --   --  242* 298* 302* 189* 135*  BUN 7  --   --  5* 5* 7 6 7   CREATININE 0.74  --   --  0.75 0.67 0.71 0.78 0.72  CALCIUM 8.3*  --   --  8.2* 8.1* 8.3* 8.7* 8.6*  MG  --   --  1.6*  --  2.0  --   --  1.7  ALKPHOS 89  --   --   --  95  --   --   --   AST 19  --   --   --  18  --   --   --   ALT 19  --   --   --  18  --   --   --   ALBUMIN 2.7*  --   --   --  2.4*  --   --   --    < > = values in this interval not displayed.   Iron 15 Sat ratio 5 Ferritin 61  Results/Tests Pending at Time of Discharge: None  Discharge Medications:  Allergies as of 01/03/2021      Reactions   Tizanidine Other (See Comments)   May 2022- Reported hallucinations vs dreams w/ sleep walking at home when tizanidine added post-op    Amoxicillin    Yeast infection       Medication List    STOP taking these medications   furosemide 20 MG tablet Commonly known as: LASIX   oxybutynin 10 MG 24 hr tablet Commonly known as: DITROPAN-XL   tiZANidine 2 MG tablet Commonly known as: ZANAFLEX   VICKS DAYQUIL/NYQUIL COUGH PO     TAKE these medications   acetaminophen 325 MG tablet Commonly known as: TYLENOL Take 2 tablets (650 mg total) by mouth every 6 (six) hours as needed for mild pain (or Fever >/= 101).   ALLERGY EYE DROPS OP Place 1 drop into both eyes daily as needed (allergies).   amLODipine 10 MG tablet Commonly known as: NORVASC TAKE 1 TABLET BY MOUTH DAILY. What changed: how much to take   aspirin EC 81 MG tablet Take 1 tablet (81 mg total) by mouth 2 (two) times daily.   atorvastatin 40 MG tablet Commonly known as: LIPITOR Take 1 tablet (40 mg total) by mouth daily.   budesonide-formoterol 160-4.5 MCG/ACT inhaler Commonly known as: SYMBICORT Inhale 2 puffs into the lungs 2 (two) times daily.   cefdinir 300 MG capsule Commonly known as: OMNICEF Take 1 capsule (300 mg total) by mouth every 12 (twelve) hours for 2 days. Start taking on: January 04, 2021    doxycycline 100 MG tablet Commonly known as: VIBRA-TABS Take 1 tablet (100 mg total) by mouth every 12 (twelve) hours for 5 doses.   ferrous gluconate 324 MG tablet Commonly known as: FERGON Take 1 tablet (324 mg total) by mouth daily with lunch. Start taking on: January 04, 2021   folic acid 1 MG tablet Commonly known as: FOLVITE TAKE 1 TABLET (1 MG TOTAL) BY MOUTH DAILY. What changed: how much to take   guaiFENesin 600 MG 12 hr tablet Commonly known as: MUCINEX Take 600 mg by mouth 2 (two) times daily.   hydrOXYzine 50 MG tablet Commonly known as: ATARAX/VISTARIL TAKE 1 TABLET (  50 MG TOTAL) BY MOUTH 3 (THREE) TIMES DAILY AS NEEDED FOR ANXIETY. What changed:   how much to take  how to take this  when to take this  reasons to take this   Icy Hot 5 % Ptch Generic drug: Menthol Apply 1 patch topically daily as needed (hip pain).   losartan 25 MG tablet Commonly known as: COZAAR Take 1 tablet (25 mg total) by mouth daily. Start taking on: January 04, 2021   multivitamin with minerals Tabs tablet Take 1 tablet by mouth daily.   oxyCODONE-acetaminophen 5-325 MG tablet Commonly known as: PERCOCET/ROXICET Take 1 tablet by mouth every 4 (four) hours as needed for severe pain.   pantoprazole 40 MG tablet Commonly known as: PROTONIX Take 1 tablet (40 mg total) by mouth 2 (two) times daily.   POTASSIUM PO Take 1 tablet by mouth daily.   QUEtiapine 300 MG tablet Commonly known as: SEROQUEL Take 1 tablet (300 mg total) by mouth at bedtime. What changed:   medication strength  how much to take   sertraline 50 MG tablet Commonly known as: ZOLOFT Take 3 tablets (150 mg total) by mouth daily. Start taking on: January 04, 2021 What changed:   medication strength  how much to take  how to take this  when to take this   Trelegy Ellipta 100-62.5-25 MCG/INH Aepb Generic drug: Fluticasone-Umeclidin-Vilant Inhale 1 puff into the lungs daily.   VITAMIN D PO Take 1  capsule by mouth daily.            Durable Medical Equipment  (From admission, onward)         Start     Ordered   01/03/21 1246  For home use only DME Shower stool  Once       Comments: DME tub/ shower chair   01/03/21 1247   01/03/21 1224  For home use only DME oxygen  Once       Question Answer Comment  Length of Need 6 Months   Mode or (Route) Nasal cannula   Liters per Minute 2   Oxygen delivery system Gas      01/03/21 1224          Discharge Instructions: Please refer to Patient Instructions section of EMR for full details.  Patient was counseled important signs and symptoms that should prompt return to medical care, changes in medications, dietary instructions, activity restrictions, and follow up appointments.   Follow-Up Appointments:  Future Appointments  Date Time Provider Department Center  01/23/2021  2:00 PM Shanna Cisco, NP [facility] None     Maury Dus, MD 01/03/2021, 2:09 PM PGY-1, [facility]

## 2021-01-03 NOTE — Progress Notes (Addendum)
{  PT SATURATION QUALIFICATIONS: (This note is used to comply with regulatory documentation for home oxygen)  Patient Saturations on Room Air at Rest = 92%  Patient Saturations on Room Air while Ambulating = 87%  Patient Saturations on 2 Liters of oxygen while Ambulating = 92%  Please briefly explain why patient needs home oxygen:At rest pt maintain 92% off O2, upon standing pt dropped to 87%. O2 replaced and able to increase SpO2 to 92%.  Ginette Otto, DPT Acute Rehabilitation Services [phone]

## 2021-01-03 NOTE — Progress Notes (Addendum)
Family Medicine Teaching Service Daily Progress Note Intern Pager: [phone]  Patient name: [name] Medical record number: [id number] Date of birth: [birth year] Age: 60 y.o. Gender: female  Primary Care Provider: Arvilla Market, DO Consultants: Psychiatry (signed off) Code Status: Full  Pt Overview and Major Events to Date:  5/30: admitted  Assessment and Plan:  [name] is a 60yo female presenting with acute hypoxic respiratory failure 2/2 CAP. PMH significant for AVN s/p recent total hip arthroplasty, alcohol abuse, cirrhosis, COPD, nicotine use.  Acute hypoxic respiratory failure 2/2 CAP with risk factors for multidrug resistance  COPD  Denies SOB this morning. SpO2 93% on 2L. Blood cultures NGTD (3 days). Remains afebrile with WBC 11.8. Of note, she was witnessed to have snoring with brief apneic episodes overnight-- suspect a component of OSA. -Doxycycline 100mg  BID (5/31-6/4) -Transition to PO Cefdinir (6/3-6/4) -s/p Ceftriaxone (5/31-6/2) and vanc (5/30-5/31) -Will attempt to wean supplemental O2-- goal SpO2 88-92% -Albuterol 2 puffs q4h prn -Dulera 2 puffs BID -Incruse Ellipta 1 puff daily -Incentive spirometry -Consider BiPAP nightly while admitted. Recommend outpatient sleep study  HTN  Tachycardia BP 149/75 this morning. HR slightly improved in the low 100s. Tachycardia likely multifactorial secondary to anemia, anxiety, deconditioning, and pain. CTA was negative for PE. Given HTN, tachycardia, and hypokalemia could consider hypoaldosteronism or RTA workup as an outpatient. -Amlodipine 10mg  daily -Losartan 25mg  daily  Anxiety and Depression  Patient endorses some anxiety but feels her mood is overall stable. Appropriate affect on exam this morning. PT noted bizarre behavior and significant anxiety during their evaluation yesterday. - Psychiatry signed off, appreciate their recommendations - Zoloft 150mg  daily - Seroquel 300mg  qhs  - Continue  Hydroxyzine 50mg  TID prn  Iron Deficiency Anemia Hgb stable at 7.7. Iron panel revealed iron 15 and saturation ratio of 5. Certainly a component of iron deficiency anemia. May also be mixed with anemia of chronic disease (ferritin 68). Transfusion threshold 7.  - Start PO iron supplementation daily - Daily CBC  Hypokalemia-- resolved K 4.5 this morning after 120 mEq PO potassium supplementation yesterday. -40 mEq K daily  T2DM Glucose 135 on am labs. Required 9u SAI over past 24h. A1c 6.5% on 12/25/20. 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 X-ray of R hip shows replacement in satisfactory position. - Restart ASA 81mg  BID - Tylenol 650mg  q 6prn  - Oxycodone 2.5mg  q 6 prn  - PT/OT recommending home health PT/OT  GERD Chronic, stable. - Continue protonix 40mg  daily   Hx of liver cirrhosis  Hx alcohol abuse AST/ALT wnl. Denies current alcohol use, stating last drank alcohol about 3 years ago  Nicotine dependence Reports smoking .5ppd   - counsel on nicotine cessation   Protein calorie malnutrition Albumin 2.4, total protein 5.9, likely in the setting of low dietary intake  - consider ensure shakes for supplementation and MVI   Low Back Pain Lumbar x-ray unremarkable. -Tylenol and Oxycodone q6prn as above  FEN/GI: Carb modified diet PPx: Lovenox   Status is: Inpatient Remains inpatient appropriate because:Ongoing diagnostic testing needed not appropriate for outpatient work up   Dispo:  Patient From: Home  Planned Disposition: Home with Health Care Svc  Medically stable for discharge: No, but anticipate discharge this afternoon pending ambulatory O2 sats and arrangement of home health services/home O2.       Subjective:  Patient noted to be snoring  with apneic episodes overnight. Denies SOB. She does endorse ongoing right thigh pain  and lower back pain but otherwise feels well and denies complaints.  Objective: Temp:  [98.5 F (36.9 C)-98.7 F (37.1 C)] 98.6 F (37 C) (06/02 0500) Pulse Rate:  [100-112] 102 (06/02 0500) Resp:  [20] 20 (06/02 0500) BP: (128-155)/(58-72) 155/63 (06/02 0500) SpO2:  [80 %-94 %] 94 % (06/02 0500) Physical Exam: General: alert, well-appearing, NAD Cardiovascular: RRR, normal S1/S2 without m/r/g Respiratory: normal effort, diffuse expiratory wheezes (L>R) Abdomen: soft, nontender, nondistended Extremities: BLE nontender to palpation, no calf tenderness, trace edema bilaterally, normal ROM of bilateral lower extremities, 4/5 strength in R hip  Laboratory: Recent Labs  Lab 01/01/21 0157 01/02/21 0358 01/03/21 0244  WBC 9.3 14.7* 11.8*  HGB 7.7* 7.8* 7.7*  HCT 23.3* 24.5* 23.9*  PLT 403* 519* 472*   Recent Labs  Lab 12/31/20 1008 12/31/20 1056 01/01/21 0157 01/01/21 1302 01/02/21 0358 01/03/21 0244  NA 139   < > 134* 134* 137 136  K 2.9*   < > 3.1* 3.6 3.2* 4.5  CL 98   < > 95* 96* 97* 103  CO2 29   < > 29 24 26 26   BUN 7   < > 5* 7 6 7   CREATININE 0.74   < > 0.67 0.71 0.78 0.72  CALCIUM 8.3*   < > 8.1* 8.3* 8.7* 8.6*  PROT 6.1*  --  5.9*  --   --   --   BILITOT 1.2  --  0.9  --   --   --   ALKPHOS 89  --  95  --   --   --   ALT 19  --  18  --   --   --   AST 19  --  18  --   --   --   GLUCOSE 191*   < > 298* 302* 189* 135*   < > = values in this interval not displayed.   Iron 15 Sat ratio 5  Imaging/Diagnostic Tests: DG Lumbar Spine 1 View Result Date: 01/02/2021 CLINICAL DATA:  60 year old female with back pain. EXAM: LUMBAR SPINE - 1 VIEW COMPARISON:  Lumbar spine radiograph dated 08/14/2020. FINDINGS: Five lumbar type vertebra. No acute fracture or subluxation identified. Evaluation however is limited due to osteopenia and in the absence of lateral views. Partially visualized total right hip arthroplasty. Old fracture of the posterior right tenth rib. The soft  tissues are unremarkable. IMPRESSION: 1. No acute fracture or subluxation. 2. Osteopenia. Electronically Signed   By: 66 M.D.   On: 01/02/2021 17:31   DG HIP UNILAT WITH PELVIS 2-3 VIEWS RIGHT Result Date: 01/02/2021 CLINICAL DATA:  Right hip pain, history of prior hip replacement EXAM: DG HIP (WITH OR WITHOUT PELVIS) 2-3V RIGHT COMPARISON:  Intraop films from 12/24/2020 FINDINGS: Pelvic ring is intact. Right hip replacement is noted in satisfactory position. Previously seen radiopaque density on intraoperative films has been removed. Cerclage wire is noted along the proximal femur. Small bony fragment is noted adjacent to the greater trochanter of uncertain chronicity. No significant callus formation is noted. Correlate with any recent injury. IMPRESSION: Right hip replacement in satisfactory position. Mild fragmentation of the greater trochanter is noted of uncertain chronicity. Correlate with any recent injury. Electronically Signed   By: 03/04/2021 M.D.   On: 01/02/2021 17:42     Alcide Clever, MD 01/03/2021, 6:22 AM PGY-1, [facility] Family Medicine FPTS Intern pager: [phone],  text pages welcome

## 2021-01-03 NOTE — Discharge Instructions (Signed)
Dear [name],   Thank you for letting us participate in your care! In this section, you will find a brief summary of why you were admitted to the hospital, what happened during your admission, your diagnosis/diagnoses, and recommended follow up.   You were admitted because you were experiencing shortness of breath.   Your were diagnosed with pneumonia.   You were treated with antibiotics and oxygen.   You were also seen by psychiatry. They increased the doses of some of your medications.   Your breathing improved and you were discharged from the hospital for meeting this goal.    POST-HOSPITAL & CARE INSTRUCTIONS 1. You should continue your antibiotics through June 4th.  2. Several changes were made to your medications. Please see medications section of this packet for details. 3. Please let PCP/Specialists know of any changes in medications that were made.   DOCTOR'S APPOINTMENTS & FOLLOW UP Future Appointments  Date Time Provider Department Center  01/23/2021  2:00 PM Shanna Cisco, NP [facility] None     Thank you for choosing [facility]! Take care and be well!  Family Medicine Teaching Service Inpatient Team [place]  [facility]  [address] [place], [place] [address] [phone]

## 2021-01-04 ENCOUNTER — Telehealth: Payer: Self-pay

## 2021-01-04 NOTE — Telephone Encounter (Signed)
Transition Care Management Follow-up Telephone Call  Date of discharge and from where:[facility] on 01/03/2021 How have you been since you were released from the hospital? Pt stated still  experiencing hip pain at the moment  Any questions or concerns? Pt had asked if she should continue with folic acid ? Advised to take meds as instructed by MD  upon hospital discharge. Pt said she will f/u direcly  with Dr Earlene Plater on her schedule appt.   Items Reviewed: Did the pt receive and understand the discharge instructions provided? have the instructions and discharge paperwork.  Medications obtained and verified? She said that they have the medications at home  Any new allergies since your discharge? None reported  Do you have support at home? did not communicate if any support at home  Other (ie: DME, Home Health, etc)      Home O2 has been ordered through [employer].Stated she has already been reached out by the company. Tub/shower bench has been ordered per therapy recommendations. Pt stated she does not care for any of this at this point and cut off any further conversations. Patient thanked his nurse for the scheduled appt and said she will f/u with her PCP on Monday.   Follow up appointments reviewed: PCP Hospital f/u appt confirmed?Dr Earlene Plater on 01/07/2021 @ 11:10am

## 2021-01-05 LAB — CULTURE, BLOOD (ROUTINE X 2)
Culture: NO GROWTH
Culture: NO GROWTH
Special Requests: ADEQUATE
Special Requests: ADEQUATE

## 2021-01-07 ENCOUNTER — Other Ambulatory Visit: Payer: Self-pay

## 2021-01-07 ENCOUNTER — Encounter: Payer: Self-pay | Admitting: Internal Medicine

## 2021-01-07 ENCOUNTER — Telehealth (INDEPENDENT_AMBULATORY_CARE_PROVIDER_SITE_OTHER): Payer: Medicaid Other | Admitting: Internal Medicine

## 2021-01-07 DIAGNOSIS — N3281 Overactive bladder: Secondary | ICD-10-CM

## 2021-01-07 DIAGNOSIS — Z09 Encounter for follow-up examination after completed treatment for conditions other than malignant neoplasm: Secondary | ICD-10-CM | POA: Diagnosis not present

## 2021-01-07 DIAGNOSIS — R0683 Snoring: Secondary | ICD-10-CM | POA: Diagnosis not present

## 2021-01-07 DIAGNOSIS — F411 Generalized anxiety disorder: Secondary | ICD-10-CM

## 2021-01-07 DIAGNOSIS — J449 Chronic obstructive pulmonary disease, unspecified: Secondary | ICD-10-CM

## 2021-01-07 MED ORDER — FLUTICASONE FUROATE-VILANTEROL 100-25 MCG/INH IN AEPB
1.0000 | INHALATION_SPRAY | Freq: Every day | RESPIRATORY_TRACT | 11 refills | Status: DC
  Filled 2021-01-07 (×2): qty 60, 30d supply, fill #0

## 2021-01-07 MED ORDER — BUSPIRONE HCL 5 MG PO TABS
5.0000 mg | ORAL_TABLET | Freq: Two times a day (BID) | ORAL | 0 refills | Status: DC
Start: 2021-01-07 — End: 2021-02-13
  Filled 2021-01-07: qty 60, 30d supply, fill #0

## 2021-01-07 MED ORDER — OXYBUTYNIN CHLORIDE ER 15 MG PO TB24
15.0000 mg | ORAL_TABLET | Freq: Every day | ORAL | 0 refills | Status: DC
  Filled 2021-01-07: qty 90, 90d supply, fill #0

## 2021-01-07 NOTE — Progress Notes (Signed)
Still has incontinence  Taking oxybutynin, but not better Mentions need ATB and inhalers  Discuss anxiety

## 2021-01-07 NOTE — Progress Notes (Signed)
Virtual Visit via Telephone Note  I connected with [name], on 01/08/2021 at 11:33 AM by telephone due to the COVID-19 pandemic and verified that I am speaking with the correct person using two identifiers.   Consent: I discussed the limitations, risks, security and privacy concerns of performing an evaluation and management service by telephone and the availability of in person appointments. I also discussed with the patient that there may be a patient responsible charge related to this service. The patient expressed understanding and agreed to proceed.   Location of Patient: Home   Location of Provider: Clinic    Persons participating in Telemedicine visit: [name] Guy Franco- RN Dr. Earlene Plater      History of Present Illness: Patient has a visit for hospital discharge.   Does not monitor her O2 saturation at home. She is wearing 2L O2 at nighttime. She completed courses of antibiotics (Cefdinir and Doxycyline) that she was discharged with.   GAD 7 : Generalized Anxiety Score 01/07/2021 12/11/2020 05/22/2020 12/22/2019  Nervous, Anxious, on Edge 2 2 3 3   Control/stop worrying 2 2 3 3   Worry too much - different things 0 2 3 3   Trouble relaxing 0 2 3 3   Restless 0 1 3 3   Easily annoyed or irritable 2 3 3 3   Afraid - awful might happen 0 1 0 3  Total GAD 7 Score 6 13 18 21   Anxiety Difficulty - - - Extremely difficult  Some encounter information is confidential and restricted. Go to Review Flowsheets activity to see all data.      Past Medical History:  Diagnosis Date  . Anxiety   . Arthritis   . COPD (chronic obstructive pulmonary disease) (HCC)   . Depression   . Dyspnea   . GERD (gastroesophageal reflux disease)   . Hypertension   . Peripheral edema    2+ at PAT visit   Allergies  Allergen Reactions  . Tizanidine Other (See Comments)    May 2022- Reported hallucinations vs dreams w/ sleep walking at home when tizanidine added post-op   . Amoxicillin      Yeast infection     Current Outpatient Medications on File Prior to Visit  Medication Sig Dispense Refill  . acetaminophen (TYLENOL) 325 MG tablet Take 2 tablets (650 mg total) by mouth every 6 (six) hours as needed for mild pain (or Fever >/= 101).    amLODipine (NORVASC) 10 MG tablet TAKE 1 TABLET BY MOUTH DAILY. (Patient taking differently: Take 10 mg by mouth daily.) 90 tablet 2  . aspirin EC 81 MG tablet Take 1 tablet (81 mg total) by mouth 2 (two) times daily. 60 tablet 0  . atorvastatin (LIPITOR) 40 MG tablet Take 1 tablet (40 mg total) by mouth daily. 90 tablet 3  . ferrous gluconate (FERGON) 324 MG tablet Take 1 tablet (324 mg total) by mouth daily with lunch. 30 tablet 0  . folic acid (FOLVITE) 1 MG tablet TAKE 1 TABLET (1 MG TOTAL) BY MOUTH DAILY. (Patient taking differently: Take 1 mg by mouth daily.) 90 tablet 0  . guaiFENesin (MUCINEX) 600 MG 12 hr tablet Take 600 mg by mouth 2 (two) times daily.    . hydrOXYzine (ATARAX/VISTARIL) 50 MG tablet TAKE 1 TABLET (50 MG TOTAL) BY MOUTH 3 (THREE) TIMES DAILY AS NEEDED FOR ANXIETY. (Patient taking differently: Take 50 mg by mouth every 8 (eight) hours as needed for anxiety.) 90 tablet 2  . Ketotifen Fumarate (ALLERGY EYE DROPS  OP) Place 1 drop into both eyes daily as needed (allergies).    . losartan (COZAAR) 25 MG tablet Take 1 tablet (25 mg total) by mouth daily. 30 tablet 0  . Menthol (ICY HOT) 5 % PTCH Apply 1 patch topically daily as needed (hip pain).    . Multiple Vitamin (MULTIVITAMIN WITH MINERALS) TABS tablet Take 1 tablet by mouth daily.    Marland Kitchen oxyCODONE-acetaminophen (PERCOCET/ROXICET) 5-325 MG tablet Take 1 tablet by mouth every 4 (four) hours as needed for severe pain. 30 tablet 0  . pantoprazole (PROTONIX) 40 MG tablet Take 1 tablet (40 mg total) by mouth 2 (two) times daily. 60 tablet 5  . POTASSIUM PO Take 1 tablet by mouth daily.    . QUEtiapine (SEROQUEL) 300 MG tablet Take 1 tablet (300 mg total) by mouth at bedtime.  30 tablet 0  . sertraline (ZOLOFT) 100 MG tablet Take 1.5 tablets (150 mg total) by mouth daily. 45 tablet 0  . VITAMIN D PO Take 1 capsule by mouth daily.     No current facility-administered medications on file prior to visit.    Observations/Objective: NAD. Speaking clearly.  Work of breathing normal.  Alert and oriented. Mood appropriate.   Assessment and Plan: 1. Hospital discharge follow-up Reviewed hospital course, current medications, ensured proper f/u in place, and addressed concerns.  Will ultimately need to return for in person evaluation of necessity of O2 use. Walk test will be needed.   2. Overactive bladder Trial increase Oxybutynin from 10 to 15 mg.  - oxybutynin (DITROPAN XL) 15 MG 24 hr tablet; Take 1 tablet (15 mg total) by mouth at bedtime.  Dispense: 90 tablet; Refill: 0  3. Generalized anxiety disorder While GAD-7 score has improved, she reports her anxiety is not well controlled. Continue Zoloft 150 mg. Start Buspar 5 mg BID. Discussed this may need to be titrated to more optimal dose but will start low to minimize side effects. Discussed potential side effects. Follow up in 4-6 weeks.  - busPIRone (BUSPAR) 5 MG tablet; Take 1 tablet (5 mg total) by mouth 2 (two) times daily.  Dispense: 60 tablet; Refill: 0  4. Snoring Noted to have snoring during hospitalization. Warrants work up for OSA.  - PSG Sleep Study; Future  5. Chronic obstructive pulmonary disease, unspecified COPD type (HCC) Her insurance did not cover Trelegy. Was prescribed Breo during hospitalization and this was covered. Will send in Rx. Currently using O2 at home since being discharged from hospital. She has no means of monitoring O2 saturation. Will ultimately need to return for in person evaluation of necessity of O2 use. Walk test will be needed.  - fluticasone furoate-vilanterol (BREO ELLIPTA) 100-25 MCG/INH AEPB; Inhale 1 puff into the lungs daily.  Dispense: 60 each; Refill:  11    Follow Up Instructions: 4-6 week follow up for GAD; follow up for assessment of hypoxia/need for chronic O2 use in setting of COPD   I discussed the assessment and treatment plan with the patient. The patient was provided an opportunity to ask questions and all were answered. The patient agreed with the plan and demonstrated an understanding of the instructions.   The patient was advised to call back or seek an in-person evaluation if the symptoms worsen or if the condition fails to improve as anticipated.     I provided 18 minutes total of non-face-to-face time during this encounter including median intraservice time, reviewing previous notes, investigations, ordering medications, medical decision making, coordinating care and  patient verbalized understanding at the end of the visit.    Marcy Siren, D.O. Primary Care at [facility]  01/08/2021, 11:33 AM

## 2021-01-08 ENCOUNTER — Other Ambulatory Visit: Payer: Self-pay

## 2021-01-09 ENCOUNTER — Other Ambulatory Visit: Payer: Self-pay

## 2021-01-21 ENCOUNTER — Telehealth (HOSPITAL_COMMUNITY): Payer: Self-pay | Admitting: Psychiatry

## 2021-01-21 NOTE — Telephone Encounter (Signed)
Patient called to cancel 01/23/21 appointment with Doyne Keel for med management. Pt states she had hip surgery and requested reschedule to late August for f/u with Toy Cookey, NP, as well as with Deloris Ping LCSW for therapy.

## 2021-01-23 ENCOUNTER — Encounter (HOSPITAL_COMMUNITY): Payer: Medicaid Other | Admitting: Psychiatry

## 2021-01-24 ENCOUNTER — Other Ambulatory Visit: Payer: Self-pay

## 2021-01-24 ENCOUNTER — Telehealth: Payer: Self-pay | Admitting: Internal Medicine

## 2021-01-24 DIAGNOSIS — M87051 Idiopathic aseptic necrosis of right femur: Secondary | ICD-10-CM

## 2021-01-24 MED ORDER — FOLIC ACID 1 MG PO TABS: ORAL_TABLET | Freq: Every day | ORAL | 0 refills | Status: DC

## 2021-01-24 NOTE — Telephone Encounter (Signed)
1) Medication(s) Requested (by name):  folic acid (FOLVITE) 1 MG tablet   2) Pharmacy of Choice:  [facility] [id number] - [place], [place] - [address] AT [facility] [address]  [address] [place] [address]  Phone:  [phone]  Fax:  [phone]  DEA #:  [id number]

## 2021-01-24 NOTE — Progress Notes (Signed)
Folic acid refilled

## 2021-01-29 ENCOUNTER — Telehealth: Payer: Self-pay | Admitting: Internal Medicine

## 2021-01-29 NOTE — Telephone Encounter (Signed)
Pt says she confirmed w/ pharmacy and she's told she needed to contact PCP for refill due to 200 mg to 300 increase.   Rx #: [id number]  QUEtiapine (SEROQUEL) 300 MG tablet [[id number]]   [id number] - [place], [place] - [phone] AT [facility] [address]  [address] [place] [address]  Phone:  [phone]  Fax:  [phone]   Please advise and thank you

## 2021-01-31 NOTE — Telephone Encounter (Signed)
Msg received from [place]-  pt [name] mrn [phone] calling back stating Dr. Anner Crete is from the hospital where those medications were prescribed  Patient was advise to return to office for OV. Will route message to see otherwise.  Please advise if able to refill.

## 2021-01-31 NOTE — Telephone Encounter (Signed)
Called patient- left on voicemail to contact Dr. Anner Crete for medication refill. Medication was not prescribed by Dr. Earlene Plater. Could call office back for additional questions.

## 2021-01-31 NOTE — Telephone Encounter (Signed)
Patient is prescribed Quetiapine (Seroquel)  for anxiety and altered mental status.   The same was not refilled at most recent visit for hospital discharge follow-up with Marcy Siren, DO on 01/07/2021. Only Buspirone (Buspar) was refilled at that time.   Patient had appointment with [facility] on 01/23/2021 for medication management and canceled appointment.   Patient encouraged to request Quetiapine (Seroquel) refills from [facility] pending next appointment.

## 2021-02-08 ENCOUNTER — Other Ambulatory Visit (INDEPENDENT_AMBULATORY_CARE_PROVIDER_SITE_OTHER): Payer: Self-pay | Admitting: Primary Care

## 2021-02-12 NOTE — Progress Notes (Signed)
Patient ID: [name], female    DOB: [birth year]  MRN: [id number]  CC: Hypertension Follow-Up  Subjective: [name] is a 60 y.o. female who presents for hypertension follow-up.   Her concerns today include:   HYPERTENSION FOLLOW-UP: 03/08/2020 per DO note: Does not monitor. Continue current therapy. No red flag symptoms. Will monitor at upcoming visits.  02/13/2021: Reports she did not take blood pressure medications today because she did not eat. She does not want blood pressure rechecked. Does not monitor blood pressure at home. Does not have a blood pressure monitor at home. Denies shortness of breath, chest pain, and worst headache of life.   2. ANXIETY FOLLOW-UP: 01/07/2021 per DO note: While GAD-7 score has improved, she reports her anxiety is not well controlled. Continue Zoloft 150 mg. Start Buspar 5 mg BID. Discussed this may need to be titrated to more optimal dose but will start low to minimize side effects. Discussed potential side effects. Follow up in 4-6 weeks.  - busPIRone (BUSPAR) 5 MG tablet; Take 1 tablet (5 mg total) by mouth 2 (two) times daily.  Dispense: 60 tablet; Refill: 0  01/31/2021:  Patient had appointment with [facility] on 01/23/2021 for medication management and canceled appointment.  02/15/2021: Reports she is seeing Psychiatry and scheduled to see then within the next 1 week.   3. LEFT HIP PAIN: Reports right hip replacement about 1 month ago. Since then left hip feeling numb. Also, endorses bilateral low back pain and left hand ring and pinky finger numbness. Reports she followed up with Orthopedics and was told that someone else would need to look at her left hip.   Depression screen [facility] 2/9 02/13/2021 01/07/2021 12/11/2020 05/22/2020 12/22/2019  Decreased Interest 0 0 3 3 3   Down, Depressed, Hopeless 0 0 3 1 2   PHQ - 2 Score 0 0 6 4 5   Altered sleeping 0 0 3 3 3   Tired, decreased energy 0 0 3 3 3   Change in appetite  0 0 3 0 3  Feeling bad or failure about yourself  0 0 3 2 3   Trouble concentrating 0 0 1 0 3  Moving slowly or fidgety/restless 0 0 1 3 3   Suicidal thoughts 0 0 0 0 0  PHQ-9 Score 0 0 20 15 23   Some encounter information is confidential and restricted. Go to Review Flowsheets activity to see all data.     Patient Active Problem List   Diagnosis Date Noted   Hypoxia    Hypokalemia    Wheezing    Pneumonia 12/31/2020   History of total hip arthroplasty, right 12/24/2020   Type 2 diabetes mellitus (HCC) 12/18/2020   Avascular necrosis of bone of right hip (HCC) 09/17/2020   Greater trochanteric pain syndrome of left lower extremity 07/24/2020   Rheumatoid factor positive 06/26/2020   CRP elevated 06/26/2020   Bilateral hand pain 06/26/2020   Stasis dermatitis of both legs 06/26/2020   Alcohol use disorder, severe, in sustained remission (HCC) 04/30/2020   Chronic sinusitis 04/10/2020   Liver cirrhosis (HCC) 03/08/2020   Major depressive disorder, recurrent episode, moderate with anxious distress (HCC) 02/11/2020   Alcohol abuse, in remission 12/16/2019   Nicotine dependence, cigarettes, uncomplicated 12/16/2019   Generalized anxiety disorder 12/16/2019     Current Outpatient Medications on File Prior to Visit  Medication Sig Dispense Refill   aspirin EC 81 MG tablet Take 1 tablet (81 mg total) by mouth 2 (two) times daily.  60 tablet 0   atorvastatin (LIPITOR) 40 MG tablet Take 1 tablet (40 mg total) by mouth daily. 90 tablet 3   ferrous gluconate (FERGON) 324 MG tablet Take 1 tablet (324 mg total) by mouth daily with lunch. 30 tablet 0   folic acid (FOLVITE) 1 MG tablet TAKE 1 TABLET (1 MG TOTAL) BY MOUTH DAILY. 90 tablet 0   hydrOXYzine (ATARAX/VISTARIL) 50 MG tablet TAKE 1 TABLET (50 MG TOTAL) BY MOUTH 3 (THREE) TIMES DAILY AS NEEDED FOR ANXIETY. (Patient taking differently: Take 50 mg by mouth every 8 (eight) hours as needed for anxiety.) 90 tablet 2   Multiple Vitamin  (MULTIVITAMIN WITH MINERALS) TABS tablet Take 1 tablet by mouth daily.     oxybutynin (DITROPAN XL) 15 MG 24 hr tablet Take 1 tablet (15 mg total) by mouth at bedtime. 90 tablet 0   pantoprazole (PROTONIX) 40 MG tablet Take 1 tablet (40 mg total) by mouth 2 (two) times daily. 60 tablet 5   POTASSIUM PO Take 1 tablet by mouth daily.     QUEtiapine (SEROQUEL) 300 MG tablet Take 1 tablet (300 mg total) by mouth at bedtime. 30 tablet 0   tiZANidine (ZANAFLEX) 2 MG tablet Take 2 mg by mouth.     traMADol (ULTRAM) 50 MG tablet Take 50 mg by mouth every 6 (six) hours as needed.     VITAMIN D PO Take 1 capsule by mouth daily.     acetaminophen (TYLENOL) 325 MG tablet Take 2 tablets (650 mg total) by mouth every 6 (six) hours as needed for mild pain (or Fever >/= 101).     fluticasone furoate-vilanterol (BREO ELLIPTA) 100-25 MCG/INH AEPB Inhale 1 puff into the lungs daily. (Patient not taking: Reported on 02/13/2021) 60 each 11   Ketotifen Fumarate (ALLERGY EYE DROPS OP) Place 1 drop into both eyes daily as needed (allergies).     Menthol (ICY HOT) 5 % PTCH Apply 1 patch topically daily as needed (hip pain).     oxyCODONE-acetaminophen (PERCOCET/ROXICET) 5-325 MG tablet Take 1 tablet by mouth every 4 (four) hours as needed for severe pain. 30 tablet 0   No current facility-administered medications on file prior to visit.    Allergies  Allergen Reactions   Tizanidine Other (See Comments)    May 2022- Reported hallucinations vs dreams w/ sleep walking at home when tizanidine added post-op    Amoxicillin     Yeast infection     Social History   Socioeconomic History   Marital status: Single    Spouse name: Not on file   Number of children: 0   Years of education: 12   Highest education level: GED or equivalent  Occupational History   Occupation: Armed forces operational officerACCOUNTING CLERK  Tobacco Use   Smoking status: Every Day    Packs/day: 1.00    Years: 25.00    Pack years: 25.00    Types: Cigarettes    Start  date: 08/04/1978   Smokeless tobacco: Never  Vaping Use   Vaping Use: Never used  Substance and Sexual Activity   Alcohol use: Not Currently   Drug use: Never   Sexual activity: Never  Other Topics Concern   Not on file  Social History Narrative   Lives and step-dad, they are able to help.   Social Determinants of Health   Financial Resource Strain: Not on file  Food Insecurity: Not on file  Transportation Needs: Not on file  Physical Activity: Not on file  Stress: Not on  file  Social Connections: Not on file  Intimate Partner Violence: Not on file    Family History  Problem Relation Age of Onset   Ulcers Father    Hypotension Sister    Hypertension Brother     Past Surgical History:  Procedure Laterality Date   ABDOMINAL HYSTERECTOMY  1986   CARPAL TUNNEL RELEASE Bilateral 1980   Per patient   SHOULDER SURGERY Left 1980   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   Procedure: RIGHT TOTAL HIP ARTHROPLASTY ANTERIOR APPROACH;  Surgeon: Gean Birchwood, MD;  Location: [facility];  Service: Orthopedics;  Laterality: Right;    ROS: Review of Systems Negative except as stated above  PHYSICAL EXAM: BP (!) 168/92 (BP Location: Right Arm, Patient Position: Sitting, Cuff Size: Normal)   Pulse 100   Temp 98.1 F (36.7 C)   Resp 16   Ht 5' 2.01" (1.575 m)   Wt 194 lb (88 kg)   SpO2 92%   BMI 35.47 kg/m   Physical Exam HENT:     Head: Normocephalic and atraumatic.  Eyes:     Extraocular Movements: Extraocular movements intact.     Conjunctiva/sclera: Conjunctivae normal.     Pupils: Pupils are equal, round, and reactive to light.  Cardiovascular:     Rate and Rhythm: Normal rate and regular rhythm.     Pulses: Normal pulses.     Heart sounds: Normal heart sounds.  Pulmonary:     Effort: Pulmonary effort is normal.     Breath sounds: Normal breath sounds.  Musculoskeletal:     Cervical back: Normal range of motion and neck supple.  Neurological:     General: No focal  deficit present.     Mental Status: She is alert and oriented to person, place, and time.  Psychiatric:        Mood and Affect: Mood normal.        Behavior: Behavior normal.   ASSESSMENT AND PLAN: 1. Essential hypertension: - Blood pressure not at goal during today's visit. Patient asymptomatic without chest pressure, chest pain, palpitations, shortness of breath, and worst headache of life.  - Patient declined blood pressure recheck reporting she has not taken blood pressure medication because she did not have anything to eat this morning.  - Level of blood pressure control unknown as patient does not monitor in the home setting.  - Continue Amlodipine and Losartan as prescribed.  - Counseled on blood pressure goal of less than 130/80, low-sodium, DASH diet, medication compliance, 150 minutes of moderate intensity exercise per week as tolerated. Discussed medication compliance, adverse effects. - Considering patient has not had blood pressure medications for the day as of present will have her follow-up in 1 week with clinical pharmacist for blood pressure check. Medications may be adjusted at that time if needed.  - If blood pressure not at goal at clinical pharmacist checkup consider adding Hydrochlorothiazide and have patient follow-up with primary provider within 2 weeks.  - amLODipine (NORVASC) 10 MG tablet; Take 1 tablet (10 mg total) by mouth daily.  Dispense: 30 tablet; Refill: 0 - losartan (COZAAR) 25 MG tablet; Take 1 tablet (25 mg total) by mouth daily.  Dispense: 30 tablet; Refill: 0  2. Generalized anxiety disorder: - Patient denies thoughts of self-harm, suicidal ideations, and homicidal ideations.  - Continue Buspirone and Sertraline as prescribed. - Patient followed by Psychiatry. Reports she has an appointment scheduled with them within the next 1 week. - Psychiatry managing refills of Quetiapine.  -  Follow-up with primary provider as scheduled.  - busPIRone (BUSPAR) 5 MG  tablet; Take 1 tablet (5 mg total) by mouth 2 (two) times daily.  Dispense: 60 tablet; Refill: 1 - sertraline (ZOLOFT) 100 MG tablet; Take 1.5 tablets (150 mg total) by mouth daily.  Dispense: 45 tablet; Refill: 1  3. Chronic bilateral low back pain, unspecified whether sciatica present: - Diagnostic x-ray lumbar spine for further evaluation. Patient reports she will schedule appointment to return to office and have completed at a later date.  - Follow-up with primary provider as scheduled.   4. Left hip pain: - Diagnostic x-ray left hip for further evaluation. Patient reports she will schedule appointment to return to office and have completed at a later date.  - Begin Gabapentin as prescribed. May cause drowsiness. Counseled patient to not consume if operating heavy machinery or driving. Counseled patient to not consume with alcohol or illicit substances. Patient verbalized understanding.  - Follow-up with primary provider as scheduled.  - gabapentin (NEURONTIN) 100 MG capsule; Take 1 capsule (100 mg total) by mouth at bedtime.  Dispense: 30 capsule; Refill: 0  5. Finger numbness: - Begin Gabapentin as prescribed. May cause drowsiness. Counseled patient to not consume if operating heavy machinery or driving. Counseled patient to not consume with alcohol or illicit substances. Patient verbalized understanding.  - Follow-up with primary provider as scheduled.  - gabapentin (NEURONTIN) 100 MG capsule; Take 1 capsule (100 mg total) by mouth at bedtime.  Dispense: 30 capsule; Refill: 0    Patient was given the opportunity to ask questions.  Patient verbalized understanding of the plan and was able to repeat key elements of the plan. Patient was given clear instructions to go to Emergency Department or return to medical center if symptoms don't improve, worsen, or new problems develop.The patient verbalized understanding.   Requested Prescriptions   Signed Prescriptions Disp Refills    amLODipine (NORVASC) 10 MG tablet 30 tablet 0    Sig: Take 1 tablet (10 mg total) by mouth daily.   losartan (COZAAR) 25 MG tablet 30 tablet 0    Sig: Take 1 tablet (25 mg total) by mouth daily.   busPIRone (BUSPAR) 5 MG tablet 60 tablet 1    Sig: Take 1 tablet (5 mg total) by mouth 2 (two) times daily.   sertraline (ZOLOFT) 100 MG tablet 45 tablet 1    Sig: Take 1.5 tablets (150 mg total) by mouth daily.   gabapentin (NEURONTIN) 100 MG capsule 30 capsule 0    Sig: Take 1 capsule (100 mg total) by mouth at bedtime.    Return in about 2 weeks (around 02/27/2021) for Follow-Up hypertension .  Rema Fendt, NP

## 2021-02-13 ENCOUNTER — Ambulatory Visit (INDEPENDENT_AMBULATORY_CARE_PROVIDER_SITE_OTHER): Payer: Medicaid Other | Admitting: Family

## 2021-02-13 ENCOUNTER — Encounter: Payer: Self-pay | Admitting: Family

## 2021-02-13 ENCOUNTER — Other Ambulatory Visit: Payer: Self-pay

## 2021-02-13 VITALS — BP 168/92 | HR 100 | Temp 98.1°F | Resp 16 | Ht 62.01 in | Wt 194.0 lb

## 2021-02-13 DIAGNOSIS — I10 Essential (primary) hypertension: Secondary | ICD-10-CM | POA: Diagnosis not present

## 2021-02-13 DIAGNOSIS — F411 Generalized anxiety disorder: Secondary | ICD-10-CM | POA: Diagnosis not present

## 2021-02-13 DIAGNOSIS — M545 Low back pain, unspecified: Secondary | ICD-10-CM

## 2021-02-13 DIAGNOSIS — M25552 Pain in left hip: Secondary | ICD-10-CM | POA: Diagnosis not present

## 2021-02-13 DIAGNOSIS — R200 Anesthesia of skin: Secondary | ICD-10-CM

## 2021-02-13 DIAGNOSIS — G8929 Other chronic pain: Secondary | ICD-10-CM

## 2021-02-13 MED ORDER — LOSARTAN POTASSIUM 25 MG PO TABS: 25.0000 mg | ORAL_TABLET | Freq: Every day | ORAL | 0 refills | Status: DC

## 2021-02-13 MED ORDER — GABAPENTIN 100 MG PO CAPS
100.0000 mg | ORAL_CAPSULE | Freq: Every day | ORAL | 0 refills | Status: DC
Start: 2021-02-13 — End: 2021-03-07

## 2021-02-13 MED ORDER — AMLODIPINE BESYLATE 10 MG PO TABS: 10.0000 mg | ORAL_TABLET | Freq: Every day | ORAL | 0 refills | Status: DC

## 2021-02-13 MED ORDER — BUSPIRONE HCL 5 MG PO TABS: 5.0000 mg | ORAL_TABLET | Freq: Two times a day (BID) | ORAL | 1 refills | Status: DC

## 2021-02-13 MED ORDER — SERTRALINE HCL 100 MG PO TABS: 150.0000 mg | ORAL_TABLET | Freq: Every day | ORAL | 1 refills | Status: DC

## 2021-02-13 NOTE — Progress Notes (Signed)
Pt presents for hypertension follow-up, pt stated she has not taken BP meds Medication refills needed on Amlodipine, Buspar, losartan, Seroquel, and Zoloft

## 2021-02-15 ENCOUNTER — Telehealth (HOSPITAL_COMMUNITY): Payer: Self-pay | Admitting: Psychiatry

## 2021-02-15 ENCOUNTER — Other Ambulatory Visit (HOSPITAL_COMMUNITY): Payer: Self-pay | Admitting: Psychiatry

## 2021-02-15 MED ORDER — QUETIAPINE FUMARATE 300 MG PO TABS: 300.0000 mg | ORAL_TABLET | Freq: Every day | ORAL | 2 refills | Status: DC

## 2021-02-15 NOTE — Telephone Encounter (Signed)
Patient called for REFILL:  SEROQUEL 30MG  Pharmacy :  [facility] Patient phone number: [phone] Last seen:  11/05/20 COMMENTS: Prescribed 01/03/21 while IP with lung infection. No Refills

## 2021-02-15 NOTE — Telephone Encounter (Signed)
Medication refilled and sent to preferred pharmacy

## 2021-02-27 ENCOUNTER — Encounter: Payer: Medicaid Other | Admitting: Family

## 2021-02-27 NOTE — Progress Notes (Signed)
Patient did not show for appointment.   

## 2021-03-07 ENCOUNTER — Ambulatory Visit (INDEPENDENT_AMBULATORY_CARE_PROVIDER_SITE_OTHER): Payer: Medicaid Other | Admitting: Family

## 2021-03-07 ENCOUNTER — Encounter: Payer: Self-pay | Admitting: Family

## 2021-03-07 ENCOUNTER — Other Ambulatory Visit: Payer: Self-pay

## 2021-03-07 VITALS — BP 156/80 | HR 109 | Temp 98.3°F | Resp 18 | Ht 62.01 in | Wt 188.0 lb

## 2021-03-07 DIAGNOSIS — E119 Type 2 diabetes mellitus without complications: Secondary | ICD-10-CM | POA: Diagnosis not present

## 2021-03-07 DIAGNOSIS — G609 Hereditary and idiopathic neuropathy, unspecified: Secondary | ICD-10-CM | POA: Diagnosis not present

## 2021-03-07 DIAGNOSIS — J0100 Acute maxillary sinusitis, unspecified: Secondary | ICD-10-CM

## 2021-03-07 DIAGNOSIS — R200 Anesthesia of skin: Secondary | ICD-10-CM

## 2021-03-07 DIAGNOSIS — I10 Essential (primary) hypertension: Secondary | ICD-10-CM

## 2021-03-07 DIAGNOSIS — M25552 Pain in left hip: Secondary | ICD-10-CM

## 2021-03-07 LAB — GLUCOSE, POCT (MANUAL RESULT ENTRY): POC Glucose: 156 mg/dl — AB (ref 70–99)

## 2021-03-07 LAB — POCT GLYCOSYLATED HEMOGLOBIN (HGB A1C): Hemoglobin A1C: 6.1 % — AB (ref 4.0–5.6)

## 2021-03-07 MED ORDER — AZITHROMYCIN 250 MG PO TABS: ORAL_TABLET | ORAL | 0 refills | Status: AC

## 2021-03-07 MED ORDER — LOSARTAN POTASSIUM 25 MG PO TABS
ORAL_TABLET | ORAL | 0 refills | Status: DC
Start: 2021-03-07 — End: 2021-04-04

## 2021-03-07 MED ORDER — GABAPENTIN 100 MG PO CAPS: 100.0000 mg | ORAL_CAPSULE | Freq: Every day | ORAL | 0 refills | Status: DC

## 2021-03-07 NOTE — Progress Notes (Signed)
Pt presents for hypertension follow -up, pt states that she has been dealing with sinus pressure and congestion for approx 1 week

## 2021-03-07 NOTE — Patient Instructions (Signed)
Cozaar dose increased today Cut back on smoking Stay hydrated Follow up in 4 weeks with PCP for elevated BP Report now or worsening symptoms to the clinic or local ER. Gabapentin dose reordered Make sure you follow up with Xray as ordered

## 2021-03-07 NOTE — Progress Notes (Signed)
[name], is a 60 y.o. female  WNU:[id number]  IHK:[id number]  DOB - March 05, 1961  Subjective:  Chief Complaint and HPI: [name] is a 60 y.o. female here today with complaints of left hip pain, sinus pressure, dry cough x1 week.  Previously seen at the clinic and x-rays were ordered, got a chance to do those.  Reports sinus pressure sling when she bends over and now she has intermittent dry cough.  No one taking amoxicillin due to yeast infection the past.  Reports hospitalization for pneumonia about a month ago.   ED/Hospital notes reviewed.    ROS:   Constitutional:  No f/c, No night sweats, No unexplained weight loss. EENT: Reports sinus pressure and runny nose. No vision changes, No blurry vision, No hearing changes. No mouth, throat, or ear problems.  Respiratory: No cough, No SOB Cardiac: No CP, no palpitations Musculoskeletal: Reports pain to left hip  neuro: No headache, no dizziness, no motor weakness.  Reports numbness and tingling to both feet, mostly at night. Endocrine:  No polydipsia. No polyuria.  Psych: Denies SI/HI  No problems updated.  ALLERGIES: Allergies  Allergen Reactions   Tizanidine Other (See Comments)    May 2022- Reported hallucinations vs dreams w/ sleep walking at home when tizanidine added post-op    Amoxicillin     Yeast infection     PAST MEDICAL HISTORY: Past Medical History:  Diagnosis Date   Anxiety    Arthritis    COPD (chronic obstructive pulmonary disease) (HCC)    Depression    Dyspnea    GERD (gastroesophageal reflux disease)    Hypertension    Peripheral edema    2+ at PAT visit    MEDICATIONS AT HOME: Prior to Admission medications   Medication Sig Start Date End Date Taking? Authorizing Provider  azithromycin (ZITHROMAX) 250 MG tablet Take 2 tablets on day 1, then 1 tablet daily on days 2 through 5 03/07/21 03/12/21 Yes Eleonore Chiquito, FNP  acetaminophen (TYLENOL) 325 MG tablet Take 2 tablets (650 mg total) by  mouth every 6 (six) hours as needed for mild pain (or Fever >/= 101). 01/03/21   Maury Dus, MD  amLODipine (NORVASC) 10 MG tablet Take 1 tablet (10 mg total) by mouth daily. 02/13/21 03/15/21  Rema Fendt, NP  aspirin EC 81 MG tablet Take 1 tablet (81 mg total) by mouth 2 (two) times daily. 12/24/20   Allena Katz, PA-C  atorvastatin (LIPITOR) 40 MG tablet Take 1 tablet (40 mg total) by mouth daily. 12/18/20   Arvilla Market, MD  busPIRone (BUSPAR) 5 MG tablet Take 1 tablet (5 mg total) by mouth 2 (two) times daily. 02/13/21 04/14/21  Rema Fendt, NP  ferrous gluconate (FERGON) 324 MG tablet Take 1 tablet (324 mg total) by mouth daily with lunch. 01/04/21   Maury Dus, MD  fluticasone furoate-vilanterol (BREO ELLIPTA) 100-25 MCG/INH AEPB Inhale 1 puff into the lungs daily. Patient not taking: Reported on 02/13/2021 01/07/21   Arvilla Market, MD  folic acid (FOLVITE) 1 MG tablet TAKE 1 TABLET (1 MG TOTAL) BY MOUTH DAILY. 01/24/21 01/24/22  Rema Fendt, NP  gabapentin (NEURONTIN) 100 MG capsule Take 1 capsule (100 mg total) by mouth at bedtime. 03/07/21 04/06/21  Eleonore Chiquito, FNP  hydrOXYzine (ATARAX/VISTARIL) 50 MG tablet TAKE 1 TABLET (50 MG TOTAL) BY MOUTH 3 (THREE) TIMES DAILY AS NEEDED FOR ANXIETY. Patient taking differently: Take 50 mg by mouth every 8 (eight) hours as needed  for anxiety. 11/05/20   Shanna Cisco, NP  Ketotifen Fumarate (ALLERGY EYE DROPS OP) Place 1 drop into both eyes daily as needed (allergies).    [provider]  losartan (COZAAR) 25 MG tablet Take 50 mg by mouth per day. 03/07/21   Eleonore Chiquito, FNP  Menthol (ICY HOT) 5 % PTCH Apply 1 patch topically daily as needed (hip pain).    [provider]  Multiple Vitamin (MULTIVITAMIN WITH MINERALS) TABS tablet Take 1 tablet by mouth daily.    [provider]  oxybutynin (DITROPAN XL) 15 MG 24 hr tablet Take 1 tablet (15 mg total) by mouth at bedtime. 01/07/21    Arvilla Market, MD  oxyCODONE-acetaminophen (PERCOCET/ROXICET) 5-325 MG tablet Take 1 tablet by mouth every 4 (four) hours as needed for severe pain. 12/24/20   Allena Katz, PA-C  pantoprazole (PROTONIX) 40 MG tablet Take 1 tablet (40 mg total) by mouth 2 (two) times daily. 12/11/20 12/11/21  Arvilla Market, MD  POTASSIUM PO Take 1 tablet by mouth daily.    [provider]  QUEtiapine (SEROQUEL) 300 MG tablet Take 1 tablet (300 mg total) by mouth at bedtime. 02/15/21   Shanna Cisco, NP  sertraline (ZOLOFT) 100 MG tablet Take 1.5 tablets (150 mg total) by mouth daily. 02/13/21 04/14/21  Rema Fendt, NP  tiZANidine (ZANAFLEX) 2 MG tablet Take 2 mg by mouth. 02/08/21   [provider]  traMADol (ULTRAM) 50 MG tablet Take 50 mg by mouth every 6 (six) hours as needed. 02/07/21   [provider]  VITAMIN D PO Take 1 capsule by mouth daily.    [provider]     Objective:  EXAM:   Vitals:   03/07/21 1041  BP: (!) 156/80  Pulse: (!) 109  Resp: 18  Temp: 98.3 F (36.8 C)  SpO2: 93%  Weight: 188 lb (85.3 kg)  Height: 5' 2.01" (1.575 m)    General appearance : A&OX3. NAD. Non-toxic-appearing HEENT: Atraumatic and Normocephalic.  PERRLA. EOM intact.  TM clear B. Mouth: Sinus pressure maxillary area, post pharynx WNL w/o erythema,  Neck: supple, no JVD. No cervical lymphadenopathy. No thyromegaly Chest/Lungs:  Breathing-non-labored, Good air entry bilaterally, breath sounds normal without rales, rhonchi, or wheezing  CVS: S1 S2 regular, no murmurs, gallops, rubs  Abdomen: Bowel sounds present, Non tender and not distended with no gaurding, rigidity or rebound. Extremities: Bilateral Lower Ext shows no edema, both legs are warm to touch with = pulse throughout Neurology:  CN II-XII grossly intact, Non focal.   Psych:  TP linear. J/I WNL. Normal speech. Appropriate eye contact and affect.  Skin: Intact, no rash.  Data  Review Lab Results  Component Value Date   HGBA1C 6.1 (A) 03/07/2021   HGBA1C 6.5 (H) 12/25/2020   HGBA1C 6.6 (H) 12/11/2020     Assessment & Plan   1. Diabetes mellitus without complication (HCC) -Continue to monitor blood sugar as ordered.  Report blood sugar less than 80 or over 249 to the clinic or local ER. -Take medications as ordered. - POCT glycosylated hemoglobin (Hb A1C) - POCT glucose (manual entry)  2. Hereditary and idiopathic peripheral neuropathy - gabapentin (NEURONTIN) 100 MG capsule; Take 1 capsule (100 mg total) by mouth at bedtime.  Dispense: 30 capsule; Refill: 0  3. Essential hypertension -Follow-up in a month for blood pressure check, with primary care provider. - losartan (COZAAR) 25 MG tablet; Take 50 mg by mouth per day.  Dispense: 30 tablet; Refill: 0  4. Acute maxillary sinusitis, recurrence not specified -Take Tylenol as needed for pain.  Dehydrated. - azithromycin (ZITHROMAX) 250 MG tablet; Take 2 tablets on day 1, then 1 tablet daily on days 2 through 5  Dispense: 6 tablet; Refill: 0  5. Left hip pain -Keep the x-ray appointment as ordered - gabapentin (NEURONTIN) 100 MG capsule; Take 1 capsule (100 mg total) by mouth at bedtime.  Dispense: 30 capsule; Refill: 0  6. Finger numbness -Report new or worsening symptoms to the clinic or local ER - gabapentin (NEURONTIN) 100 MG capsule; Take 1 capsule (100 mg total) by mouth at bedtime.  Dispense: 30 capsule; Refill: 0     Patient have been counseled extensively about nutrition and exercise  No follow-ups on file.  The patient was given clear instructions to go to ER or return to medical center if symptoms don't improve, worsen or new problems develop. The patient verbalized understanding. The patient was told to call to get lab results if they haven't heard anything in the next week.     Eleonore Chiquito, APRN, FNP-C [facility],[place], [place] [phone]    03/07/2021, 11:38 AM

## 2021-03-08 ENCOUNTER — Telehealth: Payer: Self-pay | Admitting: Family

## 2021-03-08 DIAGNOSIS — G609 Hereditary and idiopathic neuropathy, unspecified: Secondary | ICD-10-CM

## 2021-03-08 DIAGNOSIS — R200 Anesthesia of skin: Secondary | ICD-10-CM

## 2021-03-08 DIAGNOSIS — M25552 Pain in left hip: Secondary | ICD-10-CM

## 2021-03-08 NOTE — Telephone Encounter (Signed)
1) Medication(s) Requested (by name): fluticasone furoate-vilanterol (BREO ELLIPTA) 100-25 MCG/INH AEPB [  2) Pharmacy of Choice: [facility]  [address]    3) Special Requests:   Approved medications will be sent to the pharmacy, we will reach out if there is an issue.  Requests made after 3pm may not be addressed until the following business day!  If a patient is unsure of the name of the medication(s) please note and ask patient to call back when they are able to provide all info, do not send to responsible party until all information is available!

## 2021-03-11 ENCOUNTER — Other Ambulatory Visit: Payer: Self-pay | Admitting: Family

## 2021-03-11 DIAGNOSIS — I10 Essential (primary) hypertension: Secondary | ICD-10-CM

## 2021-03-11 MED ORDER — GABAPENTIN 100 MG PO CAPS: 200.0000 mg | ORAL_CAPSULE | Freq: Every day | ORAL | 0 refills | Status: DC

## 2021-03-11 NOTE — Telephone Encounter (Signed)
Amlodipine 10 mg is the maximum dose.  I have sent a prescription for increased dose of gabapentin.  PCP had prescribed Breo for several refills for her.  If she is requesting something else that will need to be addressed at an office visit.

## 2021-03-11 NOTE — Telephone Encounter (Signed)
Patient states she would like to have Trelegy called in instead of the Breo Ellipta to the [facility] on [address].   Patient stated she was told to take 2 gabapentin (NEURONTIN) 100 MG capsule  at night and the prescription still states take 1.  Patient needs refill and stated she was also told to take 2 amLODipine (NORVASC) 10 MG tablet and the prescription still says 1. Patient needs refill called in to [facility] on [address]

## 2021-03-13 NOTE — Telephone Encounter (Signed)
Patient states she has picked up her Rx.

## 2021-03-15 ENCOUNTER — Other Ambulatory Visit: Payer: Self-pay | Admitting: Family

## 2021-03-20 ENCOUNTER — Ambulatory Visit (INDEPENDENT_AMBULATORY_CARE_PROVIDER_SITE_OTHER): Payer: Medicaid Other | Admitting: Clinical

## 2021-03-20 ENCOUNTER — Other Ambulatory Visit: Payer: Self-pay

## 2021-03-20 DIAGNOSIS — F3341 Major depressive disorder, recurrent, in partial remission: Secondary | ICD-10-CM

## 2021-03-20 NOTE — Progress Notes (Signed)
   THERAPIST PROGRESS NOTE  Session Time: 30 minutes  Participation Level: Active  Behavioral Response: CasualAlertDepressed  Type of Therapy: Individual Therapy  Treatment Goals addressed: Coping  Interventions: CBT and Supportive  Summary:  Myeasha Ballowe is a 60 y.o. female who presents to session oriented x5, appropriately dressed, and friendly.  Client denied hallucinations and delusions. Client reported on today she has been recovering from surgery and being physically sick.  Client reported in May 2022 she had hip surgery in May 2022 which left her with lasting symptoms and she was given oxygen to use.  Client reported she did not go to [place] like she plans to live with her boyfriend due to her health.  Client reported she continues to stay with her parents.  Client reported she has applied for housing for seniors.  Client reported living with her parents are stressful and causes her anxiety mainly because of her mother.  Client reported otherwise her anxiety medication is working well and she is sleeping good.     Suicidal/Homicidal: Nowithout intent/plan  Therapist Response:  Therapist began the session asking the client how she has been doing since last seen. Therapist used CBT for active listening and positive emotional support towards her thoughts and feelings. Therapist used CBT to engage the client to ask open-ended questions about how she has been coping with stressors. Therapist used CBT to ask client about medication compliance compared her ongoing symptoms. Therapist assigned client homework to continue with medication client and practice stress relieving activities. Client was scheduled for next appointment.    Plan: Return again in 5 weeks.  Diagnosis: MDD, recurrent, in partial remission   Neena Rhymes Jermarion Poffenberger, LCSW 03/20/2021

## 2021-03-22 ENCOUNTER — Ambulatory Visit (HOSPITAL_COMMUNITY): Payer: Medicaid Other | Admitting: Clinical

## 2021-03-27 ENCOUNTER — Encounter (HOSPITAL_COMMUNITY): Payer: Self-pay | Admitting: Psychiatry

## 2021-03-27 ENCOUNTER — Other Ambulatory Visit: Payer: Self-pay

## 2021-03-27 ENCOUNTER — Telehealth (INDEPENDENT_AMBULATORY_CARE_PROVIDER_SITE_OTHER): Payer: Medicaid Other | Admitting: Psychiatry

## 2021-03-27 DIAGNOSIS — F411 Generalized anxiety disorder: Secondary | ICD-10-CM | POA: Diagnosis not present

## 2021-03-27 DIAGNOSIS — F419 Anxiety disorder, unspecified: Secondary | ICD-10-CM | POA: Diagnosis not present

## 2021-03-27 MED ORDER — QUETIAPINE FUMARATE 300 MG PO TABS: 300.0000 mg | ORAL_TABLET | Freq: Every day | ORAL | 3 refills | Status: DC

## 2021-03-27 MED ORDER — SERTRALINE HCL 100 MG PO TABS: 150.0000 mg | ORAL_TABLET | Freq: Every day | ORAL | 3 refills | Status: DC

## 2021-03-27 MED ORDER — BUSPIRONE HCL 5 MG PO TABS: 5.0000 mg | ORAL_TABLET | Freq: Two times a day (BID) | ORAL | 3 refills | Status: DC

## 2021-03-27 MED ORDER — HYDROXYZINE HCL 50 MG PO TABS: ORAL_TABLET | ORAL | 3 refills | Status: DC

## 2021-03-27 NOTE — Progress Notes (Signed)
BH MD/PA/NP OP Progress Note Virtual Visit via Telephone Note  I connected with [name] on 03/27/21 at  3:30 PM EDT by telephone and verified that I am speaking with the correct person using two identifiers.  Location: Patient: home Provider: Clinic   I discussed the limitations, risks, security and privacy concerns of performing an evaluation and management service by telephone and the availability of in person appointments. I also discussed with the patient that there may be a patient responsible charge related to this service. The patient expressed understanding and agreed to proceed.   I provided 30 minutes of non-face-to-face time during this encounter.    03/27/2021 3:57 PM [name]  MRN:  [id number]  Chief Complaint: "I am doing okay but I would like to have my own place."  HPI:  60 year old female seen today for follow-up psychiatric evaluation.  She has a psychiatric history MDD, severe alcohol use disorder now in remission.  She is currently managed on Zoloft 100 mg daily, Seroquel 300 mg nightly, and hydroxyzine 50 mg 3 times daily as needed.  Today she notes her medications are effective in managing her psychiatric conditions.   Today patient logged in virtually however her volume was not working.  The remainder of her exam was done over the phone.  During exam she was pleasant, cooperative, and engaged in conversation.  She notes that she finds her medications effective and notes that overall she is doing well.  She however informed Clinical research associate that she continues to live with her parents and would like to find a place of her own.  She notes that she applied for some senior housing and 2021 however notes that she has not received a call back.  Provider referred patient to the care management team for further assistance which she was grateful for.    Patient informed writer that recently she had surgery on her left hip.  She notes that she has some pain but reports that she is  recovering well.  She does Archivist that her feet and legs are bothersome.  She quantifies her pain as 10 out of 10.  She notes that she is followed by her PCP to address these concerns.    Patient informed Clinical research associate that her anxiety and depression continues to be minimal.  Provider conducted a GAD-7 and patient scored an 11, at her last visit she scored a 12.  Provider also conducted a PHQ-9 and patient scored a 10, at her last visit she scored a 5.  She endorses adequate sleep and appetite.  Today she denies SI/HI/VAH, mania, or paranoia.     No medication changes made.  Patient agreeable to continue medications as prescribed.  She will follow up with outpatient counseling for therapy.  No other concerns noted at this time. Visit Diagnosis:    ICD-10-CM   1. Generalized anxiety disorder  F41.1 busPIRone (BUSPAR) 5 MG tablet    sertraline (ZOLOFT) 100 MG tablet    2. Anxiety  F41.9 hydrOXYzine (ATARAX/VISTARIL) 50 MG tablet      Past Psychiatric History: MDD, severe alcohol use disorder now in remission  Past Medical History:  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   Procedure: RIGHT TOTAL HIP ARTHROPLASTY ANTERIOR APPROACH;  Surgeon: Gean Birchwood, MD;  Location: [facility];  Service: Orthopedics;  Laterality: Right;    Family Psychiatric History: denied  Family History:  Family History  Problem Relation Age of Onset   Ulcers Father    Hypotension Sister    Hypertension Brother     Social History:  Social History   Socioeconomic History   Marital status: Single     Spouse name: Not on file   Number of children: 0   Years of education: 12   Highest education level: GED or equivalent  Occupational History   Occupation: Armed forces operational officer  Tobacco Use   Smoking status: Every Day    Packs/day: 1.00    Years: 25.00    Pack years: 25.00    Types: Cigarettes    Start date: 08/04/1978   Smokeless tobacco: Never  Vaping Use   Vaping Use: Never used  Substance and Sexual Activity   Alcohol use: Not Currently   Drug use: Never   Sexual activity: Never  Other Topics Concern   Not on file  Social History Narrative   Lives and step-dad, they are able to help.   Social Determinants of Health   Financial Resource Strain: Not on file  Food Insecurity: Not on file  Transportation Needs: Not on file  Physical Activity: Not on file  Stress: Not on file  Social Connections: Not on file    Allergies:  Allergies  Allergen Reactions   Tizanidine Other (See Comments)    May 2022- Reported hallucinations vs dreams w/ sleep walking at home when tizanidine added post-op    Amoxicillin     Yeast infection     Metabolic Disorder Labs: Lab Results  Component Value Date   HGBA1C 6.1 (A) 03/07/2021   MPG 140 12/25/2020   MPG 99.67 11/04/2019   No results found for: PROLACTIN Lab Results  Component Value Date   CHOL 234 (H) 12/11/2020   TRIG 245 (H) 12/11/2020   HDL 55 12/11/2020   CHOLHDL 4.3 12/11/2020   LDLCALC 135 (H) 12/11/2020   Lab Results  Component Value Date   TSH 2.370 05/22/2020    Therapeutic Level Labs: No results found for: LITHIUM No results found for: VALPROATE No components found for:  CBMZ  Current Medications: Current Outpatient Medications  Medication Sig Dispense Refill   acetaminophen (TYLENOL) 325 MG tablet Take 2 tablets (650 mg total) by mouth every 6 (six) hours as needed for mild pain (or Fever >/= 101).     amLODipine (NORVASC) 10 MG tablet TAKE 1 TABLET(10 MG) BY MOUTH DAILY 90 tablet 0   aspirin EC 81 MG  tablet Take 1 tablet (81 mg total) by mouth 2 (two) times daily. 60 tablet 0   atorvastatin (LIPITOR) 40 MG tablet Take 1 tablet (40 mg total) by mouth daily. 90 tablet 3   busPIRone (BUSPAR) 5 MG tablet Take 1 tablet (5 mg total) by mouth 2 (two) times daily. 60 tablet 3   ferrous gluconate (FERGON) 324 MG tablet Take 1 tablet (324 mg total) by mouth daily with lunch. 30 tablet 0   fluticasone furoate-vilanterol (BREO ELLIPTA) 100-25 MCG/INH AEPB Inhale 1 puff into the lungs daily. (Patient not taking: Reported on 02/13/2021) 60 each 11  folic acid (FOLVITE) 1 MG tablet TAKE 1 TABLET (1 MG TOTAL) BY MOUTH DAILY. 90 tablet 0   gabapentin (NEURONTIN) 100 MG capsule Take 2 capsules (200 mg total) by mouth at bedtime. 60 capsule 0   hydrOXYzine (ATARAX/VISTARIL) 50 MG tablet TAKE 1 TABLET (50 MG TOTAL) BY MOUTH 3 (THREE) TIMES DAILY AS NEEDED FOR ANXIETY. 90 tablet 3   Ketotifen Fumarate (ALLERGY EYE DROPS OP) Place 1 drop into both eyes daily as needed (allergies).     losartan (COZAAR) 25 MG tablet Take 50 mg by mouth per day. 30 tablet 0   Menthol (ICY HOT) 5 % PTCH Apply 1 patch topically daily as needed (hip pain).     Multiple Vitamin (MULTIVITAMIN WITH MINERALS) TABS tablet Take 1 tablet by mouth daily.     oxybutynin (DITROPAN XL) 15 MG 24 hr tablet Take 1 tablet (15 mg total) by mouth at bedtime. 90 tablet 0   oxyCODONE-acetaminophen (PERCOCET/ROXICET) 5-325 MG tablet Take 1 tablet by mouth every 4 (four) hours as needed for severe pain. 30 tablet 0   pantoprazole (PROTONIX) 40 MG tablet Take 1 tablet (40 mg total) by mouth 2 (two) times daily. 60 tablet 5   POTASSIUM PO Take 1 tablet by mouth daily.     QUEtiapine (SEROQUEL) 300 MG tablet Take 1 tablet (300 mg total) by mouth at bedtime. 30 tablet 3   sertraline (ZOLOFT) 100 MG tablet Take 1.5 tablets (150 mg total) by mouth daily. 45 tablet 3   tiZANidine (ZANAFLEX) 2 MG tablet Take 2 mg by mouth.     VITAMIN D PO Take 1 capsule by mouth  daily.     No current facility-administered medications for this visit.     Musculoskeletal: Strength & Muscle Tone:  Unable to assess due to telephone/telehealth visit Gait & Station:  Unable to assess due to telephone/telehealth visit Patient leans: N/A  Psychiatric Specialty Exam: Review of Systems  There were no vitals taken for this visit.There is no height or weight on file to calculate BMI.  General Appearance: Well Groomed  Eye Contact:  Good  Speech:  Clear and Coherent and Normal Rate  Volume:  Normal  Mood:  Anxious and Depressed  Affect:  Appropriate and Congruent  Thought Process:  Coherent, Goal Directed, and Linear  Orientation:  Full (Time, Place, and Person)  Thought Content: WDL and Logical   Suicidal Thoughts:  No  Homicidal Thoughts:  No  Memory:  Immediate;   Good Recent;   Good Remote;   Good  Judgement:  Good  Insight:  Good  Psychomotor Activity:  Normal  Concentration:  Concentration: Good and Attention Span: Good  Recall:  Good  Fund of Knowledge: Good  Language: Good  Akathisia:  No  Handed:  Right  AIMS (if indicated): not done  Assets:  Communication Skills Desire for Improvement Financial Resources/Insurance Housing Leisure Time Physical Health Social Support  ADL's:  Intact  Cognition: WNL  Sleep:  Good   Screenings: GAD-7    Flowsheet Row [facility]  Total GAD-7 Score 11 6 13 12 18       PHQ2-9    Flowsheet Row Video Visit [facility]20[facility]  Visit from 03/07/2021 in Primary Care at [facility] Visit from 02/13/2021 in Primary Care at [facility] from 01/07/2021 in Primary Care at [facility] Visit from 12/11/2020 in Primary Care at [facility] Total Score 6 0 0 0 6  PHQ-9 Total Score 10 -- 0 0 20      Flowsheet Row Admission (Discharged) from 12/24/2020 in [facility] Pre-Admission Testing 59 from 12/17/2020 in [facility]  C-SSRS RISK CATEGORY No Risk No Risk        Assessment and Plan: Patient notes that she is doing well on her current medication regimen.  No medication changes made today.  Patient agreeable to continue medications as prescribed.  1. Generalized anxiety disorder  Continue- busPIRone (BUSPAR) 5 MG tablet; Take 1 tablet (5 mg total) by mouth 2 (two) times daily.  Dispense: 60 tablet; Refill: 3 Continue- sertraline (ZOLOFT) 100 MG tablet; Take 1.5 tablets (150 mg total) by mouth daily.  Dispense: 45 tablet; Refill: 3  2. Anxiety  Continue- hydrOXYzine (ATARAX/VISTARIL) 50 MG tablet; TAKE 1 TABLET (50 MG TOTAL) BY MOUTH 3 (THREE) TIMES DAILY AS NEEDED FOR ANXIETY.  Dispense: 90 tablet; Refill: 3  Follow-up in 3 months Follow-up with therapy  Shanna Cisco, NP 03/27/2021, 3:57 PM

## 2021-03-28 ENCOUNTER — Telehealth (HOSPITAL_COMMUNITY): Payer: Self-pay | Admitting: Internal Medicine

## 2021-03-28 NOTE — BH Assessment (Signed)
Care Management -  Outpatient Services    Patient reports that she is currently living with her mother and she receives disability benefits.  Patient reports that she does not want to live in a house and she wants to  be able to rent an apartment.     Clinical research associate provided referral to affordable senior living in [place], [place]  [facility] [place]  [phone]  [facility]  [phone]  [facility]  [phone] Phase II [phone]  [facility]  [phone] of [place]  [phone]  [place] [phone]

## 2021-04-01 ENCOUNTER — Other Ambulatory Visit: Payer: Self-pay | Admitting: Family

## 2021-04-01 DIAGNOSIS — I10 Essential (primary) hypertension: Secondary | ICD-10-CM

## 2021-04-01 NOTE — Telephone Encounter (Signed)
Routing to PCE. 

## 2021-04-03 ENCOUNTER — Other Ambulatory Visit: Payer: Self-pay

## 2021-04-04 ENCOUNTER — Encounter: Payer: Self-pay | Admitting: Family Medicine

## 2021-04-04 ENCOUNTER — Other Ambulatory Visit: Payer: Self-pay

## 2021-04-04 ENCOUNTER — Ambulatory Visit (INDEPENDENT_AMBULATORY_CARE_PROVIDER_SITE_OTHER): Payer: Medicaid Other | Admitting: Family Medicine

## 2021-04-04 VITALS — BP 157/82 | HR 102 | Temp 98.3°F | Resp 16 | Wt 195.6 lb

## 2021-04-04 DIAGNOSIS — G629 Polyneuropathy, unspecified: Secondary | ICD-10-CM | POA: Diagnosis not present

## 2021-04-04 DIAGNOSIS — I10 Essential (primary) hypertension: Secondary | ICD-10-CM

## 2021-04-04 DIAGNOSIS — E785 Hyperlipidemia, unspecified: Secondary | ICD-10-CM

## 2021-04-04 DIAGNOSIS — J42 Unspecified chronic bronchitis: Secondary | ICD-10-CM | POA: Diagnosis not present

## 2021-04-04 MED ORDER — LOSARTAN POTASSIUM 50 MG PO TABS: 50.0000 mg | ORAL_TABLET | Freq: Every day | ORAL | 0 refills | Status: DC

## 2021-04-04 MED ORDER — GABAPENTIN 300 MG PO CAPS: 300.0000 mg | ORAL_CAPSULE | Freq: Three times a day (TID) | ORAL | 1 refills | Status: DC

## 2021-04-04 MED ORDER — BUDESONIDE-FORMOTEROL FUMARATE 80-4.5 MCG/ACT IN AERO: 2.0000 | INHALATION_SPRAY | Freq: Two times a day (BID) | RESPIRATORY_TRACT | 1 refills | Status: DC

## 2021-04-04 NOTE — Progress Notes (Signed)
Patient is here for BP recheck. Patient is having leg, hip , and finger pain. Patient is having numbness in fingers. Patient would like x-ray of left hip Patient would like refills

## 2021-04-04 NOTE — Progress Notes (Signed)
Established Patient Office Visit  Subjective:  Patient ID: [name], female    DOB: 1961-06-27  Age: 60 y.o. MRN: [id number]  CC:  Chief Complaint  Patient presents with   Hypertension   Numbness    HPI [name] presents for routine follow up of chronic med issues including diabetes and hypertension. Patient reports that she has persistent intermittent numbness.  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   Procedure: RIGHT TOTAL HIP ARTHROPLASTY ANTERIOR APPROACH;  Surgeon: Gean Birchwood, MD;  Location: [facility];  Service: Orthopedics;  Laterality: Right;    Family History  Problem Relation Age of Onset   Ulcers Father    Hypotension Sister    Hypertension Brother     Social History   Socioeconomic History   Marital status: Single    Spouse name: Not on file   Number of children: 0   Years of education: 12   Highest education level: GED or equivalent  Occupational History   Occupation: Armed forces operational officer  Tobacco Use   Smoking status: Every Day    Packs/day: 1.00    Years: 25.00    Pack years: 25.00    Types: Cigarettes    Start date: 08/04/1978   Smokeless tobacco: Never  Vaping Use   Vaping Use: Never used  Substance and Sexual Activity   Alcohol use: Not Currently   Drug use: Never   Sexual activity: Never  Other Topics Concern   Not on file  Social History Narrative   Lives and step-dad, they are able to help.   Social Determinants of Health   Financial Resource Strain: Not on file  Food Insecurity: Not on file  Transportation Needs: Not  on file  Physical Activity: Not on file  Stress: Not on file  Social Connections: Not on file  Intimate Partner Violence: Not on file    Outpatient Medications Prior to Visit  Medication Sig Dispense Refill   acetaminophen (TYLENOL) 325 MG tablet Take 2 tablets (650 mg total) by mouth every 6 (six) hours as needed for mild pain (or Fever >/= 101).     amLODipine (NORVASC) 10 MG tablet TAKE 1 TABLET(10 MG) BY MOUTH DAILY 90 tablet 0   aspirin EC 81 MG tablet Take 1 tablet (81 mg total) by mouth 2 (two) times daily. 60 tablet 0   atorvastatin (LIPITOR) 40 MG tablet Take 1 tablet (40 mg total) by mouth daily. 90 tablet 3   busPIRone (BUSPAR) 5 MG tablet Take 1 tablet (5 mg total) by mouth 2 (two) times daily. 60 tablet 3   ferrous gluconate (FERGON) 324 MG tablet Take 1 tablet (324 mg total) by mouth daily with lunch. 30 tablet 0   fluticasone furoate-vilanterol (BREO ELLIPTA) 100-25 MCG/INH AEPB Inhale 1 puff into the lungs daily. 60 each 11   folic acid (FOLVITE) 1 MG tablet TAKE 1 TABLET (1 MG TOTAL) BY MOUTH DAILY. 90 tablet 0   gabapentin (NEURONTIN) 100 MG capsule Take 2 capsules (200  mg total) by mouth at bedtime. 60 capsule 0   hydrOXYzine (ATARAX/VISTARIL) 50 MG tablet TAKE 1 TABLET (50 MG TOTAL) BY MOUTH 3 (THREE) TIMES DAILY AS NEEDED FOR ANXIETY. 90 tablet 3   Ketotifen Fumarate (ALLERGY EYE DROPS OP) Place 1 drop into both eyes daily as needed (allergies).     losartan (COZAAR) 25 MG tablet Take 50 mg by mouth per day. 30 tablet 0   Menthol (ICY HOT) 5 % PTCH Apply 1 patch topically daily as needed (hip pain).     Multiple Vitamin (MULTIVITAMIN WITH MINERALS) TABS tablet Take 1 tablet by mouth daily.     oxybutynin (DITROPAN XL) 15 MG 24 hr tablet Take 1 tablet (15 mg total) by mouth at bedtime. 90 tablet 0   oxyCODONE-acetaminophen (PERCOCET/ROXICET) 5-325 MG tablet Take 1 tablet by mouth every 4 (four) hours as needed for severe pain. 30 tablet 0   pantoprazole (PROTONIX) 40 MG  tablet Take 1 tablet (40 mg total) by mouth 2 (two) times daily. 60 tablet 5   POTASSIUM PO Take 1 tablet by mouth daily.     QUEtiapine (SEROQUEL) 300 MG tablet Take 1 tablet (300 mg total) by mouth at bedtime. 30 tablet 3   sertraline (ZOLOFT) 100 MG tablet Take 1.5 tablets (150 mg total) by mouth daily. 45 tablet 3   tiZANidine (ZANAFLEX) 2 MG tablet Take 2 mg by mouth.     VITAMIN D PO Take 1 capsule by mouth daily.     No facility-administered medications prior to visit.    Allergies  Allergen Reactions   Tizanidine Other (See Comments)    May 2022- Reported hallucinations vs dreams w/ sleep walking at home when tizanidine added post-op    Amoxicillin     Yeast infection     ROS Review of Systems  Neurological:  Positive for numbness.  All other systems reviewed and are negative.    Objective:    Physical Exam Vitals and nursing note reviewed.  Constitutional:      General: She is not in acute distress.    Appearance: She is obese.  Cardiovascular:     Rate and Rhythm: Normal rate and regular rhythm.  Pulmonary:     Effort: Pulmonary effort is normal.     Breath sounds: Normal breath sounds.  Abdominal:     Palpations: Abdomen is soft.     Tenderness: There is no abdominal tenderness.  Musculoskeletal:     Right lower leg: No edema.     Left lower leg: No edema.  Neurological:     General: No focal deficit present.     Mental Status: She is alert and oriented to person, place, and time.    BP (!) 157/82 (BP Location: Right Arm, Patient Position: Sitting, Cuff Size: Large)   Pulse (!) 102   Temp 98.3 F (36.8 C) (Oral)   Resp 16   Wt 195 lb 9.6 oz (88.7 kg)   SpO2 95%   BMI 35.77 kg/m  Wt Readings from Last 3 Encounters:  04/04/21 195 lb 9.6 oz (88.7 kg)  03/07/21 188 lb (85.3 kg)  02/13/21 194 lb (88 kg)     Health Maintenance Due  Topic Date Due   PNEUMOCOCCAL POLYSACCHARIDE VACCINE AGE 86-64 HIGH RISK  Never done   FOOT EXAM  Never done    OPHTHALMOLOGY EXAM  Never done   PAP SMEAR-Modifier  Never done   MAMMOGRAM  Never done   Zoster Vaccines- Shingrix (1 of 2)  Never done    There are no preventive care reminders to display for this patient.    Lab Results  Component Value Date   HGBA1C 6.1 (A) 03/07/2021      Assessment & Plan:   1. Uncontrolled hypertension Increased losartan form 25 mg daily to 50 mg daily - will monitor - losartan (COZAAR) 50 MG tablet; Take 1 tablet (50 mg total) by mouth daily.  Dispense: 90 tablet; Refill: 0  2. Neuropathy Increased neurontin to 300 mg tid - gabapentin (NEURONTIN) 300 MG capsule; Take 1 capsule (300 mg total) by mouth 3 (three) times daily.  Dispense: 90 capsule; Refill: 1  3. Hyperlipidemia, unspecified hyperlipidemia type Continue present management  4. Chronic bronchitis, unspecified chronic bronchitis type (HCC) Symbicort refilled - compliance discussed - budesonide-formoterol (SYMBICORT) 80-4.5 MCG/ACT inhaler; Inhale 2 puffs into the lungs 2 (two) times daily.  Dispense: 1 each; Refill: 1   No orders of the defined types were placed in this encounter.   Follow-up: Return in about 4 weeks (around 05/02/2021) for follow up.    Tommie Raymond, MD

## 2021-04-06 ENCOUNTER — Other Ambulatory Visit: Payer: Self-pay | Admitting: Family Medicine

## 2021-04-06 DIAGNOSIS — I10 Essential (primary) hypertension: Secondary | ICD-10-CM

## 2021-04-11 ENCOUNTER — Other Ambulatory Visit: Payer: Self-pay

## 2021-04-11 ENCOUNTER — Ambulatory Visit (HOSPITAL_BASED_OUTPATIENT_CLINIC_OR_DEPARTMENT_OTHER): Payer: Medicaid Other | Attending: Internal Medicine | Admitting: Internal Medicine

## 2021-04-11 DIAGNOSIS — G4736 Sleep related hypoventilation in conditions classified elsewhere: Secondary | ICD-10-CM | POA: Insufficient documentation

## 2021-04-11 DIAGNOSIS — R0683 Snoring: Secondary | ICD-10-CM | POA: Diagnosis present

## 2021-04-11 DIAGNOSIS — R0902 Hypoxemia: Secondary | ICD-10-CM | POA: Diagnosis not present

## 2021-04-11 MED ORDER — LOSARTAN POTASSIUM 50 MG PO TABS: 50.0000 mg | ORAL_TABLET | Freq: Every day | ORAL | 0 refills | Status: DC

## 2021-04-14 DIAGNOSIS — R0683 Snoring: Secondary | ICD-10-CM

## 2021-04-14 NOTE — Procedures (Signed)
    Patient Name: Julie, Simon Date: 04/11/2021 Gender: Female D.O.B: 04-16-1961 Age (years): 59 Referring Provider: De Hollingshead DO Height (inches): 60 Interpreting Physician: Jetty Duhamel MD, ABSM Weight (lbs): 188 RPSGT: Cherylann Parr BMI: 37 MRN: [id number] Neck Size: 15.00  CLINICAL INFORMATION Sleep Study Type: NPSG Indication for sleep study: Obesity, Snoring, Witnesses Apnea / Gasping During Sleep Epworth Sleepiness Score: 0  SLEEP STUDY TECHNIQUE As per the AASM Manual for the Scoring of Sleep and Associated Events v2.3 (April 2016) with a hypopnea requiring 4% desaturations.  The channels recorded and monitored were frontal, central and occipital EEG, electrooculogram (EOG), submentalis EMG (chin), nasal and oral airflow, thoracic and abdominal wall motion, anterior tibialis EMG, snore microphone, electrocardiogram, and pulse oximetry.  MEDICATIONS Medications self-administered by patient taken the night of the study : AMLODIPINE, GABAPENTIN, LOSARTAN, TIZANIDINE  SLEEP ARCHITECTURE The study was initiated at 10:08:44 PM and ended at 4:26:02 AM.  Sleep onset time was 74.4 minutes and the sleep efficiency was 73.0%%. The total sleep time was 275.5 minutes.  Stage REM latency was N/A minutes.  The patient spent 1.8%% of the night in stage N1 sleep, 98.2%% in stage N2 sleep, 0.0%% in stage N3 and 0% in REM.  Alpha intrusion was absent.  Supine sleep was 100.00%.  RESPIRATORY PARAMETERS The overall apnea/hypopnea index (AHI) was 3.7 per hour. There were 10 total apneas, including 10 obstructive, 0 central and 0 mixed apneas. There were 7 hypopneas and 13 RERAs.  The AHI during Stage REM sleep was N/A per hour.  AHI while supine was 3.7 per hour.  The mean oxygen saturation was 92.5%. The minimum SpO2 during sleep was 82.0%.  moderate snoring was noted during this study.  CARDIAC DATA The 2 lead EKG demonstrated sinus rhythm. The mean heart rate  was 96.4 beats per minute. Other EKG findings include: None.  LEG MOVEMENT DATA The total PLMS were 0 with a resulting PLMS index of 0.0. Associated arousal with leg movement index was 0.0 .  IMPRESSIONS - No significant obstructive sleep apnea occurred during this study (AHI = 3.7/h). - Oxygen desaturation was noted during this study (Min O2 = 82.0%). Due to sustained O2 saturation - The patient snored with moderate snoring volume. - No cardiac abnormalities were noted during this study. Sinus tachycardia noted prior to addition of O2. - Clinically significant periodic limb movements did not occur during sleep. No significant associated arousals.  DIAGNOSIS - Nocturnal Hypoxemia (G47.36)  RECOMMENDATIONS - Supplemental O2 2L during sleep. - Sleep hygiene should be reviewed to assess factors that may improve sleep quality. - Weight management and regular exercise should be initiated or continued if appropriate.  [Electronically signed] 04/14/2021 12:00 PM  Jetty Duhamel MD, ABSM Diplomate, American Board of Sleep Medicine   NPI: [id number]                         Jetty Duhamel Diplomate, American Board of Sleep Medicine  ELECTRONICALLY SIGNED ON:  04/14/2021, 11:55 AM [facility] PH: [phone]   FX: [phone] ACCREDITED BY THE AMERICAN ACADEMY OF SLEEP MEDICINE

## 2021-04-16 ENCOUNTER — Ambulatory Visit
Admission: EM | Admit: 2021-04-16 | Discharge: 2021-04-16 | Disposition: A | Discharge status: Home / Self Care | Payer: Medicaid Other | Attending: Urgent Care | Admitting: Urgent Care

## 2021-04-16 ENCOUNTER — Other Ambulatory Visit: Payer: Self-pay

## 2021-04-16 DIAGNOSIS — I10 Essential (primary) hypertension: Secondary | ICD-10-CM

## 2021-04-16 DIAGNOSIS — R040 Epistaxis: Secondary | ICD-10-CM | POA: Diagnosis not present

## 2021-04-16 MED ORDER — OXYMETAZOLINE HCL 0.05 % NA SOLN
1.0000 | Freq: Two times a day (BID) | NASAL | Status: DC
  Administered 2021-04-16: 1 via NASAL

## 2021-04-16 NOTE — ED Triage Notes (Signed)
Pt c/o nosebleed x1hr. States hx of same and last one was 3 days ago lasting . States has had the rt nare cauterized in the past.

## 2021-04-16 NOTE — Discharge Instructions (Addendum)
We gave you 1 spray of Afrin to help prevent the nosebleeds from happening again.  You can use this 1 more time again tonight.  Please make sure that you follow-up with your ear nose throat specialist as soon as possible.  Regarding your blood pressure, I do not see any signs of stroke and I know you are working closely with your regular doctor on getting better control of this.  Make sure you follow-up with him as well for a recheck on your blood pressure and medication adjustments as necessary.  If you develop severe headache, confusion, dizziness, weakness on one side of body or severe nosebleed that is not alleviated by the use of Afrin and 10 minutes of continuous firm pressure to the nose then please report to the emergency room.

## 2021-04-16 NOTE — ED Provider Notes (Signed)
[facility]   MRN: [id number] DOB: 1960-10-19  Subjective:   [name] is a 60 y.o. female presenting for acute onset today of a persistent nosebleed. Symptoms started while she was watching TV and was laughing. She has since developed a slight frontal headache.  Has a history of nosebleeds, has had to have cautery before.  She does have an ENT specialist.  However she has not followed up with them in months.  She also has a PCP that is helping her manage her blood pressure.  She has an appointment again in a month.  Denies any particular confusion, weakness, numbness or tingling, chest pain, shortness of breath, heart racing, nausea, vomiting, abdominal pain, hematuria.  Patient is taking a baby aspirin daily.  No history of MI, heart disease, stroke.  No current facility-administered medications for this encounter.  Current Outpatient Medications:    acetaminophen (TYLENOL) 325 MG tablet, Take 2 tablets (650 mg total) by mouth every 6 (six) hours as needed for mild pain (or Fever >/= 101)., Disp: , Rfl:    amLODipine (NORVASC) 10 MG tablet, TAKE 1 TABLET(10 MG) BY MOUTH DAILY, Disp: 90 tablet, Rfl: 0   aspirin EC 81 MG tablet, Take 1 tablet (81 mg total) by mouth 2 (two) times daily., Disp: 60 tablet, Rfl: 0   atorvastatin (LIPITOR) 40 MG tablet, Take 1 tablet (40 mg total) by mouth daily., Disp: 90 tablet, Rfl: 3   budesonide-formoterol (SYMBICORT) 80-4.5 MCG/ACT inhaler, Inhale 2 puffs into the lungs 2 (two) times daily., Disp: 1 each, Rfl: 1   busPIRone (BUSPAR) 5 MG tablet, Take 1 tablet (5 mg total) by mouth 2 (two) times daily., Disp: 60 tablet, Rfl: 3   ferrous gluconate (FERGON) 324 MG tablet, Take 1 tablet (324 mg total) by mouth daily with lunch., Disp: 30 tablet, Rfl: 0   fluticasone furoate-vilanterol (BREO ELLIPTA) 100-25 MCG/INH AEPB, Inhale 1 puff into the lungs daily., Disp: 60 each, Rfl: 11   folic acid (FOLVITE) 1 MG tablet, TAKE 1 TABLET (1 MG TOTAL) BY  MOUTH DAILY., Disp: 90 tablet, Rfl: 0   gabapentin (NEURONTIN) 300 MG capsule, Take 1 capsule (300 mg total) by mouth 3 (three) times daily., Disp: 90 capsule, Rfl: 1   hydrOXYzine (ATARAX/VISTARIL) 50 MG tablet, TAKE 1 TABLET (50 MG TOTAL) BY MOUTH 3 (THREE) TIMES DAILY AS NEEDED FOR ANXIETY., Disp: 90 tablet, Rfl: 3   Ketotifen Fumarate (ALLERGY EYE DROPS OP), Place 1 drop into both eyes daily as needed (allergies)., Disp: , Rfl:    losartan (COZAAR) 50 MG tablet, Take 1 tablet (50 mg total) by mouth daily., Disp: 90 tablet, Rfl: 0   Menthol (ICY HOT) 5 % PTCH, Apply 1 patch topically daily as needed (hip pain)., Disp: , Rfl:    Multiple Vitamin (MULTIVITAMIN WITH MINERALS) TABS tablet, Take 1 tablet by mouth daily., Disp: , Rfl:    oxybutynin (DITROPAN XL) 15 MG 24 hr tablet, Take 1 tablet (15 mg total) by mouth at bedtime., Disp: 90 tablet, Rfl: 0   oxyCODONE-acetaminophen (PERCOCET/ROXICET) 5-325 MG tablet, Take 1 tablet by mouth every 4 (four) hours as needed for severe pain., Disp: 30 tablet, Rfl: 0   pantoprazole (PROTONIX) 40 MG tablet, Take 1 tablet (40 mg total) by mouth 2 (two) times daily., Disp: 60 tablet, Rfl: 5   POTASSIUM PO, Take 1 tablet by mouth daily., Disp: , Rfl:    QUEtiapine (SEROQUEL) 300 MG tablet, Take 1 tablet (300 mg total) by mouth at  bedtime., Disp: 30 tablet, Rfl: 3   sertraline (ZOLOFT) 100 MG tablet, Take 1.5 tablets (150 mg total) by mouth daily., Disp: 45 tablet, Rfl: 3   VITAMIN D PO, Take 1 capsule by mouth daily., Disp: , Rfl:    Allergies  Allergen Reactions   Tizanidine Other (See Comments)    May 2022- Reported hallucinations vs dreams w/ sleep walking at home when tizanidine added post-op    Amoxicillin     Yeast infection     Past Medical History:  Diagnosis Date   Anxiety    Arthritis    COPD (chronic obstructive pulmonary disease) (HCC)    Depression    Dyspnea    GERD (gastroesophageal reflux disease)    Hypertension    Peripheral edema     2+ at PAT visit     Past Surgical History:  Procedure Laterality Date   ABDOMINAL HYSTERECTOMY  1986   CARPAL TUNNEL RELEASE Bilateral 1980   Per patient   SHOULDER SURGERY Left 1980   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   Procedure: RIGHT TOTAL HIP ARTHROPLASTY ANTERIOR APPROACH;  Surgeon: Gean Birchwood, MD;  Location: [facility];  Service: Orthopedics;  Laterality: Right;    Family History  Problem Relation Age of Onset   Ulcers Father    Hypotension Sister    Hypertension Brother     Social History   Tobacco Use   Smoking status: Every Day    Packs/day: 1.00    Years: 25.00    Pack years: 25.00    Types: Cigarettes    Start date: 08/04/1978   Smokeless tobacco: Never  Vaping Use   Vaping Use: Never used  Substance Use Topics   Alcohol use: Not Currently   Drug use: Never    ROS   Objective:   Vitals: BP (!) 184/78 (BP Location: Right Arm)   Pulse (!) 107   Temp 98.6 F (37 C) (Oral)   Resp 18   SpO2 95%   BP Readings from Last 3 Encounters:  04/16/21 (!) 184/78  04/04/21 (!) 157/82  03/07/21 (!) 156/80   BP recheck was 170/80.   Physical Exam Constitutional:      General: She is not in acute distress.    Appearance: Normal appearance. She is well-developed. She is not ill-appearing, toxic-appearing or diaphoretic.  HENT:     Head: Normocephalic and atraumatic.     Nose:     Right Nostril: Epistaxis (controlled) present. No foreign body, septal hematoma or occlusion.     Left Nostril: No foreign body, epistaxis, septal hematoma or occlusion.     Right Turbinates: Not enlarged, swollen or pale.     Left Turbinates: Not enlarged, swollen or pale.     Right Sinus: No maxillary sinus tenderness or frontal sinus tenderness.     Left Sinus: No maxillary sinus tenderness or frontal sinus tenderness.     Mouth/Throat:     Mouth: Mucous membranes are moist.  Eyes:     Extraocular Movements: Extraocular movements intact.     Pupils: Pupils are equal,  round, and reactive to light.  Cardiovascular:     Rate and Rhythm: Normal rate and regular rhythm.     Pulses: Normal pulses.     Heart sounds: Normal heart sounds. No murmur heard.   No friction rub. No gallop.  Pulmonary:     Effort: Pulmonary effort is normal. No respiratory distress.     Breath sounds: Normal breath sounds. No stridor. No wheezing,  rhonchi or rales.  Skin:    General: Skin is warm and dry.     Findings: No rash.  Neurological:     Mental Status: She is alert and oriented to person, place, and time.     Cranial Nerves: No cranial nerve deficit.     Motor: No weakness.     Coordination: Coordination normal.     Gait: Gait normal.     Deep Tendon Reflexes: Reflexes normal.     Comments: Negative Romberg and pronator drift.  Psychiatric:        Mood and Affect: Mood normal.        Behavior: Behavior normal.        Thought Content: Thought content normal.        Judgment: Judgment normal.    Assessment and Plan :   PDMP not reviewed this encounter.  1. Epistaxis   2. Essential hypertension     Epistaxis controlled, Afrin applied while in clinic.  Recommended 1 more use later tonight.  Follow-up with ENT as soon as possible.  Regarding her blood pressure, no signs of an acute stroke.  Recommended continued compliance with her medications as prescribed by her PCP.  Also recommended close follow-up with her PCP for recheck on her BP. Counseled patient on potential for adverse effects with medications prescribed/recommended today, ER and return-to-clinic precautions discussed, patient verbalized understanding.    Wallis Bamberg, PA-C 04/16/21 1428

## 2021-04-22 ENCOUNTER — Other Ambulatory Visit: Payer: Self-pay

## 2021-04-22 ENCOUNTER — Other Ambulatory Visit: Payer: Self-pay | Admitting: Family

## 2021-04-22 DIAGNOSIS — M87051 Idiopathic aseptic necrosis of right femur: Secondary | ICD-10-CM

## 2021-04-22 MED ORDER — FOLIC ACID 1 MG PO TABS: ORAL_TABLET | Freq: Every day | ORAL | 0 refills | Status: DC

## 2021-04-22 MED ORDER — MUPIROCIN 2 % EX OINT
TOPICAL_OINTMENT | CUTANEOUS | 0 refills | Status: DC
  Filled 2021-04-22: qty 22, 7d supply, fill #0

## 2021-04-23 ENCOUNTER — Other Ambulatory Visit: Payer: Self-pay

## 2021-04-23 MED ORDER — MUPIROCIN 2 % EX OINT
TOPICAL_OINTMENT | CUTANEOUS | 0 refills | Status: DC
  Filled 2021-04-23: qty 22, 7d supply, fill #0

## 2021-04-25 ENCOUNTER — Other Ambulatory Visit: Payer: Self-pay

## 2021-05-02 ENCOUNTER — Other Ambulatory Visit: Payer: Self-pay

## 2021-05-02 ENCOUNTER — Ambulatory Visit (INDEPENDENT_AMBULATORY_CARE_PROVIDER_SITE_OTHER): Payer: Medicaid Other | Admitting: Family Medicine

## 2021-05-02 ENCOUNTER — Encounter: Payer: Self-pay | Admitting: Family Medicine

## 2021-05-02 VITALS — BP 147/76 | HR 100 | Resp 16 | Wt 206.4 lb

## 2021-05-02 DIAGNOSIS — G629 Polyneuropathy, unspecified: Secondary | ICD-10-CM | POA: Diagnosis not present

## 2021-05-02 DIAGNOSIS — I10 Essential (primary) hypertension: Secondary | ICD-10-CM

## 2021-05-02 DIAGNOSIS — J42 Unspecified chronic bronchitis: Secondary | ICD-10-CM | POA: Diagnosis not present

## 2021-05-02 MED ORDER — BUDESONIDE-FORMOTEROL FUMARATE 160-4.5 MCG/ACT IN AERO: 2.0000 | INHALATION_SPRAY | Freq: Two times a day (BID) | RESPIRATORY_TRACT | 12 refills | Status: DC

## 2021-05-02 NOTE — Progress Notes (Signed)
Patient request results of sleep study  Patient need a stronger inhaler Patient request support hose  Patient need a referrals

## 2021-05-02 NOTE — Progress Notes (Signed)
Established Patient Office Visit  Subjective:  Patient ID: [name], female    DOB: [birth year]  Age: 60 y.o. MRN: [id number]  CC:  Chief Complaint  Patient presents with   Medication Refill   Hypertension   Follow-up    HPI [name] presents for follow-up of chronic medical issues.  Patient reports improvement of the neuropathy with the gabapentin.  Patient also reports that she had gotten this lower dose of Symbicort and would like to be prescribed a higher dose.  Past Medical History:  Diagnosis Date   Anxiety    Arthritis    COPD (chronic obstructive pulmonary disease) (HCC)    Depression    Dyspnea    GERD (gastroesophageal reflux disease)    Hypertension    Peripheral edema    2+ at PAT visit      Social History   Socioeconomic History   Marital status: Single    Spouse name: Not on file   Number of children: 0   Years of education: 68   Highest education level: GED or equivalent  Occupational History   Occupation: Armed forces operational officer  Tobacco Use   Smoking status: Every Day    Packs/day: 1.00    Years: 25.00    Pack years: 25.00    Types: Cigarettes    Start date: 08/04/1978   Smokeless tobacco: Never  Vaping Use   Vaping Use: Never used  Substance and Sexual Activity   Alcohol use: Not Currently   Drug use: Never   Sexual activity: Never  Other Topics Concern   Not on file  Social History Narrative   Lives and step-dad, they are able to help.   Social Determinants of Health   Financial Resource Strain: Not on file  Food Insecurity: Not on file  Transportation Needs: Not on file  Physical Activity: Not on file  Stress: Not on file  Social Connections: Not on file  Intimate Partner Violence: Not on file    ROS Review of Systems  Neurological:  Positive for numbness.  All other systems reviewed and are negative.  Objective:   Today's Vitals: BP (!) 147/76 (BP Location: Right Arm, Patient Position: Sitting, Cuff Size: Large)    Pulse 100   Resp 16   Wt 206 lb 6.4 oz (93.6 kg)   SpO2 93%   BMI 40.31 kg/m   Physical Exam Vitals and nursing note reviewed.  Constitutional:      General: She is not in acute distress.    Appearance: She is obese.  Cardiovascular:     Rate and Rhythm: Normal rate and regular rhythm.  Pulmonary:     Effort: Pulmonary effort is normal.     Breath sounds: Normal breath sounds.  Abdominal:     Palpations: Abdomen is soft.     Tenderness: There is no abdominal tenderness.  Musculoskeletal:     Right lower leg: No edema.     Left lower leg: No edema.  Neurological:     General: No focal deficit present.     Mental Status: She is alert and oriented to person, place, and time.    Assessment & Plan:   1. Uncontrolled hypertension Improved but still somewhat elevated above goal.  We will continue present management and monitor.  2. Chronic bronchitis, unspecified chronic bronchitis type (HCC) Higher dose medication was refilled.  Continue present management.  3. Neuropathy Improved with present management.  Continue and monitor.   Outpatient Encounter Medications as of 05/02/2021  Medication Sig   acetaminophen (TYLENOL) 325 MG tablet Take 2 tablets (650 mg total) by mouth every 6 (six) hours as needed for mild pain (or Fever >/= 101).   amLODipine (NORVASC) 10 MG tablet TAKE 1 TABLET(10 MG) BY MOUTH DAILY   aspirin EC 81 MG tablet Take 1 tablet (81 mg total) by mouth 2 (two) times daily.   atorvastatin (LIPITOR) 40 MG tablet Take 1 tablet (40 mg total) by mouth daily.   budesonide-formoterol (SYMBICORT) 160-4.5 MCG/ACT inhaler Inhale 2 puffs into the lungs 2 (two) times daily.   busPIRone (BUSPAR) 5 MG tablet Take 1 tablet (5 mg total) by mouth 2 (two) times daily.   ferrous gluconate (FERGON) 324 MG tablet Take 1 tablet (324 mg total) by mouth daily with lunch.   fluticasone furoate-vilanterol (BREO ELLIPTA) 100-25 MCG/INH AEPB Inhale 1 puff into the lungs daily.   folic  acid (FOLVITE) 1 MG tablet TAKE 1 TABLET (1 MG TOTAL) BY MOUTH DAILY.   gabapentin (NEURONTIN) 300 MG capsule Take 1 capsule (300 mg total) by mouth 3 (three) times daily.   hydrOXYzine (ATARAX/VISTARIL) 50 MG tablet TAKE 1 TABLET (50 MG TOTAL) BY MOUTH 3 (THREE) TIMES DAILY AS NEEDED FOR ANXIETY.   Ketotifen Fumarate (ALLERGY EYE DROPS OP) Place 1 drop into both eyes daily as needed (allergies).   losartan (COZAAR) 50 MG tablet Take 1 tablet (50 mg total) by mouth daily.   Menthol (ICY HOT) 5 % PTCH Apply 1 patch topically daily as needed (hip pain).   Multiple Vitamin (MULTIVITAMIN WITH MINERALS) TABS tablet Take 1 tablet by mouth daily.   oxybutynin (DITROPAN XL) 15 MG 24 hr tablet Take 1 tablet (15 mg total) by mouth at bedtime.   oxyCODONE-acetaminophen (PERCOCET/ROXICET) 5-325 MG tablet Take 1 tablet by mouth every 4 (four) hours as needed for severe pain.   pantoprazole (PROTONIX) 40 MG tablet Take 1 tablet (40 mg total) by mouth 2 (two) times daily.   POTASSIUM PO Take 1 tablet by mouth daily.   QUEtiapine (SEROQUEL) 300 MG tablet Take 1 tablet (300 mg total) by mouth at bedtime.   sertraline (ZOLOFT) 100 MG tablet Take 1.5 tablets (150 mg total) by mouth daily.   VITAMIN D PO Take 1 capsule by mouth daily.   [DISCONTINUED] budesonide-formoterol (SYMBICORT) 80-4.5 MCG/ACT inhaler Inhale 2 puffs into the lungs 2 (two) times daily.   [DISCONTINUED] mupirocin ointment (BACTROBAN) 2 % Apply to nose 3 times daily   [DISCONTINUED] mupirocin ointment (BACTROBAN) 2 % Apply ointment to nose 3 times daily   [DISCONTINUED] mupirocin ointment (BACTROBAN) 2 % Apply to nose 3 times a day   No facility-administered encounter medications on file as of 05/02/2021.    Follow-up: No follow-ups on file.   Tommie Raymond, MD

## 2021-05-14 ENCOUNTER — Other Ambulatory Visit: Payer: Self-pay | Admitting: Family Medicine

## 2021-05-14 ENCOUNTER — Encounter: Payer: Self-pay | Admitting: Family Medicine

## 2021-05-14 DIAGNOSIS — N3281 Overactive bladder: Secondary | ICD-10-CM

## 2021-05-14 MED ORDER — OXYBUTYNIN CHLORIDE ER 15 MG PO TB24: 15.0000 mg | ORAL_TABLET | Freq: Every day | ORAL | 0 refills | Status: DC

## 2021-05-29 ENCOUNTER — Other Ambulatory Visit (HOSPITAL_COMMUNITY): Payer: Self-pay

## 2021-06-01 ENCOUNTER — Other Ambulatory Visit: Payer: Self-pay | Admitting: Family

## 2021-06-01 DIAGNOSIS — I10 Essential (primary) hypertension: Secondary | ICD-10-CM

## 2021-06-04 ENCOUNTER — Other Ambulatory Visit: Payer: Self-pay | Admitting: *Deleted

## 2021-06-04 ENCOUNTER — Telehealth: Payer: Self-pay | Admitting: Family Medicine

## 2021-06-04 DIAGNOSIS — G629 Polyneuropathy, unspecified: Secondary | ICD-10-CM

## 2021-06-04 MED ORDER — GABAPENTIN 300 MG PO CAPS: 300.0000 mg | ORAL_CAPSULE | Freq: Three times a day (TID) | ORAL | 1 refills | Status: DC

## 2021-06-04 NOTE — Telephone Encounter (Signed)
gabapentin (NEURONTIN) 300 MG capsule [[id number]]   Pharmacy  [facility] [phone] - [place], [place] - [address] AT [facility] [address] & [address]  [address] [place] [address]  Phone:  [phone]  Fax:  [phone]

## 2021-06-25 ENCOUNTER — Other Ambulatory Visit: Payer: Self-pay | Admitting: Family Medicine

## 2021-06-25 ENCOUNTER — Other Ambulatory Visit: Payer: Self-pay | Admitting: Family

## 2021-06-25 DIAGNOSIS — I10 Essential (primary) hypertension: Secondary | ICD-10-CM

## 2021-06-25 DIAGNOSIS — N3281 Overactive bladder: Secondary | ICD-10-CM

## 2021-06-25 DIAGNOSIS — M87051 Idiopathic aseptic necrosis of right femur: Secondary | ICD-10-CM

## 2021-06-25 MED ORDER — OXYBUTYNIN CHLORIDE ER 15 MG PO TB24: 15.0000 mg | ORAL_TABLET | Freq: Every day | ORAL | 0 refills | Status: DC

## 2021-06-25 MED ORDER — FOLIC ACID 1 MG PO TABS: ORAL_TABLET | Freq: Every day | ORAL | 0 refills | Status: DC

## 2021-06-25 MED ORDER — LOSARTAN POTASSIUM 50 MG PO TABS: 50.0000 mg | ORAL_TABLET | Freq: Every day | ORAL | 0 refills | Status: DC

## 2021-07-02 ENCOUNTER — Encounter (HOSPITAL_COMMUNITY): Payer: Self-pay | Admitting: Psychiatry

## 2021-07-02 ENCOUNTER — Telehealth (INDEPENDENT_AMBULATORY_CARE_PROVIDER_SITE_OTHER): Payer: Medicaid Other | Admitting: Psychiatry

## 2021-07-02 DIAGNOSIS — F32A Depression, unspecified: Secondary | ICD-10-CM | POA: Diagnosis not present

## 2021-07-02 DIAGNOSIS — F411 Generalized anxiety disorder: Secondary | ICD-10-CM

## 2021-07-02 MED ORDER — SERTRALINE HCL 100 MG PO TABS: 150.0000 mg | ORAL_TABLET | Freq: Every day | ORAL | 3 refills | Status: DC

## 2021-07-02 MED ORDER — BUSPIRONE HCL 5 MG PO TABS: 5.0000 mg | ORAL_TABLET | Freq: Two times a day (BID) | ORAL | 3 refills | Status: DC

## 2021-07-02 MED ORDER — HYDROXYZINE HCL 50 MG PO TABS: ORAL_TABLET | ORAL | 3 refills | Status: DC

## 2021-07-02 MED ORDER — QUETIAPINE FUMARATE 300 MG PO TABS: 300.0000 mg | ORAL_TABLET | Freq: Every day | ORAL | 3 refills | Status: DC

## 2021-07-02 NOTE — Progress Notes (Signed)
BH MD/PA/NP OP Progress Note Virtual Visit via Telephone Note  I connected with [name] on 07/02/21 at  4:00 PM EST by telephone and verified that I am speaking with the correct person using two identifiers.  Location: Patient: home Provider: Clinic   I discussed the limitations, risks, security and privacy concerns of performing an evaluation and management service by telephone and the availability of in person appointments. I also discussed with the patient that there may be a patient responsible charge related to this service. The patient expressed understanding and agreed to proceed.   I provided 30 minutes of non-face-to-face time during this encounter.    07/02/2021 4:16 PM [name]  MRN:  [id number]  Chief Complaint: "I think the meds are working really well"  HPI:  60 year old female seen today for follow-up psychiatric evaluation.  She has a psychiatric history MDD, severe alcohol use disorder now in remission.  She is currently managed on Zoloft 100 mg daily, Seroquel 300 mg nightly, and hydroxyzine 50 mg 3 times daily as needed.  Today she notes her medications are effective in managing her psychiatric conditions.   Today patient logged in virtually so her assessment was done over the phone.  During exam she was pleasant, cooperative, and engaged in conversation.  She informed Clinical research associate that the medications are working really well.  She notes that she herself is doing well.  She informed Clinical research associate that she enjoyed her Thanksgiving holiday and is looking forward to her birthday and Christmas.  She informed Clinical research associate that her anxiety and depression are minimal however notes that at this time she prefers not to do a GAD-7 and PHQ-9.  She informed Clinical research associate that she would do the screenings at her next visit.  Today she denies SI/HI/VAH/mania or paranoia.  She endorses adequate sleep and appetite.    Patient continues to live with her parents and notes that they are doing well.  No  medication changes made.  Patient agreeable to continue medications as prescribed.  She will follow up with outpatient counseling for therapy.  No other concerns noted at this time. Visit Diagnosis:    ICD-10-CM   1. Mild depression  F32.A sertraline (ZOLOFT) 100 MG tablet    QUEtiapine (SEROQUEL) 300 MG tablet    2. Generalized anxiety disorder  F41.1 sertraline (ZOLOFT) 100 MG tablet    hydrOXYzine (ATARAX) 50 MG tablet    busPIRone (BUSPAR) 5 MG tablet      Past Psychiatric History: MDD, severe alcohol use disorder now in remission  Past Medical History:  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   Procedure: RIGHT TOTAL HIP ARTHROPLASTY ANTERIOR APPROACH;  Surgeon: Gean Birchwood, MD;  Location: [facility];  Service: Orthopedics;  Laterality: Right;    Family Psychiatric History: denied  Family History:  Family History  Problem Relation Age of Onset   Ulcers Father    Hypotension Sister    Hypertension Brother     Social History:  Social History   Socioeconomic  History   Marital status: Single    Spouse name: Not on file   Number of children: 0   Years of education: 70   Highest education level: GED or equivalent  Occupational History   Occupation: Armed forces operational officer  Tobacco Use   Smoking status: Every Day    Packs/day: 1.00    Years: 25.00    Pack years: 25.00    Types: Cigarettes    Start date: 08/04/1978   Smokeless tobacco: Never  Vaping Use   Vaping Use: Never used  Substance and Sexual Activity   Alcohol use: Not Currently   Drug use: Never   Sexual  activity: Never  Other Topics Concern   Not on file  Social History Narrative   Lives and step-dad, they are able to help.   Social Determinants of Health   Financial Resource Strain: Not on file  Food Insecurity: Not on file  Transportation Needs: Not on file  Physical Activity: Not on file  Stress: Not on file  Social Connections: Not on file    Allergies:  Allergies  Allergen Reactions   Amoxicillin     Yeast infection     Metabolic Disorder Labs: Lab Results  Component Value Date   HGBA1C 6.1 (A) 03/07/2021   MPG 140 12/25/2020   MPG 99.67 11/04/2019   No results found for: PROLACTIN Lab Results  Component Value Date   CHOL 234 (H) 12/11/2020   TRIG 245 (H) 12/11/2020   HDL 55 12/11/2020   CHOLHDL 4.3 12/11/2020   LDLCALC 135 (H) 12/11/2020   Lab Results  Component Value Date   TSH 2.370 05/22/2020    Therapeutic Level Labs: No results found for: LITHIUM No results found for: VALPROATE No components found for:  CBMZ  Current Medications: Current Outpatient Medications  Medication Sig Dispense Refill   acetaminophen (TYLENOL) 325 MG tablet Take 2 tablets (650 mg total) by mouth every 6 (six) hours as needed for mild pain (or Fever >/= 101).     amLODipine (NORVASC) 10 MG tablet TAKE 1 TABLET(10 MG) BY MOUTH DAILY 90 tablet 0   aspirin EC 81 MG tablet Take 1 tablet (81 mg total) by mouth 2 (two) times daily. 60 tablet 0   atorvastatin (LIPITOR) 40 MG tablet Take 1 tablet (40 mg total) by mouth daily. 90 tablet 3   budesonide-formoterol (SYMBICORT) 160-4.5 MCG/ACT inhaler Inhale 2 puffs into the lungs 2 (two) times daily. 1 each 12   busPIRone (BUSPAR) 5 MG tablet Take 1 tablet (5 mg total) by mouth 2 (two) times daily. 60 tablet 3   ferrous gluconate (FERGON) 324 MG tablet Take 1 tablet (324 mg total) by mouth daily with lunch. 30 tablet 0   fluticasone furoate-vilanterol (BREO ELLIPTA) 100-25 MCG/INH AEPB Inhale 1 puff into the lungs daily. 60 each 11    folic acid (FOLVITE) 1 MG tablet TAKE 1 TABLET (1 MG TOTAL) BY MOUTH DAILY. 90 tablet 0   gabapentin (NEURONTIN) 300 MG capsule Take 1 capsule (300 mg total) by mouth 3 (three) times daily. 90 capsule 1   hydrOXYzine (ATARAX) 50 MG tablet TAKE 1 TABLET (50 MG TOTAL) BY MOUTH 3 (THREE) TIMES DAILY AS NEEDED FOR ANXIETY. 90 tablet 3   Ketotifen Fumarate (ALLERGY EYE DROPS OP) Place 1 drop into both eyes daily as needed (allergies).     losartan (COZAAR) 50 MG tablet Take 1 tablet (50 mg total) by mouth daily. 90 tablet 0   Menthol (ICY HOT) 5 %  PTCH Apply 1 patch topically daily as needed (hip pain).     Multiple Vitamin (MULTIVITAMIN WITH MINERALS) TABS tablet Take 1 tablet by mouth daily.     oxybutynin (DITROPAN XL) 15 MG 24 hr tablet Take 1 tablet (15 mg total) by mouth at bedtime. 90 tablet 0   oxyCODONE-acetaminophen (PERCOCET/ROXICET) 5-325 MG tablet Take 1 tablet by mouth every 4 (four) hours as needed for severe pain. 30 tablet 0   pantoprazole (PROTONIX) 40 MG tablet Take 1 tablet (40 mg total) by mouth 2 (two) times daily. 60 tablet 5   POTASSIUM PO Take 1 tablet by mouth daily.     QUEtiapine (SEROQUEL) 300 MG tablet Take 1 tablet (300 mg total) by mouth at bedtime. 30 tablet 3   sertraline (ZOLOFT) 100 MG tablet Take 1.5 tablets (150 mg total) by mouth daily. 45 tablet 3   VITAMIN D PO Take 1 capsule by mouth daily.     No current facility-administered medications for this visit.     Musculoskeletal: Strength & Muscle Tone:  Unable to assess due to telephone Gait & Station:  Unable to assess due to telephone Patient leans: N/A  Psychiatric Specialty Exam: Review of Systems  There were no vitals taken for this visit.There is no height or weight on file to calculate BMI.  General Appearance:  Unable to assess due to telephone  Eye Contact:   Unable to assess due to telephone  Speech:  Clear and Coherent and Normal Rate  Volume:  Normal  Mood:  Euthymic  Affect:  Appropriate  and Congruent  Thought Process:  Coherent, Goal Directed, and Linear  Orientation:  Full (Time, Place, and Person)  Thought Content: WDL and Logical   Suicidal Thoughts:  No  Homicidal Thoughts:  No  Memory:  Immediate;   Good Recent;   Good Remote;   Good  Judgement:  Good  Insight:  Good  Psychomotor Activity:   Unable to assess due to telephone  Concentration:  Concentration: Good and Attention Span: Good  Recall:  Good  Fund of Knowledge: Good  Language: Good  Akathisia:   Unable to assess due to telephone  Handed:  Right  AIMS (if indicated): not done  Assets:  Communication Skills Desire for Improvement Financial Resources/Insurance Housing Leisure Time Physical Health Social Support  ADL's:  Intact  Cognition: WNL  Sleep:  Good   Screenings: GAD-7    Flowsheet Row Office Visit from 04/04/2021 in Primary Care at [facility] Video Visit from 03/27/2021 in [facility] Telemedicine from 01/07/2021 in Primary Care at [facility] Office Visit from 12/11/2020 in Primary Care at [facility] Clinical Support from 11/05/2020 in [facility]  Total GAD-7 Score 11 11 6 13 12       PHQ2-9    Flowsheet Row Office Visit from 05/02/2021 in Primary Care at [facility] Office Visit from 04/04/2021 in Primary Care at [facility] Video Visit from 03/27/2021 in [facility] Office Visit from 03/07/2021 in Primary Care at [facility] Office Visit from 02/13/2021 in Primary Care at [facility] Total Score 4 6 6  0 0  PHQ-9 Total Score 14 18 10  -- 0      Flowsheet Row ED from 04/16/2021 in [facility] at [facility]  Admission (Discharged) from 12/24/2020 in [facility] Pre-Admission Testing 60 from 12/17/2020 in [facility]  C-SSRS RISK CATEGORY No Risk No  Risk No Risk        Assessment and Plan: Patient notes that  she is doing well on her current medication regimen.  No medication changes made today.  Patient agreeable to continue medications as prescribed.  1. Generalized anxiety disorder  Continue- sertraline (ZOLOFT) 100 MG tablet; Take 1.5 tablets (150 mg total) by mouth daily.  Dispense: 45 tablet; Refill: 3 Continue- hydrOXYzine (ATARAX) 50 MG tablet; TAKE 1 TABLET (50 MG TOTAL) BY MOUTH 3 (THREE) TIMES DAILY AS NEEDED FOR ANXIETY.  Dispense: 90 tablet; Refill: 3 Continue- busPIRone (BUSPAR) 5 MG tablet; Take 1 tablet (5 mg total) by mouth 2 (two) times daily.  Dispense: 60 tablet; Refill: 3  2. Mild depression  Continue- sertraline (ZOLOFT) 100 MG tablet; Take 1.5 tablets (150 mg total) by mouth daily.  Dispense: 45 tablet; Refill: 3 Continue- QUEtiapine (SEROQUEL) 300 MG tablet; Take 1 tablet (300 mg total) by mouth at bedtime.  Dispense: 30 tablet; Refill: 3   Follow-up in 3 months Follow-up with therapy  Shanna Cisco, NP 07/02/2021, 4:16 PM

## 2021-07-04 ENCOUNTER — Other Ambulatory Visit: Payer: Self-pay | Admitting: Family Medicine

## 2021-07-04 ENCOUNTER — Other Ambulatory Visit: Payer: Self-pay | Admitting: Family

## 2021-07-04 DIAGNOSIS — I10 Essential (primary) hypertension: Secondary | ICD-10-CM

## 2021-07-04 DIAGNOSIS — M87051 Idiopathic aseptic necrosis of right femur: Secondary | ICD-10-CM

## 2021-07-04 DIAGNOSIS — N3281 Overactive bladder: Secondary | ICD-10-CM

## 2021-07-12 ENCOUNTER — Encounter: Payer: Self-pay | Admitting: Family Medicine

## 2021-07-12 NOTE — Telephone Encounter (Signed)
Patient was called and she is having congestion with a cough . Patient is said to be coughing up greenish mucus. Patient was advise to go to [facility]  for further evaluation.

## 2021-07-18 NOTE — Telephone Encounter (Signed)
Patient is aware she need appt

## 2021-07-23 ENCOUNTER — Encounter: Payer: Self-pay | Admitting: Family Medicine

## 2021-08-04 ENCOUNTER — Other Ambulatory Visit: Payer: Self-pay | Admitting: Family Medicine

## 2021-08-04 DIAGNOSIS — G629 Polyneuropathy, unspecified: Secondary | ICD-10-CM

## 2021-08-06 ENCOUNTER — Ambulatory Visit (INDEPENDENT_AMBULATORY_CARE_PROVIDER_SITE_OTHER): Payer: Medicaid Other | Admitting: Family Medicine

## 2021-08-06 ENCOUNTER — Encounter: Payer: Self-pay | Admitting: Family Medicine

## 2021-08-06 ENCOUNTER — Other Ambulatory Visit: Payer: Self-pay

## 2021-08-06 VITALS — BP 144/83 | HR 88 | Temp 98.3°F | Resp 16 | Wt 207.6 lb

## 2021-08-06 DIAGNOSIS — M25552 Pain in left hip: Secondary | ICD-10-CM | POA: Diagnosis not present

## 2021-08-06 DIAGNOSIS — G629 Polyneuropathy, unspecified: Secondary | ICD-10-CM | POA: Diagnosis not present

## 2021-08-06 DIAGNOSIS — J209 Acute bronchitis, unspecified: Secondary | ICD-10-CM | POA: Diagnosis not present

## 2021-08-06 LAB — POCT URINALYSIS DIP (CLINITEK)
Bilirubin, UA: NEGATIVE
Blood, UA: NEGATIVE
Glucose, UA: NEGATIVE mg/dL
Ketones, POC UA: NEGATIVE mg/dL
Nitrite, UA: NEGATIVE
POC PROTEIN,UA: NEGATIVE
Spec Grav, UA: 1.015 (ref 1.010–1.025)
Urobilinogen, UA: 0.2 E.U./dL
pH, UA: 8.5 — AB (ref 5.0–8.0)

## 2021-08-06 MED ORDER — TRIAMCINOLONE ACETONIDE 40 MG/ML IJ SUSP
40.0000 mg | Freq: Once | INTRAMUSCULAR | Status: AC
  Administered 2021-08-06: 40 mg via INTRAMUSCULAR

## 2021-08-06 MED ORDER — AZITHROMYCIN 250 MG PO TABS: ORAL_TABLET | ORAL | 0 refills | Status: AC

## 2021-08-06 MED ORDER — BENZONATATE 100 MG PO CAPS: 100.0000 mg | ORAL_CAPSULE | Freq: Two times a day (BID) | ORAL | 0 refills | Status: DC | PRN

## 2021-08-06 MED ORDER — GABAPENTIN 300 MG PO CAPS: 300.0000 mg | ORAL_CAPSULE | Freq: Three times a day (TID) | ORAL | 1 refills | Status: DC

## 2021-08-06 MED ORDER — TRAMADOL HCL 50 MG PO TABS: 50.0000 mg | ORAL_TABLET | Freq: Four times a day (QID) | ORAL | 0 refills | Status: AC | PRN

## 2021-08-06 NOTE — Progress Notes (Signed)
Patient c/o possible UTI, kidney infection, leg hurts 10/10  Patient c/o head congestion and sore throat x3 months. Patient said she is coughing up green mucus

## 2021-08-07 ENCOUNTER — Encounter: Payer: Self-pay | Admitting: Family Medicine

## 2021-08-07 ENCOUNTER — Other Ambulatory Visit: Payer: Self-pay | Admitting: Family Medicine

## 2021-08-07 NOTE — Progress Notes (Signed)
Established Patient Office Visit  Subjective:  Patient ID: [name], female    DOB: February 10, 1961  Age: 61 y.o. MRN: [id number]  CC:  Chief Complaint  Patient presents with   Follow-up   Cough    HPI [name] presents for increasing left hip pain. Patient also reports that she has been having a cough that is productive of purulent sputum.   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   Procedure: RIGHT TOTAL HIP ARTHROPLASTY ANTERIOR APPROACH;  Surgeon: Gean Birchwood, MD;  Location: [facility];  Service: Orthopedics;  Laterality: Right;    Family History  Problem Relation Age of Onset   Ulcers Father    Hypotension Sister    Hypertension Brother     Social History   Socioeconomic History   Marital status: Single    Spouse name: Not on file   Number of children: 0   Years of education: 12   Highest education level: GED or equivalent  Occupational History   Occupation: Armed forces operational officer  Tobacco Use   Smoking status: Every Day    Packs/day: 1.00    Years: 25.00    Pack years: 25.00    Types: Cigarettes    Start date: 08/04/1978   Smokeless tobacco: Never  Vaping Use   Vaping Use: Never used  Substance and Sexual Activity   Alcohol use: Not Currently   Drug use: Never   Sexual activity: Never  Other Topics Concern   Not on file  Social History Narrative   Lives and step-dad, they are able to help.   Social Determinants of Health   Financial Resource Strain: Not on file  Food Insecurity: Not on file  Transportation Needs: Not on file  Physical  Activity: Not on file  Stress: Not on file  Social Connections: Not on file  Intimate Partner Violence: Not on file    ROS Review of Systems  Genitourinary:  Negative for dysuria.  Musculoskeletal:  Positive for gait problem.  All other systems reviewed and are negative.  Objective:   Today's Vitals: BP (!) 144/83    Pulse 88    Temp 98.3 F (36.8 C) (Oral)    Resp 16    Wt 207 lb 9.6 oz (94.2 kg)    SpO2 95%    BMI 40.54 kg/m   Physical Exam Vitals and nursing note reviewed.  Constitutional:      General: She is not in acute distress.    Appearance: She is obese.  Cardiovascular:     Rate and Rhythm: Normal rate and regular rhythm.  Pulmonary:     Effort: Pulmonary effort is normal.     Breath sounds: Normal breath sounds.  Abdominal:     Palpations: Abdomen is soft.     Tenderness: There is no abdominal tenderness.  Musculoskeletal:     Left hip: Tenderness present. No deformity. Decreased range of motion.     Right lower leg: No edema.     Left lower leg:  No edema.     Comments: Patient utilizing cane  Neurological:     General: No focal deficit present.     Mental Status: She is alert and oriented to person, place, and time.    Assessment & Plan:   1. Acute bronchitis, unspecified organism Zithromax and tessalon perles  prescribed.  2. Neuropathy Appears stable with present management. Meds refilled  - gabapentin (NEURONTIN) 300 MG capsule; Take 1 capsule (300 mg total) by mouth 3 (three) times daily.  Dispense: 90 capsule; Refill: 1  3. Left hip pain Kenalog IM injection given. Tramadol prescribed. Keep scheduled appt with consultant for further eval/mgt  - POCT URINALYSIS DIP (CLINITEK) - triamcinolone acetonide (KENALOG-40) injection 40 mg    Outpatient Encounter Medications as of 08/06/2021  Medication Sig   acetaminophen (TYLENOL) 325 MG tablet Take 2 tablets (650 mg total) by mouth every 6 (six) hours as needed for mild pain (or Fever >/= 101).    amLODipine (NORVASC) 10 MG tablet TAKE 1 TABLET(10 MG) BY MOUTH DAILY   aspirin EC 81 MG tablet Take 1 tablet (81 mg total) by mouth 2 (two) times daily.   atorvastatin (LIPITOR) 40 MG tablet Take 1 tablet (40 mg total) by mouth daily.   azithromycin (ZITHROMAX) 250 MG tablet Take 2 tablets on day 1, then 1 tablet daily on days 2 through 5   benzonatate (TESSALON) 100 MG capsule Take 1 capsule (100 mg total) by mouth 2 (two) times daily as needed for cough.   budesonide-formoterol (SYMBICORT) 160-4.5 MCG/ACT inhaler Inhale 2 puffs into the lungs 2 (two) times daily.   busPIRone (BUSPAR) 5 MG tablet Take 1 tablet (5 mg total) by mouth 2 (two) times daily.   ferrous gluconate (FERGON) 324 MG tablet Take 1 tablet (324 mg total) by mouth daily with lunch.   fluticasone furoate-vilanterol (BREO ELLIPTA) 100-25 MCG/INH AEPB Inhale 1 puff into the lungs daily.   folic acid (FOLVITE) 1 MG tablet TAKE 1 TABLET (1 MG TOTAL) BY MOUTH DAILY.   hydrOXYzine (ATARAX) 50 MG tablet TAKE 1 TABLET (50 MG TOTAL) BY MOUTH 3 (THREE) TIMES DAILY AS NEEDED FOR ANXIETY.   Ketotifen Fumarate (ALLERGY EYE DROPS OP) Place 1 drop into both eyes daily as needed (allergies).   losartan (COZAAR) 50 MG tablet Take 1 tablet (50 mg total) by mouth daily.   Menthol (ICY HOT) 5 % PTCH Apply 1 patch topically daily as needed (hip pain).   Multiple Vitamin (MULTIVITAMIN WITH MINERALS) TABS tablet Take 1 tablet by mouth daily.   oxybutynin (DITROPAN XL) 15 MG 24 hr tablet Take 1 tablet (15 mg total) by mouth at bedtime.   pantoprazole (PROTONIX) 40 MG tablet Take 1 tablet (40 mg total) by mouth 2 (two) times daily.   POTASSIUM PO Take 1 tablet by mouth daily.   QUEtiapine (SEROQUEL) 300 MG tablet Take 1 tablet (300 mg total) by mouth at bedtime.   sertraline (ZOLOFT) 100 MG tablet Take 1.5 tablets (150 mg total) by mouth daily.   traMADol (ULTRAM) 50 MG tablet Take 1 tablet (50 mg total) by mouth every 6 (six) hours as needed for up to  7 days.   VITAMIN D PO Take 1 capsule by mouth daily.   [DISCONTINUED] gabapentin (NEURONTIN) 300 MG capsule Take 1 capsule (300 mg total) by mouth 3 (three) times daily.   [DISCONTINUED] oxyCODONE-acetaminophen (PERCOCET/ROXICET) 5-325 MG tablet Take 1 tablet by mouth every 4 (four) hours as needed for severe pain.   gabapentin (  NEURONTIN) 300 MG capsule Take 1 capsule (300 mg total) by mouth 3 (three) times daily.   [EXPIRED] triamcinolone acetonide (KENALOG-40) injection 40 mg    No facility-administered encounter medications on file as of 08/06/2021.    Follow-up: No follow-ups on file.   Tommie RaymondWilson, Marquis Diles P, MD

## 2021-08-13 ENCOUNTER — Telehealth (HOSPITAL_COMMUNITY): Payer: Self-pay

## 2021-08-13 NOTE — Telephone Encounter (Signed)
WRITER SUBMITTED PA FOR PATIENT'S QUETIAPINE 300MG  TABLET SENT FOR REVIEW. WAITING FOR DETERMINATION PA #  [id number]

## 2021-08-14 ENCOUNTER — Telehealth (HOSPITAL_COMMUNITY): Payer: Self-pay | Admitting: *Deleted

## 2021-08-14 NOTE — Telephone Encounter (Signed)
[facility] Faxed Approval for Medication QUEtiapine (SEROQUEL) 300 MG tablet  Quantity: 90 Duration: 90 Approved 12 Months: 08/13/21 to 08/1022

## 2021-08-16 ENCOUNTER — Other Ambulatory Visit: Payer: Self-pay | Admitting: *Deleted

## 2021-08-16 ENCOUNTER — Telehealth (HOSPITAL_COMMUNITY): Payer: Self-pay

## 2021-08-16 DIAGNOSIS — K219 Gastro-esophageal reflux disease without esophagitis: Secondary | ICD-10-CM

## 2021-08-16 MED ORDER — PANTOPRAZOLE SODIUM 40 MG PO TBEC: 40.0000 mg | DELAYED_RELEASE_TABLET | Freq: Two times a day (BID) | ORAL | 0 refills | Status: DC

## 2021-08-16 NOTE — Telephone Encounter (Signed)
[employer]  PRESCRIPTION C[employer] APPROVED  QUETIAPINE (SEROQUEL) 300MG  TABLET PA # [id number] EFFECTIVE 08/13/2021 TO 08/13/2022  S/W Jocelyn Lamer & ERIC NOTIFIED PHARMACY AND PATIENT

## 2021-09-06 ENCOUNTER — Other Ambulatory Visit: Payer: Self-pay | Admitting: *Deleted

## 2021-09-06 ENCOUNTER — Other Ambulatory Visit: Payer: Self-pay | Admitting: Family

## 2021-09-06 DIAGNOSIS — I10 Essential (primary) hypertension: Secondary | ICD-10-CM

## 2021-09-06 MED ORDER — AMLODIPINE BESYLATE 10 MG PO TABS: ORAL_TABLET | ORAL | 0 refills | Status: DC

## 2021-09-13 ENCOUNTER — Other Ambulatory Visit (HOSPITAL_COMMUNITY): Payer: Self-pay | Admitting: Psychiatry

## 2021-09-13 DIAGNOSIS — F32A Depression, unspecified: Secondary | ICD-10-CM

## 2021-09-16 ENCOUNTER — Other Ambulatory Visit: Payer: Self-pay | Admitting: Family Medicine

## 2021-09-20 ENCOUNTER — Other Ambulatory Visit: Payer: Self-pay | Admitting: Orthopedic Surgery

## 2021-09-20 DIAGNOSIS — Z96649 Presence of unspecified artificial hip joint: Secondary | ICD-10-CM

## 2021-09-24 ENCOUNTER — Encounter (HOSPITAL_COMMUNITY): Payer: Self-pay | Admitting: Psychiatry

## 2021-09-24 ENCOUNTER — Telehealth (INDEPENDENT_AMBULATORY_CARE_PROVIDER_SITE_OTHER): Payer: Medicaid Other | Admitting: Psychiatry

## 2021-09-24 DIAGNOSIS — F32A Depression, unspecified: Secondary | ICD-10-CM | POA: Diagnosis not present

## 2021-09-24 DIAGNOSIS — F411 Generalized anxiety disorder: Secondary | ICD-10-CM

## 2021-09-24 MED ORDER — HYDROXYZINE HCL 50 MG PO TABS: ORAL_TABLET | ORAL | 3 refills | Status: DC

## 2021-09-24 MED ORDER — QUETIAPINE FUMARATE 300 MG PO TABS: ORAL_TABLET | ORAL | 3 refills | Status: DC

## 2021-09-24 MED ORDER — SERTRALINE HCL 100 MG PO TABS: 200.0000 mg | ORAL_TABLET | Freq: Every day | ORAL | 3 refills | Status: DC

## 2021-09-24 MED ORDER — BUSPIRONE HCL 10 MG PO TABS: 10.0000 mg | ORAL_TABLET | Freq: Three times a day (TID) | ORAL | 3 refills | Status: DC

## 2021-09-24 NOTE — Progress Notes (Signed)
BH MD/PA/NP OP Progress Note Virtual Visit via Telephone Note  I connected with [name] on 09/24/21 at  2:30 PM EST by telephone and verified that I am speaking with the correct person using two identifiers.  Location: Patient: home Provider: Clinic   I discussed the limitations, risks, security and privacy concerns of performing an evaluation and management service by telephone and the availability of in person appointments. I also discussed with the patient that there may be a patient responsible charge related to this service. The patient expressed understanding and agreed to proceed.   I provided 30 minutes of non-face-to-face time during this encounter.    09/24/2021 12:14 PM [name]  MRN:  [id number]  Chief Complaint: "I have been more anxious"  HPI:  61 year old female seen today for follow-up psychiatric evaluation.  She has a psychiatric history MDD, severe alcohol use disorder now in remission.  She is currently managed on Zoloft 150 mg daily, Seroquel 300 mg nightly, BuSpar 5 mg twice daily, and hydroxyzine 50 mg 3 times daily as needed.  Today she notes her medications are somewhat effective in managing her psychiatric conditions.   Today patient was unable to login virtually so her assessment was done over the phone.  During exam she was pleasant, cooperative, and engaged in conversation.  She informed Clinical research associate that she has been more anxious lately.  She notes that she is concerned about her health (noting that she has severe pain and is waiting on insurance to cover her hip surgery).  She informed Clinical research associate that she is also concerned about getting an apartment so that she can move out of her parents home.  Today provider conducted a GAD-7 and patient scored a 15. Provider also conducted PHQ-9 and patient scored a 16.  She notes that she has gained weight due to immobility.  She also notes her sleep is poor due to her chronic pain.  She informed Clinical research associate that she sleeps  approximately 4 to 5 hours nightly.   Today she denies SI/HI/VAH/mania or paranoia.    Today patient agreeable to increase BuSpar 5 mg twice daily to 10 mg 3 times daily to help manage anxiety depression.  She is also agreeable to increasing Zoloft 150 mg to 200 mg to help manage anxiety and depression.  She will follow-up with pain management to address pain control.  Patient will continue all other medications as prescribed and follow-up with outpatient counseling for therapy.  No other concerns at this time.    Visit Diagnosis:    ICD-10-CM   1. Generalized anxiety disorder  F41.1 busPIRone (BUSPAR) 10 MG tablet    hydrOXYzine (ATARAX) 50 MG tablet    sertraline (ZOLOFT) 100 MG tablet    2. Mild depression  F32.A sertraline (ZOLOFT) 100 MG tablet    QUEtiapine (SEROQUEL) 300 MG tablet      Past Psychiatric History: MDD, severe alcohol use disorder now in remission  Past Medical History:  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  Procedure: RIGHT TOTAL HIP ARTHROPLASTY ANTERIOR APPROACH;  Surgeon: Gean Birchwoodowan, Frank, MD;  Location: [facility];  Service: Orthopedics;  Laterality: Right;    Family Psychiatric History: denied  Family History:  Family History  Problem Relation Age of Onset   Ulcers Father    Hypotension Sister    Hypertension Brother     Social History:  Social History   Socioeconomic History   Marital status: Single    Spouse name: Not on file   Number of children: 0   Years of education: 12   Highest education level: GED or equivalent  Occupational  History   Occupation: Armed forces operational officerACCOUNTING CLERK  Tobacco Use   Smoking status: Every Day    Packs/day: 1.00    Years: 25.00    Pack years: 25.00    Types: Cigarettes    Start date: 08/04/1978   Smokeless tobacco: Never  Vaping Use   Vaping Use: Never used  Substance and Sexual Activity   Alcohol use: Not Currently   Drug use: Never   Sexual activity: Never  Other Topics Concern   Not on file  Social History Narrative   Lives and step-dad, they are able to help.   Social Determinants of Health   Financial Resource Strain: Not on file  Food Insecurity: Not on file  Transportation Needs: Not on file  Physical Activity: Not on file  Stress: Not on file  Social Connections: Not on file    Allergies:  Allergies  Allergen Reactions   Amoxicillin     Yeast infection     Metabolic Disorder Labs: Lab Results  Component Value Date   HGBA1C 6.1 (A) 03/07/2021   MPG 140 12/25/2020   MPG 99.67 11/04/2019   No results found for: PROLACTIN Lab Results  Component Value Date   CHOL 234 (H) 12/11/2020   TRIG 245 (H) 12/11/2020   HDL 55 12/11/2020   CHOLHDL 4.3 12/11/2020   LDLCALC 135 (H) 12/11/2020   Lab Results  Component Value Date   TSH 2.370 05/22/2020    Therapeutic Level Labs: No results found for: LITHIUM No results found for: VALPROATE No components found for:  CBMZ  Current Medications: Current Outpatient Medications  Medication Sig Dispense Refill   acetaminophen (TYLENOL) 325 MG tablet Take 2 tablets (650 mg total) by mouth every 6 (six) hours as needed for mild pain (or Fever >/= 101).     amLODipine (NORVASC) 10 MG tablet TAKE 1 TABLET(10 MG) BY MOUTH DAILY 90 tablet 0   aspirin EC 81 MG tablet Take 1 tablet (81 mg total) by mouth 2 (two) times daily. 60 tablet 0   atorvastatin (LIPITOR) 40 MG tablet Take 1 tablet (40 mg total) by mouth daily. 90 tablet 3   benzonatate (TESSALON) 100 MG capsule Take 1 capsule (100 mg total) by mouth 2 (two) times daily as needed  for cough. 20 capsule 0   budesonide-formoterol (SYMBICORT) 160-4.5 MCG/ACT inhaler Inhale 2 puffs into the lungs 2 (two) times daily. 1 each 12   busPIRone (BUSPAR) 10 MG tablet Take 1 tablet (10 mg total) by mouth 3 (three) times daily. 90 tablet 3   ferrous gluconate (FERGON) 324 MG tablet Take 1 tablet (324 mg total) by mouth daily with lunch. 30 tablet 0   fluticasone furoate-vilanterol (BREO ELLIPTA) 100-25 MCG/INH AEPB Inhale 1 puff into the lungs daily. 60 each 11   folic acid (FOLVITE) 1 MG tablet TAKE 1 TABLET (1 MG TOTAL) BY MOUTH DAILY. 90 tablet 0  gabapentin (NEURONTIN) 300 MG capsule Take 1 capsule (300 mg total) by mouth 3 (three) times daily. 90 capsule 1   hydrOXYzine (ATARAX) 50 MG tablet TAKE 1 TABLET (50 MG TOTAL) BY MOUTH 3 (THREE) TIMES DAILY AS NEEDED FOR ANXIETY. 90 tablet 3   Ketotifen Fumarate (ALLERGY EYE DROPS OP) Place 1 drop into both eyes daily as needed (allergies).     losartan (COZAAR) 50 MG tablet Take 1 tablet (50 mg total) by mouth daily. 90 tablet 0   Menthol (ICY HOT) 5 % PTCH Apply 1 patch topically daily as needed (hip pain).     Multiple Vitamin (MULTIVITAMIN WITH MINERALS) TABS tablet Take 1 tablet by mouth daily.     oxybutynin (DITROPAN XL) 15 MG 24 hr tablet Take 1 tablet (15 mg total) by mouth at bedtime. 90 tablet 0   pantoprazole (PROTONIX) 40 MG tablet Take 1 tablet (40 mg total) by mouth 2 (two) times daily. 180 tablet 0   POTASSIUM PO Take 1 tablet by mouth daily.     QUEtiapine (SEROQUEL) 300 MG tablet TAKE 1 TABLET(300 MG) BY MOUTH AT BEDTIME 30 tablet 3   sertraline (ZOLOFT) 100 MG tablet Take 2 tablets (200 mg total) by mouth daily. 60 tablet 3   traMADol (ULTRAM) 50 MG tablet Take 50 mg by mouth every 6 (six) hours as needed.     VITAMIN D PO Take 1 capsule by mouth daily.     No current facility-administered medications for this visit.     Musculoskeletal: Strength & Muscle Tone:  Unable to assess due to telephone Gait & Station:   Unable to assess due to telephone Patient leans: N/A  Psychiatric Specialty Exam: Review of Systems  There were no vitals taken for this visit.There is no height or weight on file to calculate BMI.  General Appearance:  Unable to assess due to telephone  Eye Contact:   Unable to assess due to telephone  Speech:  Clear and Coherent and Normal Rate  Volume:  Normal  Mood:  Anxious and Depressed  Affect:  Appropriate and Congruent  Thought Process:  Coherent, Goal Directed, and Linear  Orientation:  Full (Time, Place, and Person)  Thought Content: WDL and Logical   Suicidal Thoughts:  No  Homicidal Thoughts:  No  Memory:  Immediate;   Good Recent;   Good Remote;   Good  Judgement:  Good  Insight:  Good  Psychomotor Activity:   Unable to assess due to telephone  Concentration:  Concentration: Good and Attention Span: Good  Recall:  Good  Fund of Knowledge: Good  Language: Good  Akathisia:   Unable to assess due to telephone  Handed:  Right  AIMS (if indicated): not done  Assets:  Communication Skills Desire for Improvement Financial Resources/Insurance Housing Leisure Time Physical Health Social Support  ADL's:  Intact  Cognition: WNL  Sleep:  Fair   Screenings: GAD-7    Flowsheet Row Video Visit from 09/24/2021 in [facility] Office Visit from 04/04/2021 in Primary Care at [facility] Video Visit from 03/27/2021 in [facility] Telemedicine from 01/07/2021 in Primary Care at [facility] Office Visit from 12/11/2020 in Primary Care at [facility]  Total GAD-7 Score 15 11 11 6 13       PHQ2-9    Flowsheet Row Video Visit from 09/24/2021 in [facility] Office Visit from 08/06/2021 in Primary Care at [facility] Visit from 05/02/2021 in Primary  Care at [facility] Visit from 04/04/2021 in Primary Care at [facility] Video Visit from 03/27/2021 in [facility]  PHQ-2 Total Score 4 0 4 6 6   PHQ-9 Total Score 16 0 14 18 10       Flowsheet Row ED from 04/16/2021 in [facility] at [facility]  Admission (Discharged) from 12/24/2020 in [facility] Pre-Admission Testing 60 from 12/17/2020 in [facility]  C-SSRS RISK CATEGORY No Risk No Risk No Risk        Assessment and Plan: Patient notes that she has been more anxious and depressed since her last visit.  Today she is agreeable to increasing Zoloft 150 mg to 100 mg to help manage anxiety and depression.  She is also agreeable to increasing BuSpar 5 mg twice daily to 10 mg 3 times daily.  She will continue all other medications as prescribed  1. Generalized anxiety disorder  Increased - busPIRone (BUSPAR) 10 MG tablet; Take 1 tablet (10 mg total) by mouth 3 (three) times daily.  Dispense: 90 tablet; Refill: 3 Continue- hydrOXYzine (ATARAX) 50 MG tablet; TAKE 1 TABLET (50 MG TOTAL) BY MOUTH 3 (THREE) TIMES DAILY AS NEEDED FOR ANXIETY.  Dispense: 90 tablet; Refill: 3 Increase- sertraline (ZOLOFT) 100 MG tablet; Take 2 tablets (200 mg total) by mouth daily.  Dispense: 60 tablet; Refill: 3  2. Mild depression  Increase- sertraline (ZOLOFT) 100 MG tablet; Take 2 tablets (200 mg total) by mouth daily.  Dispense: 60 tablet; Refill: 3 Continue- QUEtiapine (SEROQUEL) 300 MG tablet; TAKE 1 TABLET(300 MG) BY MOUTH AT BEDTIME  Dispense: 30 tablet; Refill: 3    Follow-up in 3 months Follow-up with therapy  [place], NP 09/24/2021, 12:14 PM

## 2021-09-30 ENCOUNTER — Other Ambulatory Visit: Payer: Self-pay | Admitting: Family Medicine

## 2021-09-30 DIAGNOSIS — M87051 Idiopathic aseptic necrosis of right femur: Secondary | ICD-10-CM

## 2021-10-01 ENCOUNTER — Other Ambulatory Visit: Payer: Self-pay | Admitting: Family Medicine

## 2021-10-01 DIAGNOSIS — G629 Polyneuropathy, unspecified: Secondary | ICD-10-CM

## 2021-10-09 ENCOUNTER — Ambulatory Visit
Admission: RE | Admit: 2021-10-09 | Discharge: 2021-10-09 | Disposition: A | Discharge status: Home / Self Care | Payer: Medicaid Other | Source: Ambulatory Visit | Attending: Orthopedic Surgery | Admitting: Orthopedic Surgery

## 2021-10-09 ENCOUNTER — Other Ambulatory Visit: Payer: Self-pay

## 2021-10-09 DIAGNOSIS — Z96649 Presence of unspecified artificial hip joint: Secondary | ICD-10-CM

## 2021-10-09 IMAGING — MR MR HIP*L* W/O CM
5 series · 35 of 40 positions shown · non-contrast
Comparison: Pelvis and right hip radiographs [DATE]; MRI pelvis
and right hip [DATE]

CLINICAL DATA: Left hip pain for several months. History of right
hip replacement.

EXAM:
MR OF THE LEFT HIP WITHOUT CONTRAST
TECHNIQUE: Multiplanar, multisequence MR imaging was performed. No intravenous
contrast was administered.

[Series 5: T2 fat-sat · coronal · left · 3.5mm · 1.04mm/px · 8 of 44 slices shown (1 of 2)]
[im 1/44]
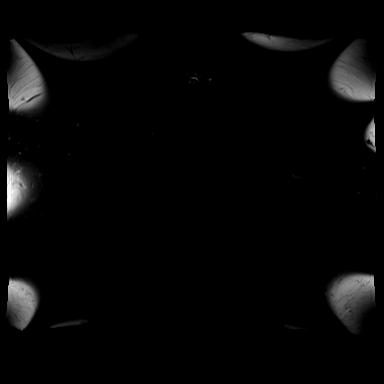
[im 7/44]
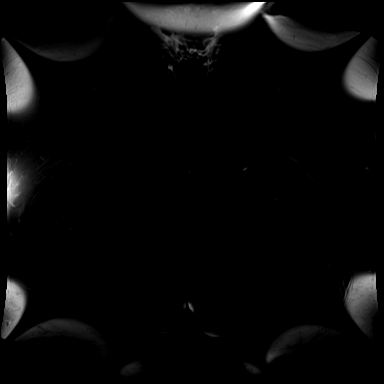
[im 13/44]
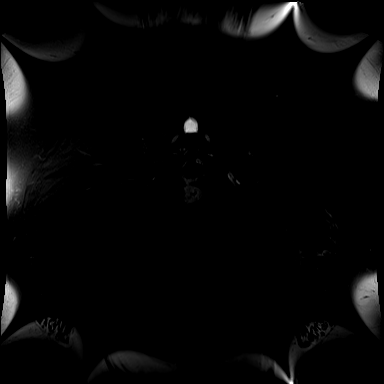
[im 19/44]
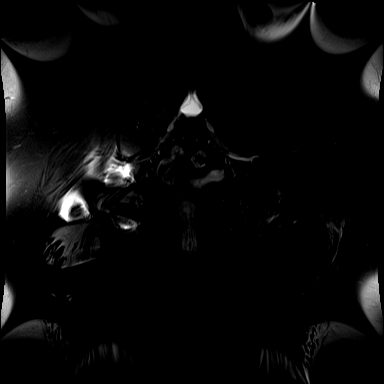
[im 25/44]
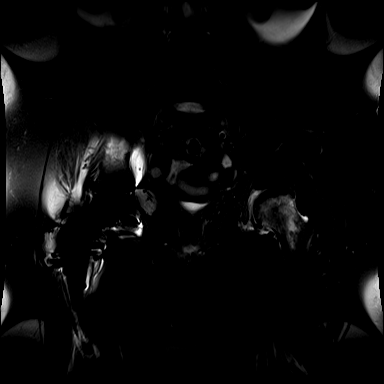
[im 31/44]
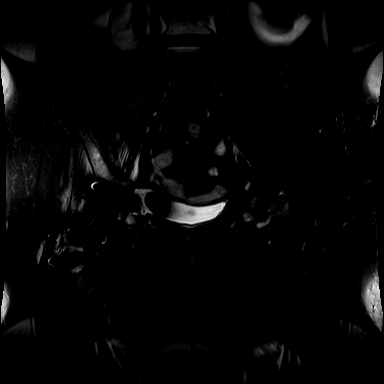
[im 37/44]
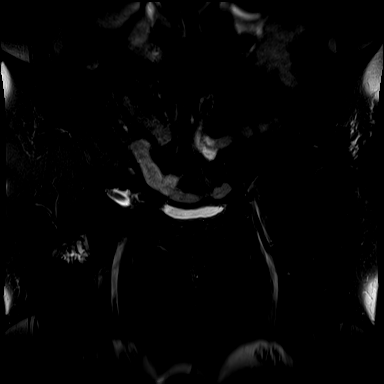
[im 44/44]
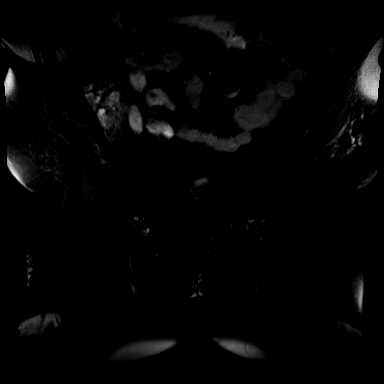

[Series 6: T1 · coronal · left · 3.5mm · 0.89mm/px · 4 of 44 slices shown]
[im 1/44]
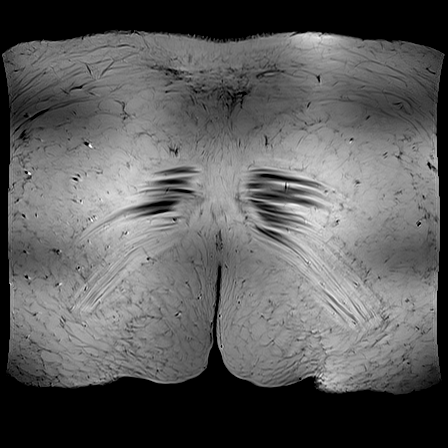
[im 6/44]
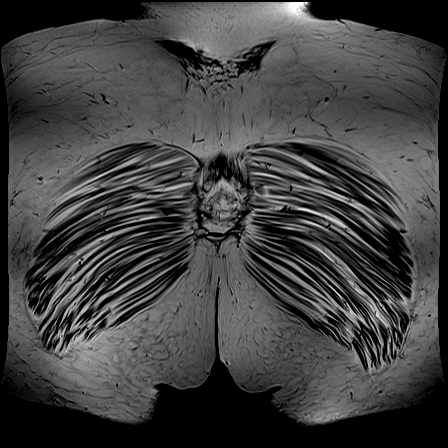
[im 11/44]
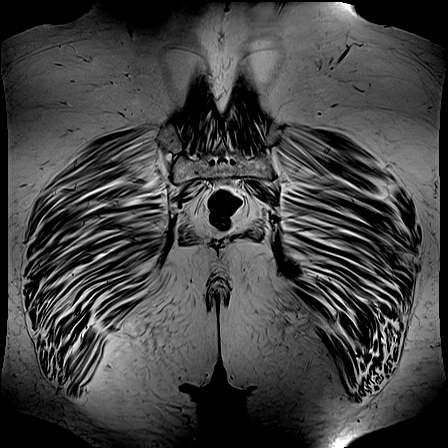
[im 17/44]
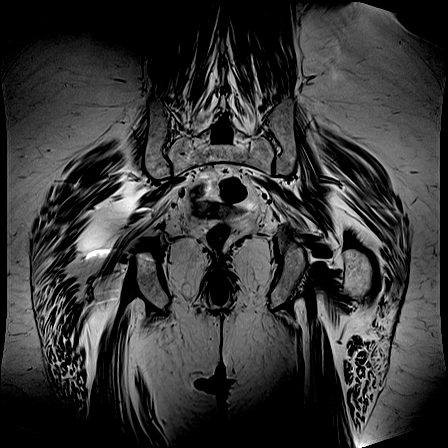

[Series 7: T2 fat-sat · axial · left · 3.5mm · 1.19mm/px · z∈[-178,+48]mm · 11 of 55 slices shown (2 of 2)]
[im 1/55]
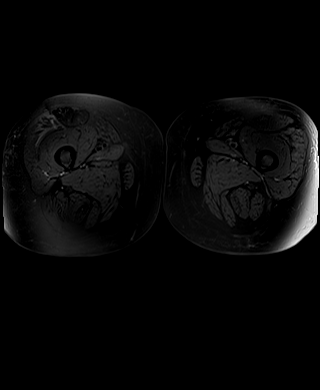
[im 6/55]
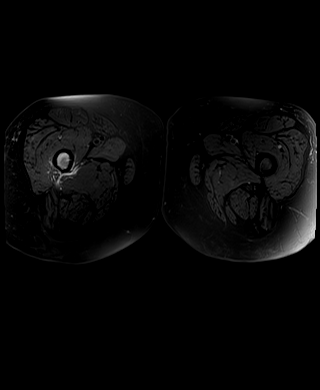
[im 11/55]
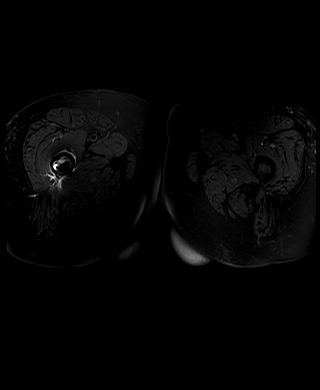
[im 17/55]
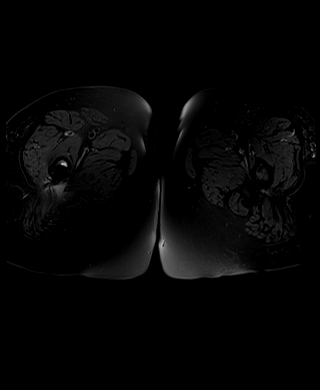
[im 22/55]
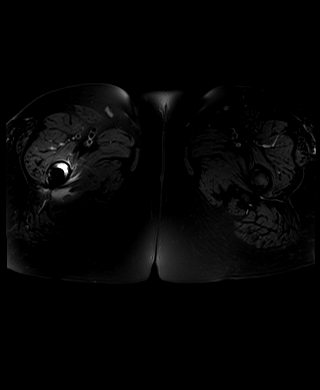
[im 28/55]
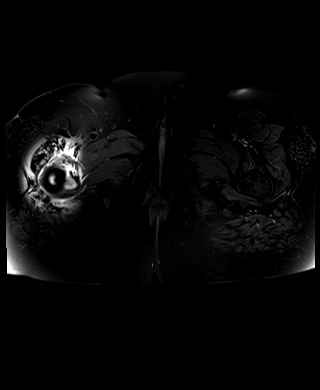
[im 33/55]
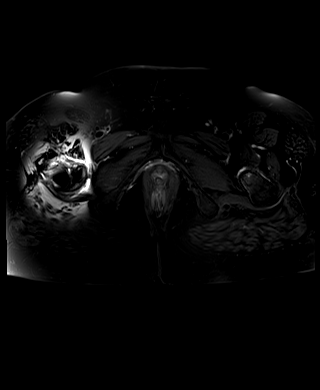
[im 38/55]
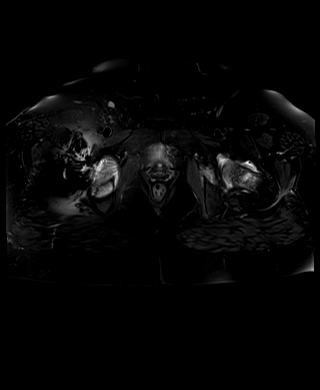
[im 44/55]
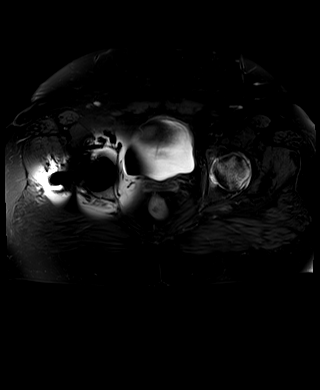
[im 49/55]
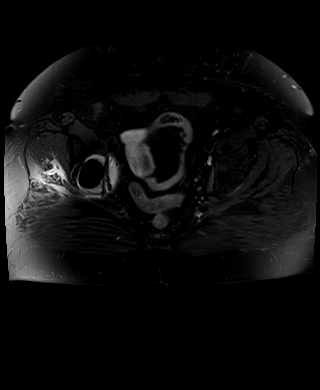
[im 55/55]
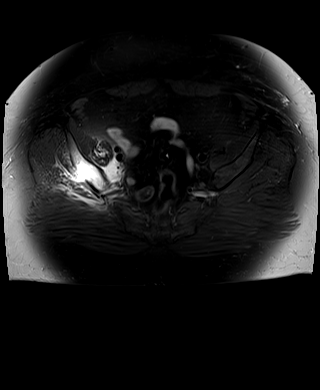

[Series 8: PD fat-sat · sagittal · left · 3.0mm · 0.56mm/px · 6 of 33 slices shown (1 of 2)]
[im 1/33]
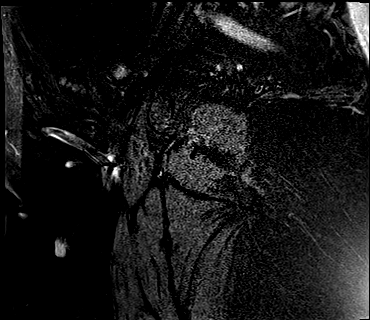
[im 7/33]
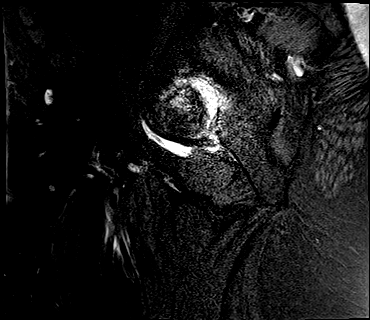
[im 13/33]
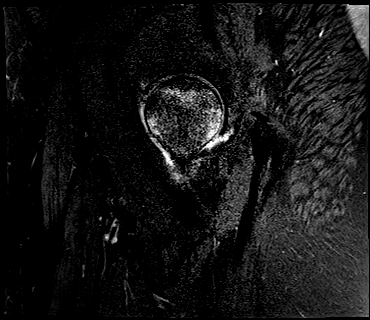
[im 20/33]
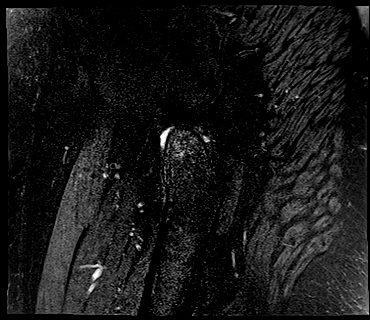
[im 26/33]
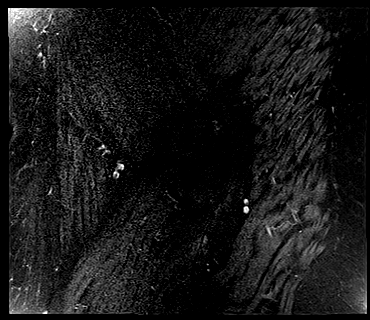
[im 33/33]
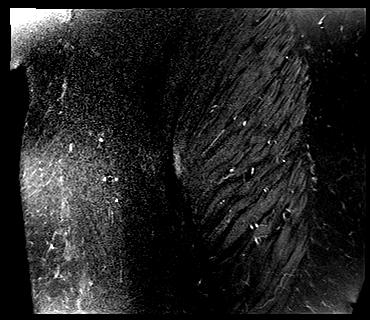

[Series 9: PD fat-sat · coronal · left · 3.0mm · 0.59mm/px · 6 of 30 slices shown (2 of 2)]
[im 1/30]
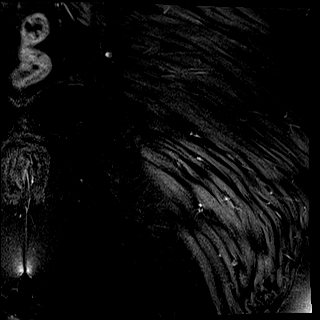
[im 6/30]
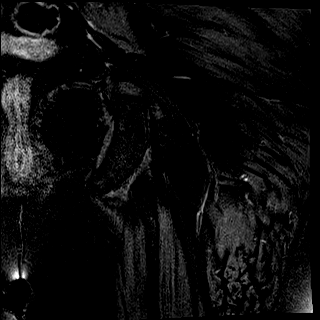
[im 12/30]
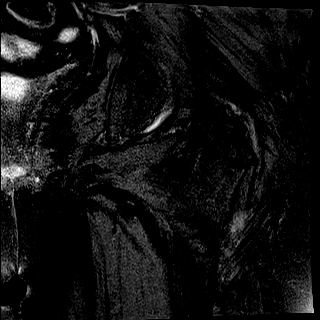
[im 18/30]
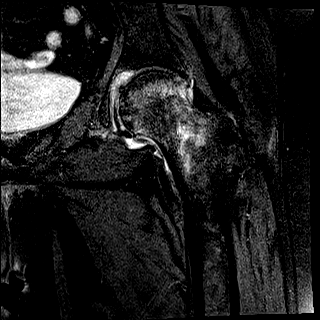
[im 24/30]
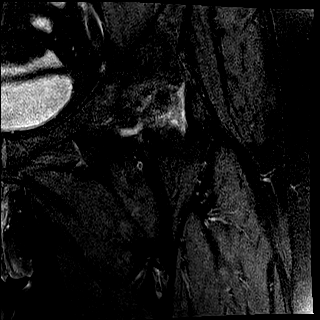
[im 30/30]
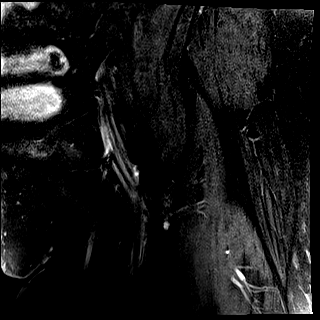

[35 of 40 positions shown; findings below may reference images not displayed]

FINDINGS: Bones: There is metallic artifact from interval total right hip
arthroplasty hardware treating the moderately advanced right
avascular necrosis seen on prior MRI.

Prior MRI there was serpiginous signal indicating avascular necrosis
without associated acute marrow edema or cortical collapse. On the
current MRI there is moderate to high-grade marrow edema throughout
the superomedial aspect of the left femoral head and extending
throughout the superior left femoral neck there is also moderate new
marrow edema within the anterior medial left acetabulum. There is
new very mild approximate 2 mm cortical depression within the
anterior superior left femoral head (coronal series 6, image 27).

Articular cartilage and labrum

Articular cartilage: Moderate to high-grade thinning of the anterior
superior left femoral head and acetabular cartilage.

Labrum: There is again linear fluid bright signal within the
anterior superior left acetabular chondrolabral junction a
degenerative tear (sagittal series 8, image 12).

Joint or bursal effusion

Joint effusion:  Mild left hip joint effusion.

Bursae: Trace left trochanteric bursitis. Region of the mild right
trochanteric bursitis seen on prior MRIs obscured by total right hip
arthroplasty metal artifact.

Muscles and tendons

Muscles and tendons: The origins of the bilateral rectus femoris
tendons and the bilateral common hamstring tendon origins are
intact. Mild left gluteus minimus insertional tendinosis. The left
gluteus medius tendon insertion is intact. The left iliopsoas tendon
insertion is intact. The right gluteus minimus, gluteus medius, and
iliopsoas tendons are obscured by metallic artifact.

Other findings

Miscellaneous:   The uterus is again surgically absent.
IMPRESSION: :
IMPRESSION: 1. Interval total right hip arthroplasty.
2. Progression of the prior left femoral head avascular necrosis now
with acute marrow edema and new minimal anterior superior left
femoral head cortical depression.
3. Moderate to high-grade thinning of the anterior superior left
femoral head and acetabular cartilage. Unchanged degenerative
chondrolabral junction tear of the anterior superior left acetabular
labrum.
4. Mild left hip joint effusion.
5. Trace left trochanteric bursitis.

## 2021-10-13 ENCOUNTER — Other Ambulatory Visit: Payer: Medicaid Other

## 2021-10-24 ENCOUNTER — Other Ambulatory Visit: Payer: Self-pay | Admitting: Orthopedic Surgery

## 2021-10-25 ENCOUNTER — Telehealth (HOSPITAL_COMMUNITY): Payer: Self-pay | Admitting: Psychiatry

## 2021-10-25 NOTE — Telephone Encounter (Signed)
Received incoming call from [facility] [phone] Fax [phone].  They are requesting mental health clearance for pt to proceed for surgery.  Called them back and was routed to a hold que. Unable to let them know ppt provider is out on FMLA for maternity leave. Last saw Dr. Toy Cookey on 09/24/21, 07/02/21, 03/27/21. RX Prescribed at [facility]: BUSPAR 10 MG 3 TIMES A DAY,  ATARAX 50 MG 3 TIMES A DAY ZOLOFT  100 MG TAKE 2 TABLETS A DAY, SEROQUEL 300 MG TAKE 1 TABLET AT BEDTIME.

## 2021-10-28 NOTE — Telephone Encounter (Signed)
Medication manageement - Telephone call with patient, after discussing with Dr. Lucianne Muss to obtain consent from patient to call her orthopedic ofifce back, Dr. Turner Daniels with [facility] to let them know Dr. Lucianne Muss had no concerns about her moving forward with plans for upcoming orthopedic surgery.  Informed patient Dr. Lucianne Muss, reviewed her record due to Toy Cookey, patient's nurse practitioner being out at this time and patient requested we please call her orthopedic provider's office to inform she could move forward with plans.  Spoke to [place], Scientist, physiological at [employer], who agreed to send psych clearance to patient's provider Dr. Turner Daniels so they could move forward with scheduled plans for surgery.  Collateral to have their office call back if anything else needed.   ?

## 2021-10-29 ENCOUNTER — Other Ambulatory Visit: Payer: Self-pay | Admitting: Family Medicine

## 2021-10-29 NOTE — Progress Notes (Signed)
DUE TO COVID-19 ONLY  TWO  VISITOR  ARE  ALLOWED TO COME WITH YOU AND STAY IN THE WAITING ROOM ONLY DURING PRE OP AND PROCEDURE DAY OF SURGERY.  4  VISITOR  MAY VISIT WITH YOU AFTER SURGERY IN YOUR PRIVATE ROOM DURING VISITING HOURS ONLY! ?YOU MAY HAVE ONE PERSON SPEND THE NITE WITH YOU IN YOUR ROOM AFTER SURGERY.   ? ?Yprocedure is scheduled on:  ?   11/11/2021.  ? [facility] ? ? Report to admitting at      0615AM  ?DO NOT BRING INSURANCE CARD, PICTURE ID OR WALLET DAY OF SURGERY.  ?  ? ? Call this number if you have problems the morning of surgery [phone]  ? ? REMEMBER: NO  SOLID FOODS , CANDY, GUM OR MINTS AFTER MIDNITE THE NITE BEFORE SURGERY .       Marland Kitchen. CLEAR LIQUIDS UNTIL   0600             DAY OF SURGERY.      PLEASE FINISH ENSURE DRINK PER SURGEON ORDER  WHICH NEEDS TO BE COMPLETED AT  0600AM        MORNING OF SURGERY.   ? ? ? ? ?CLEAR LIQUID DIET ? ? ?Foods Allowed      ?WATER ?BLACK COFFEE ( SUGAR OK, NO MILK, CREAM OR CREAMER) REGULAR AND DECAF  ?TEA ( SUGAR OK NO MILK, CREAM, OR CREAMER) REGULAR AND DECAF  ?PLAIN JELLO ( NO RED)  ?FRUIT ICES ( NO RED, NO FRUIT PULP)  ?POPSICLES ( NO RED)  ?JUICE- APPLE, WHITE GRAPE AND WHITE CRANBERRY  ?SPORT DRINK LIKE GATORADE ( NO RED)  ?CLEAR BROTH ( VEGETABLE , CHICKEN OR BEEF)                                                               ? ?    ? ?BRUSH YOUR TEETH MORNING OF SURGERY AND RINSE YOUR MOUTH OUT, NO CHEWING GUM CANDY OR MINTS. ?  ? ? Take these medicines the morning of surgery with A SIP OF WATER:  AMLODIPINE, INHALERS AS USUAL AND BRING, BUSPAR, GABAPENTIN, PROTONIX, ZOLOFT  ? ? ?DO NOT TAKE ANY DIABETIC MEDICATIONS DAY OF YOUR SURGERY ?                  ?            You may not have any metal on your body including hair pins and  ?            piercings  Do not wear jewelry, make-up, lotions, powders or perfumes, deodorant ?            Do not wear nail polish on your fingernails.   ?           IF YOU ARE A FEMALE  AND WANT TO SHAVE UNDER ARMS OR LEGS PRIOR TO SURGERY YOU MUST DO SO AT LEAST 48 HOURS PRIOR TO SURGERY.  ?            Men may shave face and neck. ? ? Do not bring valuables to the hospital. [facility] IS NOT ?            RESPONSIBLE   FOR  VALUABLES. ? Contacts, dentures or bridgework may not be worn into surgery. ? Leave suitcase in the car. After surgery it may be brought to your room. ? ?  ? Patients discharged the day of surgery will not be allowed to drive home. IF YOU ARE HAVING SURGERY AND GOING HOME THE SAME DAY, YOU MUST HAVE AN ADULT TO DRIVE YOU HOME AND BE WITH YOU FOR 24 HOURS. YOU MAY GO HOME BY TAXI OR UBER OR ORTHERWISE, BUT AN ADULT MUST ACCOMPANY YOU HOME AND STAY WITH YOU FOR 24 HOURS. ?  ? ?            Please read over the following fact sheets you were given: ?_____________________________________________________________________ ? ?[facility] - Preparing for Surgery ?Before surgery, you can play an important role.  Because skin is not sterile, your skin needs to be as free of germs as possible.  You can reduce the number of germs on your skin by washing with CHG (chlorahexidine gluconate) soap before surgery.  CHG is an antiseptic cleaner which kills germs and bonds with the skin to continue killing germs even after washing. ?Please DO NOT use if you have an allergy to CHG or antibacterial soaps.  If your skin becomes reddened/irritated stop using the CHG and inform your nurse when you arrive at Short Stay. ?Do not shave (including legs and underarms) for at least 48 hours prior to the first CHG shower.  You may shave your face/neck. ?Please follow these instructions carefully: ? 1.  Shower with CHG Soap the night before surgery and the  morning of Surgery. ? 2.  If you choose to wash your hair, wash your hair first as usual with your  normal  shampoo. ? 3.  After you shampoo, rinse your hair and body thoroughly to remove the  shampoo.                           4.  Use CHG as you would any  other liquid soap.  You can apply chg directly  to the skin and wash  ?                     Gently with a scrungie or clean washcloth. ? 5.  Apply the CHG Soap to your body ONLY FROM THE NECK DOWN.   Do not use on face/ open      ?                     Wound or open sores. Avoid contact with eyes, ears mouth and genitals (private parts).  ?                     Engineering geologist,  Genitals (private parts) with your normal soap. ?            6.  Wash thoroughly, paying special attention to the area where your surgery  will be performed. ? 7.  Thoroughly rinse your body with warm water from the neck down. ? 8.  DO NOT shower/wash with your normal soap after using and rinsing off  the CHG Soap. ?               9.  Pat yourself dry with a clean towel. ?           10.  Wear clean pajamas. ?  11.  Place clean sheets on your bed the night of your first shower and do not  sleep with pets. ?Day of Surgery : ?Do not apply any lotions/deodorants the morning of surgery.  Please wear clean clothes to the hospital/surgery center. ? ?FAILURE TO FOLLOW THESE INSTRUCTIONS MAY RESULT IN THE CANCELLATION OF YOUR SURGERY ?PATIENT SIGNATURE_________________________________ ? ?NURSE SIGNATURE__________________________________ ? ?________________________________________________________________________  ? ? ?           ? ? ? ?    ?

## 2021-10-29 NOTE — Progress Notes (Addendum)
Anesthesia Review: ? ?PCP: [name]- LOV 10/31/21 for clearance for surgery.   ?Cardiologist : none  ?Chest x-ray : 10/31/21  ?EKG : 12/31/20  ?Lower Vascular- 12/31/20  ?Echo : 01/01/21  ?Stress test: ?Cardiac Cath :  ?Activity level: can do a flgiht of stairs wtihout difficulty  ?Sleep Study/ CPAP : none  ?Fasting Blood Sugar :      / Checks Blood Sugar -- times a day:   ?Blood Thinner/ Instructions /Last Dose: ?ASA / Instructions/ Last Dose :  ?81 mg aspirin   ?Has COPD- uses 4L of oxygen at nite per pt  ?PT states she has been treated for approx last month for sinus infection.  Had one round of antibiotics and is currently on 2nd round of antibiotics by [name] ( ENT)  L OV 10/03/21- in epic.  PT states she has approximately 2 pills of Doxycycline left to complete.  PT denies any covid symptoms at time of preop visit.  PT did have slight cough  x 2 at preop which she states is due to sinus infection.  Temp 99.1 at preop.  Instructed pt to wear a mask at preop visit which she complied with .  [name] made aware.   ?CBC done 10/31/21 routed to Julie rowan - WC- 12.3 ?

## 2021-10-31 ENCOUNTER — Encounter (HOSPITAL_COMMUNITY)
Admission: RE | Admit: 2021-10-31 | Discharge: 2021-10-31 | Disposition: A | Discharge status: Home / Self Care | Payer: Medicaid Other | Source: Ambulatory Visit | Attending: Orthopedic Surgery | Admitting: Orthopedic Surgery

## 2021-10-31 ENCOUNTER — Ambulatory Visit (HOSPITAL_COMMUNITY)
Admission: RE | Admit: 2021-10-31 | Discharge: 2021-10-31 | Disposition: A | Discharge status: Home / Self Care | Payer: Medicaid Other | Source: Ambulatory Visit | Attending: Orthopedic Surgery | Admitting: Orthopedic Surgery

## 2021-10-31 ENCOUNTER — Encounter (HOSPITAL_COMMUNITY): Payer: Self-pay

## 2021-10-31 ENCOUNTER — Other Ambulatory Visit: Payer: Self-pay

## 2021-10-31 ENCOUNTER — Ambulatory Visit (INDEPENDENT_AMBULATORY_CARE_PROVIDER_SITE_OTHER): Payer: Medicaid Other | Admitting: Family Medicine

## 2021-10-31 ENCOUNTER — Encounter: Payer: Self-pay | Admitting: Family Medicine

## 2021-10-31 VITALS — BP 146/84 | HR 91 | Temp 98.0°F | Resp 18 | Ht 62.0 in | Wt 217.8 lb

## 2021-10-31 DIAGNOSIS — I10 Essential (primary) hypertension: Secondary | ICD-10-CM | POA: Diagnosis not present

## 2021-10-31 DIAGNOSIS — Z01818 Encounter for other preprocedural examination: Secondary | ICD-10-CM | POA: Insufficient documentation

## 2021-10-31 LAB — CBC
HCT: 39.6 % (ref 36.0–46.0)
Hemoglobin: 12.8 g/dL (ref 12.0–15.0)
MCH: 29.8 pg (ref 26.0–34.0)
MCHC: 32.3 g/dL (ref 30.0–36.0)
MCV: 92.1 fL (ref 80.0–100.0)
Platelets: 342 10*3/uL (ref 150–400)
RBC: 4.3 MIL/uL (ref 3.87–5.11)
RDW: 13.4 % (ref 11.5–15.5)
WBC: 12.3 10*3/uL — ABNORMAL HIGH (ref 4.0–10.5)
nRBC: 0 % (ref 0.0–0.2)

## 2021-10-31 LAB — TYPE AND SCREEN
ABO/RH(D): O POS
Antibody Screen: NEGATIVE

## 2021-10-31 LAB — BASIC METABOLIC PANEL
Anion gap: 10 (ref 5–15)
BUN: 10 mg/dL (ref 8–23)
CO2: 27 mmol/L (ref 22–32)
Calcium: 9.4 mg/dL (ref 8.9–10.3)
Chloride: 98 mmol/L (ref 98–111)
Creatinine, Ser: 0.66 mg/dL (ref 0.44–1.00)
GFR, Estimated: >60 mL/min (ref >60)
Glucose, Bld: 146 mg/dL — ABNORMAL HIGH (ref 70–99)
Potassium: 4 mmol/L (ref 3.5–5.1)
Sodium: 135 mmol/L (ref 135–145)

## 2021-10-31 LAB — SURGICAL PCR SCREEN
MRSA, PCR: NEGATIVE
Staphylococcus aureus: NEGATIVE

## 2021-10-31 IMAGING — DX DG CHEST 2V
2 series · 2 of 2 positions shown · non-contrast
Comparison: [DATE]

CLINICAL DATA: Chest congestion

EXAM:
CHEST - 2 VIEW

[chest pa]
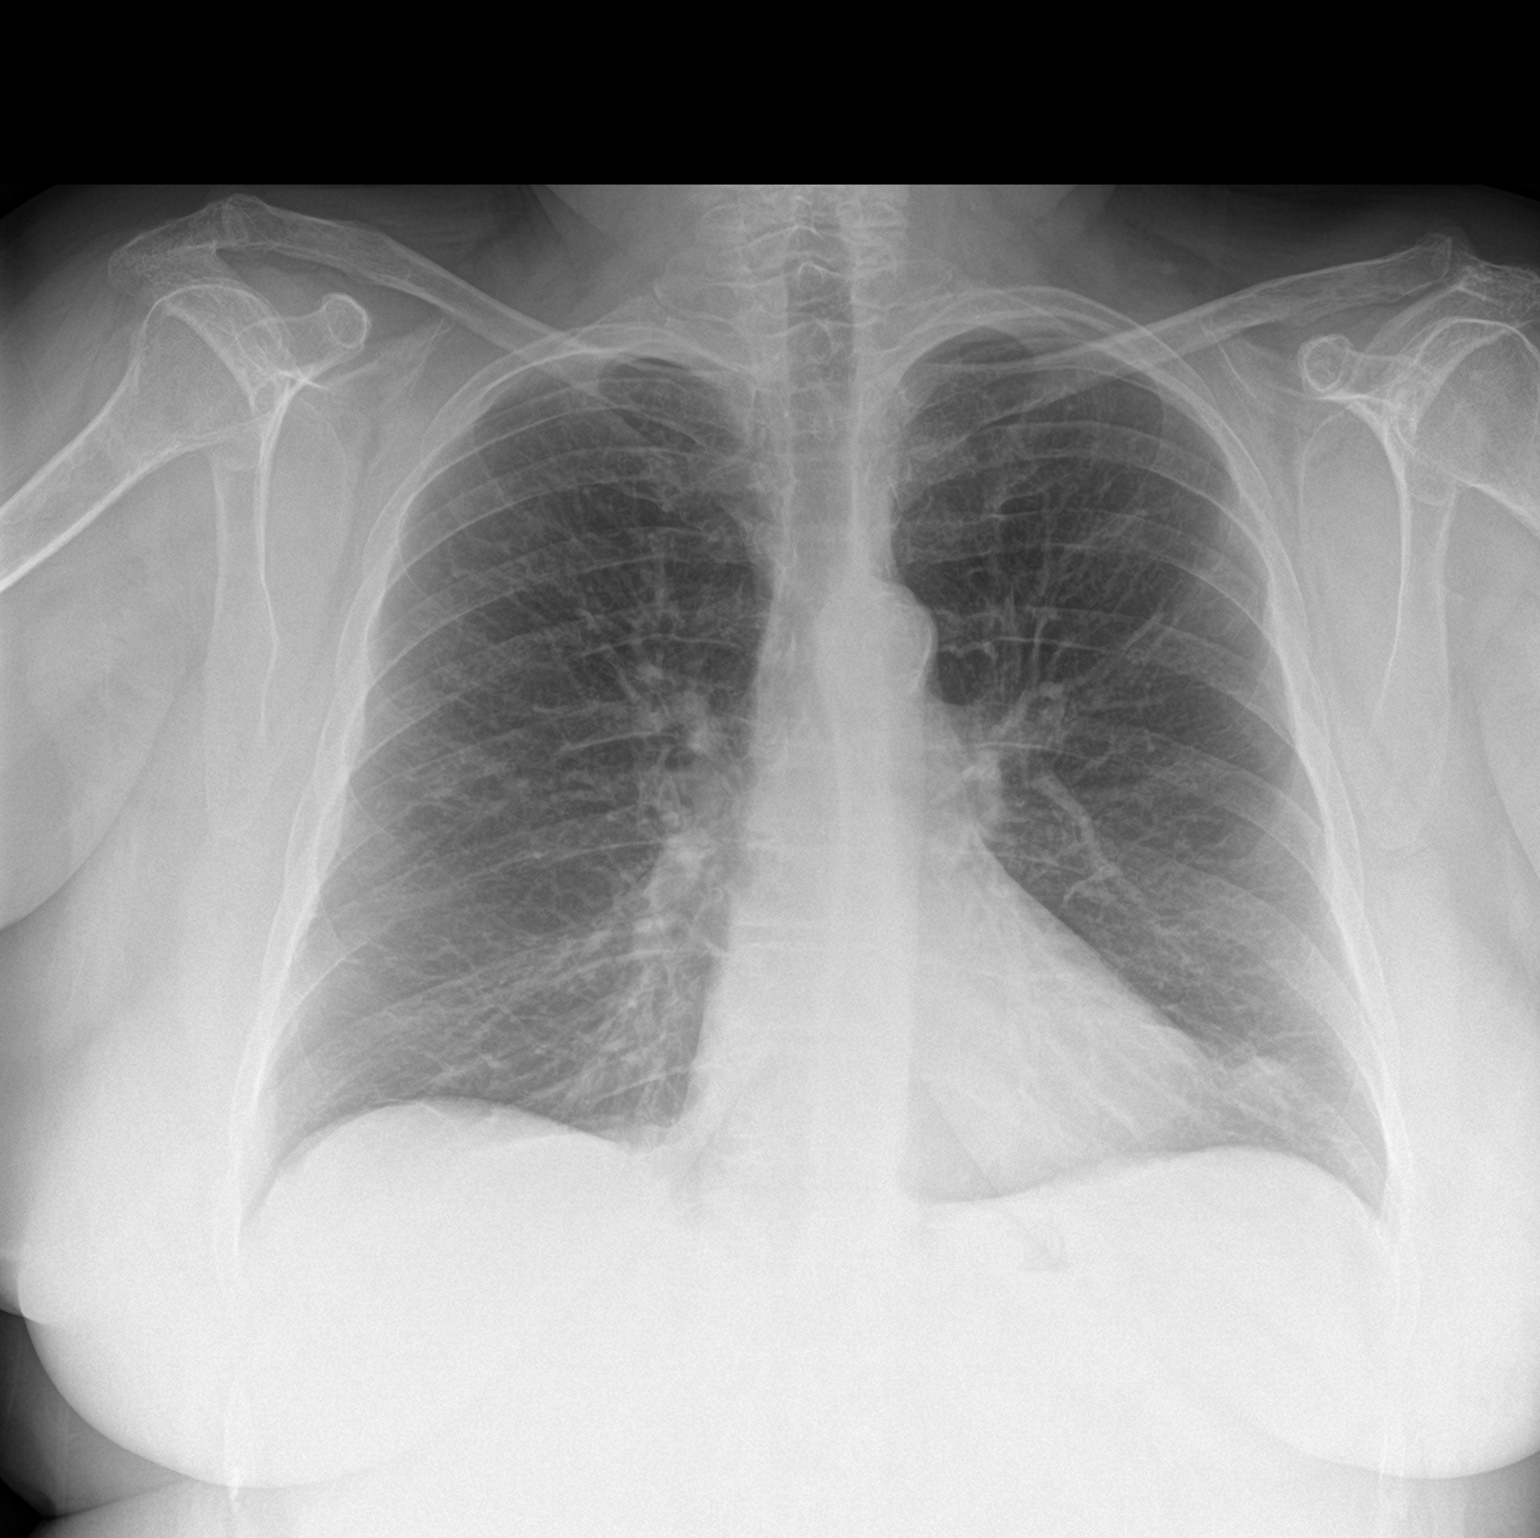

[chest lat]
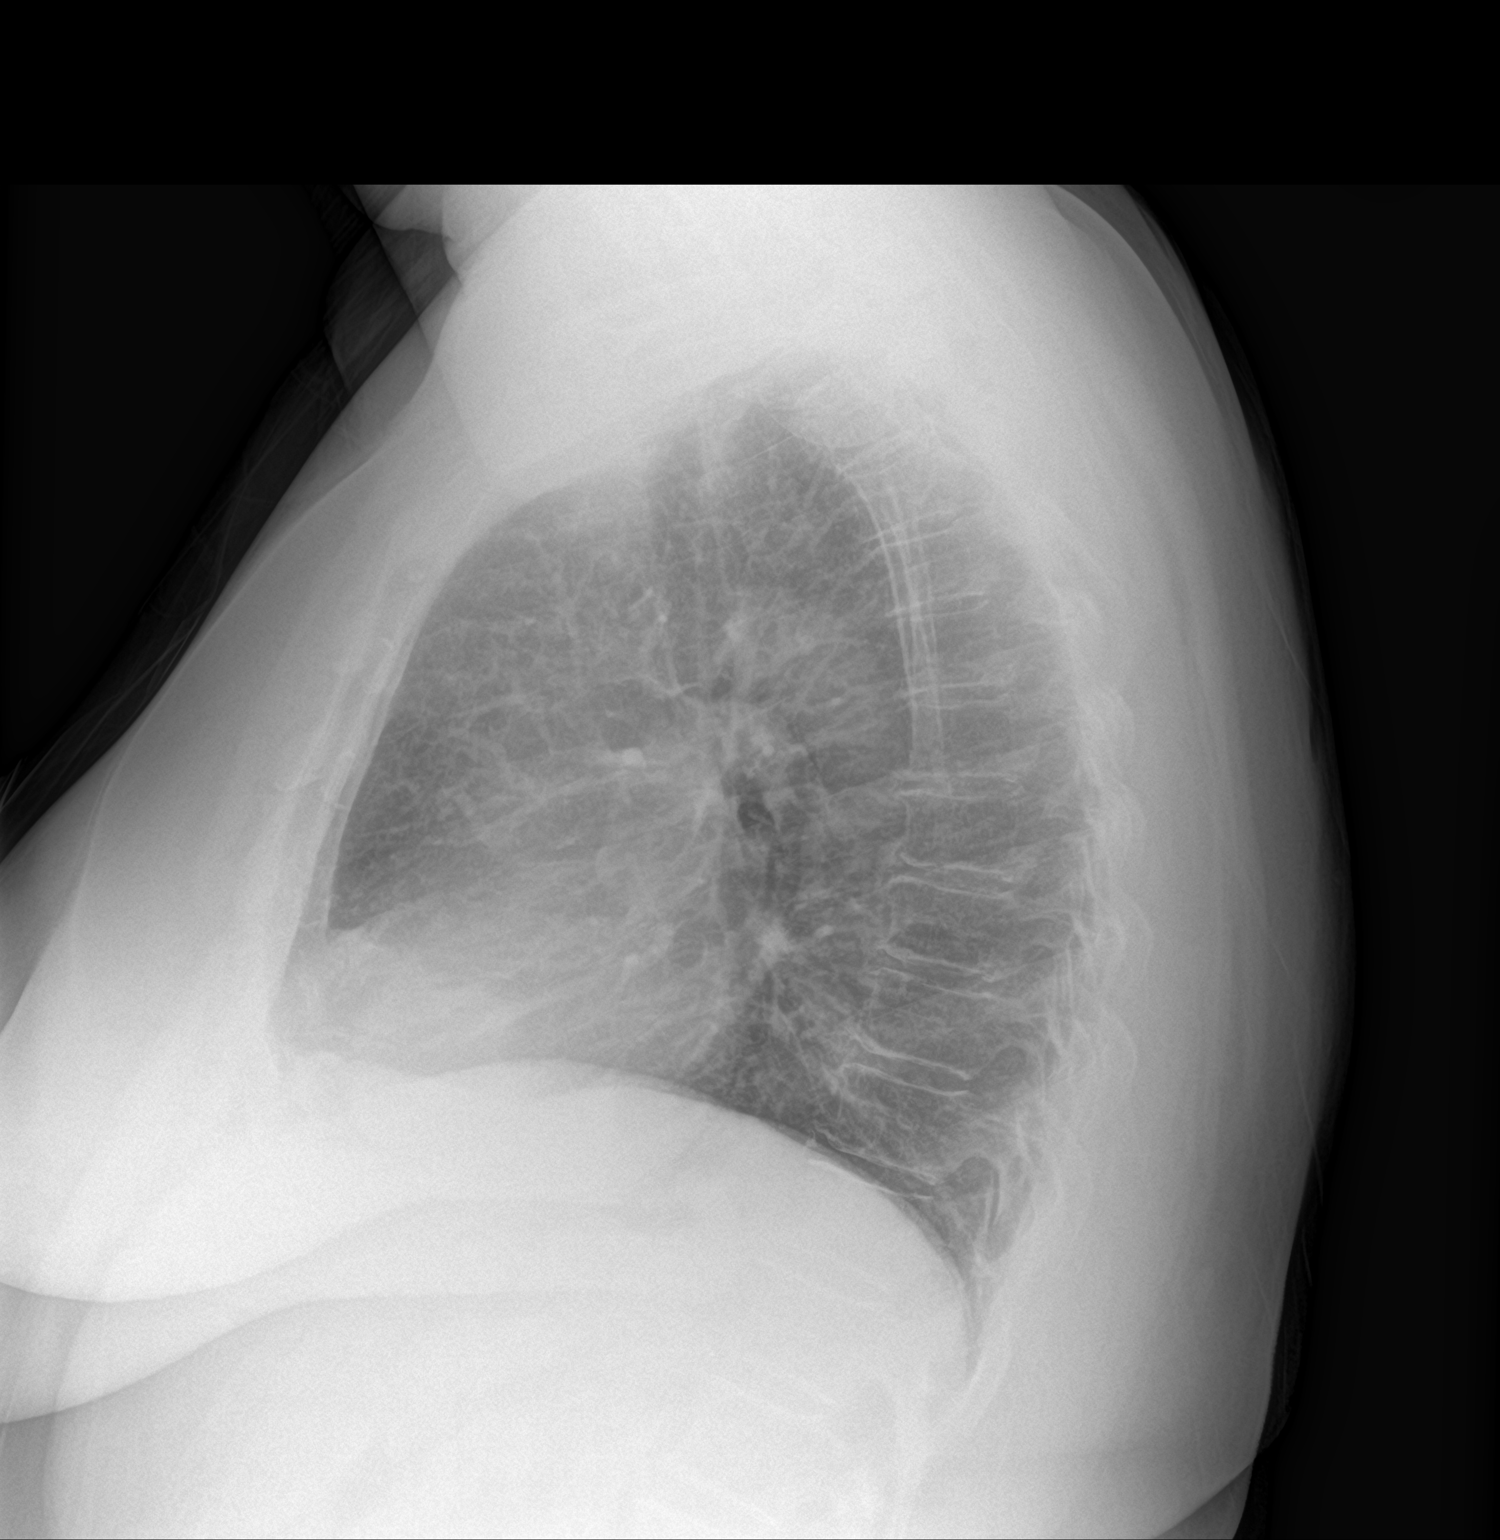

[2 of 2 positions shown; findings below may reference images not displayed]

FINDINGS: Mild left basilar atelectasis or scarring. Lungs are otherwise
clear. No pneumothorax or pleural effusion. Cardiac size within
normal limits. No acute bone abnormality.
IMPRESSION: No radiographic evidence of acute cardiopulmonary disease.

## 2021-10-31 MED ORDER — LOSARTAN POTASSIUM 100 MG PO TABS: 100.0000 mg | ORAL_TABLET | Freq: Every day | ORAL | 0 refills | Status: DC

## 2021-10-31 NOTE — Progress Notes (Signed)
Patient is here today for medical clearance for hip surgery on 04/10/52023. ? ?Patient would like to have refills before sx ?Patient is taking and ABX for a sinus infection that she has. ?Patient given a handicap  place card form ?

## 2021-10-31 NOTE — Progress Notes (Signed)
? ?Established Patient Office Visit ? ?Subjective:  ?Patient ID: [name], female    DOB: 06-Nov-1960  Age: 61 y.o. MRN: [id number] ? ?CC:  ?Chief Complaint  ?Patient presents with  ? surgical clearance  ? ? ?HPI ?[name] presents for presurgical med evaluation as requested by Dr Dyanne Carrel for left hip replacement surgery scheduled for 11/11/2021. Patient denies acute complaints.  ? ?Past Medical History:  ?Diagnosis Date  ? Anxiety   ? Arthritis   ? COPD (chronic obstructive pulmonary disease) (HCC)   ? Depression   ? Dyspnea   ? GERD (gastroesophageal reflux disease)   ? Hypertension   ? Peripheral edema   ? 2+ at PAT visit  ? ? ?Past Surgical History:  ?Procedure Laterality Date  ? ABDOMINAL HYSTERECTOMY  1986  ? CARPAL TUNNEL RELEASE Bilateral 1980  ? Per patient  ? SHOULDER SURGERY Left 1980  ? 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  ? Procedure: RIGHT TOTAL HIP ARTHROPLASTY ANTERIOR APPROACH;  Surgeon: Gean Birchwood, MD;  Location: [facility];  Service: Orthopedics;  Laterality: Right;  ? ? ?Family History  ?Problem Relation Age of Onset  ? Ulcers Father   ? Hypotension Sister   ? Hypertension Brother   ? ? ?Social History  ? ?Socioeconomic History  ? Marital status: Single  ?  Spouse name: Not on file  ? Number of children: 0  ? Years of education: 33  ? Highest education level: GED or equivalent  ?Occupational History  ? Occupation: Armed forces operational officer  ?Tobacco Use  ? Smoking status: Every Day  ?  Packs/day: 1.00  ?  Years: 25.00  ?  Pack years: 25.00  ?  Types: Cigarettes  ?  Start date: 08/04/1978  ? Smokeless tobacco: Never  ?Vaping Use  ? Vaping Use: Never used  ?Substance and Sexual Activity  ? Alcohol use: Not Currently  ? Drug use: Never  ? Sexual activity: Never  ?Other Topics Concern  ? Not on file  ?Social History Narrative  ? Lives and step-dad, they are able to help.  ? ?Social Determinants of Health  ? ?Financial Resource Strain: Not on file  ?Food Insecurity: Not on file  ?Transportation Needs:  Not on file  ?Physical Activity: Not on file  ?Stress: Not on file  ?Social Connections: Not on file  ?Intimate Partner Violence: Not on file  ? ? ?ROS ?Review of Systems  ?All other systems reviewed and are negative. ? ?Objective:  ? ?Today's Vitals: BP (!) 146/84   Pulse 91   Temp 98 ?F (36.7 ?C) (Oral)   Resp 18   Ht 5\' 2"  (1.575 m)   Wt 217 lb 12.8 oz (98.8 kg)   SpO2 94%   BMI 39.84 kg/m?  ? ?Physical Exam ?Vitals and nursing note reviewed.  ?Constitutional:   ?   General: She is not in acute distress. ?   Appearance: She is obese.  ?Cardiovascular:  ?   Rate and Rhythm: Normal rate and regular rhythm.  ?Pulmonary:  ?   Effort: Pulmonary effort is normal.  ?   Breath sounds: Normal breath sounds.  ?Abdominal:  ?   Palpations: Abdomen is soft.  ?   Tenderness: There is no abdominal tenderness.  ?Musculoskeletal:  ?   Left hip: Tenderness present. No deformity. Decreased range of motion.  ?   Right lower leg: No edema.  ?   Left lower leg: No edema.  ?   Comments: Patient utilizing cane  ?Neurological:  ?  General: No focal deficit present.  ?   Mental Status: She is alert and oriented to person, place, and time.  ? ? ?Assessment & Plan:  ? ?1. Pre-op evaluation ?Patient to have labs drawn later today. Appears medically stable to undergo the stated procedure ? ?2. Essential hypertension ?Slightly elevated readings. Increased losartan from 50 mg to 100mg  daily to better optimize the blood pressure for the upcoming procedure.  ? ? ?Outpatient Encounter Medications as of 10/31/2021  ?Medication Sig  ? amLODipine (NORVASC) 10 MG tablet TAKE 1 TABLET(10 MG) BY MOUTH DAILY  ? aspirin EC 81 MG tablet Take 1 tablet (81 mg total) by mouth 2 (two) times daily.  ? benzonatate (TESSALON) 100 MG capsule Take 1 capsule (100 mg total) by mouth 2 (two) times daily as needed for cough.  ? budesonide-formoterol (SYMBICORT) 160-4.5 MCG/ACT inhaler INHALE 2 PUFFS INTO THE LUNGS TWICE DAILY  ? busPIRone (BUSPAR) 10 MG tablet  Take 1 tablet (10 mg total) by mouth 3 (three) times daily.  ? doxycycline (VIBRAMYCIN) 100 MG capsule Take 100 mg by mouth 2 (two) times daily. 7 days  ? folic acid (FOLVITE) 1 MG tablet TAKE 1 TABLET(1 MG) BY MOUTH DAILY  ? gabapentin (NEURONTIN) 300 MG capsule TAKE 1 CAPSULE(300 MG) BY MOUTH THREE TIMES DAILY  ? hydrOXYzine (ATARAX) 50 MG tablet TAKE 1 TABLET (50 MG TOTAL) BY MOUTH 3 (THREE) TIMES DAILY AS NEEDED FOR ANXIETY.  ? ibuprofen (ADVIL) 200 MG tablet Take 600 mg by mouth every 6 (six) hours as needed for moderate pain.  ? losartan (COZAAR) 50 MG tablet Take 1 tablet (50 mg total) by mouth daily.  ? Menthol (ICY HOT) 5 % PTCH Apply 1 patch topically daily as needed (hip pain).  ? Multiple Vitamin (MULTIVITAMIN WITH MINERALS) TABS tablet Take 1 tablet by mouth daily.  ? oxybutynin (DITROPAN XL) 15 MG 24 hr tablet Take 1 tablet (15 mg total) by mouth at bedtime.  ? OXYGEN Inhale 4 L into the lungs at bedtime.  ? pantoprazole (PROTONIX) 40 MG tablet Take 1 tablet (40 mg total) by mouth 2 (two) times daily.  ? POTASSIUM PO Take 1 tablet by mouth daily.  ? QUEtiapine (SEROQUEL) 300 MG tablet TAKE 1 TABLET(300 MG) BY MOUTH AT BEDTIME  ? sertraline (ZOLOFT) 100 MG tablet Take 2 tablets (200 mg total) by mouth daily.  ? traMADol (ULTRAM) 50 MG tablet Take 50 mg by mouth every 6 (six) hours as needed for severe pain.  ? VITAMIN D PO Take 1 capsule by mouth daily.  ? atorvastatin (LIPITOR) 40 MG tablet Take 1 tablet (40 mg total) by mouth daily.  ? doxycycline (VIBRAMYCIN) 100 MG capsule TAKE 1 CAPSULE(100 MG) BY MOUTH TWICE DAILY FOR 7 DAYS (Patient not taking: Reported on 10/31/2021)  ? fluticasone furoate-vilanterol (BREO ELLIPTA) 100-25 MCG/INH AEPB Inhale 1 puff into the lungs daily. (Patient not taking: Reported on 10/28/2021)  ? ?No facility-administered encounter medications on file as of 10/31/2021.  ? ? ?Follow-up: No follow-ups on file.  ? ?11/02/2021, MD ? ?

## 2021-11-01 ENCOUNTER — Other Ambulatory Visit: Payer: Self-pay | Admitting: Family Medicine

## 2021-11-01 DIAGNOSIS — N3281 Overactive bladder: Secondary | ICD-10-CM

## 2021-11-01 NOTE — Telephone Encounter (Signed)
Requested Prescriptions  ?Pending Prescriptions Disp Refills  ?? oxybutynin (DITROPAN XL) 15 MG 24 hr tablet [Pharmacy Med Name: OXYBUTYNIN ER 15MG  TABLETS] 90 tablet 0  ?  Sig: TAKE 1 TABLET(15 MG) BY MOUTH AT BEDTIME  ?  ? Urology:  Bladder Agents Passed - 11/01/2021  3:23 AM  ?  ?  Passed - Valid encounter within last 12 months  ?  Recent Outpatient Visits   ?      ? Yesterday Pre-op evaluation  ? Primary Care at [facility], MD  ? 2 months ago Acute bronchitis, unspecified organism  ? Primary Care at [facility], MD  ? 6 months ago Uncontrolled hypertension  ? Primary Care at [facility], MD  ? 7 months ago Uncontrolled hypertension  ? Primary Care at [facility], MD  ? 7 months ago Diabetes mellitus without complication [facility])  ? Primary Care at [facility], FNP  ?  ?  ? ?  ?  ?  ? ?

## 2021-11-01 NOTE — Progress Notes (Signed)
Anesthesia Chart Review: ? ? Case: [id number] Date/Time: 11/11/21 0937  ? Procedure: LEFT TOTAL HIP ARTHROPLASTY ANTERIOR APPROACH (Left: Hip)  ? Anesthesia type: Spinal  ? Pre-op diagnosis: LEFT HIP OSTEOARTHRITIS AVASCULAE NECROSIS  ? Location[facility]  ? Surgeons: Gean Birchwood, MD  ? ?  ? ? ?DISCUSSION: ?Pt is 61 years old with hx HTN, COPD, nocturnal hypoxemia (uses 4L O2 at night only) ? ?Hospitalized 5/30-01/03/21 for acute hypoxic respiratory failure. Presented with SOB since R THA 12/24/20; CXR concerning for pneumonia ? ?Currently undergoing treatment for sinus infection by Scot Jun, PA at [facility]. Almost finished with doxycycline course.  ? ? ?VS: BP (!) 152/87   Pulse 88   Temp 37.3 ?C (Oral)   Resp 16   Ht 5\' 2"  (1.575 m)   Wt 96.2 kg   SpO2 97%   BMI 38.78 kg/m?  ? ?PROVIDERS: ?- PCP is , MD who cleared pt for surgery at last office visit 10/31/21 ? ? ?LABS: Labs reviewed: Acceptable for surgery. ?(all labs ordered are listed, but only abnormal results are displayed) ? ?Labs Reviewed  ?BASIC METABOLIC PANEL - Abnormal; Notable for the following components:  ?    Result Value  ? Glucose, Bld 146 (*)   ? All other components within normal limits  ?CBC - Abnormal; Notable for the following components:  ? WBC 12.3 (*)   ? All other components within normal limits  ?SURGICAL PCR SCREEN  ?TYPE AND SCREEN  ? ? ?PROCEDURES:  ?Sleep study 04/11/21:  ?- No significant obstructive sleep apnea occurred during this study (AHI = 3.7/h). ?- Oxygen desaturation was noted during this study (Min O2 = 82.0%). Due to sustained O2 saturation ?- The patient snored with moderate snoring volume. ?- No cardiac abnormalities were noted during this study. Sinus tachycardia noted prior to addition of O2. ?- Clinically significant periodic limb movements did not occur during sleep. No significant associated arousals. ?  ?DIAGNOSIS ?- Nocturnal Hypoxemia (G47.36) ?  ?RECOMMENDATIONS ?-  Supplemental O2 2L during sleep. ?- Sleep hygiene should be reviewed to assess factors that may improve sleep quality. ?- Weight management and regular exercise should be initiated or continued if appropriate. ? ? ? ?IMAGES: ?CXR 10/31/21: No radiographic evidence of acute cardiopulmonary disease. ? ? ?EKG 12/31/20: sinus rhythm. Nonspecific repol abnormality, diffuse leads ? ? ?CV: ?Echo 01/01/21:  ?1. Left ventricular ejection fraction, by estimation, is 65 to 70%. The left ventricle has normal function. The left ventricle has no regional wall motion abnormalities. There is mild left ventricular hypertrophy. Left ventricular diastolic parameters are indeterminate.  ?2. Right ventricular systolic function is normal. The right ventricular size is normal. Tricuspid regurgitation signal is inadequate for assessing PA pressure.  ?3. Left atrial size was mildly dilated.  ?4. The mitral valve is normal in structure. No evidence of mitral valve regurgitation. No evidence of mitral stenosis. Increased gradient (01/03/21) through mitral valve likely due to high output state, normal MVA by continuity equation (2.5 cm^2)  ?5. The aortic valve was not well visualized. Aortic valve regurgitation is not visualized. No aortic stenosis is present.  ? ? ?Past Medical History:  ?Diagnosis Date  ? Anxiety   ? Arthritis   ? COPD (chronic obstructive pulmonary disease) (HCC)   ? uses oxygen 4L at nite  ? Depression   ? Dyspnea   ? GERD (gastroesophageal reflux disease)   ? Hypertension   ? Peripheral edema   ? 2+  at PAT visit  ? ? ?Past Surgical History:  ?Procedure Laterality Date  ? ABDOMINAL HYSTERECTOMY  1986  ? CARPAL TUNNEL RELEASE Bilateral 1980  ? Per patient  ? SHOULDER SURGERY Left 1980  ? 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  ? Procedure: RIGHT TOTAL HIP ARTHROPLASTY ANTERIOR APPROACH;  Surgeon: Gean Birchwood, MD;  Location: [facility];  Service: Orthopedics;  Laterality: Right;  ? ? ?MEDICATIONS: ? amLODipine (NORVASC) 10 MG tablet   ? aspirin EC 81 MG tablet  ? atorvastatin (LIPITOR) 40 MG tablet  ? benzonatate (TESSALON) 100 MG capsule  ? budesonide-formoterol (SYMBICORT) 160-4.5 MCG/ACT inhaler  ? busPIRone (BUSPAR) 10 MG tablet  ? doxycycline (VIBRAMYCIN) 100 MG capsule  ? doxycycline (VIBRAMYCIN) 100 MG capsule  ? fluticasone furoate-vilanterol (BREO ELLIPTA) 100-25 MCG/INH AEPB  ? folic acid (FOLVITE) 1 MG tablet  ? gabapentin (NEURONTIN) 300 MG capsule  ? hydrOXYzine (ATARAX) 50 MG tablet  ? ibuprofen (ADVIL) 200 MG tablet  ? losartan (COZAAR) 100 MG tablet  ? losartan (COZAAR) 50 MG tablet  ? Menthol (ICY HOT) 5 % PTCH  ? Multiple Vitamin (MULTIVITAMIN WITH MINERALS) TABS tablet  ? oxybutynin (DITROPAN XL) 15 MG 24 hr tablet  ? OXYGEN  ? pantoprazole (PROTONIX) 40 MG tablet  ? POTASSIUM PO  ? QUEtiapine (SEROQUEL) 300 MG tablet  ? sertraline (ZOLOFT) 100 MG tablet  ? traMADol (ULTRAM) 50 MG tablet  ? VITAMIN D PO  ? ?No current facility-administered medications for this encounter.  ? ? ?If no changes, I anticipate pt can proceed with surgery as scheduled.  ? ?Rica Mast, PhD, FNP-BC ?[facility]/Anesthesiology ?Phone: [phone] ?11/04/2021 8:24 AM ? ? ? ? ? ? ?

## 2021-11-06 ENCOUNTER — Other Ambulatory Visit: Payer: Self-pay | Admitting: Family Medicine

## 2021-11-06 DIAGNOSIS — N3281 Overactive bladder: Secondary | ICD-10-CM

## 2021-11-06 DIAGNOSIS — M1612 Unilateral primary osteoarthritis, left hip: Secondary | ICD-10-CM | POA: Diagnosis present

## 2021-11-06 NOTE — H&P (Signed)
TOTAL HIP ADMISSION H&P ? ?Patient is admitted for left total hip arthroplasty. ? ?Subjective: ? ?Chief Complaint: left hip pain ? ?HPI: [name], 61 y.o. female, has a history of pain and functional disability in the left hip(s) due to  AVN  and patient has failed non-surgical conservative treatments for greater than 12 weeks to include NSAID's and/or analgesics, flexibility and strengthening excercises, use of assistive devices, weight reduction as appropriate, and activity modification.  Onset of symptoms was abrupt starting  several  years ago with rapidlly worsening course since that time.The patient noted no past surgery on the left hip(s).  Patient currently rates pain in the left hip at 10 out of 10 with activity. Patient has night pain, worsening of pain with activity and weight bearing, trendelenberg gait, pain that interfers with activities of daily living, and pain with passive range of motion. Patient has evidence of  AVN  by imaging studies. This condition presents safety issues increasing the risk of falls. This patient has had AVN.  There is no current active infection. ? ?Patient Active Problem List  ? Diagnosis Date Noted  ? Osteoarthritis of left hip 11/06/2021  ? Hypoxia   ? Hypokalemia   ? Wheezing   ? Pneumonia 12/31/2020  ? History of total hip arthroplasty, right 12/24/2020  ? Type 2 diabetes mellitus ([place]) 12/18/2020  ? Avascular necrosis of bone of right hip ([place]) 09/17/2020  ? Greater trochanteric pain syndrome of left lower extremity 07/24/2020  ? Rheumatoid factor positive 06/26/2020  ? CRP elevated 06/26/2020  ? Bilateral hand pain 06/26/2020  ? Stasis dermatitis of both legs 06/26/2020  ? Alcohol use disorder, severe, in sustained remission ([place]) 04/30/2020  ? Chronic sinusitis 04/10/2020  ? Liver cirrhosis ([place]) 03/08/2020  ? Major depressive disorder, recurrent episode, moderate with anxious distress ([place]) 02/11/2020  ? Alcohol abuse, in remission 12/16/2019  ? Nicotine  dependence, cigarettes, uncomplicated [id number]  ? Generalized anxiety disorder 12/16/2019  ? ?Past Medical History:  ?Diagnosis Date  ? Anxiety   ? Arthritis   ? COPD (chronic obstructive pulmonary disease) ([place])   ? uses oxygen 4L at nite  ? Depression   ? Dyspnea   ? GERD (gastroesophageal reflux disease)   ? Hypertension   ? Peripheral edema   ? 2+ at PAT visit  ?  ?Past Surgical History:  ?Procedure Laterality Date  ? ABDOMINAL HYSTERECTOMY  1986  ? CARPAL TUNNEL RELEASE Bilateral 1980  ? Per patient  ? SHOULDER SURGERY Left 1980  ? 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  ? Procedure: RIGHT TOTAL HIP ARTHROPLASTY ANTERIOR APPROACH;  Surgeon: Frederik Pear, MD;  Location: [facility];  Service: Orthopedics;  Laterality: Right;  ?  ?No current facility-administered medications for this encounter.  ? ?Current Outpatient Medications  ?Medication Sig Dispense Refill Last Dose  ? amLODipine (NORVASC) 10 MG tablet TAKE 1 TABLET(10 MG) BY MOUTH DAILY 90 tablet 0   ? aspirin EC 81 MG tablet Take 1 tablet (81 mg total) by mouth 2 (two) times daily. 60 tablet 0   ? atorvastatin (LIPITOR) 40 MG tablet Take 1 tablet (40 mg total) by mouth daily. 90 tablet 3   ? benzonatate (TESSALON) 100 MG capsule Take 1 capsule (100 mg total) by mouth 2 (two) times daily as needed for cough. 20 capsule 0   ? busPIRone (BUSPAR) 10 MG tablet Take 1 tablet (10 mg total) by mouth 3 (three) times daily. 90 tablet 3   ? doxycycline (VIBRAMYCIN) 100 MG  capsule Take 100 mg by mouth 2 (two) times daily. 7 days     ? folic acid (FOLVITE) 1 MG tablet TAKE 1 TABLET(1 MG) BY MOUTH DAILY 90 tablet 0   ? gabapentin (NEURONTIN) 300 MG capsule TAKE 1 CAPSULE(300 MG) BY MOUTH THREE TIMES DAILY 90 capsule 1   ? hydrOXYzine (ATARAX) 50 MG tablet TAKE 1 TABLET (50 MG TOTAL) BY MOUTH 3 (THREE) TIMES DAILY AS NEEDED FOR ANXIETY. 90 tablet 3   ? ibuprofen (ADVIL) 200 MG tablet Take 600 mg by mouth every 6 (six) hours as needed for moderate pain.     ? losartan  (COZAAR) 50 MG tablet Take 1 tablet (50 mg total) by mouth daily. 90 tablet 0   ? Menthol (ICY HOT) 5 % PTCH Apply 1 patch topically daily as needed (hip pain).     ? Multiple Vitamin (MULTIVITAMIN WITH MINERALS) TABS tablet Take 1 tablet by mouth daily.     ? OXYGEN Inhale 4 L into the lungs at bedtime.     ? pantoprazole (PROTONIX) 40 MG tablet Take 1 tablet (40 mg total) by mouth 2 (two) times daily. 180 tablet 0   ? POTASSIUM PO Take 1 tablet by mouth daily.     ? QUEtiapine (SEROQUEL) 300 MG tablet TAKE 1 TABLET(300 MG) BY MOUTH AT BEDTIME 30 tablet 3   ? sertraline (ZOLOFT) 100 MG tablet Take 2 tablets (200 mg total) by mouth daily. 60 tablet 3   ? traMADol (ULTRAM) 50 MG tablet Take 50 mg by mouth every 6 (six) hours as needed for severe pain.     ? VITAMIN D PO Take 1 capsule by mouth daily.     ? budesonide-formoterol (SYMBICORT) 160-4.5 MCG/ACT inhaler INHALE 2 PUFFS INTO THE LUNGS TWICE DAILY 10.2 g 0   ? doxycycline (VIBRAMYCIN) 100 MG capsule TAKE 1 CAPSULE(100 MG) BY MOUTH TWICE DAILY FOR 7 DAYS (Patient not taking: Reported on 10/31/2021)     ? fluticasone furoate-vilanterol (BREO ELLIPTA) 100-25 MCG/INH AEPB Inhale 1 puff into the lungs daily. (Patient not taking: Reported on 10/28/2021) 60 each 11 Not Taking  ? losartan (COZAAR) 100 MG tablet Take 1 tablet (100 mg total) by mouth daily. 90 tablet 0   ? oxybutynin (DITROPAN XL) 15 MG 24 hr tablet TAKE 1 TABLET(15 MG) BY MOUTH AT BEDTIME 90 tablet 0   ? ?Allergies  ?Allergen Reactions  ? Amoxicillin   ?  Yeast infection   ?  ?Social History  ? ?Tobacco Use  ? Smoking status: Every Day  ?  Packs/day: 1.00  ?  Years: 35.00  ?  Pack years: 35.00  ?  Types: Cigarettes  ?  Start date: 08/04/1978  ? Smokeless tobacco: Never  ?Substance Use Topics  ? Alcohol use: Never  ?  ?Family History  ?Problem Relation Age of Onset  ? Ulcers Father   ? Hypotension Sister   ? Hypertension Brother   ?  ? ?Review of Systems  ?Constitutional: Negative.   ?HENT:    ?     Sinus  problems  ?Eyes: Negative.   ?Respiratory: Negative.    ?Cardiovascular:  Positive for leg swelling.  ?     HTN  ?Endocrine: Negative.   ?Genitourinary: Negative.   ?Musculoskeletal:  Positive for arthralgias.  ?Allergic/Immunologic: Negative.   ?Neurological: Negative.   ?Hematological: Negative.   ?Psychiatric/Behavioral: Negative.    ? ?Objective: ? ?Physical Exam ?Constitutional:   ?   Appearance: Normal appearance. She is normal  weight.  ?HENT:  ?   Head: Normocephalic and atraumatic.  ?   Nose: Nose normal.  ?   Mouth/Throat:  ?   Mouth: Mucous membranes are moist.  ?   Pharynx: Oropharynx is clear.  ?Cardiovascular:  ?   Pulses: Normal pulses.  ?Pulmonary:  ?   Effort: Pulmonary effort is normal.  ?Musculoskeletal:  ?   Cervical back: Normal range of motion and neck supple.  ?   Comments: The left hip is highly irritable to internal or external rotation foot tap is positive good power to testing of abductors abductors flexors and extensors but flexion causes her severe pain especially when it is resisted.  ?Skin: ?   General: Skin is warm and dry.  ?Neurological:  ?   General: No focal deficit present.  ?   Mental Status: She is alert and oriented to person, place, and time. Mental status is at baseline.  ?Psychiatric:     ?   Mood and Affect: Mood normal.     ?   Behavior: Behavior normal.     ?   Thought Content: Thought content normal.     ?   Judgment: Judgment normal.  ? ? ?Vital signs in last 24 hours: ?  ? ?Labs: ? ? ?Estimated body mass index is 38.78 kg/m? as calculated from the following: ?  Height as of 10/31/21: 5\' 2"  (1.575 m). ?  Weight as of 10/31/21: 96.2 kg. ? ? ?Imaging Review ?Plain radiographs demonstrate  AP of the pelvis and crosstable lateral of the left hip are taken and reviewed in office today.  This shows a well-placed well fixed right total hip arthroplasty with a single dial miles cable.  I do question whether the patient's left hip has medial pole arthritis. ? ?An MRI scan has been  accomplished showing avascular necrosis of the left femoral head with some early fragmentation superiorly involving pretty much 100% of the weightbearing dome ? ?Assessment/Plan: ? ?End stage arthritis, left hip(s)

## 2021-11-09 NOTE — Anesthesia Preprocedure Evaluation (Addendum)
Anesthesia Evaluation  ?Patient identified by MRN, date of birth, ID band ?Patient awake ? ? ? ?Reviewed: ?Allergy & Precautions, NPO status , Patient's Chart, lab work & pertinent test results ? ?History of Anesthesia Complications ?Negative for: history of anesthetic complications ? ?Airway ?Mallampati: I ? ?TM Distance: >3 FB ?Neck ROM: Full ? ? ? Dental ? ?(+) Dental Advisory Given, Upper Dentures, Edentulous Upper,  ?  ?Pulmonary ?COPD,  COPD inhaler, Current Smoker and Patient abstained from smoking.,  ?  ?Pulmonary exam normal ? ? ? ? ? ? ? Cardiovascular ?hypertension, Normal cardiovascular exam ? ? ?'21 TTE - EF 60 to 65%. Grade I diastolic dysfunction (impaired relaxation). Mildly elevated pulmonary artery systolic pressure. The estimated right ventricular systolic pressure is [id number] mmHg. Left atrial size was mildly dilated. Trivial MR, TR, and PR. ? ?  ?Neuro/Psych ?PSYCHIATRIC DISORDERS Anxiety Depression negative neurological ROS ?   ? GI/Hepatic ?GERD  Medicated,(+) Cirrhosis  ?  ? substance abuse ? alcohol use,   ?Endo/Other  ?negative endocrine ROS ?Obesity ? ? Renal/GU ?negative Renal ROS  ? ?  ?Musculoskeletal ? ?(+) Arthritis ,  ? Abdominal ?  ?Peds ? Hematology ?negative hematology ROS ?(+)   ?Anesthesia Other Findings ?Room air sat 96% ? ? Reproductive/Obstetrics ? ?  ? ? ? ? ? ? ? ? ? ? ? ? ? ?  ?  ? ? ? ? ? ? ? ?Anesthesia Physical ? ?Anesthesia Plan ? ?ASA: 3 ? ?Anesthesia Plan: Spinal  ? ?Post-op Pain Management: Minimal or no pain anticipated  ? ?Induction: Intravenous ? ?PONV Risk Score and Plan: 2 and Treatment may vary due to age or medical condition and Propofol infusion ? ?Airway Management Planned: Natural Airway and Simple Face Mask ? ?Additional Equipment: None ? ?Intra-op Plan:  ? ?Post-operative Plan:  ? ?Informed Consent: I have reviewed the patients History and Physical, chart, labs and discussed the procedure including the risks, benefits and  alternatives for the proposed anesthesia with the patient or authorized representative who has indicated his/her understanding and acceptance.  ? ? ? ? ? ?Plan Discussed with: CRNA and Anesthesiologist ? ?Anesthesia Plan Comments: ( )  ? ? ? ? ? ? ?Anesthesia Quick Evaluation ? ?

## 2021-11-10 MED ORDER — TRANEXAMIC ACID 1000 MG/10ML IV SOLN
2000.0000 mg | INTRAVENOUS | Status: DC
  Filled 2021-11-10: qty 20

## 2021-11-11 ENCOUNTER — Encounter (HOSPITAL_COMMUNITY)
Admission: AD | Disposition: A | Discharge status: Home Health | Payer: Self-pay | Source: Ambulatory Visit | Attending: Orthopedic Surgery

## 2021-11-11 ENCOUNTER — Encounter (HOSPITAL_COMMUNITY): Payer: Self-pay | Admitting: Orthopedic Surgery

## 2021-11-11 ENCOUNTER — Ambulatory Visit (HOSPITAL_COMMUNITY): Payer: Medicaid Other

## 2021-11-11 ENCOUNTER — Inpatient Hospital Stay (HOSPITAL_COMMUNITY)
Admission: AD | Admit: 2021-11-11 | Discharge: 2021-11-14 | DRG: 470 | Disposition: A | Discharge status: Home Health | Payer: Medicaid Other | Source: Ambulatory Visit | Attending: Orthopedic Surgery | Admitting: Orthopedic Surgery

## 2021-11-11 ENCOUNTER — Other Ambulatory Visit: Payer: Self-pay

## 2021-11-11 ENCOUNTER — Ambulatory Visit (HOSPITAL_COMMUNITY): Payer: Medicaid Other | Admitting: Anesthesiology

## 2021-11-11 ENCOUNTER — Ambulatory Visit (HOSPITAL_COMMUNITY): Payer: Medicaid Other | Admitting: Emergency Medicine

## 2021-11-11 DIAGNOSIS — K7460 Unspecified cirrhosis of liver: Secondary | ICD-10-CM | POA: Diagnosis present

## 2021-11-11 DIAGNOSIS — F418 Other specified anxiety disorders: Secondary | ICD-10-CM | POA: Diagnosis not present

## 2021-11-11 DIAGNOSIS — Z7951 Long term (current) use of inhaled steroids: Secondary | ICD-10-CM

## 2021-11-11 DIAGNOSIS — J449 Chronic obstructive pulmonary disease, unspecified: Secondary | ICD-10-CM | POA: Diagnosis present

## 2021-11-11 DIAGNOSIS — Z96642 Presence of left artificial hip joint: Secondary | ICD-10-CM

## 2021-11-11 DIAGNOSIS — M1612 Unilateral primary osteoarthritis, left hip: Secondary | ICD-10-CM

## 2021-11-11 DIAGNOSIS — M879 Osteonecrosis, unspecified: Secondary | ICD-10-CM | POA: Diagnosis not present

## 2021-11-11 DIAGNOSIS — Z90710 Acquired absence of both cervix and uterus: Secondary | ICD-10-CM

## 2021-11-11 DIAGNOSIS — I10 Essential (primary) hypertension: Secondary | ICD-10-CM

## 2021-11-11 DIAGNOSIS — Z96641 Presence of right artificial hip joint: Secondary | ICD-10-CM | POA: Diagnosis present

## 2021-11-11 DIAGNOSIS — Z01818 Encounter for other preprocedural examination: Principal | ICD-10-CM

## 2021-11-11 DIAGNOSIS — Z881 Allergy status to other antibiotic agents status: Secondary | ICD-10-CM

## 2021-11-11 DIAGNOSIS — Z8249 Family history of ischemic heart disease and other diseases of the circulatory system: Secondary | ICD-10-CM

## 2021-11-11 DIAGNOSIS — F32A Depression, unspecified: Secondary | ICD-10-CM | POA: Diagnosis present

## 2021-11-11 DIAGNOSIS — E119 Type 2 diabetes mellitus without complications: Secondary | ICD-10-CM | POA: Diagnosis present

## 2021-11-11 DIAGNOSIS — Z9981 Dependence on supplemental oxygen: Secondary | ICD-10-CM

## 2021-11-11 DIAGNOSIS — D62 Acute posthemorrhagic anemia: Secondary | ICD-10-CM | POA: Diagnosis not present

## 2021-11-11 DIAGNOSIS — K219 Gastro-esophageal reflux disease without esophagitis: Secondary | ICD-10-CM | POA: Diagnosis present

## 2021-11-11 DIAGNOSIS — Z7982 Long term (current) use of aspirin: Secondary | ICD-10-CM

## 2021-11-11 DIAGNOSIS — F17210 Nicotine dependence, cigarettes, uncomplicated: Secondary | ICD-10-CM

## 2021-11-11 DIAGNOSIS — F411 Generalized anxiety disorder: Secondary | ICD-10-CM | POA: Diagnosis present

## 2021-11-11 DIAGNOSIS — Z79899 Other long term (current) drug therapy: Secondary | ICD-10-CM

## 2021-11-11 HISTORY — PX: TOTAL HIP ARTHROPLASTY: SHX124

## 2021-11-11 SURGERY — ARTHROPLASTY, HIP, TOTAL, ANTERIOR APPROACH
Anesthesia: Spinal | Site: Hip | Laterality: Left

## 2021-11-11 MED ORDER — PROPOFOL 500 MG/50ML IV EMUL
INTRAVENOUS | Status: AC
  Filled 2021-11-11: qty 50

## 2021-11-11 MED ORDER — LACTATED RINGERS IV SOLN: INTRAVENOUS | Status: DC

## 2021-11-11 MED ORDER — TRANEXAMIC ACID 1000 MG/10ML IV SOLN: 2000.0000 mg | Freq: Once | INTRAVENOUS | Status: DC

## 2021-11-11 MED ORDER — MIDAZOLAM HCL 2 MG/2ML IJ SOLN
INTRAMUSCULAR | Status: AC
  Filled 2021-11-11: qty 2

## 2021-11-11 MED ORDER — AMLODIPINE BESYLATE 10 MG PO TABS
10.0000 mg | ORAL_TABLET | Freq: Every day | ORAL | Status: DC
Start: 2021-11-12 — End: 2021-11-14
  Administered 2021-11-12 – 2021-11-13 (×2): 10 mg via ORAL
  Filled 2021-11-11 (×2): qty 1

## 2021-11-11 MED ORDER — FENTANYL CITRATE (PF) 100 MCG/2ML IJ SOLN
INTRAMUSCULAR | Status: AC
  Filled 2021-11-11: qty 2

## 2021-11-11 MED ORDER — SODIUM CHLORIDE (PF) 0.9 % IJ SOLN
INTRAMUSCULAR | Status: AC
  Filled 2021-11-11: qty 50

## 2021-11-11 MED ORDER — BUPIVACAINE IN DEXTROSE 0.75-8.25 % IT SOLN
INTRATHECAL | Status: DC | PRN
  Administered 2021-11-11: 1.6 mL via INTRATHECAL

## 2021-11-11 MED ORDER — FLEET ENEMA 7-19 GM/118ML RE ENEM: 1.0000 | ENEMA | Freq: Once | RECTAL | Status: DC | PRN

## 2021-11-11 MED ORDER — HYDROMORPHONE HCL 1 MG/ML IJ SOLN
0.5000 mg | INTRAMUSCULAR | Status: DC | PRN
  Administered 2021-11-11: 1 mg via INTRAVENOUS
  Filled 2021-11-11: qty 1

## 2021-11-11 MED ORDER — CHLORHEXIDINE GLUCONATE 0.12 % MT SOLN: 15.0000 mL | Freq: Once | OROMUCOSAL | Status: AC

## 2021-11-11 MED ORDER — POTASSIUM 99 MG PO TABS: 1.0000 | ORAL_TABLET | Freq: Every day | ORAL | Status: DC

## 2021-11-11 MED ORDER — PANTOPRAZOLE SODIUM 40 MG PO TBEC
40.0000 mg | DELAYED_RELEASE_TABLET | Freq: Two times a day (BID) | ORAL | Status: DC
  Administered 2021-11-11 – 2021-11-14 (×6): 40 mg via ORAL
  Filled 2021-11-11 (×6): qty 1

## 2021-11-11 MED ORDER — OXYBUTYNIN CHLORIDE ER 5 MG PO TB24
15.0000 mg | ORAL_TABLET | Freq: Every day | ORAL | Status: DC
  Administered 2021-11-11 – 2021-11-13 (×3): 15 mg via ORAL
  Filled 2021-11-11 (×3): qty 3

## 2021-11-11 MED ORDER — OXYCODONE HCL 5 MG PO TABS: 5.0000 mg | ORAL_TABLET | Freq: Once | ORAL | Status: DC | PRN

## 2021-11-11 MED ORDER — CHOLECALCIFEROL 10 MCG (400 UNIT) PO TABS
400.0000 [IU] | ORAL_TABLET | Freq: Every day | ORAL | Status: DC
  Administered 2021-11-11 – 2021-11-14 (×4): 400 [IU] via ORAL
  Filled 2021-11-11 (×4): qty 1

## 2021-11-11 MED ORDER — ORAL CARE MOUTH RINSE
15.0000 mL | Freq: Once | OROMUCOSAL | Status: AC
  Administered 2021-11-11: 15 mL via OROMUCOSAL

## 2021-11-11 MED ORDER — FOLIC ACID 1 MG PO TABS
1.0000 mg | ORAL_TABLET | Freq: Every day | ORAL | Status: DC
  Administered 2021-11-11 – 2021-11-14 (×4): 1 mg via ORAL
  Filled 2021-11-11 (×4): qty 1

## 2021-11-11 MED ORDER — OXYCODONE HCL 5 MG PO TABS: 5.0000 mg | ORAL_TABLET | ORAL | Status: DC | PRN

## 2021-11-11 MED ORDER — HYDROXYZINE HCL 25 MG PO TABS
50.0000 mg | ORAL_TABLET | Freq: Three times a day (TID) | ORAL | Status: DC | PRN
  Administered 2021-11-11: 50 mg via ORAL
  Filled 2021-11-11: qty 2

## 2021-11-11 MED ORDER — FLUTICASONE FUROATE-VILANTEROL 100-25 MCG/INH IN AEPB: 1.0000 | INHALATION_SPRAY | Freq: Every day | RESPIRATORY_TRACT | Status: DC

## 2021-11-11 MED ORDER — FENTANYL CITRATE (PF) 100 MCG/2ML IJ SOLN
INTRAMUSCULAR | Status: DC | PRN
Start: 2021-11-11 — End: 2021-11-11
  Administered 2021-11-11: 100 ug via INTRAVENOUS

## 2021-11-11 MED ORDER — POLYETHYLENE GLYCOL 3350 17 G PO PACK: 17.0000 g | PACK | Freq: Every day | ORAL | Status: DC | PRN

## 2021-11-11 MED ORDER — HYDROMORPHONE HCL 2 MG PO TABS
1.0000 mg | ORAL_TABLET | ORAL | Status: DC | PRN
  Administered 2021-11-11 – 2021-11-12 (×4): 2 mg via ORAL
  Filled 2021-11-11 (×4): qty 1

## 2021-11-11 MED ORDER — METHOCARBAMOL 500 MG IVPB - SIMPLE MED
500.0000 mg | Freq: Four times a day (QID) | INTRAVENOUS | Status: DC | PRN
Start: 2021-11-11 — End: 2021-11-14
  Administered 2021-11-11: 500 mg via INTRAVENOUS
  Filled 2021-11-11: qty 50

## 2021-11-11 MED ORDER — ONDANSETRON HCL 4 MG/2ML IJ SOLN
INTRAMUSCULAR | Status: AC
  Filled 2021-11-11: qty 2

## 2021-11-11 MED ORDER — METOCLOPRAMIDE HCL 5 MG/ML IJ SOLN: 5.0000 mg | Freq: Three times a day (TID) | INTRAMUSCULAR | Status: DC | PRN

## 2021-11-11 MED ORDER — ACETAMINOPHEN 160 MG/5ML PO SOLN: 325.0000 mg | ORAL | Status: DC | PRN

## 2021-11-11 MED ORDER — TRAMADOL HCL 50 MG PO TABS
50.0000 mg | ORAL_TABLET | Freq: Four times a day (QID) | ORAL | Status: DC | PRN
Start: 2021-11-11 — End: 2021-11-13
  Administered 2021-11-11 – 2021-11-13 (×3): 50 mg via ORAL
  Filled 2021-11-11 (×4): qty 1

## 2021-11-11 MED ORDER — BUPIVACAINE LIPOSOME 1.3 % IJ SUSP
INTRAMUSCULAR | Status: AC
  Filled 2021-11-11: qty 10

## 2021-11-11 MED ORDER — BUPIVACAINE LIPOSOME 1.3 % IJ SUSP
10.0000 mL | Freq: Once | INTRAMUSCULAR | Status: DC
Start: 2021-11-11 — End: 2021-11-11

## 2021-11-11 MED ORDER — BUSPIRONE HCL 10 MG PO TABS
10.0000 mg | ORAL_TABLET | Freq: Three times a day (TID) | ORAL | Status: DC
  Administered 2021-11-11 – 2021-11-14 (×8): 10 mg via ORAL
  Filled 2021-11-11 (×8): qty 1

## 2021-11-11 MED ORDER — GABAPENTIN 300 MG PO CAPS
300.0000 mg | ORAL_CAPSULE | Freq: Three times a day (TID) | ORAL | Status: DC
  Administered 2021-11-11 – 2021-11-14 (×8): 300 mg via ORAL
  Filled 2021-11-11 (×8): qty 1

## 2021-11-11 MED ORDER — PROPOFOL 500 MG/50ML IV EMUL
INTRAVENOUS | Status: DC | PRN
  Administered 2021-11-11: 75 ug/kg/min via INTRAVENOUS
  Administered 2021-11-11: 100 ug/kg/min via INTRAVENOUS

## 2021-11-11 MED ORDER — BISACODYL 5 MG PO TBEC: 5.0000 mg | DELAYED_RELEASE_TABLET | Freq: Every day | ORAL | Status: DC | PRN

## 2021-11-11 MED ORDER — METOCLOPRAMIDE HCL 5 MG PO TABS: 5.0000 mg | ORAL_TABLET | Freq: Three times a day (TID) | ORAL | Status: DC | PRN

## 2021-11-11 MED ORDER — ONDANSETRON HCL 4 MG/2ML IJ SOLN: 4.0000 mg | Freq: Once | INTRAMUSCULAR | Status: DC | PRN

## 2021-11-11 MED ORDER — POVIDONE-IODINE 10 % EX SWAB
2.0000 "application " | Freq: Once | CUTANEOUS | Status: AC
  Administered 2021-11-11: 2 via TOPICAL

## 2021-11-11 MED ORDER — OXYCODONE HCL 5 MG/5ML PO SOLN: 5.0000 mg | Freq: Once | ORAL | Status: DC | PRN

## 2021-11-11 MED ORDER — BUPIVACAINE-EPINEPHRINE 0.25% -1:200000 IJ SOLN
INTRAMUSCULAR | Status: DC | PRN
  Administered 2021-11-11: 30 mL

## 2021-11-11 MED ORDER — METHOCARBAMOL 500 MG PO TABS
500.0000 mg | ORAL_TABLET | Freq: Four times a day (QID) | ORAL | Status: DC | PRN
  Administered 2021-11-11 – 2021-11-12 (×2): 500 mg via ORAL
  Filled 2021-11-11 (×2): qty 1

## 2021-11-11 MED ORDER — MEPERIDINE HCL 50 MG/ML IJ SOLN: 6.2500 mg | INTRAMUSCULAR | Status: DC | PRN

## 2021-11-11 MED ORDER — LOSARTAN POTASSIUM 50 MG PO TABS
100.0000 mg | ORAL_TABLET | Freq: Every day | ORAL | Status: DC
  Administered 2021-11-13: 100 mg via ORAL
  Filled 2021-11-11: qty 2

## 2021-11-11 MED ORDER — FENTANYL CITRATE PF 50 MCG/ML IJ SOSY
PREFILLED_SYRINGE | INTRAMUSCULAR | Status: AC
  Filled 2021-11-11: qty 2

## 2021-11-11 MED ORDER — BENZONATATE 100 MG PO CAPS: 100.0000 mg | ORAL_CAPSULE | Freq: Two times a day (BID) | ORAL | Status: DC | PRN

## 2021-11-11 MED ORDER — VANCOMYCIN HCL IN DEXTROSE 1-5 GM/200ML-% IV SOLN
1000.0000 mg | INTRAVENOUS | Status: AC
  Filled 2021-11-11: qty 200

## 2021-11-11 MED ORDER — QUETIAPINE FUMARATE 50 MG PO TABS
300.0000 mg | ORAL_TABLET | Freq: Every day | ORAL | Status: DC
  Administered 2021-11-11 – 2021-11-13 (×3): 300 mg via ORAL
  Filled 2021-11-11 (×3): qty 6

## 2021-11-11 MED ORDER — ATORVASTATIN CALCIUM 40 MG PO TABS
40.0000 mg | ORAL_TABLET | Freq: Every day | ORAL | Status: DC
Start: 2021-11-11 — End: 2021-11-14
  Administered 2021-11-11 – 2021-11-12 (×2): 40 mg via ORAL
  Filled 2021-11-11 (×2): qty 1

## 2021-11-11 MED ORDER — BUPIVACAINE-EPINEPHRINE (PF) 0.25% -1:200000 IJ SOLN
INTRAMUSCULAR | Status: AC
  Filled 2021-11-11: qty 30

## 2021-11-11 MED ORDER — ONDANSETRON HCL 4 MG/2ML IJ SOLN
4.0000 mg | Freq: Four times a day (QID) | INTRAMUSCULAR | Status: DC | PRN
Start: 2021-11-11 — End: 2021-11-14

## 2021-11-11 MED ORDER — FENTANYL CITRATE PF 50 MCG/ML IJ SOSY
25.0000 ug | PREFILLED_SYRINGE | INTRAMUSCULAR | Status: DC | PRN
  Administered 2021-11-11: 50 ug via INTRAVENOUS

## 2021-11-11 MED ORDER — ACETAMINOPHEN 325 MG PO TABS: 325.0000 mg | ORAL_TABLET | ORAL | Status: DC | PRN

## 2021-11-11 MED ORDER — TRANEXAMIC ACID-NACL 1000-0.7 MG/100ML-% IV SOLN
1000.0000 mg | INTRAVENOUS | Status: AC
  Administered 2021-11-11: 1000 mg via INTRAVENOUS
  Filled 2021-11-11: qty 100

## 2021-11-11 MED ORDER — TRANEXAMIC ACID 1000 MG/10ML IV SOLN
INTRAVENOUS | Status: DC | PRN
  Administered 2021-11-11: 2000 mg via TOPICAL

## 2021-11-11 MED ORDER — BUPIVACAINE LIPOSOME 1.3 % IJ SUSP
INTRAMUSCULAR | Status: AC
  Filled 2021-11-11: qty 20

## 2021-11-11 MED ORDER — PHENYLEPHRINE 40 MCG/ML (10ML) SYRINGE FOR IV PUSH (FOR BLOOD PRESSURE SUPPORT)
PREFILLED_SYRINGE | INTRAVENOUS | Status: AC
  Filled 2021-11-11: qty 10

## 2021-11-11 MED ORDER — MOMETASONE FURO-FORMOTEROL FUM 200-5 MCG/ACT IN AERO
2.0000 | INHALATION_SPRAY | Freq: Two times a day (BID) | RESPIRATORY_TRACT | Status: DC
  Administered 2021-11-11 – 2021-11-14 (×6): 2 via RESPIRATORY_TRACT
  Filled 2021-11-11: qty 8.8

## 2021-11-11 MED ORDER — MENTHOL 3 MG MT LOZG: 1.0000 | LOZENGE | OROMUCOSAL | Status: DC | PRN

## 2021-11-11 MED ORDER — PROPOFOL 10 MG/ML IV BOLUS
INTRAVENOUS | Status: DC | PRN
  Administered 2021-11-11 (×3): 20 mg via INTRAVENOUS

## 2021-11-11 MED ORDER — DOCUSATE SODIUM 100 MG PO CAPS
100.0000 mg | ORAL_CAPSULE | Freq: Two times a day (BID) | ORAL | Status: DC
  Administered 2021-11-11 – 2021-11-14 (×7): 100 mg via ORAL
  Filled 2021-11-11 (×7): qty 1

## 2021-11-11 MED ORDER — ONDANSETRON HCL 4 MG PO TABS: 4.0000 mg | ORAL_TABLET | Freq: Four times a day (QID) | ORAL | Status: DC | PRN

## 2021-11-11 MED ORDER — ACETAMINOPHEN 325 MG PO TABS: 325.0000 mg | ORAL_TABLET | Freq: Four times a day (QID) | ORAL | Status: DC | PRN

## 2021-11-11 MED ORDER — PROPOFOL 10 MG/ML IV BOLUS
INTRAVENOUS | Status: AC
  Filled 2021-11-11: qty 20

## 2021-11-11 MED ORDER — DEXAMETHASONE SODIUM PHOSPHATE 10 MG/ML IJ SOLN
10.0000 mg | Freq: Once | INTRAMUSCULAR | Status: AC
  Administered 2021-11-12: 10 mg via INTRAVENOUS
  Filled 2021-11-11: qty 1

## 2021-11-11 MED ORDER — SERTRALINE HCL 100 MG PO TABS
200.0000 mg | ORAL_TABLET | Freq: Every day | ORAL | Status: DC
  Administered 2021-11-11 – 2021-11-14 (×4): 200 mg via ORAL
  Filled 2021-11-11 (×4): qty 2

## 2021-11-11 MED ORDER — KCL IN DEXTROSE-NACL 20-5-0.45 MEQ/L-%-% IV SOLN
INTRAVENOUS | Status: DC
  Filled 2021-11-11 (×4): qty 1000

## 2021-11-11 MED ORDER — PHENOL 1.4 % MT LIQD: 1.0000 | OROMUCOSAL | Status: DC | PRN

## 2021-11-11 MED ORDER — ADULT MULTIVITAMIN W/MINERALS CH
1.0000 | ORAL_TABLET | Freq: Every day | ORAL | Status: DC
  Administered 2021-11-11 – 2021-11-14 (×4): 1 via ORAL
  Filled 2021-11-11 (×4): qty 1

## 2021-11-11 MED ORDER — METHOCARBAMOL 500 MG IVPB - SIMPLE MED
INTRAVENOUS | Status: AC
  Filled 2021-11-11: qty 50

## 2021-11-11 MED ORDER — VANCOMYCIN HCL IN DEXTROSE 1-5 GM/200ML-% IV SOLN
INTRAVENOUS | Status: AC
  Administered 2021-11-11: 1000 mg via INTRAVENOUS
  Filled 2021-11-11: qty 200

## 2021-11-11 MED ORDER — TRANEXAMIC ACID-NACL 1000-0.7 MG/100ML-% IV SOLN
1000.0000 mg | Freq: Once | INTRAVENOUS | Status: AC
  Administered 2021-11-11: 1000 mg via INTRAVENOUS
  Filled 2021-11-11: qty 100

## 2021-11-11 MED ORDER — HYDROMORPHONE HCL 1 MG/ML IJ SOLN
0.5000 mg | INTRAMUSCULAR | Status: DC | PRN
  Administered 2021-11-11 – 2021-11-12 (×6): 1 mg via INTRAVENOUS
  Filled 2021-11-11 (×6): qty 1

## 2021-11-11 MED ORDER — SODIUM CHLORIDE 0.9 % IV SOLN
INTRAVENOUS | Status: DC | PRN
  Administered 2021-11-11: 60 mL

## 2021-11-11 MED ORDER — ASPIRIN 81 MG PO CHEW
81.0000 mg | CHEWABLE_TABLET | Freq: Two times a day (BID) | ORAL | Status: DC
  Administered 2021-11-11 – 2021-11-14 (×6): 81 mg via ORAL
  Filled 2021-11-11 (×6): qty 1

## 2021-11-11 MED ORDER — LOSARTAN POTASSIUM 50 MG PO TABS: 50.0000 mg | ORAL_TABLET | Freq: Every day | ORAL | Status: DC

## 2021-11-11 MED ORDER — MIDAZOLAM HCL 2 MG/2ML IJ SOLN
INTRAMUSCULAR | Status: DC | PRN
  Administered 2021-11-11: 2 mg via INTRAVENOUS

## 2021-11-11 MED ORDER — ONDANSETRON HCL 4 MG/2ML IJ SOLN
INTRAMUSCULAR | Status: DC | PRN
  Administered 2021-11-11: 4 mg via INTRAVENOUS

## 2021-11-11 MED ORDER — 0.9 % SODIUM CHLORIDE (POUR BTL) OPTIME
TOPICAL | Status: DC | PRN
  Administered 2021-11-11: 1000 mL

## 2021-11-11 MED ORDER — DIPHENHYDRAMINE HCL 12.5 MG/5ML PO ELIX: 12.5000 mg | ORAL_SOLUTION | ORAL | Status: DC | PRN

## 2021-11-11 SURGICAL SUPPLY — 46 items
BAG COUNTER SPONGE SURGICOUNT (BAG) ×2 IMPLANT
BAG DECANTER FOR FLEXI CONT (MISCELLANEOUS) ×4 IMPLANT
BLADE SAW SGTL 18X1.27X75 (BLADE) ×2 IMPLANT
COVER PERINEAL POST (MISCELLANEOUS) ×2 IMPLANT
COVER SURGICAL LIGHT HANDLE (MISCELLANEOUS) ×2 IMPLANT
CUP SECTOR GRIPTON 50MM (Cup) ×1 IMPLANT
DRAPE STERI IOBAN 125X83 (DRAPES) ×2 IMPLANT
DRAPE U-SHAPE 47X51 STRL (DRAPES) ×4 IMPLANT
DRESSING AQUACEL AG SP 3.5X10 (GAUZE/BANDAGES/DRESSINGS) IMPLANT
DRSG AQUACEL AG ADV 3.5X10 (GAUZE/BANDAGES/DRESSINGS) ×2 IMPLANT
DRSG AQUACEL AG SP 3.5X10 (GAUZE/BANDAGES/DRESSINGS) ×2
DURAPREP 26ML APPLICATOR (WOUND CARE) ×2 IMPLANT
ELECT BLADE TIP CTD 4 INCH (ELECTRODE) ×2 IMPLANT
ELECT REM PT RETURN 15FT ADLT (MISCELLANEOUS) ×2 IMPLANT
ELIMINATOR HOLE APEX DEPUY (Hips) ×1 IMPLANT
GLOVE BIO SURGEON STRL SZ7.5 (GLOVE) ×2 IMPLANT
GLOVE BIO SURGEON STRL SZ8.5 (GLOVE) ×2 IMPLANT
GLOVE BIOGEL PI IND STRL 8 (GLOVE) ×1 IMPLANT
GLOVE BIOGEL PI IND STRL 9 (GLOVE) ×1 IMPLANT
GLOVE BIOGEL PI INDICATOR 8 (GLOVE) ×1
GLOVE BIOGEL PI INDICATOR 9 (GLOVE) ×1
GOWN STRL REUS W/ TWL XL LVL3 (GOWN DISPOSABLE) ×2 IMPLANT
GOWN STRL REUS W/TWL XL LVL3 (GOWN DISPOSABLE) ×2
HEAD FEMORAL 32 CERAMIC (Hips) ×1 IMPLANT
HOLDER FOLEY CATH W/STRAP (MISCELLANEOUS) ×2 IMPLANT
KIT TURNOVER KIT A (KITS) IMPLANT
LINER ACET PNNCL PLUS4 NEUTRAL (Hips) IMPLANT
MANIFOLD NEPTUNE II (INSTRUMENTS) ×2 IMPLANT
NDL HYPO 21X1.5 SAFETY (NEEDLE) ×2 IMPLANT
NEEDLE HYPO 21X1.5 SAFETY (NEEDLE) ×4 IMPLANT
NS IRRIG 1000ML POUR BTL (IV SOLUTION) ×2 IMPLANT
PACK ANTERIOR HIP CUSTOM (KITS) ×2 IMPLANT
PINNACLE PLUS 4 NEUTRAL (Hips) ×2 IMPLANT
SPIKE FLUID TRANSFER (MISCELLANEOUS) ×2 IMPLANT
STEM FEM ACTIS HIGH SZ7 (Stem) ×1 IMPLANT
SUT ETHIBOND NAB CT1 #1 30IN (SUTURE) ×2 IMPLANT
SUT VIC AB 0 CT1 27 (SUTURE)
SUT VIC AB 0 CT1 27XBRD ANBCTR (SUTURE) IMPLANT
SUT VIC AB 1 CTX 36 (SUTURE) ×1
SUT VIC AB 1 CTX36XBRD ANBCTR (SUTURE) ×1 IMPLANT
SUT VIC AB 2-0 CT1 27 (SUTURE) ×1
SUT VIC AB 2-0 CT1 TAPERPNT 27 (SUTURE) ×1 IMPLANT
SUT VIC AB 3-0 CT1 27 (SUTURE) ×1
SUT VIC AB 3-0 CT1 TAPERPNT 27 (SUTURE) ×1 IMPLANT
SYR CONTROL 10ML LL (SYRINGE) ×6 IMPLANT
TRAY FOLEY MTR SLVR 16FR STAT (SET/KITS/TRAYS/PACK) IMPLANT

## 2021-11-11 NOTE — Op Note (Addendum)
PATIENT ID:      [name]  ?MRN:     [id number] ?DOB/AGE:    1961/03/23 / 61 y.o. ? ?OPERATIVE REPORT  ? ?DATE OF PROCEDURE:  11/11/2021   ?   ?PREOPERATIVE DIAGNOSIS:  LEFT HIP OSTEOARTHRITIS AVASCULAE NECROSIS  ?                                                       ?POSTOPERATIVE DIAGNOSIS:  Same ?                                                        ?PROCEDURE: Anterior L total hip arthroplasty using a 50 mm DePuy Gryption sector ?Cup, [employer], 0-degree polyethylene liner, a +1 mm x 60mm ceramic head, a 7 Hi Depuy Actis stem ? ?SURGEON: Nestor Lewandowsky  ?ASSISTANT:   Danielle Rankin O-PA  (present throughout entire procedure and necessary for timely completion of the procedure) ?  ?ANESTHESIA: Spinal, Exparel 133mg  injection ?BLOOD LOSS: 400 cc ?FLUID REPLACEMENT: 1600 cc crystalloid ?TRANEXAMIC ACID: 1gm IV, 2gm Topical ?COMPLICATIONS: none  ?  ?INDICATIONS FOR PROCEDURE: A 61 y.o. year-old With  LEFT HIP OSTEOARTHRITIS AVASCULAE NECROSIS   for 3 years, x-rays show bone-on-bone arthritic changes, and osteophytes. Despite conservative measures with observation, anti-inflammatory medicine, narcotics, use of a cane, has severe unremitting pain and can ambulate only a few blocks before resting. Patient desires elective L total hip arthroplasty to decrease pain and increase function. The risks, benefits, and alternatives were discussed at length including but not limited to the risks of infection, bleeding, nerve injury, stiffness, blood clots, the need for revision surgery, cardiopulmonary complications, among others, and they were willing to proceed. Questions answered  ?   ? PROCEDURE IN DETAIL: The patient was identified by armband,   ?received preoperative IV antibiotics in the holding area at [facility]   ?Hospital, taken to the operating room , appropriate anesthetic monitors   ?were attached and anesthesia was induced with the patient on the gurney. HANA boots were applied to the feet, and the  patient  was transferred to the HANA table with a peroneal post and support underneath the non-operative leg. Theoperative lower extremity was then prepped and draped in the usual sterile fashion from just above the iliac crest to the knee. And a timeout procedure was performed. [facility] OPA was present and scrubbed throughout the case, critical for assistance with, positioning, exposure, retraction, instrumentation, and closure.Skin along incision area was injected with 10 cc of Exparel solution. We then made a 13 cm incision along the interval at the leading edge of the tensor fascia lata of starting at 2 cm lateral to the ASIS. Small bleeders in the skin and subcutaneous tissue identified and cauterized we dissected down to the fascia and made an incision in the fascia allowing Danielle Rankin to elevate the fascia of the tensor muscle and exploited the interval between the rectus and the tensor fascia lata. A Cobra retractor was then placed along the superior neck of the femur. A cerebellar retractor was used to expose the interval between the tensor fascia lata and the rectus femoris.  We  identified and cauterized the ascending branch of the anterior circumflex artery. A second Cobra retractor along the inferior neck of the femur. A small Hohmann retractor was placed underneath the origin of the rectus femoris, giving [place] good medial exposure. Using Ronguers fatty tissue was removed from in front of the anterior capsule. The capsule was then incised, starting out at the superior anterior rim of the acetabulum going laterally along the anterior neck. The capsule was then teed along the neck superiorly and inferiorly. Electrocautery was used to release capsule from the anterior and medial neck of the femur to allow external rotation. Cobra retractors were then placed along the inferior and superior neck allowing [place] to perform a standard neck cut and removed the femoral head with a power corkscrew. We then placed a  medium bent homan retractor in the cotyloid notch and posteriorly along the acetabular rim a narrow Cobra retractor. Exposed labral tissue and osteophytes were then removed. We then sequentially reamed up to a 49 mm basket reamer obtaining good coverage in all quadrants, verified by C-arm imaging. Under C-arm control we then hammered into place a 50 mm Gryption sector cup in 45? of abduction and 15? of anteversion. The cup seated nicely and required no supplemental screws. We then placed a central hole Eliminator and a 0? polyethylene liner. The foot was then externally rotated to 130-140?Marland Kitchen The limb was extended and adducted to the floor, delivering the proximal femur up into the wound. A medium curved Hohmann retractor was placed over the greater trochanter and a long Homan retractor along the posterior femoral neck completing the exposure and lateralizing the femur. We then performed releases superiorly and and inferiorly of the capsule going back to the pirformis fossa superiorly and to the lesser trochanter inferiorly. We then entered the proximal femur with the box cutting offset chisel followed by, a canal sounder, the chili pepper and broaching up to a 7 broach. This seated nicely and we reamed the calcar. A trial reduction was performed with a 1 mm X 32 mm head.The limb lengths were excellent the hip was stable in 90? of external rotation. At this point the trial components removed and we hammered into place a # 7 hi  Offset Actis stem with Gryption coating. A + 1 mm x 32 ceramic head was then hammered into place. The hip was reduced and final C-arm images obtained. The wound was thoroughly irrigated with normal saline solution. We repaired the ant capsule and the tensor fascia lot a with running 0 vicryl suture. the subcutaneous tissue was closed with 2-0 and 3-0 Vicryl suture followed by an Aquacil dressing. At this point the patient was awaken and transferred to hospital gurney without difficulty.   ? ?Nestor Lewandowsky ?11/11/2021, 6:42 AM ?  ?

## 2021-11-11 NOTE — Transfer of Care (Signed)
Immediate Anesthesia Transfer of Care Note ? ?Patient: [name] ? ?Procedure(s) Performed: LEFT TOTAL HIP ARTHROPLASTY ANTERIOR APPROACH (Left: Hip) ? ?Patient Location: PACU ? ?Anesthesia Type:Spinal ? ?Level of Consciousness: awake, alert , oriented and patient cooperative ? ?Airway & Oxygen Therapy: Patient Spontanous Breathing and Patient connected to face mask oxygen ? ?Post-op Assessment: Report given to RN and Post -op Vital signs reviewed and stable ? ?Post vital signs: Reviewed and stable ? ?Last Vitals:  ?Vitals Value Taken Time  ?BP 124/73 11/11/21 0924  ?Temp    ?Pulse 96 11/11/21 0926  ?Resp 20 11/11/21 0926  ?SpO2 100 % 11/11/21 0926  ?Vitals shown include unvalidated device data. ? ?Last Pain:  ?Vitals:  ? 11/11/21 0555  ?TempSrc: Oral  ?   ? ?  ? ?Complications: No notable events documented. ?

## 2021-11-11 NOTE — Anesthesia Procedure Notes (Signed)
Procedure Name: [place] ?Date/Time: 11/11/2021 7:23 AM ?Performed by: Lollie Sails, CRNA ?Pre-anesthesia Checklist: Patient identified, Emergency Drugs available, Suction available, Patient being monitored and Timeout performed ?Oxygen Delivery Method: Simple face mask ?Placement Confirmation: positive ETCO2 ? ? ? ? ?

## 2021-11-11 NOTE — Anesthesia Postprocedure Evaluation (Signed)
Anesthesia Post Note ? ?Patient: [name] ? ?Procedure(s) Performed: LEFT TOTAL HIP ARTHROPLASTY ANTERIOR APPROACH (Left: Hip) ? ?  ? ?Patient location during evaluation: PACU ?Anesthesia Type: Spinal and MAC ?Level of consciousness: oriented and awake and alert ?Pain management: pain level controlled ?Vital Signs Assessment: post-procedure vital signs reviewed and stable ?Respiratory status: spontaneous breathing, respiratory function stable and patient connected to nasal cannula oxygen ?Cardiovascular status: blood pressure returned to baseline and stable ?Postop Assessment: no headache, no backache and no apparent nausea or vomiting ?Anesthetic complications: no ? ? ?No notable events documented. ? ?Last Vitals:  ?Vitals:  ? 11/11/21 1051 11/11/21 1225  ?BP: 139/76 115/74  ?Pulse: 92 96  ?Resp: 18 20  ?Temp: 36.8 ?C 36.9 ?C  ?SpO2: 93% 90%  ?  ?Last Pain:  ?Vitals:  ? 11/11/21 1225  ?TempSrc: Oral  ?PainSc:   ? ? ?  ?  ?  ?  ?  ?  ? ?Macala Baldonado ? ? ? ? ?

## 2021-11-11 NOTE — Evaluation (Signed)
Physical Therapy Evaluation ?Patient Details ?Name: [name] ?MRN: [id number] ?DOB: 04-19-1961 ?Today's Date: 11/11/2021 ? ?History of Present Illness ? 61 yo female s/p L DA THA on 11/11/21. PMH: COPD, ETOH use, depression, anxiety, R DA THA, L shoulder surgery, bil CTR  ?Clinical Impression ? Pt is s/p THA resulting in the deficits listed below (see PT Problem List).  ?With encouragement pt agreeable to sitting EOB, she adamantly declined further activity d/t pain L hip and ankle. Noted moderate amount of sanguineous exudate within Aquacel dressing, RN notified.  ?Continue to follow  ? Pt will benefit from skilled PT to increase their independence and safety with mobility to allow discharge to the venue listed below.  ?   ?   ? ?Recommendations for follow up therapy are one component of a multi-disciplinary discharge planning process, led by the attending physician.  Recommendations may be updated based on patient status, additional functional criteria and insurance authorization. ? ?Follow Up Recommendations Follow physician's recommendations for discharge plan and follow up therapies ? ?  ?Assistance Recommended at Discharge Intermittent Supervision/Assistance  ?Patient can return home with the following ? Help with stairs or ramp for entrance;Assist for transportation;Assistance with cooking/housework;A little help with bathing/dressing/bathroom ? ?  ?Equipment Recommendations Rolling walker (2 wheels)  ?Recommendations for Other Services ?    ?  ?Functional Status Assessment Patient has had a recent decline in their functional status and demonstrates the ability to make significant improvements in function in a reasonable and predictable amount of time.  ? ?  ?Precautions / Restrictions Precautions ?Precautions: Fall ?Restrictions ?Weight Bearing Restrictions: No ?Other Position/Activity Restrictions: WBAT  ? ?  ? ?Mobility ? Bed Mobility ?Overal bed mobility: Needs Assistance ?Bed Mobility: Supine to Sit, Sit  to Supine ?  ?  ?Supine to sit: Supervision, Min guard ?Sit to supine: Supervision, Min guard ?  ?General bed mobility comments: for safety, incr time ?  ? ?Transfers ?  ?  ?  ?  ?  ?  ?  ?  ?  ?General transfer comment: pt declined d/t pain ?  ? ?Ambulation/Gait ?  ?  ?  ?  ?  ?  ?  ?  ? ?Stairs ?  ?  ?  ?  ?  ? ?Wheelchair Mobility ?  ? ?Modified Rankin (Stroke Patients Only) ?  ? ?  ? ?Balance   ?Sitting-balance support: No upper extremity supported, Feet supported, Feet unsupported ?Sitting balance-Leahy Scale: Fair ?  ?  ?  ?  ?  ?  ?  ?  ?  ?  ?  ?  ?  ?  ?  ?  ?   ? ? ? ?Pertinent Vitals/Pain Pain Assessment ?Pain Assessment: 0-10 ?Pain Score: 10-Worst pain ever ?Pain Location: L hip adn L ankle ?Pain Descriptors / Indicators: Aching, Constant ?Pain Intervention(s): Limited activity within patient's tolerance, Monitored during session, Premedicated before session, Repositioned, Ice applied  ? ? ?Home Living Family/patient expects to be discharged to:: Private residence ?Living Arrangements: Parent ?Available Help at Discharge: Family;Available 24 hours/day ?Type of Home: House ?Home Access: Ramped entrance ?  ?  ?  ?Home Layout: One level ?Home Equipment: None ?Additional Comments: used her mother's DME in past, now states she needs a RW  ?  ?Prior Function Prior Level of Function : Independent/Modified Independent ?  ?  ?  ?  ?  ?  ?  ?  ?  ? ? ?Hand Dominance  ?   ? ?  ?  Extremity/Trunk Assessment  ? Upper Extremity Assessment ?Upper Extremity Assessment: Overall WFL for tasks assessed ?  ? ?Lower Extremity Assessment ?Lower Extremity Assessment: LLE deficits/detail ?LLE Deficits / Details: pt with c/o L ankle and hip pain, appears df/pf intact but limited testing ?LLE: Unable to fully assess due to pain ?  ? ?   ?Communication  ? Communication: No difficulties  ?Cognition Arousal/Alertness: Awake/alert ?Behavior During Therapy: Restless, Anxious ?Overall Cognitive Status: Within Functional Limits for tasks  assessed ?  ?  ?  ?  ?  ?  ?  ?  ?  ?  ?  ?  ?  ?  ?  ?  ?  ?  ?  ? ?  ?General Comments   ? ?  ?Exercises Total Joint Exercises ?Ankle Circles/Pumps: AROM, Right, 5 reps, Limitations ?Ankle Circles/Pumps Limitations: L ankle pain limting movement  ? ?Assessment/Plan  ?  ?PT Assessment Patient needs continued PT services  ?PT Problem List Decreased strength;Decreased mobility;Decreased range of motion;Decreased activity tolerance;Decreased balance;Decreased knowledge of use of DME;Pain ? ?   ?  ?PT Treatment Interventions DME instruction;Gait training;Functional mobility training;Therapeutic activities;Patient/family education;Therapeutic exercise   ? ?PT Goals (Current goals can be found in the Care Plan section)  ?Acute Rehab PT Goals ?Patient Stated Goal: have less pain ?PT Goal Formulation: With patient ?Time For Goal Achievement: 11/18/21 ?Potential to Achieve Goals: Good ? ?  ?Frequency 7X/week ?  ? ? ?Co-evaluation   ?  ?  ?  ?  ? ? ?  ?AM-PAC PT "6 Clicks" Mobility  ?Outcome Measure Help needed turning from your back to your side while in a flat bed without using bedrails?: None ?Help needed moving from lying on your back to sitting on the side of a flat bed without using bedrails?: None ?Help needed moving to and from a bed to a chair (including a wheelchair)?: A Little ?Help needed standing up from a chair using your arms (e.g., wheelchair or bedside chair)?: A Lot ?Help needed to walk in hospital room?: A Lot ?Help needed climbing 3-5 steps with a railing? : Total ?6 Click Score: 16 ? ?  ?End of Session   ?Activity Tolerance: Patient limited by pain ?Patient left: in bed;with call bell/phone within reach;with bed alarm set ?Nurse Communication: Mobility status ?PT Visit Diagnosis: Other abnormalities of gait and mobility (R26.89);Difficulty in walking, not elsewhere classified (R26.2) ?  ? ?Time: [id number] ?PT Time Calculation (min) (ACUTE ONLY): 13 min ? ? ?Charges:   PT Evaluation ?$PT Eval Low  Complexity: 1 Low ?  ?  ?   ? ? ?Baxter Flattery, PT ? ?Acute Rehab Dept [facility]) [phone] ?Pager [phone] ? ?11/11/2021 ? ? ?Bradley Bostelman ?11/11/2021, 3:02 PM ? ?

## 2021-11-11 NOTE — Interval H&P Note (Signed)
History and Physical Interval Note: ? ?11/11/2021 ?6:41 AM ? ?[name]  has presented today for surgery, with the diagnosis of LEFT HIP OSTEOARTHRITIS AVASCULAE NECROSIS.  The various methods of treatment have been discussed with the patient and family. After consideration of risks, benefits and other options for treatment, the patient has consented to  Procedure(s): ?LEFT TOTAL HIP ARTHROPLASTY ANTERIOR APPROACH (Left) as a surgical intervention.  The patient's history has been reviewed, patient examined, no change in status, stable for surgery.  I have reviewed the patient's chart and labs.  Questions were answered to the patient's satisfaction.   ? ? ?Nestor Lewandowsky ? ? ?

## 2021-11-11 NOTE — Anesthesia Procedure Notes (Signed)
Spinal ? ?Patient location during procedure: OR ?Start time: 11/11/2021 7:24 AM ?End time: 11/11/2021 7:29 AM ?Reason for block: surgical anesthesia ?Staffing ?Performed: resident/CRNA  ?Resident/CRNA: Lollie Sails, CRNA ?Preanesthetic Checklist ?Completed: patient identified, IV checked, site marked, risks and benefits discussed, surgical consent, monitors and equipment checked, pre-op evaluation and timeout performed ?Spinal Block ?Patient position: sitting ?Prep: DuraPrep and site prepped and draped ?Patient monitoring: heart rate, continuous pulse ox, blood pressure and cardiac monitor ?Approach: midline ?Location: L3-4 ?Injection technique: single-shot ?Needle ?Needle type: Pencan  ?Needle gauge: 24 G ?Needle length: 10 cm ?Assessment ?Events: CSF return ?Additional Notes ?Expiration date on kit noted and within range.  Sterile prep and drape.    Local with Lidocaine 1%.  Good CSF flow noted with no heme or c/o paresthesia.   Patient tolerated well.   ? ? ? ?

## 2021-11-11 NOTE — Progress Notes (Signed)
Orthopedic Tech Progress Note ?Patient Details:  ?[name] ?11/03/1960 ?[id number] ? ?Ortho Devices ?Ortho Device/Splint Location: trapeze bar ?Ortho Device/Splint Interventions: Application ?  ?Post Interventions ?Patient Tolerated: Well ?Instructions Provided: Care of device, Adjustment of device ? ?Saul Fordyce ?11/11/2021, 10:41 AM ? ?

## 2021-11-12 ENCOUNTER — Encounter (HOSPITAL_COMMUNITY): Payer: Self-pay | Admitting: Orthopedic Surgery

## 2021-11-12 DIAGNOSIS — M879 Osteonecrosis, unspecified: Secondary | ICD-10-CM | POA: Diagnosis present

## 2021-11-12 DIAGNOSIS — E119 Type 2 diabetes mellitus without complications: Secondary | ICD-10-CM | POA: Diagnosis present

## 2021-11-12 DIAGNOSIS — D62 Acute posthemorrhagic anemia: Secondary | ICD-10-CM | POA: Diagnosis not present

## 2021-11-12 DIAGNOSIS — Z96642 Presence of left artificial hip joint: Secondary | ICD-10-CM

## 2021-11-12 DIAGNOSIS — Z8249 Family history of ischemic heart disease and other diseases of the circulatory system: Secondary | ICD-10-CM | POA: Diagnosis not present

## 2021-11-12 DIAGNOSIS — Z79899 Other long term (current) drug therapy: Secondary | ICD-10-CM | POA: Diagnosis not present

## 2021-11-12 DIAGNOSIS — F411 Generalized anxiety disorder: Secondary | ICD-10-CM | POA: Diagnosis present

## 2021-11-12 DIAGNOSIS — F17210 Nicotine dependence, cigarettes, uncomplicated: Secondary | ICD-10-CM | POA: Diagnosis present

## 2021-11-12 DIAGNOSIS — J449 Chronic obstructive pulmonary disease, unspecified: Secondary | ICD-10-CM | POA: Diagnosis present

## 2021-11-12 DIAGNOSIS — I10 Essential (primary) hypertension: Secondary | ICD-10-CM | POA: Diagnosis present

## 2021-11-12 DIAGNOSIS — M1612 Unilateral primary osteoarthritis, left hip: Secondary | ICD-10-CM | POA: Diagnosis present

## 2021-11-12 DIAGNOSIS — Z90710 Acquired absence of both cervix and uterus: Secondary | ICD-10-CM | POA: Diagnosis not present

## 2021-11-12 DIAGNOSIS — Z881 Allergy status to other antibiotic agents status: Secondary | ICD-10-CM | POA: Diagnosis not present

## 2021-11-12 DIAGNOSIS — Z9981 Dependence on supplemental oxygen: Secondary | ICD-10-CM | POA: Diagnosis not present

## 2021-11-12 DIAGNOSIS — K7460 Unspecified cirrhosis of liver: Secondary | ICD-10-CM | POA: Diagnosis present

## 2021-11-12 DIAGNOSIS — F32A Depression, unspecified: Secondary | ICD-10-CM | POA: Diagnosis present

## 2021-11-12 DIAGNOSIS — Z7982 Long term (current) use of aspirin: Secondary | ICD-10-CM | POA: Diagnosis not present

## 2021-11-12 DIAGNOSIS — K219 Gastro-esophageal reflux disease without esophagitis: Secondary | ICD-10-CM | POA: Diagnosis present

## 2021-11-12 DIAGNOSIS — Z96641 Presence of right artificial hip joint: Secondary | ICD-10-CM | POA: Diagnosis present

## 2021-11-12 DIAGNOSIS — Z7951 Long term (current) use of inhaled steroids: Secondary | ICD-10-CM | POA: Diagnosis not present

## 2021-11-12 LAB — BASIC METABOLIC PANEL
Anion gap: 10 (ref 5–15)
BUN: 15 mg/dL (ref 8–23)
CO2: 23 mmol/L (ref 22–32)
Calcium: 8.6 mg/dL — ABNORMAL LOW (ref 8.9–10.3)
Chloride: 99 mmol/L (ref 98–111)
Creatinine, Ser: 1.85 mg/dL — ABNORMAL HIGH (ref 0.44–1.00)
GFR, Estimated: 31 mL/min — ABNORMAL LOW (ref >60)
Glucose, Bld: 196 mg/dL — ABNORMAL HIGH (ref 70–99)
Potassium: 5.7 mmol/L — ABNORMAL HIGH (ref 3.5–5.1)
Sodium: 132 mmol/L — ABNORMAL LOW (ref 135–145)

## 2021-11-12 LAB — CBC
HCT: 34.9 % — ABNORMAL LOW (ref 36.0–46.0)
Hemoglobin: 10.6 g/dL — ABNORMAL LOW (ref 12.0–15.0)
MCH: 29.3 pg (ref 26.0–34.0)
MCHC: 30.4 g/dL (ref 30.0–36.0)
MCV: 96.4 fL (ref 80.0–100.0)
Platelets: 322 10*3/uL (ref 150–400)
RBC: 3.62 MIL/uL — ABNORMAL LOW (ref 3.87–5.11)
RDW: 13.2 % (ref 11.5–15.5)
WBC: 23.9 10*3/uL — ABNORMAL HIGH (ref 4.0–10.5)
nRBC: 0 % (ref 0.0–0.2)

## 2021-11-12 MED ORDER — DEXTROSE-NACL 5-0.45 % IV SOLN: INTRAVENOUS | Status: DC

## 2021-11-12 MED ORDER — HYDROGEN PEROXIDE 3 % EX SOLN
CUTANEOUS | Status: AC
  Filled 2021-11-12: qty 473

## 2021-11-12 MED ORDER — NICOTINE 21 MG/24HR TD PT24
21.0000 mg | MEDICATED_PATCH | Freq: Every day | TRANSDERMAL | Status: DC
  Administered 2021-11-12 – 2021-11-13 (×2): 21 mg via TRANSDERMAL
  Filled 2021-11-12 (×3): qty 1

## 2021-11-12 NOTE — TOC Initial Note (Signed)
Transition of Care (TOC) - Initial/Assessment Note  ? ?Patient Details  ?Name: [name] ?MRN: [id number] ?Date of Birth: 02-Aug-1961 ? ?Transition of Care (TOC) CM/SW Contact:    ?Ewing Schlein, LCSW ?Phone Number: ?11/12/2021, 1:31 PM ? ?Clinical Narrative: Patient will need a rolling walker, which was delivered to the patient's room by [employer]. Patient is expected to discharge home with HHPT and was initially referred to [facility], but was declined by the agency. CSW made [facility] referrals to the following agencies: ? ?[facility]: declined, at capacity with Medicaid ?[facility]: declined ?[facility]: declined ?[facility]: declined as the agency does not have any Medicaid contracts ?[facility]: declined ?[facility]: declined ?[facility]: declined ?[facility]: no response ?[facility]: accepted ? ?Expected Discharge Plan: Home w Home Health Services ?Barriers to Discharge: No Barriers Identified ? ?Patient Goals and CMS Choice ?Patient states their goals for this hospitalization and ongoing recovery are:: Return home ?CMS Medicare.gov Compare Post Acute Care list provided to:: Patient ?Choice offered to / list presented to : Patient ? ?Expected Discharge Plan and Services ?Expected Discharge Plan: Home w Home Health Services ?In-house Referral: Clinical Social Work ?Post Acute Care Choice: Durable Medical Equipment, Home Health ?Living arrangements for the past 2 months: Single Family Home          ?DME Arranged: [employer] ?DME Agency: [employer] ?Date DME Agency Contacted: 11/12/21 ?Representative spoke with at DME Agency: Prearranged in orthopedist's office ?HH Arranged: PT ?HH [facility]: [facility] ?Date HH Agency Contacted: 11/12/21 ?Time HH Agency Contacted: 1321 ?Representative [facility] with at [facility] Agency: Pearson Grippe ? ?Prior Living Arrangements/Services ?Living arrangements for the past 2 months: Single Family Home ?Patient language and need for interpreter reviewed:: Yes ?Do you feel safe going back to the place  where you live?: Yes      ?Need for Family Participation in Patient Care: No (Comment) ?Care giver support system in place?: Yes (comment) ?Criminal Activity/Legal Involvement Pertinent to Current Situation/Hospitalization: No - Comment as needed ? ?Activities of Daily Living ?Home Assistive Devices/Equipment: None ?ADL Screening (condition at time of admission) ?Patient's cognitive ability adequate to safely complete daily activities?: Yes ?Is the patient deaf or have difficulty hearing?: No ?Does the patient have difficulty seeing, even when wearing glasses/contacts?: No ?Does the patient have difficulty concentrating, remembering, or making decisions?: No ?Patient able to express need for assistance with ADLs?: Yes ?Does the patient have difficulty dressing or bathing?: No ?Independently performs ADLs?: Yes (appropriate for developmental age) ?Does the patient have difficulty walking or climbing stairs?: No ?Weakness of Legs: None ?Weakness of Arms/Hands: None ? ?Permission Sought/Granted ?Permission sought to share information with : Other (comment) ?Permission granted to share information with : Yes, Verbal Permission Granted ?Permission granted to share info w AGENCY: HH agencies ? ?Emotional Assessment ?Appearance:: Appears stated age ?Attitude/Demeanor/Rapport: Guarded ?Affect (typically observed): Accepting ?Orientation: : Oriented to Self, Oriented to Place, Oriented to  Time, Oriented to Situation ?Alcohol / Substance Use: Tobacco Use ? ?Admission diagnosis:  Status post total hip replacement, left [Z96.642] ?Patient Active Problem List  ? Diagnosis Date Noted  ? Status post total hip replacement, left 11/11/2021  ? Osteoarthritis of left hip 11/06/2021  ? Hypoxia   ? Hypokalemia   ? Wheezing   ? Pneumonia 12/31/2020  ? History of total hip arthroplasty, right 12/24/2020  ? Type 2 diabetes mellitus (HCC) 12/18/2020  ? Avascular necrosis of bone of right hip (HCC) 09/17/2020  ? Greater trochanteric pain  syndrome of left lower extremity 07/24/2020  ?  Rheumatoid factor positive 06/26/2020  ? CRP elevated 06/26/2020  ? Bilateral hand pain 06/26/2020  ? Stasis dermatitis of both legs 06/26/2020  ? Alcohol use disorder, severe, in sustained remission (HCC) 04/30/2020  ? Chronic sinusitis 04/10/2020  ? Liver cirrhosis (HCC) 03/08/2020  ? Major depressive disorder, recurrent episode, moderate with anxious distress (HCC) 02/11/2020  ? Alcohol abuse, in remission 12/16/2019  ? Nicotine dependence, cigarettes, uncomplicated 12/16/2019  ? Generalized anxiety disorder 12/16/2019  ? ?PCP:  Georganna Skeans, MD ?Pharmacy:   ?[facility] [phone] - [place], [place] - [phone] AT [facility] [address] ?[address] ?[place] [address] ?Phone: [phone] Fax: [phone] ? ?Readmission Risk Interventions ?   ? View : No data to display.  ?  ?  ?  ? ?

## 2021-11-12 NOTE — Plan of Care (Cosign Needed)
Completed during shift assessment ?

## 2021-11-12 NOTE — Progress Notes (Signed)
Physical Therapy Treatment ?Patient Details ?Name: [name] ?MRN: [id number] ?DOB: 1960/09/10 ?Today's Date: 11/12/2021 ? ? ?History of Present Illness 61 yo female s/p L DA THA on 11/11/21. PMH: COPD, ETOH use, depression, anxiety, R DA THA, L shoulder surgery, bil CTR ? ?  ?PT Comments  ? ? Pt continues to have issues with pain control, difficulty staying awake. Pt is progressing slowly and she is unable to stand again today d/t pain. Sat EOB, cooperated with pad change and allowed RN to change hip dressing after returning to supine. Will continue to follow  ?Recommendations for follow up therapy are one component of a multi-disciplinary discharge planning process, led by the attending physician.  Recommendations may be updated based on patient status, additional functional criteria and insurance authorization. ? ?Follow Up Recommendations ? Follow physician's recommendations for discharge plan and follow up therapies ?  ?  ?Assistance Recommended at Discharge Intermittent Supervision/Assistance  ?Patient can return home with the following Help with stairs or ramp for entrance;Assist for transportation;Assistance with cooking/housework;A little help with bathing/dressing/bathroom ?  ?Equipment Recommendations ? Rolling walker (2 wheels)  ?  ?Recommendations for Other Services   ? ? ?  ?Precautions / Restrictions Precautions ?Precautions: Fall ?Restrictions ?Weight Bearing Restrictions: No ?Other Position/Activity Restrictions: WBAT  ?  ? ?Mobility ? Bed Mobility ?Overal bed mobility: Needs Assistance ?Bed Mobility: Supine to Sit, Sit to Supine ?  ?  ?Supine to sit: Min assist ?Sit to supine: Min assist ?  ?General bed mobility comments: with incr time and encouragement pt able to sit EOB requiring assist for LLE ?  ? ?Transfers ?  ?  ?  ?  ?  ?  ?  ?  ?  ?General transfer comment: pt declined d/t pain, and also pt falling asleep--poor safety awareness. deferred for pt safety ?  ? ?Ambulation/Gait ?  ?  ?  ?  ?  ?   ?  ?  ? ? ?Stairs ?  ?  ?  ?  ?  ? ? ?Wheelchair Mobility ?  ? ?Modified Rankin (Stroke Patients Only) ?  ? ? ?  ?Balance   ?  ?  ?  ?  ?  ?  ?  ?  ?  ?  ?  ?  ?  ?  ?  ?  ?  ?  ?  ? ?  ?Cognition Arousal/Alertness: Awake/alert ?Behavior During Therapy: Restless, Anxious ?Overall Cognitive Status: Difficult to assess ?Area of Impairment: Following commands, Problem solving, Safety/judgement ?  ?  ?  ?  ?  ?  ?  ?  ?  ?  ?  ?Following Commands: Follows one step commands with increased time, Follows multi-step commands inconsistently ?Safety/Judgement: Decreased awareness of safety, Decreased awareness of deficits ?  ?Problem Solving: Decreased initiation, Slow processing, Difficulty sequencing, Requires verbal cues ?General Comments: extremely sleepy, difficulty staying awake falls asleep sitting EOB ?  ?  ? ?  ?Exercises   ? ?  ?General Comments   ?  ?  ? ?Pertinent Vitals/Pain Pain Assessment ?Pain Assessment: 0-10 ?Pain Score: 8  ?Pain Location: L hip and L ankle ?Pain Descriptors / Indicators: Aching, Constant ?Pain Intervention(s): Limited activity within patient's tolerance, Monitored during session, Premedicated before session, Repositioned  ? ? ?Home Living   ?  ?  ?  ?  ?  ?  ?  ?  ?  ?   ?  ?Prior Function    ?  ?  ?   ? ?  PT Goals (current goals can now be found in the care plan section) Acute Rehab PT Goals ?Patient Stated Goal: have less pain ?PT Goal Formulation: With patient ?Time For Goal Achievement: 11/18/21 ?Potential to Achieve Goals: Good ?Progress towards PT goals: Progressing toward goals (slowly) ? ?  ?Frequency ? ? ? 7X/week ? ? ? ?  ?PT Plan Current plan remains appropriate  ? ? ?Co-evaluation   ?  ?  ?  ?  ? ?  ?AM-PAC PT "6 Clicks" Mobility   ?Outcome Measure ? Help needed turning from your back to your side while in a flat bed without using bedrails?: A Little ?Help needed moving from lying on your back to sitting on the side of a flat bed without using bedrails?: A Little ?Help needed  moving to and from a bed to a chair (including a wheelchair)?: A Little ?Help needed standing up from a chair using your arms (e.g., wheelchair or bedside chair)?: Total ?Help needed to walk in hospital room?: Total ?Help needed climbing 3-5 steps with a railing? : Total ?6 Click Score: 12 ? ?  ?End of Session Equipment Utilized During Treatment: Gait belt ?Activity Tolerance: Patient limited by pain;Patient limited by lethargy ?Patient left: in bed;with call bell/phone within reach;with bed alarm set ?Nurse Communication: Mobility status ?PT Visit Diagnosis: Other abnormalities of gait and mobility (R26.89);Difficulty in walking, not elsewhere classified (R26.2) ?  ? ? ?Time: 7253-6644 ?PT Time Calculation (min) (ACUTE ONLY): 25 min ? ?Charges:  $Therapeutic Activity: 23-37 mins          ?          ? Delice Bison, PT ? ?Acute Rehab Dept [facility]) [phone] ?Pager [phone] ? ?11/12/2021 ? ? ? ?[name] ?11/12/2021, 1:25 PM ? ?

## 2021-11-12 NOTE — Progress Notes (Signed)
PATIENT ID: Meya Clutter  MRN: [id number]  DOB/AGE:  62-Dec-1962 / 62 y.o.  ?1 Day Post-Op Procedure(s) (LRB): ?LEFT TOTAL HIP ARTHROPLASTY ANTERIOR APPROACH (Left) ? ?  PROGRESS NOTE ?Subjective: ?Patient is alert, oriented, no Nausea, no Vomiting, yes passing gas, . Taking PO well. ?Denies SOB, Chest or Calf Pain. Using Incentive Spirometer, PAS in place. ?Ambulate WBAT ?Patient reports pain as  4/10  .   ? ?Objective: ?Vital signs in last 24 hours: ?Vitals:  ? 11/11/21 2031 11/11/21 2339 11/12/21 0129 11/12/21 0615  ?BP: (!) 119/52 126/77 134/84 110/69  ?Pulse: (!) 110 (!) 114 (!) 116 (!) 110  ?Resp: 20 17 17 17   ?Temp: 98.3 ?F (36.8 ?C) 98.4 ?F (36.9 ?C) (!) 97.5 ?F (36.4 ?C) 98.4 ?F (36.9 ?C)  ?TempSrc: Oral Oral Oral Oral  ?SpO2: 93% (!) 85% (!) 89% 90%  ?Weight:      ?Height:      ?  ?  ?Intake/Output from previous day: ?I/O last 3 completed shifts: ?In: 4101.5 [P.O.:840; I.V.:2911.5; IV Piggyback:350] ?Out: 1875 [Urine:1425; Blood:450] ?  ?Intake/Output this shift: ?No intake/output data recorded.  ? ?LABORATORY DATA: ?Recent Labs  ?  11/12/21 ?01/12/22  ?WBC 23.9*  ?HGB 10.6*  ?HCT 34.9*  ?PLT 322  ?NA 132*  ?K 5.7*  ?CL 99  ?CO2 23  ?BUN 15  ?CREATININE 1.85*  ?GLUCOSE 196*  ?CALCIUM 8.6*  ? ? ?Examination: ?Neurologically intact ?ABD soft ?Neurovascular intact ?Sensation intact distally ?Intact pulses distally ?Dorsiflexion/Plantar flexion intact ?Incision: moderate drainage ?No cellulitis present ?Compartment soft} ?XR AP&Lat of hip shows well placed\fixed THA ? ?Assessment:   ?1 Day Post-Op Procedure(s) (LRB): ?LEFT TOTAL HIP ARTHROPLASTY ANTERIOR APPROACH (Left) ?ADDITIONAL DIAGNOSIS:  Expected Acute Blood Loss Anemia, COPD, wheezing, hx ETOH and tobacco abuse, rheumatoid, hx depression, Anxiety. ? ?Patient's anticipated LOS is less than 2 midnights, meeting these requirements: ?- Younger than 65 ?- Lives within 1 hour of care ?- Has a competent adult at home to recover with post-op recover ?- NO history  of ? - Chronic pain requiring opiods ? - Diabetes ? - Coronary Artery Disease ? - Heart failure ? - Heart attack ? - Stroke ? - DVT/VTE ? - Cardiac arrhythmia ? - Respiratory Failure/COPD ? - Renal failure ? - Anemia ? - Advanced Liver disease ? ?  ? ?Plan: ?PT/OT WBAT, THA ? ?DVT Prophylaxis: SCDx72 hrs, ASA 81 mg BID x 2 weeks ? ?DISCHARGE PLAN: Home, when passes PT ? ?DISCHARGE NEEDS: HHPT, Walker, and 3-in-1 comode seat Patient ID: Lashone Stauber, female   DOB: February 20, 1961, 61 y.o.   MRN: [id number] ? ?

## 2021-11-12 NOTE — Progress Notes (Signed)
PT NOTE-pm tx ? 11/12/21 1500  ?PT Visit Information  ?Last PT Received On 11/12/21 ?Pt continues to complain of severe pain, declines mobility and is also extremely lethargic  ?Assistance Needed +1  ?History of Present Illness 61 yo female s/p L DA THA on 11/11/21. PMH: COPD, ETOH use, depression, anxiety, R DA THA, L shoulder surgery, bil CTR  ?Subjective Data  ?Patient Stated Goal have less pain  ?Precautions  ?Precautions Fall  ?Restrictions  ?Weight Bearing Restrictions No  ?Other Position/Activity Restrictions WBAT  ?Pain Assessment  ?Pain Assessment 0-10  ?Pain Score 5  ?Pain Location L hip and L ankle  ?Pain Descriptors / Indicators Aching;Constant  ?Pain Intervention(s) Limited activity within patient's tolerance;Monitored during session;Premedicated before session;Repositioned  ?Cognition  ?Arousal/Alertness Lethargic;Suspect due to medications  ?Behavior During Therapy Restless ?(when alert)  ?Overall Cognitive Status Difficult to assess  ?Area of Impairment Following commands;Problem solving;Safety/judgement  ?Following Commands Follows one step commands with increased time;Follows multi-step commands inconsistently  ?Safety/Judgement Decreased awareness of safety;Decreased awareness of deficits  ?Problem Solving Decreased initiation;Slow processing;Difficulty sequencing;Requires verbal cues;Requires tactile cues  ?General Comments extremely sleepy, difficulty staying awake falls asleep sitting EOB  ?Difficult to assess due to Level of arousal  ?Bed Mobility  ?General bed mobility comments too lethargic to attempt mobility and pt refuses when alert "that is not happening"  ?Transfers  ?General transfer comment pt declined d/t pain, and also pt falling asleep--poor safety awareness. deferred for pt safety  ?Total Joint Exercises  ?Ankle Circles/Pumps AROM;Both;10 reps;Supine;AAROM  ?Quad Sets AROM;Strengthening;Both;10 reps  ?Gluteal Sets AROM;Both;10 reps  ?Heel Slides 10 reps;Left;AAROM;Limitations  ?Hip  ABduction/ADduction AAROM;10 reps;Left;Limitations  ?Heel Slides Limitations verbal cues to stay laert and participate  ?Hip Abduction/Adduction Limitations same as above + pain  ?PT - End of Session  ?Equipment Utilized During Treatment Gait belt  ?Activity Tolerance Patient limited by pain;Patient limited by lethargy  ?Patient left in bed;with call bell/phone within reach;with bed alarm set  ?Nurse Communication Mobility status  ? PT - Assessment/Plan  ?PT Plan Current plan remains appropriate  ?PT Visit Diagnosis Other abnormalities of gait and mobility (R26.89);Difficulty in walking, not elsewhere classified (R26.2)  ?PT Frequency (ACUTE ONLY) 7X/week  ?Follow Up Recommendations Follow physician's recommendations for discharge plan and follow up therapies  ?Assistance recommended at discharge Intermittent Supervision/Assistance  ?Patient can return home with the following Help with stairs or ramp for entrance;Assist for transportation;Assistance with cooking/housework;A little help with bathing/dressing/bathroom  ?PT equipment Rolling walker (2 wheels)  ?AM-PAC PT "6 Clicks" Mobility Outcome Measure (Version 2)  ?Help needed turning from your back to your side while in a flat bed without using bedrails? 3  ?Help needed moving from lying on your back to sitting on the side of a flat bed without using bedrails? 3  ?Help needed moving to and from a bed to a chair (including a wheelchair)? 3  ?Help needed standing up from a chair using your arms (e.g., wheelchair or bedside chair)? 1  ?Help needed to walk in hospital room? 1  ?Help needed climbing 3-5 steps with a railing?  1  ?6 Click Score 12  ?Consider Recommendation of Discharge To: CIR/SNF/LTACH  ?Progressive Mobility  ?What is the highest level of mobility based on the progressive mobility assessment? Level 2 (Chairfast) - Balance while sitting on edge of bed and cannot stand  ?PT Goal Progression  ?Progress towards PT goals Not progressing toward goals -  comment ?(lethargic)  ?Acute Rehab PT Goals  ?PT Goal  Formulation With patient  ?Time For Goal Achievement 11/18/21  ?Potential to Achieve Goals Good  ?PT Time Calculation  ?PT Start Time (ACUTE ONLY) 1525  ?PT Stop Time (ACUTE ONLY) 1551  ?PT Time Calculation (min) (ACUTE ONLY) 26 min  ?PT General Charges  ?$$ ACUTE PT VISIT 1 Visit  ?PT Treatments  ?$Therapeutic Exercise 23-37 mins  ?Baxter Flattery, PT ? ?Acute Rehab Dept [facility]) [phone] ?Pager [phone] ? ?11/12/2021 ? ?

## 2021-11-13 LAB — CBC
HCT: 28.3 % — ABNORMAL LOW (ref 36.0–46.0)
Hemoglobin: 9.1 g/dL — ABNORMAL LOW (ref 12.0–15.0)
MCH: 29.9 pg (ref 26.0–34.0)
MCHC: 32.2 g/dL (ref 30.0–36.0)
MCV: 93.1 fL (ref 80.0–100.0)
Platelets: 258 10*3/uL (ref 150–400)
RBC: 3.04 MIL/uL — ABNORMAL LOW (ref 3.87–5.11)
RDW: 13 % (ref 11.5–15.5)
WBC: 18 10*3/uL — ABNORMAL HIGH (ref 4.0–10.5)
nRBC: 0 % (ref 0.0–0.2)

## 2021-11-13 MED ORDER — TRAMADOL HCL 50 MG PO TABS
50.0000 mg | ORAL_TABLET | Freq: Four times a day (QID) | ORAL | Status: DC | PRN
  Administered 2021-11-13 – 2021-11-14 (×2): 100 mg via ORAL
  Filled 2021-11-13 (×2): qty 2

## 2021-11-13 NOTE — Progress Notes (Signed)
Physical Therapy Treatment ?Patient Details ?Name: [name] ?MRN: [id number] ?DOB: [birth year] ?Today's Date: 11/13/2021 ? ? ?History of Present Illness 61 yo female s/p L DA THA on 11/11/21. PMH: COPD, ETOH use, depression, anxiety, R DA THA, L shoulder surgery, bil CTR ? ?  ?PT Comments  ? ? Pt progressing slowly with PT. Much more alert. With encouragement pt able to progress to OOB activity/ambulation short distance today. Pt not agreeable to greater distance/amb outside room today. Will continue efforts to mobilize pt ?  ?Recommendations for follow up therapy are one component of a multi-disciplinary discharge planning process, led by the attending physician.  Recommendations may be updated based on patient status, additional functional criteria and insurance authorization. ? ?Follow Up Recommendations ? Follow physician's recommendations for discharge plan and follow up therapies ?  ?  ?Assistance Recommended at Discharge Intermittent Supervision/Assistance  ?Patient can return home with the following Help with stairs or ramp for entrance;Assist for transportation;Assistance with cooking/housework;A little help with bathing/dressing/bathroom ?  ?Equipment Recommendations ? Rolling walker (2 wheels) (delivered)  ?  ?Recommendations for Other Services   ? ? ?  ?Precautions / Restrictions Precautions ?Precautions: Fall ?Restrictions ?Weight Bearing Restrictions: No ?Other Position/Activity Restrictions: WBAT  ?  ? ?Mobility ? Bed Mobility ?Overal bed mobility: Needs Assistance ?Bed Mobility: Supine to Sit ?  ?  ?Supine to sit: Min assist ?  ?  ?General bed mobility comments: incr time and effort. cues to self assist and sequence. assist with LLE ?  ? ?Transfers ?Overall transfer level: Needs assistance ?Equipment used: Rolling walker (2 wheels) ?Transfers: Sit to/from Stand ?Sit to Stand: Min assist ?  ?  ?  ?  ?  ?General transfer comment: verbal cues for hand placement and overall safety ?   ? ?Ambulation/Gait ?Ambulation/Gait assistance: Min assist ?Gait Distance (Feet): 15 Feet (x2) ?Assistive device: Rolling walker (2 wheels) ?Gait Pattern/deviations: Step-to pattern, Wide base of support ?  ?  ?  ?General Gait Details: incr time, multi-modal cues for sequence, RW position from self and to stay on task ? ? ?Stairs ?  ?  ?  ?  ?  ? ? ?Wheelchair Mobility ?  ? ?Modified Rankin (Stroke Patients Only) ?  ? ? ?  ?Balance   ?  ?  ?  ?  ?  ?  ?  ?  ?  ?  ?  ?  ?  ?  ?  ?  ?  ?  ?  ? ?  ?Cognition Arousal/Alertness: Awake/alert ?Behavior During Therapy: Restless ?  ?Area of Impairment: Following commands ?  ?  ?  ?  ?  ?  ?  ?  ?  ?  ?  ?Following Commands: Follows one step commands with increased time, Follows multi-step commands inconsistently ?Safety/Judgement: Decreased awareness of safety ?  ?Problem Solving: Difficulty sequencing, Requires verbal cues ?  ?  ?  ? ?  ?Exercises   ? ?  ?General Comments   ?  ?  ? ?Pertinent Vitals/Pain Pain Assessment ?Pain Assessment: 0-10 ?Pain Score: 4  ?Pain Location: L hip and L ankle ?Pain Descriptors / Indicators: Aching, Constant ?Pain Intervention(s): Limited activity within patient's tolerance, Monitored during session, Repositioned  ? ? ?Home Living   ?  ?  ?  ?  ?  ?  ?  ?  ?  ?   ?  ?Prior Function    ?  ?  ?   ? ?PT Goals (current goals can  now be found in the care plan section) Acute Rehab PT Goals ?Patient Stated Goal: have less pain ?PT Goal Formulation: With patient ?Time For Goal Achievement: 11/18/21 ?Potential to Achieve Goals: Good ?Progress towards PT goals: Progressing toward goals (slowly) ? ?  ?Frequency ? ? ? 7X/week ? ? ? ?  ?PT Plan Current plan remains appropriate  ? ? ?Co-evaluation   ?  ?  ?  ?  ? ?  ?AM-PAC PT "6 Clicks" Mobility   ?Outcome Measure ? Help needed turning from your back to your side while in a flat bed without using bedrails?: A Little ?Help needed moving from lying on your back to sitting on the side of a flat bed without  using bedrails?: A Little ?Help needed moving to and from a bed to a chair (including a wheelchair)?: A Little ?Help needed standing up from a chair using your arms (e.g., wheelchair or bedside chair)?: A Little ?Help needed to walk in hospital room?: A Little ?Help needed climbing 3-5 steps with a railing? : A Lot ?6 Click Score: 17 ? ?  ?End of Session Equipment Utilized During Treatment: Gait belt ?Activity Tolerance: Patient tolerated treatment well;Patient limited by fatigue ?Patient left: in chair;with call bell/phone within reach;with chair alarm set ?Nurse Communication: Mobility status ?PT Visit Diagnosis: Other abnormalities of gait and mobility (R26.89);Difficulty in walking, not elsewhere classified (R26.2) ?  ? ? ?Time: 4098-1191 ?PT Time Calculation (min) (ACUTE ONLY): 15 min ? ?Charges:  $Gait Training: 8-22 mins          ?          ? [name], PT ? ?Acute Rehab Dept [facility]) [phone] ?Pager [phone] ? ?11/13/2021 ? ? ? ?[name] ?11/13/2021, 3:35 PM ? ?

## 2021-11-13 NOTE — Plan of Care (Cosign Needed)
Completed during shift assessment ?

## 2021-11-13 NOTE — Progress Notes (Signed)
PT TX NOTE-pm session -POD #2 ? 11/13/21 1600  ?PT Visit Information  ?Last PT Received On 11/13/21  ?Assistance Needed +1 ? ?Pt back in bed, agreeable to exercises. [place]leigh was able to sustain focus, appropriate level of alertness and participate fully with THA exercise program.  ?We have discussed that tomorrow the plan is to incr ambulation distance/hallway amb.   ?History of Present Illness 61 yo female s/p L DA THA on 11/11/21. PMH: COPD, ETOH use, depression, anxiety, R DA THA, L shoulder surgery, bil CTR  ?Subjective Data  ?Patient Stated Goal have less pain  ?Precautions  ?Precautions Fall  ?Restrictions  ?Other Position/Activity Restrictions WBAT  ?Pain Assessment  ?Pain Assessment 0-10  ?Pain Score 5  ?Pain Location L hip  ?Pain Descriptors / Indicators Aching;Burning;Constant  ?Pain Intervention(s) Limited activity within patient's tolerance;Monitored during session;Premedicated before session  ?Cognition  ?Arousal/Alertness Awake/alert  ?Behavior During Therapy Restless  ?Overall Cognitive Status Within Functional Limits for tasks assessed  ?General Comments improved attention and ability to follow commands for HEP this pm  ?Total Joint Exercises  ?Ankle Circles/Pumps AROM;Both;10 reps;Supine;AAROM  ?Quad Sets AROM;Strengthening;Both;10 reps  ?Gluteal Sets AROM;Both;10 reps  ?Heel Slides 10 reps;Left;AAROM;Limitations  ?Hip ABduction/ADduction AROM;AAROM;10 reps;Left  ?Short Arc Quad AROM;Left;10 reps  ?PT - End of Session  ?Activity Tolerance Patient tolerated treatment well  ?Patient left in bed;with call bell/phone within reach;with bed alarm set  ?Nurse Communication Mobility status  ? PT - Assessment/Plan  ?PT Plan Current plan remains appropriate  ?PT Visit Diagnosis Other abnormalities of gait and mobility (R26.89);Difficulty in walking, not elsewhere classified (R26.2)  ?PT Frequency (ACUTE ONLY) 7X/week  ?Follow Up Recommendations Follow physician's recommendations for discharge plan and follow up  therapies  ?Assistance recommended at discharge Intermittent Supervision/Assistance  ?Patient can return home with the following Help with stairs or ramp for entrance;Assist for transportation;Assistance with cooking/housework;A little help with bathing/dressing/bathroom  ?PT equipment Rolling walker (2 wheels);Other (comment) ?(delivered)  ?AM-PAC PT "6 Clicks" Mobility Outcome Measure (Version 2)  ?Help needed turning from your back to your side while in a flat bed without using bedrails? 3  ?Help needed moving from lying on your back to sitting on the side of a flat bed without using bedrails? 3  ?Help needed moving to and from a bed to a chair (including a wheelchair)? 3  ?Help needed standing up from a chair using your arms (e.g., wheelchair or bedside chair)? 3  ?Help needed to walk in hospital room? 3  ?Help needed climbing 3-5 steps with a railing?  2  ?6 Click Score 17  ?Consider Recommendation of Discharge To: Home with HH  ?Progressive Mobility  ?What is the highest level of mobility based on the progressive mobility assessment? Level 4 (Walks with assist in room) - Balance while marching in place and cannot step forward and back - Complete  ?PT Goal Progression  ?Progress towards PT goals Progressing toward goals  ?Acute Rehab PT Goals  ?PT Goal Formulation With patient  ?Time For Goal Achievement 11/18/21  ?Potential to Achieve Goals Good  ?PT Time Calculation  ?PT Start Time (ACUTE ONLY) 1600  ?PT Stop Time (ACUTE ONLY) 1617  ?PT Time Calculation (min) (ACUTE ONLY) 17 min  ?PT General Charges  ?$$ ACUTE PT VISIT 1 Visit  ?PT Treatments  ?$Therapeutic Exercise 8-22 mins  ? ? ?

## 2021-11-13 NOTE — TOC Progression Note (Signed)
Transition of Care (TOC) - Progression Note  ? ?Patient Details  ?Name: [name] ?MRN: [id number] ?Date of Birth: 1961/07/02 ? ?Transition of Care (TOC) CM/SW Contact  ?Ewing Schlein, LCSW ?Phone Number: ?11/13/2021, 11:17 AM ? ?Clinical Narrative: CSW updated patient that [facility] accepted the HHPT referral. ? ?Expected Discharge Plan: Home w Home Health Services ?Barriers to Discharge: No Barriers Identified ? ?Expected Discharge Plan and Services ?Expected Discharge Plan: Home w Home Health Services ?In-house Referral: Clinical Social Work ?Post Acute Care Choice: Durable Medical Equipment, Home Health ?Living arrangements for the past 2 months: Single Family Home            ?DME Arranged: [employer] ?DME Agency: [employer] ?Date DME Agency Contacted: 11/12/21 ?Representative spoke with at DME Agency: Prearranged in orthopedist's office ?HH Arranged: PT ?HH Agency: [facility] ?Date HH Agency Contacted: 11/12/21 ?Time HH Agency Contacted: 1321 ?Representative spoke with at [facility] Agency: Pearson Grippe ? ?Readmission Risk Interventions ?   ? View : No data to display.  ?  ?  ?  ? ?

## 2021-11-13 NOTE — Progress Notes (Signed)
Patient reports calling nursing staff around 1430 to go to the bathroom.  ? ?During 1500 rounds, we observed patient in chair, and urine from the patients chair all the way to the threshold of the patients door, approximately 6 feet of urine across the floor.  ? ?When I asked the patient why she did not call us and ask to use the bathroom, she said "I did" ? ?I told her that we had not received the call, and there was not a call in the call log completed by the unit secretary.  ? ? ?The patient requested to back to bed, and refused to stay up in the chair any longer. I educated her about the risk of developing pneumonia. She said, okay, and laid back in the bed. She also requested the purewick. I told her during the day she needed to be getting out of bed and using the [facility], or ambulating to the bathroom. I told her that we would not be using the purewick this afternoon, and that she could have is tonight for bedtime.  ? ? This afternoon was the patient's first time OOB since before surgery.  ? ? ?I informed the MD about her poor mobility, and the constant sleeping. He decided it was best to discontinue her strong narcotic pain medications. I told the patient the only pain medications I had for her was tramadol.  ? ? ?Patient refused her afternoon medications as well.  ? ?Will continue to monitor and assess patient, offer education and support her care.  ? ? ?S[place]ittemore, RN ?

## 2021-11-13 NOTE — Progress Notes (Addendum)
PATIENT ID: [name]  MRN: [id number]  DOB/AGE:  1961-03-19 / 61 y.o.  ?2 Days Post-Op Procedure(s) (LRB): ?LEFT TOTAL HIP ARTHROPLASTY ANTERIOR APPROACH (Left) ? ?  PROGRESS NOTE ?Subjective: ?Patient is alert, oriented, no Nausea, no Vomiting, yes passing gas, . Taking PO well. ?Denies SOB, Chest or Calf Pain. Using Incentive Spirometer, PAS in place. ?Ambulate WBAT ?Patient reports pain as  2/10  .   ? ?Objective: ?Vital signs in last 24 hours: ?Vitals:  ? 11/12/21 1935 11/12/21 2148 11/13/21 0645 11/13/21 0818  ?BP:  (!) 121/49 (!) 121/54   ?Pulse:  (!) 104 (!) 108   ?Resp:  17 17   ?Temp:  98 ?F (36.7 ?C) 99.1 ?F (37.3 ?C)   ?TempSrc:  Oral Oral   ?SpO2: 92% 94% 96% 97%  ?Weight:      ?Height:      ?  ?  ?Intake/Output from previous day: ?I/O last 3 completed shifts: ?In: 1267.2 [P.O.:120; I.V.:1147.2] ?Out: 2650 [Urine:2650] ?  ?Intake/Output this shift: ?No intake/output data recorded.  ? ?LABORATORY DATA: ?Recent Labs  ?  11/12/21 ?[id number] 11/13/21 ?0346  ?WBC 23.9* 18.0*  ?HGB 10.6* 9.1*  ?HCT 34.9* 28.3*  ?PLT 322 258  ?NA 132*  --   ?K 5.7*  --   ?CL 99  --   ?CO2 23  --   ?BUN 15  --   ?CREATININE 1.85*  --   ?GLUCOSE 196*  --   ?CALCIUM 8.6*  --   ? ? ?Examination: ?Neurologically intact ?ABD soft ?Neurovascular intact ?Sensation intact distally ?Intact pulses distally ?Dorsiflexion/Plantar flexion intact ?Incision: dressing C/D/I ?No cellulitis present ?Compartment soft} ?XR AP&Lat of hip shows well placed\fixed THA ? ?Assessment:   ?2 Days Post-Op Procedure(s) (LRB): ?LEFT TOTAL HIP ARTHROPLASTY ANTERIOR APPROACH (Left) ?ADDITIONAL DIAGNOSIS:  Expected Acute Blood Loss Anemia, COPD, Depression, Anxiety, Cirrhosis, RA, hx ETOH & Tobacco abuse ?Anticipated LOS equal to or greater than 2 midnights due to ?- Age 63 and older with one or more of the following: ? - Obesity ? - Expected need for hospital services (PT, OT, Nursing) required for safe  discharge ? - Anticipated need for postoperative skilled  nursing care or inpatient rehab ? - Slow progress with PT ? ? ? ? ?Plan: ?PT/OT WBAT, THA ? ?DVT Prophylaxis: SCDx72 hrs, ASA 81 mg BID x 2 weeks ? ?DISCHARGE PLAN: Home, when passes PT, hopefully today. ? ?DISCHARGE NEEDS: HHPT, Walker, and 3-in-1 comode seat Patient ID: Annelies Helgerson, female   DOB: 1961/02/21, 61 y.o.   MRN: [id number] ? ?

## 2021-11-14 ENCOUNTER — Other Ambulatory Visit: Payer: Self-pay | Admitting: Family Medicine

## 2021-11-14 DIAGNOSIS — K219 Gastro-esophageal reflux disease without esophagitis: Secondary | ICD-10-CM

## 2021-11-14 LAB — BASIC METABOLIC PANEL
Anion gap: 6 (ref 5–15)
BUN: 14 mg/dL (ref 8–23)
CO2: 30 mmol/L (ref 22–32)
Calcium: 8.9 mg/dL (ref 8.9–10.3)
Chloride: 100 mmol/L (ref 98–111)
Creatinine, Ser: 0.65 mg/dL (ref 0.44–1.00)
GFR, Estimated: >60 mL/min (ref >60)
Glucose, Bld: 154 mg/dL — ABNORMAL HIGH (ref 70–99)
Potassium: 4.1 mmol/L (ref 3.5–5.1)
Sodium: 136 mmol/L (ref 135–145)

## 2021-11-14 LAB — CBC
HCT: 24.4 % — ABNORMAL LOW (ref 36.0–46.0)
Hemoglobin: 8.1 g/dL — ABNORMAL LOW (ref 12.0–15.0)
MCH: 30.2 pg (ref 26.0–34.0)
MCHC: 33.2 g/dL (ref 30.0–36.0)
MCV: 91 fL (ref 80.0–100.0)
Platelets: 252 10*3/uL (ref 150–400)
RBC: 2.68 MIL/uL — ABNORMAL LOW (ref 3.87–5.11)
RDW: 13 % (ref 11.5–15.5)
WBC: 12.5 10*3/uL — ABNORMAL HIGH (ref 4.0–10.5)
nRBC: 0 % (ref 0.0–0.2)

## 2021-11-14 MED ORDER — TIZANIDINE HCL 2 MG PO CAPS: 4.0000 mg | ORAL_CAPSULE | Freq: Three times a day (TID) | ORAL | 0 refills | Status: DC | PRN

## 2021-11-14 NOTE — Progress Notes (Signed)
Physical Therapy Treatment ?Patient Details ?Name: [name] ?MRN: [id number] ?DOB: [birth year] ?Today's Date: 11/14/2021 ? ? ?History of Present Illness 61 yo female s/p L DA THA on 11/11/21. PMH: COPD, ETOH use, depression, anxiety, R DA THA, L shoulder surgery, bil CTR ? ?  ?PT Comments  ? ? POD # 3 pm session ?General Comments: pt appears AxO x 3 but during session required repeat VC's related to safety as well as instructions(new).  easily distracted.  "I will do better once I'm home". ?General transfer comment: Pt attempting to stand before I could grab her walker.  50% VC's on safety.  Pt eager to go home.  Pt stated she amb with NT to bathroom earlier and "it went fine". General Gait Details: asissted with amb in hallway a limited distance of 18 feet.  standing BP was better 117/69.  No c/o dizziness.  RA also improved to 96%.  Educated pt on importance of mobility once home. Then returned to room to perform some TE's following HEP handout.  Instructed on proper tech, freq as well as use of ICE.   ?Addressed all mobility questions, discussed appropriate activity, educated on use of ICE.  Pt ready for D/C to home. ?  ?Recommendations for follow up therapy are one component of a multi-disciplinary discharge planning process, led by the attending physician.  Recommendations may be updated based on patient status, additional functional criteria and insurance authorization. ? ?Follow Up Recommendations ? Follow physician's recommendations for discharge plan and follow up therapies ?  ?  ?Assistance Recommended at Discharge Intermittent Supervision/Assistance  ?Patient can return home with the following Help with stairs or ramp for entrance;Assist for transportation;Assistance with cooking/housework;A little help with bathing/dressing/bathroom ?  ?Equipment Recommendations ? Rolling walker (2 wheels)  ?  ?Recommendations for Other Services   ? ? ?  ?Precautions / Restrictions Precautions ?Precautions:  Fall ?Restrictions ?Weight Bearing Restrictions: No ?Other Position/Activity Restrictions: WBAT  ?  ? ?Mobility ? Bed Mobility ?  ?  ?  ?General bed mobility comments: OOB in recliner ?  ? ?Transfers ?Overall transfer level: Needs assistance ?Equipment used: Rolling walker (2 wheels) ?Transfers: Sit to/from Stand ?Sit to Stand: Min guard, Min assist ?  ?  ?  ?  ?  ?General transfer comment: Pt attempting to stand before I could grab her walker.  50% VC's on safety.  Pt eager to go home.  Pt stated she amb with NT to bathroom earlier and "it went fine". ?  ? ?Ambulation/Gait ?Ambulation/Gait assistance: Supervision, Min guard ?Gait Distance (Feet): 18 Feet ?Assistive device: Rolling walker (2 wheels) ?Gait Pattern/deviations: Step-to pattern, Wide base of support ?Gait velocity: decreased ?  ?  ?General Gait Details: asissted with amb in hallway a limited distance of 18 feet.  standing BP was better 117/69.  No c/o dizziness.  RA also improved to 96%.  Educated pt on importance of mobility once home. ? ? ?Stairs ?  ?  ?  ?  ?  ? ? ?Wheelchair Mobility ?  ? ?Modified Rankin (Stroke Patients Only) ?  ? ? ?  ?Balance   ?  ?  ?  ?  ?  ?  ?  ?  ?  ?  ?  ?  ?  ?  ?  ?  ?  ?  ?  ? ?  ?Cognition Arousal/Alertness: Awake/alert ?Behavior During Therapy: Restless ?Overall Cognitive Status: No family/caregiver present to determine baseline cognitive functioning ?  ?  ?  ?  ?  ?  ?  ?  ?  ?  ?  ?  ?  ?  ?  ?  ?  General Comments: pt appears AxO x 3 but during session required repeat VC's related to safety as well as instructions(new).  easily distracted.  "I will do better once I'm home". ?  ?  ? ?  ?Exercises  Total Hip Replacement TE's following HEP Handout ?10 reps ankle pumps ?05 reps knee presses ?05 reps heel slides ?05 reps SAQ's ?05 reps ABD ?Instructed how to use a belt loop to assist  ?Followed by ICE ? ? ?  ?General Comments   ?  ?  ? ?Pertinent Vitals/Pain Pain Assessment ?Pain Assessment: 0-10 ?Pain Score: 8  ?Pain  Location: L hip ?Pain Descriptors / Indicators: Aching, Burning, Constant ?Pain Intervention(s): Monitored during session, Premedicated before session, Repositioned, Ice applied  ? ? ?Home Living   ?  ?  ?  ?  ?  ?  ?  ?  ?  ?   ?  ?Prior Function    ?  ?  ?   ? ?PT Goals (current goals can now be found in the care plan section) Progress towards PT goals: Progressing toward goals ? ?  ?Frequency ? ? ? 7X/week ? ? ? ?  ?PT Plan Current plan remains appropriate  ? ? ?Co-evaluation   ?  ?  ?  ?  ? ?  ?AM-PAC PT "6 Clicks" Mobility   ?Outcome Measure ? Help needed turning from your back to your side while in a flat bed without using bedrails?: A Little ?Help needed moving from lying on your back to sitting on the side of a flat bed without using bedrails?: A Little ?Help needed moving to and from a bed to a chair (including a wheelchair)?: A Little ?Help needed standing up from a chair using your arms (e.g., wheelchair or bedside chair)?: A Little ?Help needed to walk in hospital room?: A Little ?Help needed climbing 3-5 steps with a railing? : A Lot ?6 Click Score: 17 ? ?  ?End of Session Equipment Utilized During Treatment: Gait belt ?Activity Tolerance: Patient limited by fatigue;Other (comment) (dizziness) ?Patient left: in chair;with call bell/phone within reach ?Nurse Communication: Mobility status ?PT Visit Diagnosis: Other abnormalities of gait and mobility (R26.89);Difficulty in walking, not elsewhere classified (R26.2) ?  ? ? ?Time: 6237-6283 ?PT Time Calculation (min) (ACUTE ONLY): 27 min ? ?Charges:  $Gait Training: 8-22 mins ?$Therapeutic Exercise: 8-22 mins ?          ?          ? ?Felecia Shelling  PTA ?Acute  Rehabilitation Services ?Pager      [phone] ?Office      [phone] ? ? ?

## 2021-11-14 NOTE — Progress Notes (Signed)
Patient discharged to home w/ family. Given all belongings, instructions, equipment. Verbalized understanding of instructions. Escorted to pov via w/c. 

## 2021-11-14 NOTE — Progress Notes (Signed)
PATIENT ID: [name]  MRN: [id number]  DOB/AGE:  [birth year] / 61 y.o.  ?3 Days Post-Op Procedure(s) (LRB): ?LEFT TOTAL HIP ARTHROPLASTY ANTERIOR APPROACH (Left) ? ?  PROGRESS NOTE ?Subjective: ?Patient is alert, oriented, no Nausea, no Vomiting, yes passing gas, . Taking PO well. ?Denies SOB, Chest or Calf Pain. Using Incentive Spirometer, PAS in place. ?Ambulate 15' ?Patient reports pain as  2/10  .   ? ?Objective: ?Vital signs in last 24 hours: ?Vitals:  ? 11/13/21 0818 11/13/21 1324 11/13/21 2109 11/14/21 0551  ?BP:  128/64 (!) 122/56 (!) 109/54  ?Pulse:  (!) 105 100 99  ?Resp:  18 18 18   ?Temp:  98 ?F (36.7 ?C) 98.2 ?F (36.8 ?C) 98.9 ?F (37.2 ?C)  ?TempSrc:  Oral Oral   ?SpO2: 97% 94% 96% 98%  ?Weight:      ?Height:      ?  ?  ?Intake/Output from previous day: ?I/O last 3 completed shifts: ?In: 300 [P.O.:300] ?Out: 4150 [Urine:4150] ?  ?Intake/Output this shift: ?No intake/output data recorded.  ? ?LABORATORY DATA: ?Recent Labs  ?  11/12/21 ?[id number] 11/13/21 ?0346 11/14/21 ?0340  ?WBC 23.9* 18.0* 12.5*  ?HGB 10.6* 9.1* 8.1*  ?HCT 34.9* 28.3* 24.4*  ?PLT 322 258 252  ?NA 132*  --  136  ?K 5.7*  --  4.1  ?CL 99  --  100  ?CO2 23  --  30  ?BUN 15  --  14  ?CREATININE 1.85*  --  0.65  ?GLUCOSE 196*  --  154*  ?CALCIUM 8.6*  --  8.9  ? ? ?Examination: ?Neurologically intact ?ABD soft ?Neurovascular intact ?Sensation intact distally ?Intact pulses distally ?Dorsiflexion/Plantar flexion intact ?Incision: dressing C/D/I ?No cellulitis present ?Compartment soft} ?XR AP&Lat of hip shows well placed\fixed THA ? ?Assessment:   ?3 Days Post-Op Procedure(s) (LRB): ?LEFT TOTAL HIP ARTHROPLASTY ANTERIOR APPROACH (Left) ?ADDITIONAL DIAGNOSIS:  Expected Acute Blood Loss Anemia, COPD, Depression,hx ETOH & Tobacco abuse ?Anticipated LOS equal to or greater than 2 midnights due to ?- Age 62 and older with one or more of the following: ? - Obesity ? - Expected need for hospital services (PT, OT, Nursing) required for safe   discharge ? - Anticipated need for postoperative skilled nursing care or inpatient rehab ? - Slow PT ? ?Plan: ?PT/OT WBAT, THA ? ?DVT Prophylaxis: SCDx72 hrs, ASA 81 mg BID x 2 weeks ? ?DISCHARGE PLAN: Home, todat if passes PT. If not will order SNF, emphasized importance of sitting up and getting up and walking to help prevent pneumonia blood clots and bedsores.  Also have discontinued all class II narcotics she is being maintained now on tramadol ? ?DISCHARGE NEEDS: HHPT, Walker, and 3-in-1 comode seat Patient ID: [name], female   DOB: 1961/04/19, 61 y.o.   MRN: [id number] ? ?

## 2021-11-14 NOTE — Discharge Summary (Addendum)
Patient ID: ?[name] ?MRN: [id number] ?DOB/AGE: Sep 30, 1960 61 y.o. ? ?Admit date: 11/11/2021 ?Discharge date: 11/14/2021 ? ?Admission Diagnoses:  ?Principal Problem: ?  Osteoarthritis of left hip ?Active Problems: ?  Status post total hip replacement, left ?  Status post hip replacement, left ? ? ?Discharge Diagnoses:  ?Same ? ?Past Medical History:  ?Diagnosis Date  ? Anxiety   ? Arthritis   ? COPD (chronic obstructive pulmonary disease) (HCC)   ? uses oxygen 4L at nite  ? Depression   ? Dyspnea   ? GERD (gastroesophageal reflux disease)   ? Hypertension   ? Peripheral edema   ? 2+ at PAT visit  ? ? ?Surgeries: Procedure(s): ?LEFT TOTAL HIP ARTHROPLASTY ANTERIOR APPROACH on 11/11/2021 ?  ?Consultants:  ? ?Discharged Condition: Improved ? ?Hospital Course: [name] is an 61 y.o. female who was admitted 11/11/2021 for operative treatment ofOsteoarthritis of left hip. Patient has severe unremitting pain that affects sleep, daily activities, and work/hobbies. After pre-op clearance the patient was taken to the operating room on 11/11/2021 and underwent  Procedure(s): ?LEFT TOTAL HIP ARTHROPLASTY ANTERIOR APPROACH.   ? ?Patient was given perioperative antibiotics:  ?Anti-infectives (From admission, onward)  ? ? Start     Dose/Rate Route Frequency Ordered Stop  ? 11/11/21 0600  vancomycin (VANCOCIN) IVPB 1000 mg/200 mL premix       ? 1,000 mg ?200 mL/hr over 60 Minutes Intravenous On call to O.R. 11/11/21 0532 11/11/21 0926  ? ?  ?  ? ?Patient was given sequential compression devices, early ambulation, and chemoprophylaxis to prevent DVT. ? ?Patient benefited maximally from hospital stay and there were no complications.   ? ?Recent vital signs: Patient Vitals for the past 24 hrs: ? BP Temp Temp src Pulse Resp SpO2  ?11/14/21 0551 (!) 109/54 98.9 ?F (37.2 ?C) -- 99 18 98 %  ?11/13/21 2109 (!) 122/56 98.2 ?F (36.8 ?C) Oral 100 18 96 %  ?11/13/21 1324 128/64 98 ?F (36.7 ?C) Oral (!) 105 18 94 %  ?11/13/21 0818 -- -- -- --  -- 97 %  ?  ? ?Recent laboratory studies:  ?Recent Labs  ?  11/12/21 ?2482 11/13/21 ?0346 11/14/21 ?0340  ?WBC 23.9* 18.0* 12.5*  ?HGB 10.6* 9.1* 8.1*  ?HCT 34.9* 28.3* 24.4*  ?PLT 322 258 252  ?NA 132*  --  136  ?K 5.7*  --  4.1  ?CL 99  --  100  ?CO2 23  --  30  ?BUN 15  --  14  ?CREATININE 1.85*  --  0.65  ?GLUCOSE 196*  --  154*  ?CALCIUM 8.6*  --  8.9  ? ? ? ?Discharge Medications:   ?Allergies as of 11/14/2021   ? ?   Reactions  ? Amoxicillin   ? Yeast infection   ? ?  ? ?  ?Medication List  ?  ? ?TAKE these medications   ? ?amLODipine 10 MG tablet ?Commonly known as: NORVASC ?TAKE 1 TABLET(10 MG) BY MOUTH DAILY ?  ?aspirin EC 81 MG tablet ?Take 1 tablet (81 mg total) by mouth 2 (two) times daily. ?  ?atorvastatin 40 MG tablet ?Commonly known as: LIPITOR ?Take 1 tablet (40 mg total) by mouth daily. ?  ?benzonatate 100 MG capsule ?Commonly known as: TESSALON ?Take 1 capsule (100 mg total) by mouth 2 (two) times daily as needed for cough. ?  ?Breo Ellipta 100-25 MCG/ACT Aepb ?Generic drug: fluticasone furoate-vilanterol ?Inhale 1 puff into the lungs daily. ?  ?budesonide-formoterol 160-4.5  MCG/ACT inhaler ?Commonly known as: SYMBICORT ?INHALE 2 PUFFS INTO THE LUNGS TWICE DAILY ?  ?busPIRone 10 MG tablet ?Commonly known as: BUSPAR ?Take 1 tablet (10 mg total) by mouth 3 (three) times daily. ?  ?doxycycline 100 MG capsule ?Commonly known as: VIBRAMYCIN ?Take 100 mg by mouth 2 (two) times daily. 7 days ?What changed: Another medication with the same name was removed. Continue taking this medication, and follow the directions you see here. ?  ?folic acid 1 MG tablet ?Commonly known as: FOLVITE ?TAKE 1 TABLET(1 MG) BY MOUTH DAILY ?  ?gabapentin 300 MG capsule ?Commonly known as: NEURONTIN ?TAKE 1 CAPSULE(300 MG) BY MOUTH THREE TIMES DAILY ?  ?hydrOXYzine 50 MG tablet ?Commonly known as: ATARAX ?TAKE 1 TABLET (50 MG TOTAL) BY MOUTH 3 (THREE) TIMES DAILY AS NEEDED FOR ANXIETY. ?  ?ibuprofen 200 MG tablet ?Commonly known  as: ADVIL ?Take 600 mg by mouth every 6 (six) hours as needed for moderate pain. ?  ?Icy Hot 5 % Ptch ?Generic drug: Menthol ?Apply 1 patch topically daily as needed (hip pain). ?  ?losartan 100 MG tablet ?Commonly known as: COZAAR ?Take 1 tablet (100 mg total) by mouth daily. ?What changed: Another medication with the same name was removed. Continue taking this medication, and follow the directions you see here. ?  ?multivitamin with minerals Tabs tablet ?Take 1 tablet by mouth daily. ?  ?oxybutynin 15 MG 24 hr tablet ?Commonly known as: DITROPAN XL ?TAKE 1 TABLET(15 MG) BY MOUTH AT BEDTIME ?  ?OXYGEN ?Inhale 4 L into the lungs at bedtime. ?  ?pantoprazole 40 MG tablet ?Commonly known as: PROTONIX ?Take 1 tablet (40 mg total) by mouth 2 (two) times daily. ?  ?POTASSIUM PO ?Take 1 tablet by mouth daily. ?  ?QUEtiapine 300 MG tablet ?Commonly known as: SEROQUEL ?TAKE 1 TABLET(300 MG) BY MOUTH AT BEDTIME ?  ?sertraline 100 MG tablet ?Commonly known as: ZOLOFT ?Take 2 tablets (200 mg total) by mouth daily. ?  ?tizanidine 2 MG capsule ?Commonly known as: Zanaflex ?Take 2 capsules (4 mg total) by mouth 3 (three) times daily as needed for muscle spasms. ?  ?traMADol 50 MG tablet ?Commonly known as: ULTRAM ?Take 50 mg by mouth every 6 (six) hours as needed for severe pain. ?  ?VITAMIN D PO ?Take 1 capsule by mouth daily. ?  ? ?  ? ?  ?  ? ? ?  ?Durable Medical Equipment  ?(From admission, onward)  ?  ? ? ?  ? ?  Start     Ordered  ? 11/11/21 1052  DME Walker rolling  Once       ?Question:  Patient needs a walker to treat with the following condition  Answer:  Status post total hip replacement, left  ? 11/11/21 1051  ? 11/11/21 1052  DME 3 n 1  Once       ? 11/11/21 1051  ? ?  ?  ? ?  ? ? ?Diagnostic Studies: DG Chest 2 View ? ?Result Date: 11/01/2021 ?CLINICAL DATA:  Chest congestion EXAM: CHEST - 2 VIEW COMPARISON:  12/31/2020 FINDINGS: Mild left basilar atelectasis or scarring. Lungs are otherwise clear. No pneumothorax  or pleural effusion. Cardiac size within normal limits. No acute bone abnormality. IMPRESSION: No radiographic evidence of acute cardiopulmonary disease. Electronically Signed   By: Fidela Salisbury M.D.   On: 11/01/2021 23:45  ? ?DG C-Arm 1-60 Min-No Report ? ?Result Date: 11/11/2021 ?Fluoroscopy was utilized by the requesting physician.  No radiographic interpretation.  ? ?DG C-Arm 1-60 Min-No Report ? ?Result Date: 11/11/2021 ?Fluoroscopy was utilized by the requesting physician.  No radiographic interpretation.  ? ?DG HIP PORT UNILAT WITH PELVIS 1V LEFT ? ?Result Date: 11/11/2021 ?CLINICAL DATA:  Left hip osteoarthritis, left hip replacement EXAM: DG HIP (WITH OR WITHOUT PELVIS) 1V PORT LEFT COMPARISON:  10/09/2021 MRI FINDINGS: Spot fluoroscopic views demonstrate left total hip arthroplasty changes. Components appear aligned in the frontal plane. No complicating features or hardware abnormality. Remote right hip arthroplasty also noted. IMPRESSION: Expected appearance status post left hip arthroplasty. Electronically Signed   By: Jerilynn Mages.  Shick M.D.   On: 11/11/2021 09:05   ? ?Disposition: Discharge disposition: 01-Home or Self Care ? ? ? ? ? ? ?Discharge Instructions   ? ? Call MD / Call 911   Complete by: As directed ?  ? If you experience chest pain or shortness of breath, CALL 911 and be transported to the hospital emergency room.  If you develope a fever above 101 F, pus (white drainage) or increased drainage or redness at the wound, or calf pain, call your surgeon's office.  ? Constipation Prevention   Complete by: As directed ?  ? Drink plenty of fluids.  Prune juice may be helpful.  You may use a stool softener, such as Colace (over the counter) 100 mg twice a day.  Use MiraLax (over the counter) for constipation as needed.  ? Diet - low sodium heart healthy   Complete by: As directed ?  ? Increase activity slowly as tolerated   Complete by: As directed ?  ? Post-operative opioid taper instructions:   Complete  by: As directed ?  ? POST-OPERATIVE OPIOID TAPER INSTRUCTIONS: ?It is important to wean off of your opioid medication as soon as possible. If you do not need pain medication after your surgery it is ok

## 2021-11-14 NOTE — Discharge Instructions (Signed)

## 2021-11-14 NOTE — Progress Notes (Signed)
Physical Therapy Treatment ?Patient Details ?Name: [name] ?MRN: [id number] ?DOB: Jun 02, 1961 ?Today's Date: 11/14/2021 ? ? ?History of Present Illness 61 yo female s/p L DA THA on 11/11/21. PMH: COPD, ETOH use, depression, anxiety, R DA THA, L shoulder surgery, bil CTR ? ?  ?PT Comments  ? ? POD # 3 am session ?General Comments: pt appears AxO x 3 but during session required repeat VC's related to safety as well as instructions(new).  easily distracted.  "I will do better once I'm home". Assisted OOB to amb to bathroom was difficult.  General bed mobility comments: attempted to educate pt on use of belt to self assist L LE OOB.  Required increased time and repeat instrcutions.  Nearly 7 min to complete. General transfer comment: 75% VC's on proper hand placement rising from bed.  Also assisted with a toilet transfer in which pt had a near fall when she went to sit to early prior to completing her turn and allowing herself to "plop" on toilet still holding walker.Pt also required assist for peri care as she was unable to safely stand/balance self and perform.  General Gait Details: assisted with amb to bathroom required increased time.  Limited activity tolerance.  2/4 dyspnea with RA avg 91%.  Pt purse lip breathing.  Upon returning from bathroom pt c/o dizziness.  BP 100/84 RA 89%.  Allowed a seated rest break, then assisted with amb another short distance in hallway.  Pt also present with unsteady, shaky gait.  Pt's responce was, "I take alot of medication". ?Pt has NOT met her mobility goals to safely D/C to home.  Will see pt again later today.  Pt asking for perwick to be placed back in.  Educated she needed to "move more" by calling staff and they will assist her with walking to bathroom.    ?Recommendations for follow up therapy are one component of a multi-disciplinary discharge planning process, led by the attending physician.  Recommendations may be updated based on patient status, additional functional  criteria and insurance authorization. ? ?Follow Up Recommendations ? Follow physician's recommendations for discharge plan and follow up therapies ?  ?  ?Assistance Recommended at Discharge Intermittent Supervision/Assistance  ?Patient can return home with the following Help with stairs or ramp for entrance;Assist for transportation;Assistance with cooking/housework;A little help with bathing/dressing/bathroom ?  ?Equipment Recommendations ? Rolling walker (2 wheels)  ?  ?Recommendations for Other Services   ? ? ?  ?Precautions / Restrictions Precautions ?Precautions: Fall ?Restrictions ?Weight Bearing Restrictions: No ?Other Position/Activity Restrictions: WBAT  ?  ? ?Mobility ? Bed Mobility ?Overal bed mobility: Needs Assistance ?Bed Mobility: Supine to Sit ?  ?  ?  ?  ?  ?General bed mobility comments: attempted to educate pt on use of belt to self assist L LE OOB.  Required increased time and repeat instrcutions.  Nearly 7 min to complete. ?  ? ?Transfers ?Overall transfer level: Needs assistance ?Equipment used: Rolling walker (2 wheels) ?Transfers: Sit to/from Stand ?Sit to Stand: Min guard, Min assist ?  ?  ?  ?  ?  ?General transfer comment: 75% VC's on proper hand placement rising from bed.  Also assisted with a toilet transfer in which pt had a near fall when she went to sit to early prior to completing her turn and allowing herself to "plop" on toilet still holding walker. ?  ? ?Ambulation/Gait ?Ambulation/Gait assistance: Min assist ?Gait Distance (Feet): 23 Feet ?Assistive device: Rolling walker (2 wheels) ?Gait Pattern/deviations:  Step-to pattern, Wide base of support ?Gait velocity: decreased ?  ?  ?General Gait Details: assisted with amb to bathroom required increased time.  Limited activity tolerance.  2/4 dyspnea with RA avg 91%.  Pt purse lip breathing.  Upon returning from bathroom pt c/o dizziness.  BP 100/84 RA 89%.  Allowed a seated rest break, then assisted with amb another short distance in  hallway.  Pt also present with unsteady, shaky gait.  Pt's responce was, "I take alot of medication". ? ? ?Stairs ?  ?  ?  ?  ?  ? ? ?Wheelchair Mobility ?  ? ?Modified Rankin (Stroke Patients Only) ?  ? ? ?  ?Balance   ?  ?  ?  ?  ?  ?  ?  ?  ?  ?  ?  ?  ?  ?  ?  ?  ?  ?  ?  ? ?  ?Cognition Arousal/Alertness: Awake/alert ?Behavior During Therapy: Restless ?Overall Cognitive Status: No family/caregiver present to determine baseline cognitive functioning ?  ?  ?  ?  ?  ?  ?  ?  ?  ?  ?  ?  ?  ?  ?  ?  ?General Comments: pt appears AxO x 3 but during session required repeat VC's related to safety as well as instructions(new).  easily distracted.  "I will do better once I'm home". ?  ?  ? ?  ?Exercises   ? ?  ?General Comments   ?  ?  ? ?Pertinent Vitals/Pain Pain Assessment ?Pain Assessment: 0-10 ?Pain Score: 8  ?Pain Location: L hip ?Pain Descriptors / Indicators: Aching, Burning, Constant ?Pain Intervention(s): Monitored during session, Premedicated before session, Repositioned, Ice applied  ? ? ?Home Living   ?  ?  ?  ?  ?  ?  ?  ?  ?  ?   ?  ?Prior Function    ?  ?  ?   ? ?PT Goals (current goals can now be found in the care plan section) Progress towards PT goals: Progressing toward goals ? ?  ?Frequency ? ? ? 7X/week ? ? ? ?  ?PT Plan Current plan remains appropriate  ? ? ?Co-evaluation   ?  ?  ?  ?  ? ?  ?AM-PAC PT "6 Clicks" Mobility   ?Outcome Measure ? Help needed turning from your back to your side while in a flat bed without using bedrails?: A Little ?Help needed moving from lying on your back to sitting on the side of a flat bed without using bedrails?: A Little ?Help needed moving to and from a bed to a chair (including a wheelchair)?: A Little ?Help needed standing up from a chair using your arms (e.g., wheelchair or bedside chair)?: A Little ?Help needed to walk in hospital room?: A Little ?Help needed climbing 3-5 steps with a railing? : A Lot ?6 Click Score: 17 ? ?  ?End of Session Equipment Utilized  During Treatment: Gait belt ?Activity Tolerance: Patient limited by fatigue;Other (comment) (dizziness) ?Patient left: in chair;with call bell/phone within reach ?Nurse Communication: Mobility status ?PT Visit Diagnosis: Other abnormalities of gait and mobility (R26.89);Difficulty in walking, not elsewhere classified (R26.2) ?  ? ? ?Time: 5462-7035 ?PT Time Calculation (min) (ACUTE ONLY): 30 min ? ?Charges:  $Gait Training: 8-22 mins ?$Therapeutic Activity: 8-22 mins          ?          ?Rica Koyanagi  PTA ?Acute  Rehabilitation Services ?Pager      [phone] ?Office      [phone] ? ?

## 2021-11-14 NOTE — Progress Notes (Signed)
PHYSICAL THERAPY ? ?OOB to amb to bathroom.  Pt unsteady present with Dyskinesia.  Pt stated, "I take a lot of meds".   Pt also requiring repeat VC's and near fall trying to sit onto toilet before completing her turn.  Assisted with standing at sink to wash hands.  Coming out of the bathroom, pt c/o increased dizziness.  "Swimmy".  BP taken sitting EOB 110/61, RA 92% and HR 106.  Assisted with amb 20 feet in hallway with RN, pt c/o increased dizziness.  BP 100/84, RA 89 and HR 122.  "I got to sit".   ?Noted Hgb 8.1  ?Pt will need another PT session as she did not meet goals. ? ?Felecia Shelling  PTA ?Acute  Rehabilitation Services ?Pager      [phone] ?Office      [phone] ? ?

## 2021-11-15 ENCOUNTER — Telehealth: Payer: Self-pay

## 2021-11-15 NOTE — Telephone Encounter (Signed)
Transition Care Management Unsuccessful Follow-up Telephone Call ? ?Date of discharge and from where:  [facility] on 11/14/2021 ? ?Attempts:  1st Attempt ? ?Reason for unsuccessful TCM follow-up call:  Left voice message unable to reach pt at this time. Call back requested  ? ?  ?

## 2021-11-17 ENCOUNTER — Emergency Department (HOSPITAL_COMMUNITY): Payer: Medicaid Other

## 2021-11-17 ENCOUNTER — Inpatient Hospital Stay (HOSPITAL_COMMUNITY)
Admission: EM | Admit: 2021-11-17 | Discharge: 2021-11-20 | DRG: 871 | Disposition: A | Discharge status: Home / Self Care | Payer: Medicaid Other | Attending: Internal Medicine | Admitting: Internal Medicine

## 2021-11-17 DIAGNOSIS — Z79899 Other long term (current) drug therapy: Secondary | ICD-10-CM

## 2021-11-17 DIAGNOSIS — D62 Acute posthemorrhagic anemia: Secondary | ICD-10-CM | POA: Diagnosis present

## 2021-11-17 DIAGNOSIS — A419 Sepsis, unspecified organism: Principal | ICD-10-CM | POA: Diagnosis present

## 2021-11-17 DIAGNOSIS — J189 Pneumonia, unspecified organism: Secondary | ICD-10-CM | POA: Diagnosis not present

## 2021-11-17 DIAGNOSIS — Z880 Allergy status to penicillin: Secondary | ICD-10-CM | POA: Diagnosis not present

## 2021-11-17 DIAGNOSIS — F1021 Alcohol dependence, in remission: Secondary | ICD-10-CM | POA: Diagnosis present

## 2021-11-17 DIAGNOSIS — I10 Essential (primary) hypertension: Secondary | ICD-10-CM | POA: Diagnosis present

## 2021-11-17 DIAGNOSIS — K219 Gastro-esophageal reflux disease without esophagitis: Secondary | ICD-10-CM | POA: Diagnosis present

## 2021-11-17 DIAGNOSIS — F331 Major depressive disorder, recurrent, moderate: Secondary | ICD-10-CM | POA: Diagnosis present

## 2021-11-17 DIAGNOSIS — F17210 Nicotine dependence, cigarettes, uncomplicated: Secondary | ICD-10-CM | POA: Diagnosis present

## 2021-11-17 DIAGNOSIS — E119 Type 2 diabetes mellitus without complications: Secondary | ICD-10-CM

## 2021-11-17 DIAGNOSIS — Z8249 Family history of ischemic heart disease and other diseases of the circulatory system: Secondary | ICD-10-CM

## 2021-11-17 DIAGNOSIS — J9610 Chronic respiratory failure, unspecified whether with hypoxia or hypercapnia: Secondary | ICD-10-CM | POA: Diagnosis present

## 2021-11-17 DIAGNOSIS — Z9981 Dependence on supplemental oxygen: Secondary | ICD-10-CM | POA: Diagnosis not present

## 2021-11-17 DIAGNOSIS — I959 Hypotension, unspecified: Secondary | ICD-10-CM | POA: Diagnosis not present

## 2021-11-17 DIAGNOSIS — Z7982 Long term (current) use of aspirin: Secondary | ICD-10-CM

## 2021-11-17 DIAGNOSIS — Z7951 Long term (current) use of inhaled steroids: Secondary | ICD-10-CM | POA: Diagnosis not present

## 2021-11-17 DIAGNOSIS — F411 Generalized anxiety disorder: Secondary | ICD-10-CM | POA: Diagnosis present

## 2021-11-17 DIAGNOSIS — Z96643 Presence of artificial hip joint, bilateral: Secondary | ICD-10-CM | POA: Diagnosis present

## 2021-11-17 DIAGNOSIS — Z96642 Presence of left artificial hip joint: Secondary | ICD-10-CM

## 2021-11-17 DIAGNOSIS — K7460 Unspecified cirrhosis of liver: Secondary | ICD-10-CM | POA: Diagnosis present

## 2021-11-17 DIAGNOSIS — R6521 Severe sepsis with septic shock: Secondary | ICD-10-CM | POA: Diagnosis present

## 2021-11-17 DIAGNOSIS — M1612 Unilateral primary osteoarthritis, left hip: Secondary | ICD-10-CM | POA: Diagnosis present

## 2021-11-17 DIAGNOSIS — I700 Atherosclerosis of aorta: Secondary | ICD-10-CM | POA: Diagnosis present

## 2021-11-17 DIAGNOSIS — L03116 Cellulitis of left lower limb: Secondary | ICD-10-CM | POA: Diagnosis present

## 2021-11-17 DIAGNOSIS — Y95 Nosocomial condition: Secondary | ICD-10-CM | POA: Diagnosis present

## 2021-11-17 DIAGNOSIS — J440 Chronic obstructive pulmonary disease with acute lower respiratory infection: Secondary | ICD-10-CM | POA: Diagnosis present

## 2021-11-17 DIAGNOSIS — J180 Bronchopneumonia, unspecified organism: Secondary | ICD-10-CM | POA: Diagnosis present

## 2021-11-17 LAB — CBC WITH DIFFERENTIAL/PLATELET
Abs Immature Granulocytes: 0.43 10*3/uL — ABNORMAL HIGH (ref 0.00–0.07)
Basophils Absolute: 0.1 10*3/uL (ref 0.0–0.1)
Basophils Relative: 1 %
Eosinophils Absolute: 0.6 10*3/uL — ABNORMAL HIGH (ref 0.0–0.5)
Eosinophils Relative: 4 %
HCT: 22.8 % — ABNORMAL LOW (ref 36.0–46.0)
Hemoglobin: 7.5 g/dL — ABNORMAL LOW (ref 12.0–15.0)
Immature Granulocytes: 3 %
Lymphocytes Relative: 15 %
Lymphs Abs: 2 10*3/uL (ref 0.7–4.0)
MCH: 30.6 pg (ref 26.0–34.0)
MCHC: 32.9 g/dL (ref 30.0–36.0)
MCV: 93.1 fL (ref 80.0–100.0)
Monocytes Absolute: 1.7 10*3/uL — ABNORMAL HIGH (ref 0.1–1.0)
Monocytes Relative: 12 %
Neutro Abs: 8.9 10*3/uL — ABNORMAL HIGH (ref 1.7–7.7)
Neutrophils Relative %: 65 %
Platelets: 384 10*3/uL (ref 150–400)
RBC: 2.45 MIL/uL — ABNORMAL LOW (ref 3.87–5.11)
RDW: 13.1 % (ref 11.5–15.5)
WBC: 13.7 10*3/uL — ABNORMAL HIGH (ref 4.0–10.5)
nRBC: 0.2 % (ref 0.0–0.2)

## 2021-11-17 LAB — URINALYSIS, ROUTINE W REFLEX MICROSCOPIC
Bacteria, UA: NONE SEEN
Bilirubin Urine: NEGATIVE
Glucose, UA: NEGATIVE mg/dL
Hgb urine dipstick: NEGATIVE
Ketones, ur: NEGATIVE mg/dL
Nitrite: NEGATIVE
Protein, ur: NEGATIVE mg/dL
Specific Gravity, Urine: 1.016 (ref 1.005–1.030)
pH: 6 (ref 5.0–8.0)

## 2021-11-17 LAB — BASIC METABOLIC PANEL
Anion gap: 8 (ref 5–15)
BUN: 15 mg/dL (ref 8–23)
CO2: 25 mmol/L (ref 22–32)
Calcium: 8.1 mg/dL — ABNORMAL LOW (ref 8.9–10.3)
Chloride: 101 mmol/L (ref 98–111)
Creatinine, Ser: 0.92 mg/dL (ref 0.44–1.00)
GFR, Estimated: >60 mL/min (ref >60)
Glucose, Bld: 121 mg/dL — ABNORMAL HIGH (ref 70–99)
Potassium: 3.5 mmol/L (ref 3.5–5.1)
Sodium: 134 mmol/L — ABNORMAL LOW (ref 135–145)

## 2021-11-17 LAB — LACTIC ACID, PLASMA
Lactic Acid, Venous: 2.9 mmol/L (ref 0.5–1.9)
Lactic Acid, Venous: 2.5 mmol/L (ref 0.5–1.9)

## 2021-11-17 LAB — POC OCCULT BLOOD, ED: Fecal Occult Bld: NEGATIVE

## 2021-11-17 IMAGING — CT CT ABD-PELV W/ CM
2 of 5 series · 16 of 46 positions shown, 18 images · IV contrast (agent unspecified)
Comparison: [DATE]

CLINICAL DATA: Sepsis, hypotension

EXAM:
CT ABDOMEN AND PELVIS WITH CONTRAST
TECHNIQUE: Multidetector CT imaging of the abdomen and pelvis was performed
using the standard protocol following bolus administration of
intravenous contrast.

[Series 2: axial st · axial · 0.98mm/px · z∈[+836,+1240]mm · 13 of 93 slices shown, 15 images]
[im 6/93  soft-tissue]
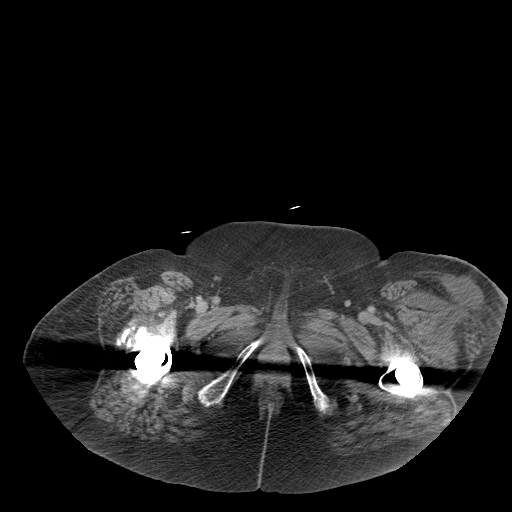
[im 6/93  bone]
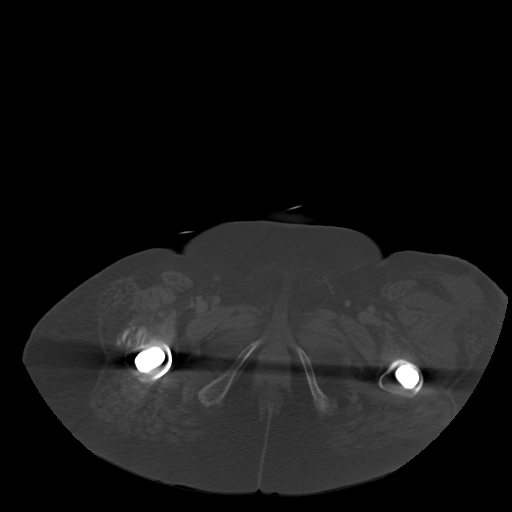
[im 11/93  soft-tissue]
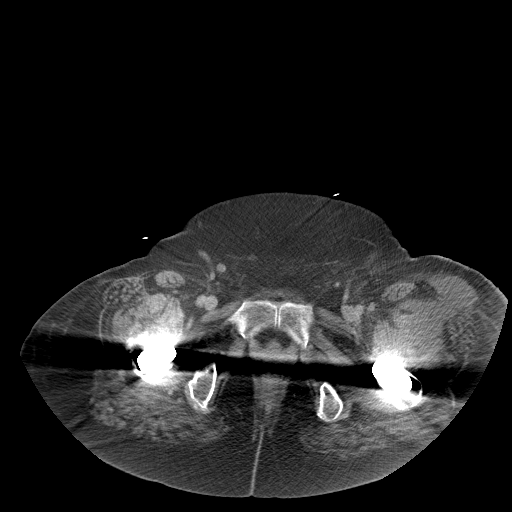
[im 22/93  soft-tissue]
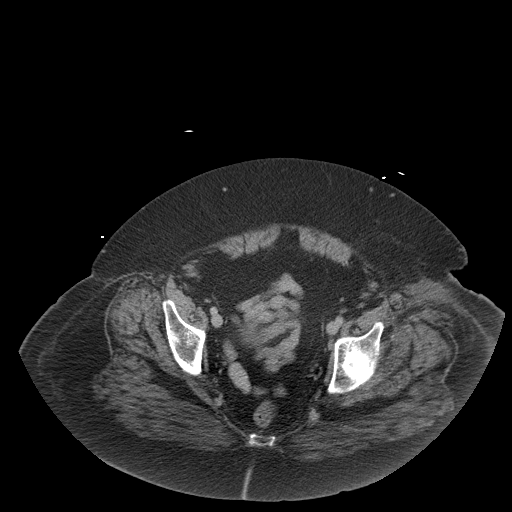
[im 28/93  soft-tissue]
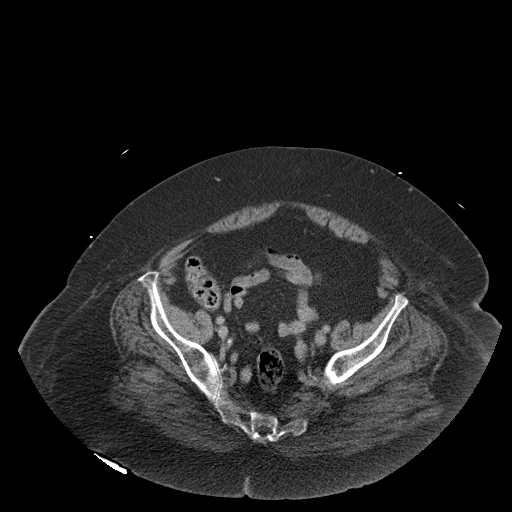
[im 33/93  soft-tissue]
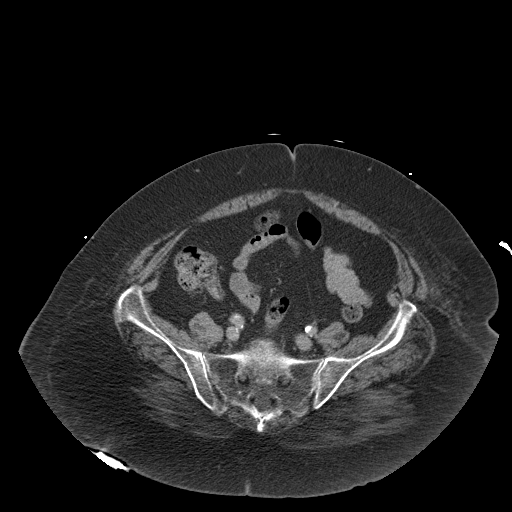
[im 38/93  soft-tissue]
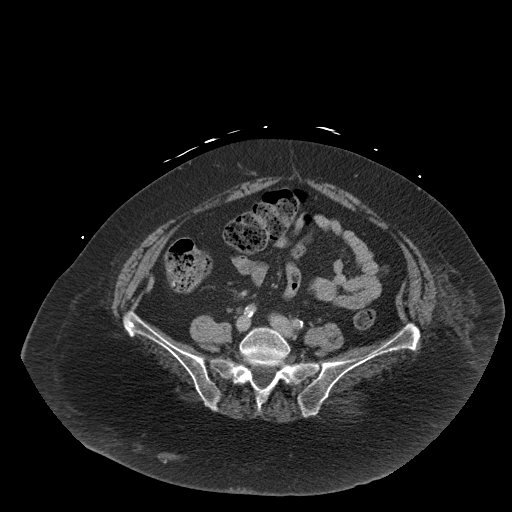
[im 49/93  soft-tissue]
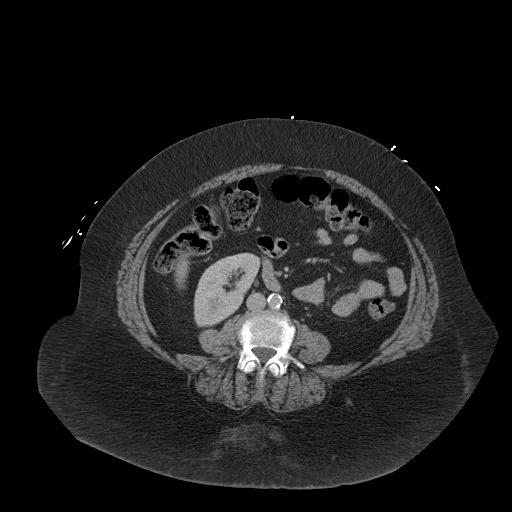
[im 55/93  soft-tissue]
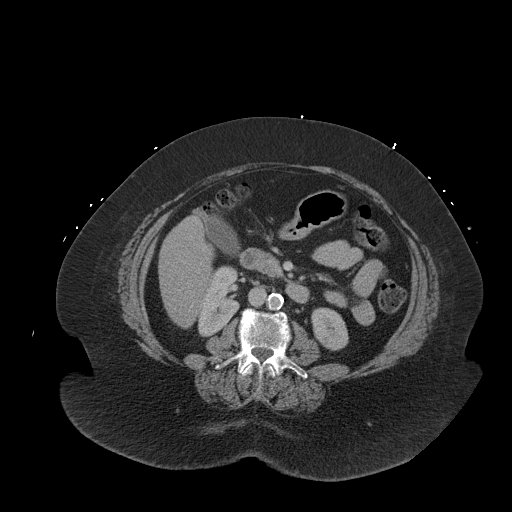
[im 60/93  soft-tissue]
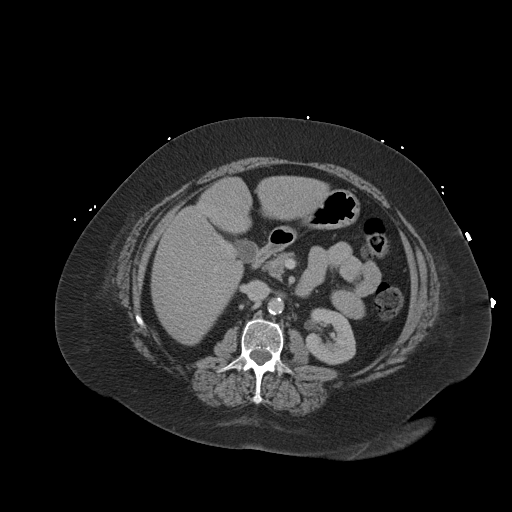
[im 60/93  bone]
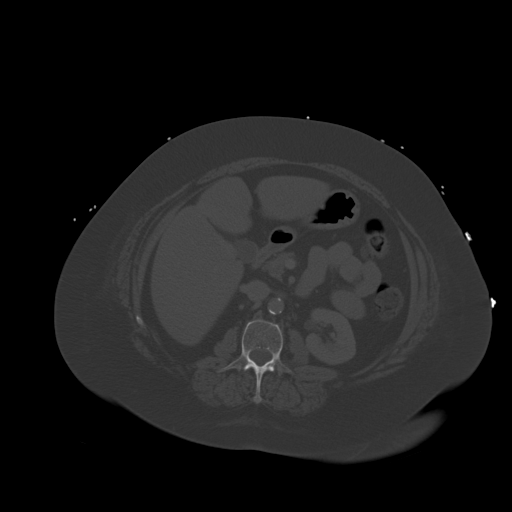
[im 65/93  soft-tissue]
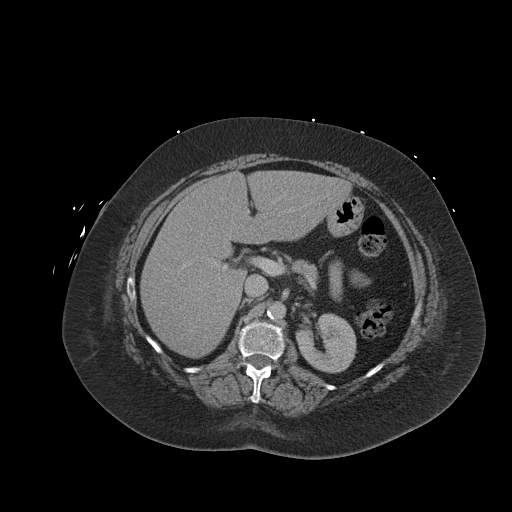
[im 71/93  soft-tissue]
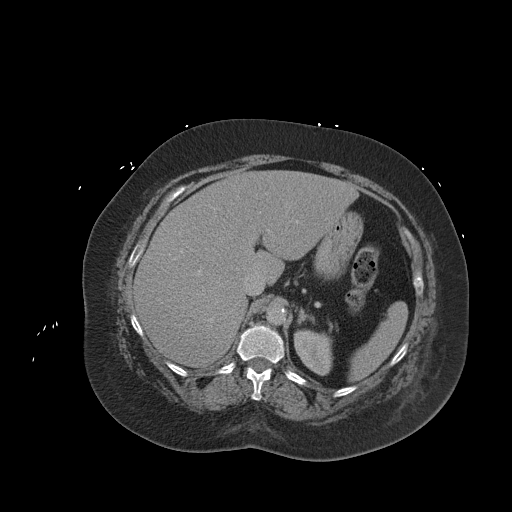
[im 82/93  soft-tissue]
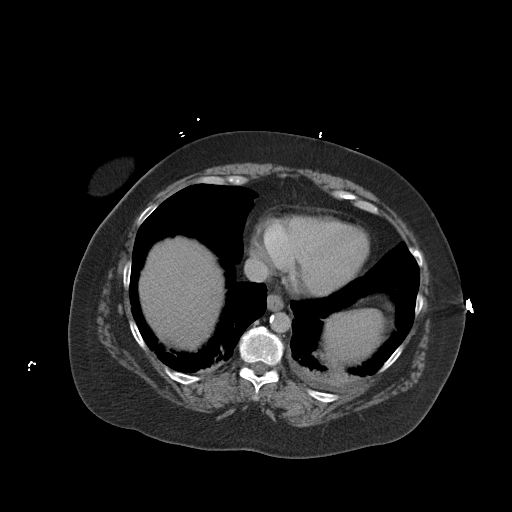
[im 87/93  soft-tissue]
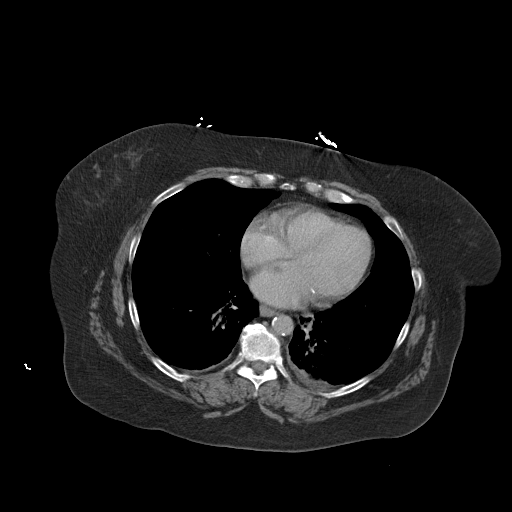

[Series 4: coronal st · coronal · 0.92mm/px · 3 of 191 slices shown]
[im 64/191  soft-tissue]
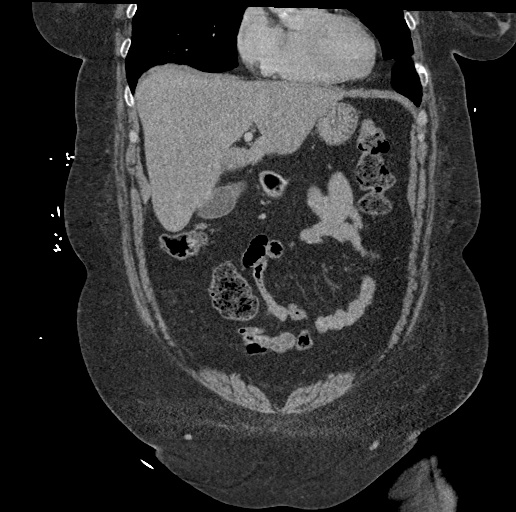
[im 85/191  soft-tissue]
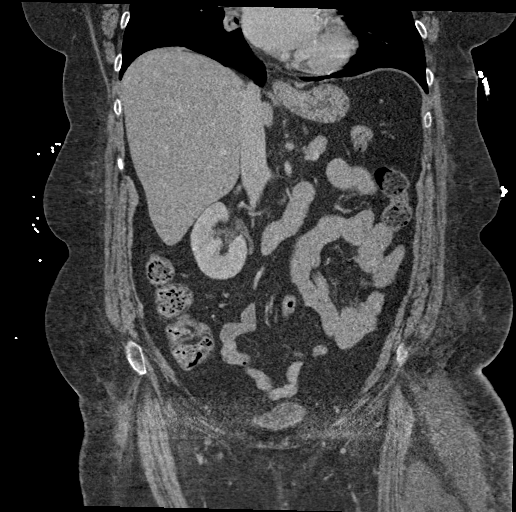
[im 106/191  soft-tissue]
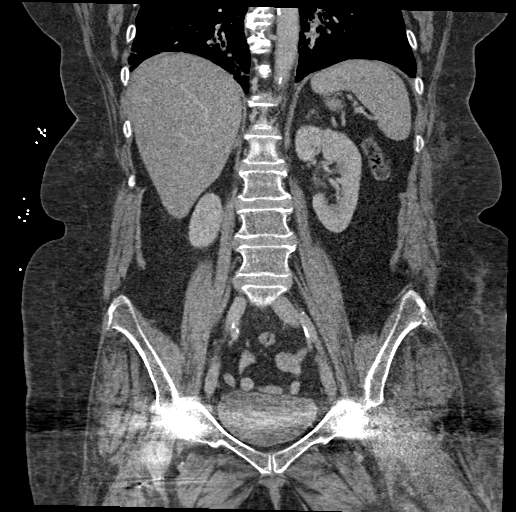

[16 of 46 positions shown; findings below may reference images not displayed]

RADIATION DOSE REDUCTION: This exam was performed according to the
departmental dose-optimization program which includes automated
exposure control, adjustment of the mA and/or kV according to
patient size and/or use of iterative reconstruction technique.

CONTRAST:  100mL OMNIPAQUE IOHEXOL 300 MG/ML  SOLN
FINDINGS: Lower chest: Trace left pleural effusion. There is patchy
consolidation in the bilateral infrahilar regions in the lower
lobes, left greater than right. Minimal left upper lobe ground-glass
opacity is also visualized. Multifocal pneumonia is suspected.

Hepatobiliary: No focal liver abnormality is seen. No gallstones,
gallbladder wall thickening, or biliary dilatation.

Pancreas: Unremarkable. No pancreatic ductal dilatation or
surrounding inflammatory changes.

Spleen: Normal in size without focal abnormality.

Adrenals/Urinary Tract: Adrenal glands are unremarkable. Kidneys are
normal, without renal calculi, focal lesion, or hydronephrosis.
Bladder is unremarkable. Evaluation of the bladder is limited by
streak artifact due to bilateral hip arthroplasties.

Stomach/Bowel: No bowel obstruction or ileus. The appendix, if still
present, is not identified. No bowel wall thickening or inflammatory
change.

Vascular/Lymphatic: Aortic atherosclerosis. No enlarged abdominal or
pelvic lymph nodes.

Reproductive: Status post hysterectomy. No adnexal masses.

Other: No free fluid or free intraperitoneal gas. No abdominal wall
hernia.

Musculoskeletal: Bilateral hip arthroplasties. No acute or
destructive bony lesions. Reconstructed images demonstrate no
additional findings.
IMPRESSION: 1. Bilateral airspace disease, greatest within the left lower lobe,
consistent with multifocal bronchopneumonia.
2. Trace left pleural effusion.
3. No acute intra-abdominal or intrapelvic process.
4.  Aortic Atherosclerosis ([JQ]-[JQ]).

## 2021-11-17 IMAGING — DX DG CHEST 1V PORT
1 series · 1 of 1 positions shown · non-contrast
Comparison: [DATE]

CLINICAL DATA: Dizziness, hypotension

EXAM:
PORTABLE CHEST 1 VIEW

[chest ap]
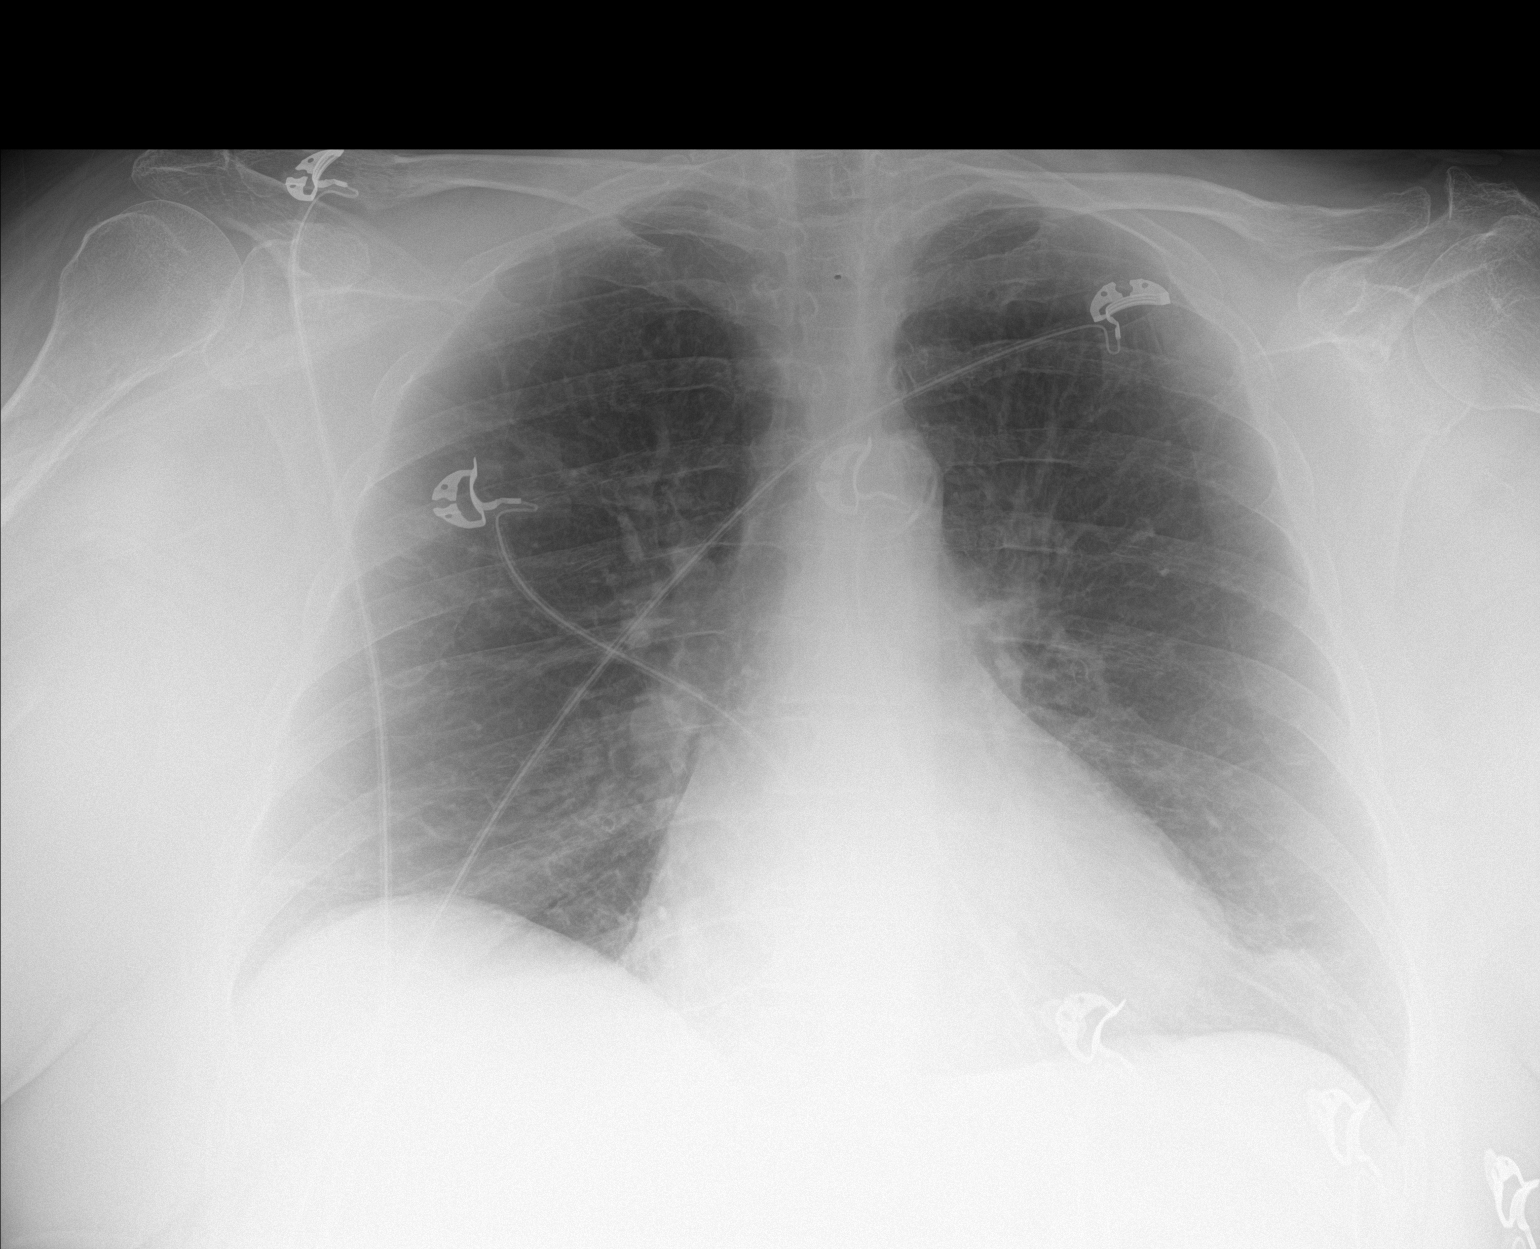

[1 of 1 positions shown; findings below may reference images not displayed]

FINDINGS: Transverse diameter of heart is in the upper limits of normal. Left
cardiophrenic angle is indistinct which has not changed, possibly
suggesting pleural thickening. There are no signs of pulmonary edema
or focal pulmonary consolidation. There is no pleural effusion or
pneumothorax.
IMPRESSION: No active disease.

## 2021-11-17 IMAGING — CR DG FEMUR 2+V*L*
4 series · 4 of 4 positions shown · non-contrast
Comparison: None.

CLINICAL DATA: Cellulitis

EXAM:
LEFT FEMUR 2 VIEWS

[x femur proximal ap left]
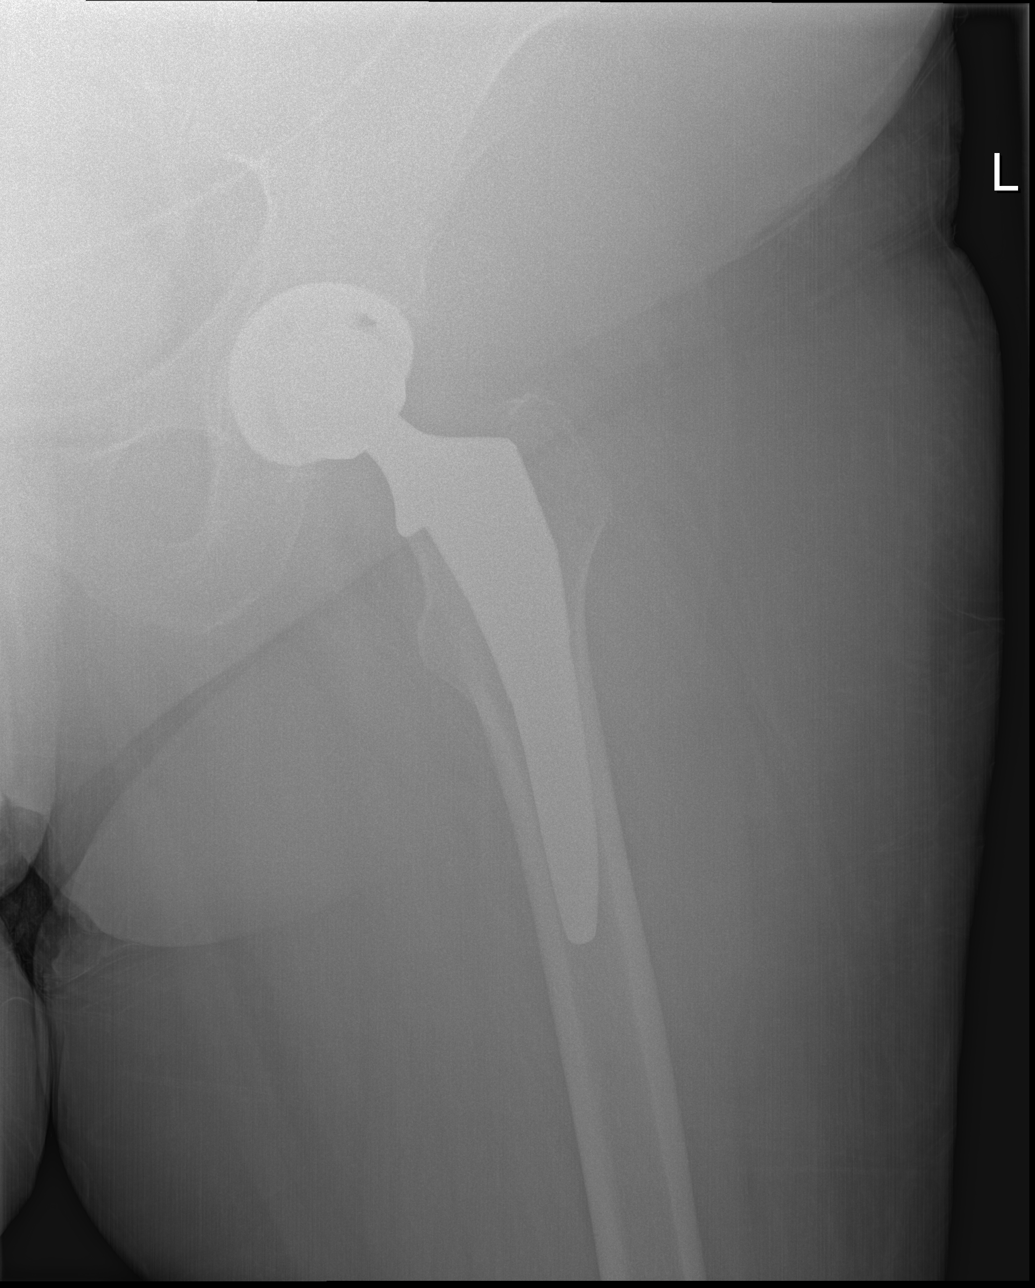

[x femur distal ap left]
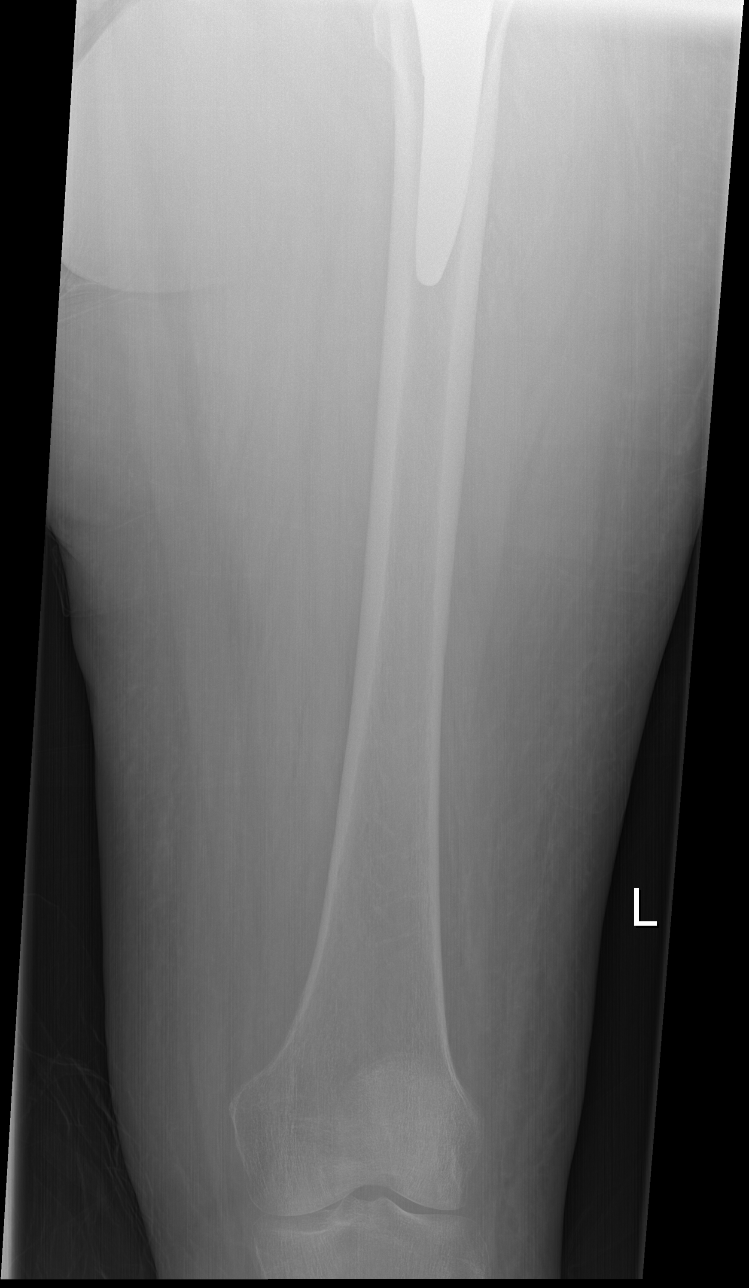

[x hip lat left]
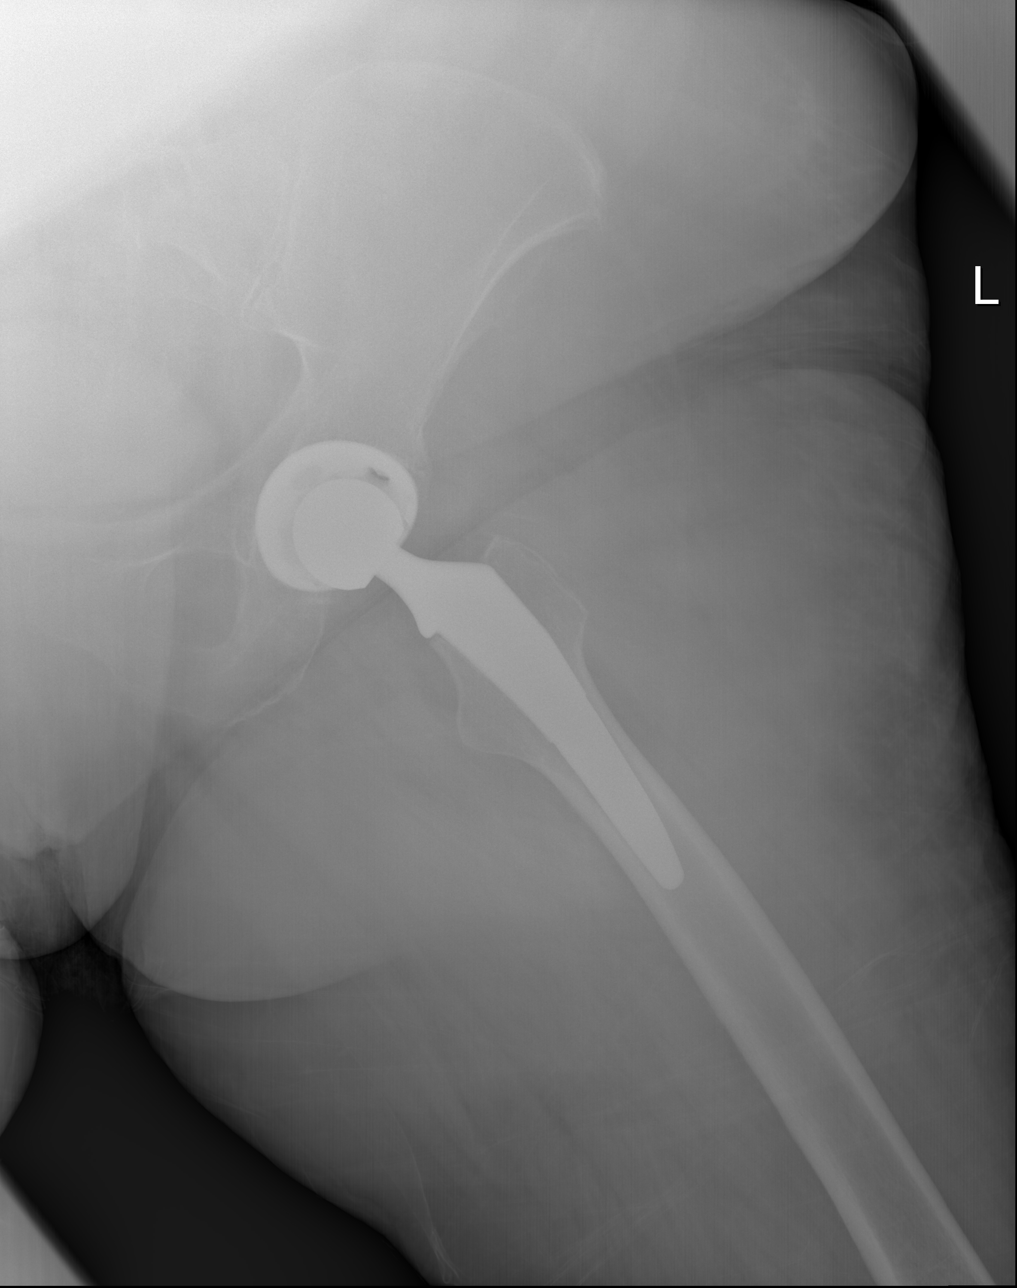

[x femur distal lat left]
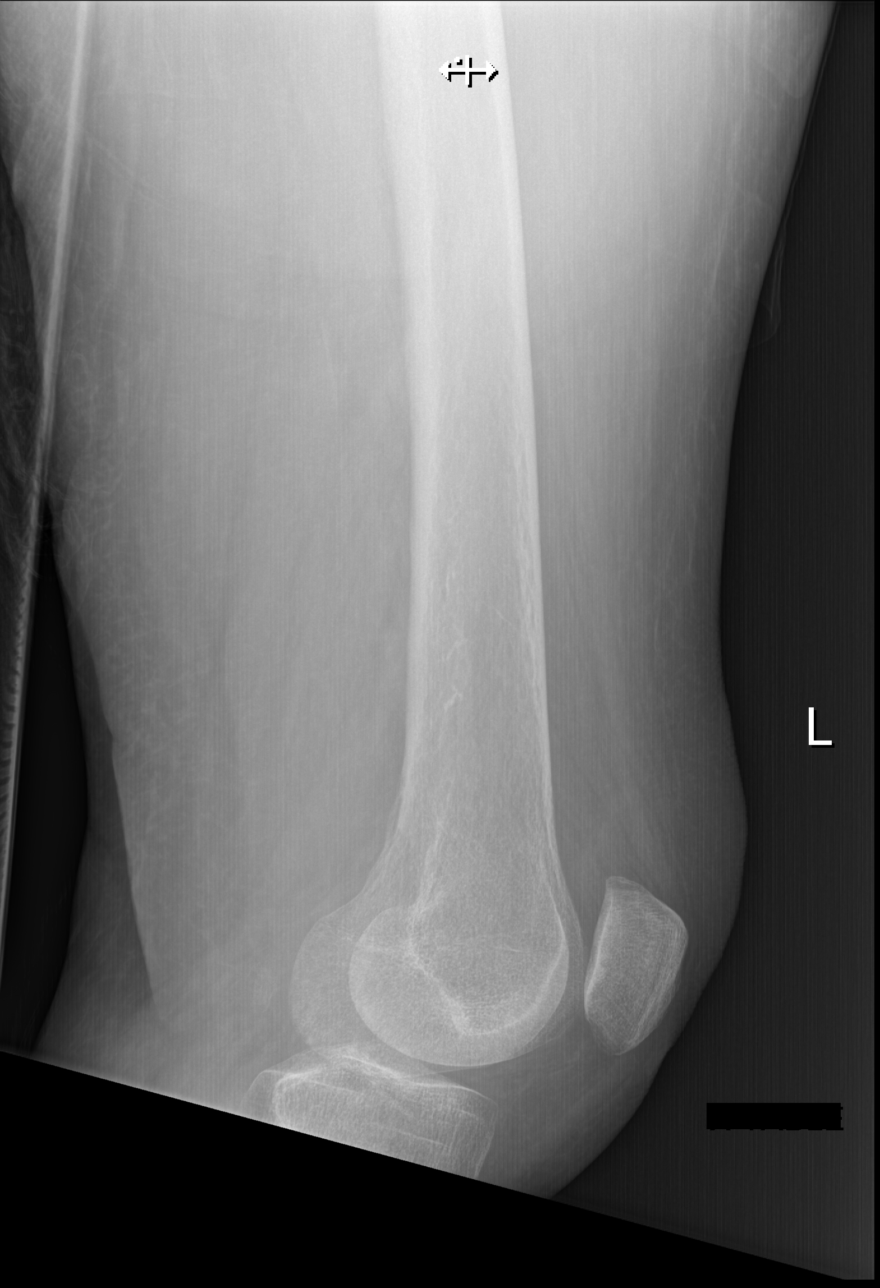

[4 of 4 positions shown; findings below may reference images not displayed]

FINDINGS: Evaluation is somewhat limited due to body habitus. There is a total
left hip prosthesis. No periprosthetic abnormal lucency identified.
No acute fracture or dislocation identified.
IMPRESSION: No acute osseous abnormality identified.

## 2021-11-17 IMAGING — CT CT FEMUR *L* W/ CM
2 of 3 series · 13 of 35 positions shown, 16 images · IV contrast (agent unspecified)
Comparison: Left femur x-rays from same day.

CLINICAL DATA: Left thigh infection. Left hip replacement 6 days
ago.

EXAM:
CT OF THE LOWER LEFT EXTREMITY WITH CONTRAST
TECHNIQUE: Multidetector CT imaging of the lower left extremity was performed
according to the standard protocol following intravenous contrast
administration.

[Series 5: axial st · axial · 0.65mm/px · z∈[+871,+1421]mm · 10 of 327 slices shown, 13 images]
[im 26/327  soft-tissue]
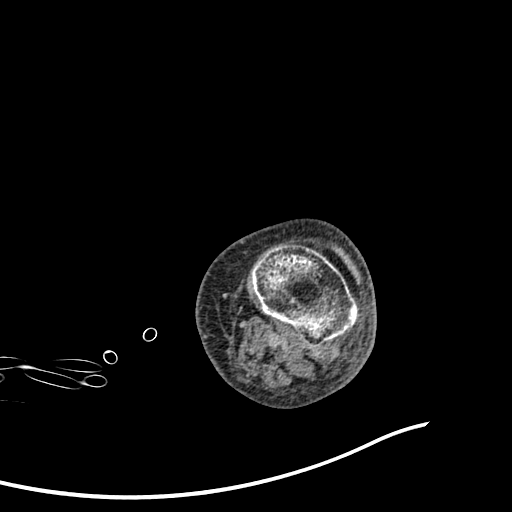
[im 26/327  bone]
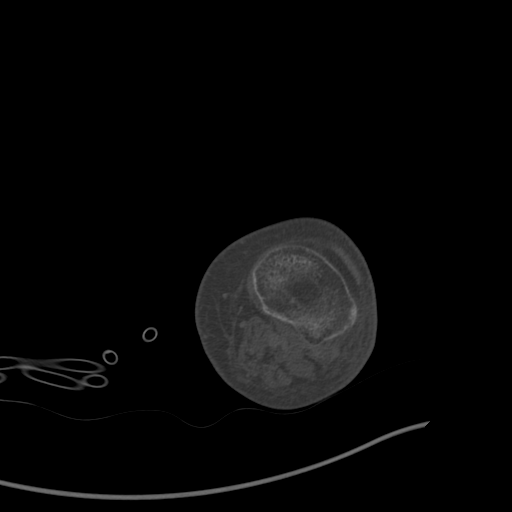
[im 51/327  bone]
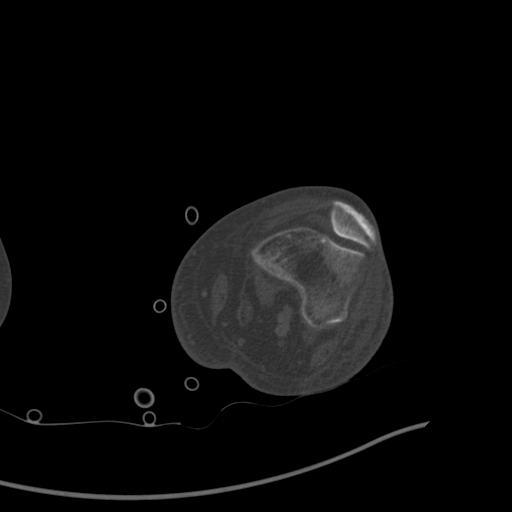
[im 101/327  bone]
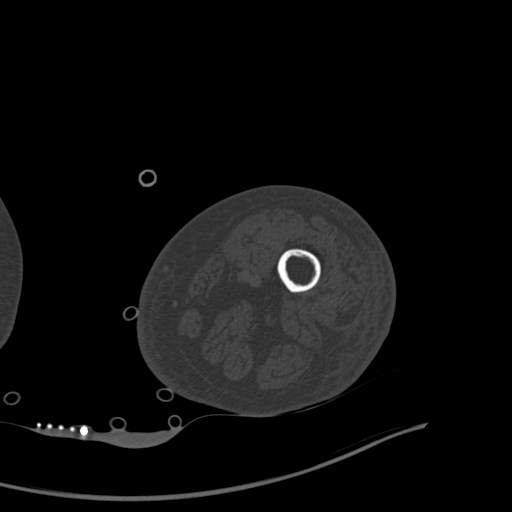
[im 126/327  bone]
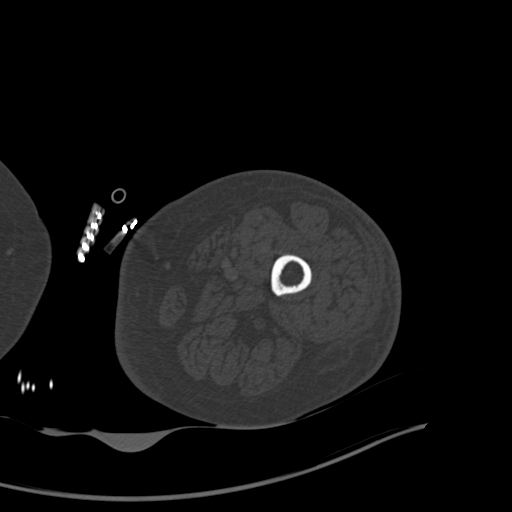
[im 151/327  soft-tissue]
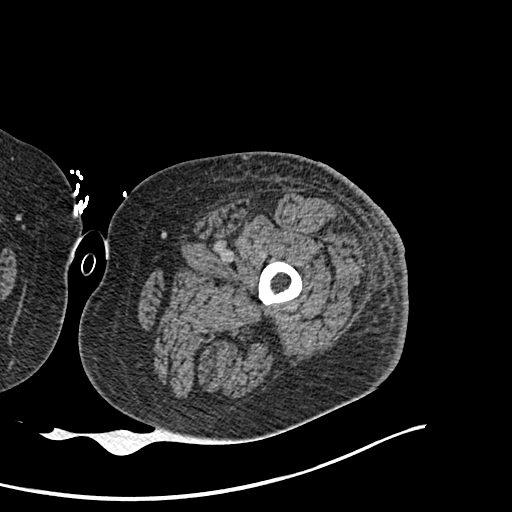
[im 151/327  bone]
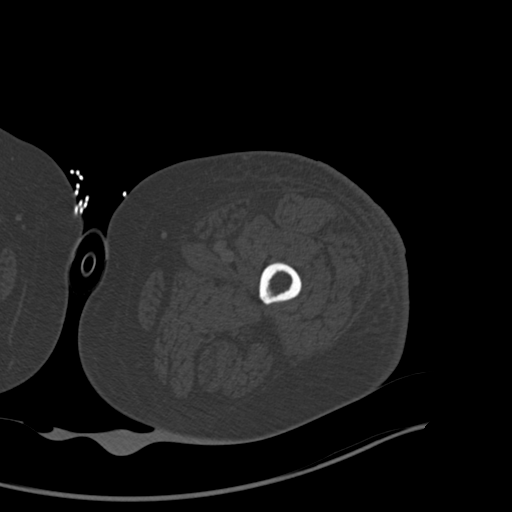
[im 176/327  bone]
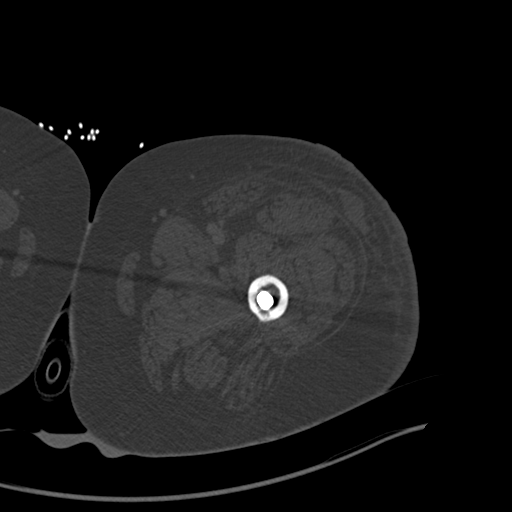
[im 201/327  bone]
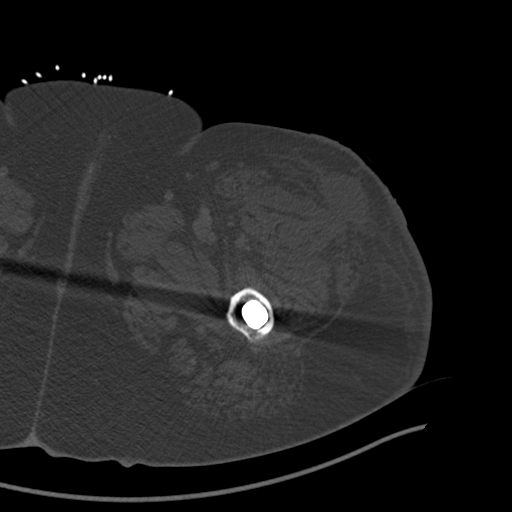
[im 251/327  bone]
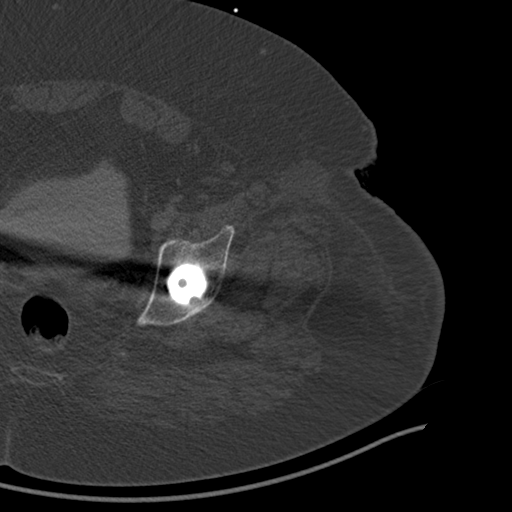
[im 276/327  soft-tissue]
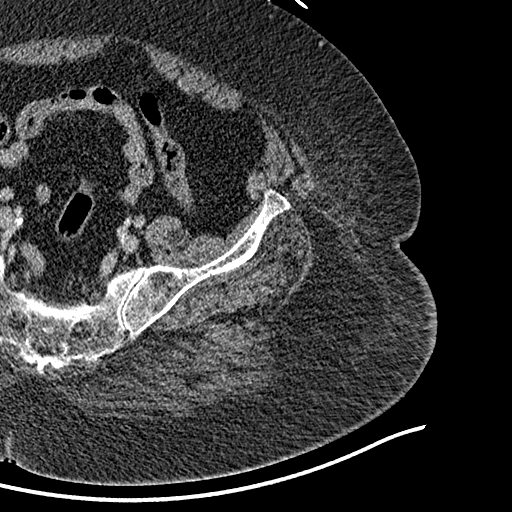
[im 276/327  bone]
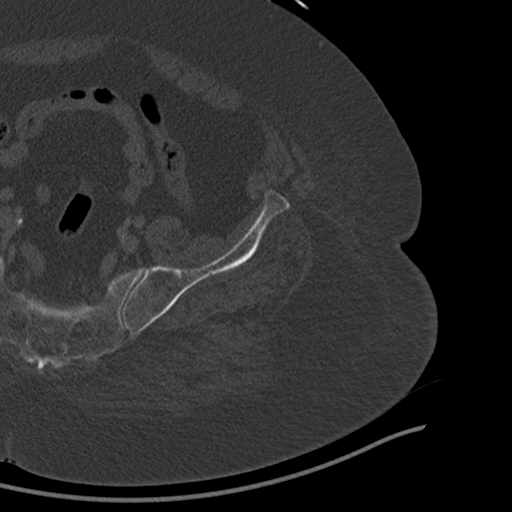
[im 301/327  bone]
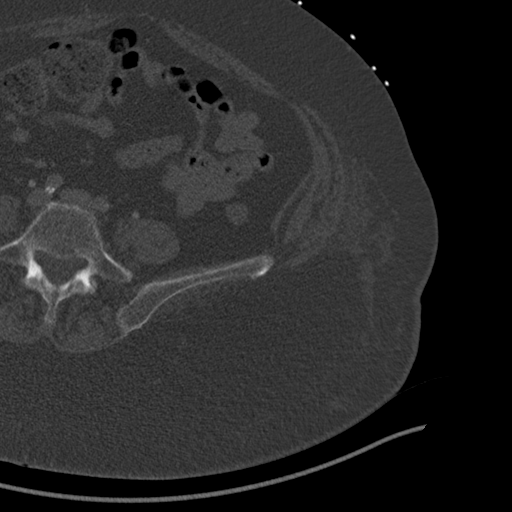

[Series 10: coronal st · coronal · 0.65mm/px · 3 of 132 slices shown]
[im 27/132  bone]
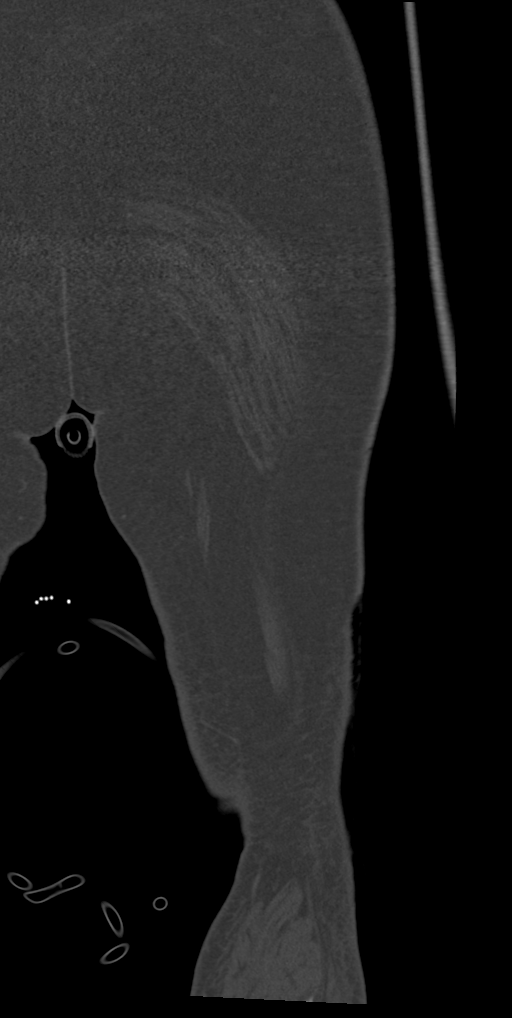
[im 53/132  bone]
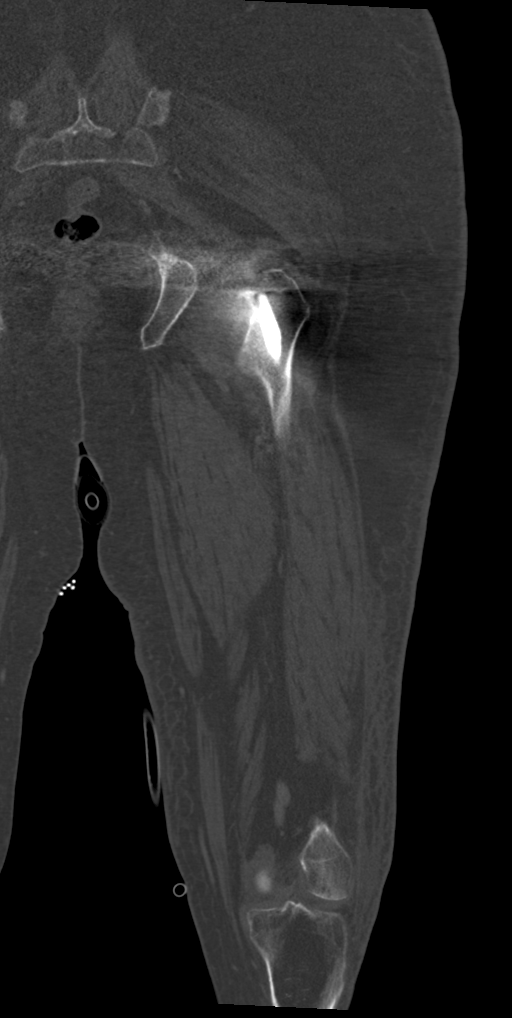
[im 79/132  bone]
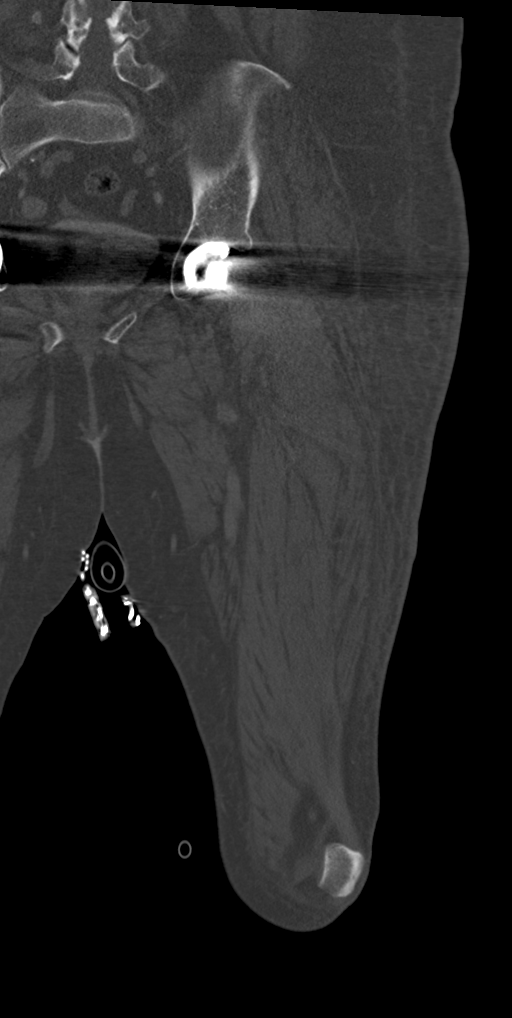

[13 of 35 positions shown; findings below may reference images not displayed]

RADIATION DOSE REDUCTION: This exam was performed according to the
departmental dose-optimization program which includes automated
exposure control, adjustment of the mA and/or kV according to
patient size and/or use of iterative reconstruction technique.

CONTRAST:  80mL OMNIPAQUE IOHEXOL 300 MG/ML  SOLN
FINDINGS: Bones/Joint/Cartilage

Left total hip arthroplasty. No evidence of hardware failure or
loosening. No fracture or dislocation. Joint spaces are preserved.
No joint effusion.

Ligaments

Ligaments are suboptimally evaluated by CT.

Muscles and Tendons
Grossly intact.

Soft tissue
Asymmetric skin thickening and soft tissue swelling of the left
thigh, worse laterally. 4.5 x 2.3 x 16.7 cm low-density fluid
collection in the anterior upper thigh (series 5, image 108; series
10, image 32) additional 1.6 x 5.4 x 9.4 cm low-density fluid
collection overlying the rectus femoris muscle in the anterior upper
thigh (series 5, image 124; series 10, image 37). These two fluid
collections may communicate superiorly (series 5, image 96). No
subcutaneous emphysema.
IMPRESSION: 1. Asymmetric skin thickening and soft tissue swelling of the left
thigh, worse laterally, consistent with clinical history of
cellulitis.
2. Two low-density fluid collections in the anterior upper thigh,
measuring up to 16.7 cm, concerning for abscesses given overlying
soft tissue inflammation. These two fluid collections may
communicate superiorly.
3. No acute osseous abnormality. Left total hip arthroplasty without
hardware complication.

## 2021-11-17 MED ORDER — HYDROMORPHONE HCL 1 MG/ML IJ SOLN
1.0000 mg | Freq: Once | INTRAMUSCULAR | Status: AC
  Administered 2021-11-17: 1 mg via INTRAVENOUS
  Filled 2021-11-17: qty 1

## 2021-11-17 MED ORDER — SODIUM CHLORIDE (PF) 0.9 % IJ SOLN
INTRAMUSCULAR | Status: AC
  Filled 2021-11-17: qty 50

## 2021-11-17 MED ORDER — LORAZEPAM 1 MG PO TABS
1.0000 mg | ORAL_TABLET | Freq: Once | ORAL | Status: AC
  Administered 2021-11-17: 1 mg via ORAL
  Filled 2021-11-17: qty 1

## 2021-11-17 MED ORDER — FENTANYL CITRATE PF 50 MCG/ML IJ SOSY
50.0000 ug | PREFILLED_SYRINGE | Freq: Once | INTRAMUSCULAR | Status: AC
  Administered 2021-11-17: 50 ug via INTRAVENOUS
  Filled 2021-11-17: qty 1

## 2021-11-17 MED ORDER — SODIUM CHLORIDE 0.9 % IV SOLN
2.0000 g | Freq: Once | INTRAVENOUS | Status: AC
  Administered 2021-11-17: 2 g via INTRAVENOUS
  Filled 2021-11-17: qty 12.5

## 2021-11-17 MED ORDER — LACTATED RINGERS IV SOLN: INTRAVENOUS | Status: AC

## 2021-11-17 MED ORDER — IOHEXOL 300 MG/ML  SOLN
100.0000 mL | Freq: Once | INTRAMUSCULAR | Status: AC | PRN
  Administered 2021-11-17: 100 mL via INTRAVENOUS

## 2021-11-17 MED ORDER — LACTATED RINGERS IV BOLUS
1000.0000 mL | Freq: Once | INTRAVENOUS | Status: AC
  Administered 2021-11-17: 1000 mL via INTRAVENOUS

## 2021-11-17 MED ORDER — LACTATED RINGERS IV BOLUS (SEPSIS)
2500.0000 mL | Freq: Once | INTRAVENOUS | Status: AC
  Administered 2021-11-17: 2500 mL via INTRAVENOUS

## 2021-11-17 MED ORDER — METRONIDAZOLE 500 MG/100ML IV SOLN
500.0000 mg | Freq: Once | INTRAVENOUS | Status: AC
  Administered 2021-11-17: 500 mg via INTRAVENOUS
  Filled 2021-11-17: qty 100

## 2021-11-17 MED ORDER — IOHEXOL 300 MG/ML  SOLN
100.0000 mL | Freq: Once | INTRAMUSCULAR | Status: AC | PRN
  Administered 2021-11-17: 80 mL via INTRAVENOUS

## 2021-11-17 MED ORDER — VANCOMYCIN HCL IN DEXTROSE 1-5 GM/200ML-% IV SOLN
1000.0000 mg | Freq: Once | INTRAVENOUS | Status: AC
  Administered 2021-11-17: 1000 mg via INTRAVENOUS
  Filled 2021-11-17: qty 200

## 2021-11-17 NOTE — H&P (Signed)
?History and Physical  ? ? ?Patient: [name] ZOX:[id number] DOB: 02/29/1961 ?DOA: 11/17/2021 ?DOS: the patient was seen and examined on 11/17/2021 ?PCP: Georganna SkeansWilson, Amelia, MD  ?Patient coming from: Home ? ?Chief Complaint:  ?Chief Complaint  ?Patient presents with  ? Hypotension  ? ?HPI: [name] is a 61 y.o. 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  Patient also noted swelling pain redness of the left hip where she had surgery a week ago.  She denies any sick contacts.  Patient is showing signs of sepsis.  She was febrile and some cough.  Evaluation showed that patient meets sepsis criteria with leukocytosis tachycardia and evidence of infection on chest x-ray.  Also severe sepsis with lactic acid of 2.9.  Additionally she appears to have cellulitis of her surgical sites.  Orthopedics was consulted.  Patient being admitted with severe sepsis due to pneumonia most likely. ? ?Review of Systems: As mentioned in the history of present illness. All other systems reviewed and are negative. ?Past Medical History:  ?Diagnosis Date  ? Anxiety   ? Arthritis   ? COPD (chronic obstructive pulmonary disease) (HCC)   ? uses oxygen 4L at nite  ? Depression   ? Dyspnea   ? GERD (gastroesophageal reflux disease)   ? Hypertension   ? Peripheral edema   ? 2+ at PAT visit  ? ?Past Surgical History:  ?Procedure Laterality Date  ? ABDOMINAL HYSTERECTOMY  1986  ? CARPAL TUNNEL RELEASE Bilateral 1980  ? Per patient  ? SHOULDER SURGERY Left 1980  ? 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  ? Procedure: RIGHT TOTAL HIP ARTHROPLASTY ANTERIOR APPROACH;  Surgeon: Gean Birchwoodowan, Frank, MD;   Location: [facility];  Service: Orthopedics;  Laterality: Right;  ? TOTAL HIP ARTHROPLASTY Left 11/11/2021  ? Procedure: LEFT TOTAL HIP ARTHROPLASTY ANTERIOR APPROACH;  Surgeon: Gean Birchwoodowan, Frank, MD;  Location: [facility];  Service: Orthopedics;  Laterality: Left;  ? ?Social History:  reports that she has been smoking cigarettes. She started smoking about 43 years ago. She has a 35.00 pack-year smoking history. She has never used smokeless tobacco. She reports that she does not drink alcohol and does not use drugs. ? ?Allergies  ?Allergen Reactions  ? Amoxicillin   ?  Yeast infection   ? ? ?Family History  ?Problem Relation Age of Onset  ? Ulcers Father   ? Hypotension Sister   ? Hypertension Brother   ? ? ?Prior to Admission medications   ?Medication Sig Start Date End Date Taking? Authorizing Provider  ?amLODipine (NORVASC) 10 MG tablet TAKE 1 TABLET(10 MG) BY MOUTH DAILY ?Patient taking differently: Take 10 mg by mouth daily. 09/06/21  Yes Georganna SkeansWilson, Amelia, MD  ?aspirin EC 81 MG tablet Take 1 tablet (81 mg total) by mouth 2 (two) times daily. 12/24/20  Yes Allena KatzPhillips, Eric K, PA-C  ?atorvastatin (LIPITOR) 40 MG tablet Take 1 tablet (40 mg total) by mouth daily. 12/18/20  Yes Arvilla MarketWallace, Catherine Lauren, MD  ?budesonide-formoterol (SYMBICORT) 160-4.5 MCG/ACT inhaler INHALE 2 PUFFS INTO THE LUNGS TWICE DAILY ?Patient taking differently: Inhale 2 puffs into the lungs 2 (two) times daily. 10/29/21  Yes Georganna SkeansWilson, Amelia, MD  ?busPIRone (BUSPAR) 10  MG tablet Take 1 tablet (10 mg total) by mouth 3 (three) times daily. 09/24/21  Yes Toy Cookey E, NP  ?doxycycline (VIBRAMYCIN) 100 MG capsule Take 100 mg by mouth 2 (two) times daily. 7 days 10/18/21  Yes [provider]  ?fluticasone furoate-vilanterol (BREO ELLIPTA) 100-25 MCG/INH AEPB Inhale 1 puff into the lungs daily. 01/07/21  Yes Arvilla Market, MD  ?folic acid (FOLVITE) 1 MG tablet TAKE 1 TABLET(1 MG) BY MOUTH DAILY ?Patient taking differently: Take 1 mg by mouth  daily. 09/30/21  Yes Georganna Skeans, MD  ?gabapentin (NEURONTIN) 300 MG capsule TAKE 1 CAPSULE(300 MG) BY MOUTH THREE TIMES DAILY ?Patient taking differently: Take 300 mg by mouth 3 (three) times daily. 10/01/21  Yes Georganna Skeans, MD  ?hydrOXYzine (ATARAX) 50 MG tablet TAKE 1 TABLET (50 MG TOTAL) BY MOUTH 3 (THREE) TIMES DAILY AS NEEDED FOR ANXIETY. ?Patient taking differently: Take 50 mg by mouth every 8 (eight) hours as needed for anxiety. 09/24/21  Yes Toy Cookey E, NP  ?ibuprofen (ADVIL) 200 MG tablet Take 600 mg by mouth daily as needed for moderate pain.   Yes [provider]  ?losartan (COZAAR) 100 MG tablet Take 1 tablet (100 mg total) by mouth daily. 10/31/21  Yes Georganna Skeans, MD  ?Multiple Vitamin (MULTIVITAMIN WITH MINERALS) TABS tablet Take 1 tablet by mouth daily.   Yes [provider]  ?oxybutynin (DITROPAN XL) 15 MG 24 hr tablet TAKE 1 TABLET(15 MG) BY MOUTH AT BEDTIME ?Patient taking differently: Take 15 mg by mouth daily. 11/01/21  Yes Georganna Skeans, MD  ?OXYGEN Inhale 4 L into the lungs at bedtime.   Yes [provider]  ?pantoprazole (PROTONIX) 40 MG tablet TAKE 1 TABLET(40 MG) BY MOUTH TWICE DAILY ?Patient taking differently: Take 40 mg by mouth 2 (two) times daily. 11/14/21  Yes Georganna Skeans, MD  ?POTASSIUM PO Take 1 tablet by mouth daily.   Yes [provider]  ?QUEtiapine (SEROQUEL) 300 MG tablet TAKE 1 TABLET(300 MG) BY MOUTH AT BEDTIME ?Patient taking differently: Take 300 mg by mouth at bedtime. 09/24/21  Yes Toy Cookey E, NP  ?sertraline (ZOLOFT) 100 MG tablet Take 2 tablets (200 mg total) by mouth daily. 09/24/21  Yes Toy Cookey E, NP  ?tizanidine (ZANAFLEX) 2 MG capsule Take 2 capsules (4 mg total) by mouth 3 (three) times daily as needed for muscle spasms. 11/14/21  Yes Gean Birchwood, MD  ?traMADol (ULTRAM) 50 MG tablet Take 50 mg by mouth every 6 (six) hours as needed for severe pain. 08/30/21  Yes [provider]  ?VITAMIN  D PO Take 1 capsule by mouth daily.   Yes [provider]  ?benzonatate (TESSALON) 100 MG capsule Take 1 capsule (100 mg total) by mouth 2 (two) times daily as needed for cough. ?Patient not taking: Reported on 11/17/2021 08/06/21   Georganna Skeans, MD  ?Menthol (ICY HOT) 5 % [facility] Apply 1 patch topically daily as needed (hip pain). ?Patient not taking: Reported on 11/17/2021    [provider]  ? ? ?Physical Exam: ?Vitals:  ? 11/17/21 2100 11/17/21 2130 11/17/21 2200 11/17/21 2230  ?BP: 124/61 (!) 103/55 93/82 (!) 125/54  ?Pulse: 93 95 96 99  ?Resp: 17 16 14 16   ?Temp:      ?TempSrc:      ?SpO2: 96% 94% 93% 95%  ? ?General: Acutely ill looking in mild distress. ?HEENT: PERRL EOMI, no jaundice no . ?Neck: supple, no JVD no lymphadenopathy ?Respiratory: Decreased air entry  at the bases with rhonchi, crackles mild expiratory wheezing ?Cardiovascular system: Sinus tachycardia ?Abdomen: Soft nontender with positive bowel sounds ?Extremity: Left thigh area swollen, tender, warm to touch around surgical site ? ?Data Reviewed: ? ?Sodium is 134, glucose 121, white count 13.7, hemoglobin 7.5 and platelets 384.  COVID-19 screen is so far pending CT of the left femur showed asymmetric skin thickening and soft tissue swelling of the left thigh consistent with cellulitis.  There were 2 low-density fluid collections in the anterior upper thigh about 16.7 cm concerning for abscess x-ray of the femur showed no acute osseous abnormalities CT abdomen pelvis showed bilateral airspace disease greatest within the left lower lobe consistent with multifocal bronchopneumonia chest x-ray showed no acute disease. ? ?Assessment and Plan: ? ?#1 sepsis syndrome: Patient has severe sepsis with lactic acidosis of 2.9.  This is due to pneumonia and to greater extent cellulitis with possible abscess of the left thigh.  We will admit the patient and treat her for HCAP since she has been recently in the hospital for surgery.  Also treated  for cellulitis.  Orthopedics aware.  She may require aspiration and cultures of the left thigh. ? ?#2 Healthcare associated pneumonia: Patient will be admitted and cultures already obtained.  Will be treated

## 2021-11-17 NOTE — ED Provider Notes (Signed)
?[facility]-EMERGENCY DEPT ?Provider Note ? ? ?CSN: [id number] ?Arrival date & time: 11/17/21  1517 ? ?  ? ?History ? ?Chief Complaint  ?Patient presents with  ? Hypotension  ? ? ?[name] is a 61 y.o. female. ? ?HPI ? ?62 year old female with medical history significant for COPD, HTN, GERD, anxiety, depression, osteoarthritis status post total hip replacement on the left By [facility] orthopedics, Dr. Turner Daniels who presents to the emergency department with left leg pain and low blood pressure. ? ?The patient has been doing rehabilitation at home from a hip replacement.  She has had decreased oral intake over the past few days and endorses lightheadedness and generalized weakness.  EMS was called to the house and her blood pressure was found to be hypotensive with a BP of 82/44 manual.  Her heart rate was 88 with thready radial pulses, O2 saturations 94%.  The patient was administered a fluid bolus with EMS and brought to the emergency department for further evaluation.  On arrival, the patient had several episodes of hypotension but on recheck with a larger cuff had episodes of normotension.  She endorsed persistent pain in her left leg with some redness and swelling.  She denies any chest pain, fever, chills, abdominal pain.  She endorses mild shortness of breath. ? ?Home Medications ?Prior to Admission medications   ?Medication Sig Start Date End Date Taking? Authorizing Provider  ?amLODipine (NORVASC) 10 MG tablet TAKE 1 TABLET(10 MG) BY MOUTH DAILY ?Patient taking differently: Take 10 mg by mouth daily. 09/06/21  Yes Georganna Skeans, MD  ?aspirin EC 81 MG tablet Take 1 tablet (81 mg total) by mouth 2 (two) times daily. 12/24/20  Yes Allena Katz, PA-C  ?atorvastatin (LIPITOR) 40 MG tablet Take 1 tablet (40 mg total) by mouth daily. 12/18/20  Yes Arvilla Market, MD  ?budesonide-formoterol (SYMBICORT) 160-4.5 MCG/ACT inhaler INHALE 2 PUFFS INTO THE LUNGS TWICE DAILY ?Patient taking  differently: Inhale 2 puffs into the lungs 2 (two) times daily. 10/29/21  Yes Georganna Skeans, MD  ?busPIRone (BUSPAR) 10 MG tablet Take 1 tablet (10 mg total) by mouth 3 (three) times daily. 09/24/21  Yes Toy Cookey E, NP  ?doxycycline (VIBRAMYCIN) 100 MG capsule Take 100 mg by mouth 2 (two) times daily. 7 days 10/18/21  Yes [provider]  ?fluticasone furoate-vilanterol (BREO ELLIPTA) 100-25 MCG/INH AEPB Inhale 1 puff into the lungs daily. 01/07/21  Yes Arvilla Market, MD  ?folic acid (FOLVITE) 1 MG tablet TAKE 1 TABLET(1 MG) BY MOUTH DAILY ?Patient taking differently: Take 1 mg by mouth daily. 09/30/21  Yes Georganna Skeans, MD  ?gabapentin (NEURONTIN) 300 MG capsule TAKE 1 CAPSULE(300 MG) BY MOUTH THREE TIMES DAILY ?Patient taking differently: Take 300 mg by mouth 3 (three) times daily. 10/01/21  Yes Georganna Skeans, MD  ?hydrOXYzine (ATARAX) 50 MG tablet TAKE 1 TABLET (50 MG TOTAL) BY MOUTH 3 (THREE) TIMES DAILY AS NEEDED FOR ANXIETY. ?Patient taking differently: Take 50 mg by mouth every 8 (eight) hours as needed for anxiety. 09/24/21  Yes Toy Cookey E, NP  ?ibuprofen (ADVIL) 200 MG tablet Take 600 mg by mouth daily as needed for moderate pain.   Yes [provider]  ?losartan (COZAAR) 100 MG tablet Take 1 tablet (100 mg total) by mouth daily. 10/31/21  Yes Georganna Skeans, MD  ?Multiple Vitamin (MULTIVITAMIN WITH MINERALS) TABS tablet Take 1 tablet by mouth daily.   Yes [provider]  ?oxybutynin (DITROPAN XL) 15 MG 24 hr tablet  TAKE 1 TABLET(15 MG) BY MOUTH AT BEDTIME ?Patient taking differently: Take 15 mg by mouth daily. 11/01/21  Yes Georganna Skeans, MD  ?OXYGEN Inhale 4 L into the lungs at bedtime.   Yes [provider]  ?pantoprazole (PROTONIX) 40 MG tablet TAKE 1 TABLET(40 MG) BY MOUTH TWICE DAILY ?Patient taking differently: Take 40 mg by mouth 2 (two) times daily. 11/14/21  Yes Georganna Skeans, MD  ?POTASSIUM PO Take 1 tablet by mouth daily.   Yes  [provider]  ?QUEtiapine (SEROQUEL) 300 MG tablet TAKE 1 TABLET(300 MG) BY MOUTH AT BEDTIME ?Patient taking differently: Take 300 mg by mouth at bedtime. 09/24/21  Yes Toy Cookey E, NP  ?sertraline (ZOLOFT) 100 MG tablet Take 2 tablets (200 mg total) by mouth daily. 09/24/21  Yes Toy Cookey E, NP  ?tizanidine (ZANAFLEX) 2 MG capsule Take 2 capsules (4 mg total) by mouth 3 (three) times daily as needed for muscle spasms. 11/14/21  Yes Gean Birchwood, MD  ?traMADol (ULTRAM) 50 MG tablet Take 50 mg by mouth every 6 (six) hours as needed for severe pain. 08/30/21  Yes [provider]  ?VITAMIN D PO Take 1 capsule by mouth daily.   Yes [provider]  ?benzonatate (TESSALON) 100 MG capsule Take 1 capsule (100 mg total) by mouth 2 (two) times daily as needed for cough. ?Patient not taking: Reported on 11/17/2021 08/06/21   Georganna Skeans, MD  ?Menthol (ICY HOT) 5 % [facility] Apply 1 patch topically daily as needed (hip pain). ?Patient not taking: Reported on 11/17/2021    [provider]  ?   ? ?Allergies    ?Amoxicillin   ? ?Review of Systems   ?Review of Systems  ?All other systems reviewed and are negative. ? ?Physical Exam ?Updated Vital Signs ?BP (!) 134/57 (BP Location: Left Arm)   Pulse (!) 106   Temp 98.5 ?F (36.9 ?C) (Oral)   Resp 20   SpO2 94%  ?Physical Exam ?Vitals and nursing note reviewed. Exam conducted with a chaperone present.  ?Constitutional:   ?   General: She is not in acute distress. ?   Appearance: She is well-developed.  ?HENT:  ?   Head: Normocephalic and atraumatic.  ?Eyes:  ?   Conjunctiva/sclera: Conjunctivae normal.  ?Cardiovascular:  ?   Rate and Rhythm: Normal rate and regular rhythm.  ?   Heart sounds: No murmur heard. ?Pulmonary:  ?   Effort: Pulmonary effort is normal. No respiratory distress.  ?   Breath sounds: Normal breath sounds.  ?Abdominal:  ?   Palpations: Abdomen is soft.  ?   Tenderness: There is no abdominal tenderness.   ?Genitourinary: ?   Rectum: Normal. Guaiac result negative.  ?   Comments: No melena or hematochezia ?Musculoskeletal:     ?   General: No swelling.  ?   Cervical back: Neck supple.  ?   Comments: Left lower extremity neurovascular intact, erythema and warmth palpated along the lateral aspect of the thigh around the patient's prior incision site, tenderness to palpation noted.  ?Skin: ?   General: Skin is warm and dry.  ?   Capillary Refill: Capillary refill takes less than 2 seconds.  ?Neurological:  ?   Mental Status: She is alert.  ?Psychiatric:     ?   Mood and Affect: Mood normal.  ? ? ?ED Results / Procedures / Treatments   ?Labs ?(all labs ordered are listed, but only abnormal results are displayed) ?Labs Reviewed  ?  CBC WITH DIFFERENTIAL/PLATELET - Abnormal; Notable for the following components:  ?    Result Value  ? WBC 13.7 (*)   ? RBC 2.45 (*)   ? Hemoglobin 7.5 (*)   ? HCT 22.8 (*)   ? Neutro Abs 8.9 (*)   ? Monocytes Absolute 1.7 (*)   ? Eosinophils Absolute 0.6 (*)   ? Abs Immature Granulocytes 0.43 (*)   ? All other components within normal limits  ?BASIC METABOLIC PANEL - Abnormal; Notable for the following components:  ? Sodium 134 (*)   ? Glucose, Bld 121 (*)   ? Calcium 8.1 (*)   ? All other components within normal limits  ?LACTIC ACID, PLASMA - Abnormal; Notable for the following components:  ? Lactic Acid, Venous 2.9 (*)   ? All other components within normal limits  ?LACTIC ACID, PLASMA - Abnormal; Notable for the following components:  ? Lactic Acid, Venous 2.5 (*)   ? All other components within normal limits  ?URINALYSIS, ROUTINE W REFLEX MICROSCOPIC - Abnormal; Notable for the following components:  ? Leukocytes,Ua TRACE (*)   ? All other components within normal limits  ?CULTURE, BLOOD (SINGLE)  ?CULTURE, BLOOD (ROUTINE X 2)  ?CULTURE, BLOOD (ROUTINE X 2)  ?URINE CULTURE  ?HEMOGLOBIN A1C  ?PROTIME-INR  ?CORTISOL-AM, BLOOD  ?PROCALCITONIN  ?COMPREHENSIVE METABOLIC PANEL  ?CBC  ?POC OCCULT  BLOOD, ED  ? ? ?EKG ?EKG Interpretation ? ?Date/Time:  Sunday November 17 2021 16:30:23 EDT ?Ventricular Rate:  93 ?PR Interval:  154 ?QRS Duration: 75 ?QT Interval:  363 ?QTC Calculation: 452 ?R Axis:   74 ?Text Interpretation: Si

## 2021-11-17 NOTE — ED Triage Notes (Addendum)
Per EMS pt from home.  Pt has been doing rehab at home for hip replacement from 4/12.  Has not been moving much today.  Very low fluid intake.  1-2 ginger ales a day. Dizziness. Weakness. Pale.  BP with EMS 82/44 manual.  HR 80, thready radial pulses per EMS.  O2 94%.  Is a smoker and COPD hx. On Room air. 20G RAC.  750 of NS given en route.  ?

## 2021-11-17 NOTE — ED Notes (Signed)
Pt has been placed on a purwick to retain a urine sample. ?

## 2021-11-17 NOTE — Sepsis Progress Note (Signed)
Sepsis protocol is being followed by [facility]. 

## 2021-11-17 NOTE — Progress Notes (Signed)
A consult was received from an ED physician for vancomycin and cefepime per pharmacy dosing.  The patient's profile has been reviewed for ht/wt/allergies/indication/available labs.   ?A one time order has been placed for vancomycin 2000mg  IV x1 and cefepime 2g IV x1.   ? ?Further antibiotics/pharmacy consults should be ordered by admitting physician if indicated.       ?                ?Thank you, ? ?Dimple Nanas, PharmD ?11/17/2021 5:29 PM ? ?

## 2021-11-18 ENCOUNTER — Other Ambulatory Visit: Payer: Self-pay

## 2021-11-18 DIAGNOSIS — J189 Pneumonia, unspecified organism: Secondary | ICD-10-CM | POA: Diagnosis not present

## 2021-11-18 DIAGNOSIS — A419 Sepsis, unspecified organism: Secondary | ICD-10-CM | POA: Diagnosis not present

## 2021-11-18 LAB — BASIC METABOLIC PANEL
Anion gap: 5 (ref 5–15)
BUN: 8 mg/dL (ref 8–23)
CO2: 25 mmol/L (ref 22–32)
Calcium: 8.6 mg/dL — ABNORMAL LOW (ref 8.9–10.3)
Chloride: 111 mmol/L (ref 98–111)
Creatinine, Ser: 0.67 mg/dL (ref 0.44–1.00)
GFR, Estimated: >60 mL/min (ref >60)
Glucose, Bld: 132 mg/dL — ABNORMAL HIGH (ref 70–99)
Potassium: 3.7 mmol/L (ref 3.5–5.1)
Sodium: 141 mmol/L (ref 135–145)

## 2021-11-18 LAB — CBC
HCT: 22.9 % — ABNORMAL LOW (ref 36.0–46.0)
Hemoglobin: 7.2 g/dL — ABNORMAL LOW (ref 12.0–15.0)
MCH: 29.8 pg (ref 26.0–34.0)
MCHC: 31.4 g/dL (ref 30.0–36.0)
MCV: 94.6 fL (ref 80.0–100.0)
Platelets: 382 10*3/uL (ref 150–400)
RBC: 2.42 MIL/uL — ABNORMAL LOW (ref 3.87–5.11)
RDW: 13.3 % (ref 11.5–15.5)
WBC: 11.8 10*3/uL — ABNORMAL HIGH (ref 4.0–10.5)
nRBC: 0.2 % (ref 0.0–0.2)

## 2021-11-18 LAB — COMPREHENSIVE METABOLIC PANEL
ALT: 24 U/L (ref 0–44)
AST: 20 U/L (ref 15–41)
Albumin: 2.5 g/dL — ABNORMAL LOW (ref 3.5–5.0)
Alkaline Phosphatase: 84 U/L (ref 38–126)
Anion gap: 7 (ref 5–15)
BUN: 10 mg/dL (ref 8–23)
CO2: 25 mmol/L (ref 22–32)
Calcium: 8.4 mg/dL — ABNORMAL LOW (ref 8.9–10.3)
Chloride: 107 mmol/L (ref 98–111)
Creatinine, Ser: 0.62 mg/dL (ref 0.44–1.00)
GFR, Estimated: >60 mL/min (ref >60)
Glucose, Bld: 136 mg/dL — ABNORMAL HIGH (ref 70–99)
Potassium: 3.8 mmol/L (ref 3.5–5.1)
Sodium: 139 mmol/L (ref 135–145)
Total Bilirubin: 0.5 mg/dL (ref 0.3–1.2)
Total Protein: 5.3 g/dL — ABNORMAL LOW (ref 6.5–8.1)

## 2021-11-18 LAB — HEMOGLOBIN A1C
Hgb A1c MFr Bld: 6.1 % — ABNORMAL HIGH (ref 4.8–5.6)
Mean Plasma Glucose: 128.37 mg/dL

## 2021-11-18 LAB — GLUCOSE, CAPILLARY
Glucose-Capillary: 139 mg/dL — ABNORMAL HIGH (ref 70–99)
Glucose-Capillary: 159 mg/dL — ABNORMAL HIGH (ref 70–99)
Glucose-Capillary: 156 mg/dL — ABNORMAL HIGH (ref 70–99)
Glucose-Capillary: 118 mg/dL — ABNORMAL HIGH (ref 70–99)

## 2021-11-18 LAB — PROTIME-INR
INR: 1.2 (ref 0.8–1.2)
Prothrombin Time: 14.6 seconds (ref 11.4–15.2)

## 2021-11-18 LAB — MRSA NEXT GEN BY PCR, NASAL: MRSA by PCR Next Gen: NOT DETECTED

## 2021-11-18 LAB — PROCALCITONIN: Procalcitonin: <0.1 ng/mL

## 2021-11-18 LAB — CORTISOL-AM, BLOOD: Cortisol - AM: 4.4 ug/dL — ABNORMAL LOW (ref 6.7–22.6)

## 2021-11-18 MED ORDER — INSULIN ASPART 100 UNIT/ML IJ SOLN: 0.0000 [IU] | Freq: Every day | INTRAMUSCULAR | Status: DC

## 2021-11-18 MED ORDER — FLUTICASONE FUROATE-VILANTEROL 100-25 MCG/INH IN AEPB: 1.0000 | INHALATION_SPRAY | Freq: Every day | RESPIRATORY_TRACT | Status: DC

## 2021-11-18 MED ORDER — VANCOMYCIN HCL IN DEXTROSE 1-5 GM/200ML-% IV SOLN: 1000.0000 mg | Freq: Once | INTRAVENOUS | Status: DC

## 2021-11-18 MED ORDER — ONDANSETRON HCL 4 MG PO TABS: 4.0000 mg | ORAL_TABLET | Freq: Four times a day (QID) | ORAL | Status: DC | PRN

## 2021-11-18 MED ORDER — VANCOMYCIN HCL 1750 MG/350ML IV SOLN: 1750.0000 mg | INTRAVENOUS | Status: DC

## 2021-11-18 MED ORDER — ATORVASTATIN CALCIUM 40 MG PO TABS
40.0000 mg | ORAL_TABLET | Freq: Every day | ORAL | Status: DC
  Administered 2021-11-18 – 2021-11-20 (×3): 40 mg via ORAL
  Filled 2021-11-18 (×3): qty 1

## 2021-11-18 MED ORDER — TIZANIDINE HCL 4 MG PO TABS
4.0000 mg | ORAL_TABLET | Freq: Three times a day (TID) | ORAL | Status: DC | PRN
  Administered 2021-11-18 – 2021-11-20 (×5): 4 mg via ORAL
  Filled 2021-11-18 (×5): qty 1

## 2021-11-18 MED ORDER — IBUPROFEN 200 MG PO TABS
600.0000 mg | ORAL_TABLET | Freq: Every day | ORAL | Status: DC | PRN
  Administered 2021-11-18: 600 mg via ORAL
  Filled 2021-11-18: qty 3

## 2021-11-18 MED ORDER — MOMETASONE FURO-FORMOTEROL FUM 200-5 MCG/ACT IN AERO
2.0000 | INHALATION_SPRAY | Freq: Two times a day (BID) | RESPIRATORY_TRACT | Status: DC
Start: 2021-11-18 — End: 2021-11-20
  Administered 2021-11-18 – 2021-11-20 (×5): 2 via RESPIRATORY_TRACT
  Filled 2021-11-18: qty 8.8

## 2021-11-18 MED ORDER — BUSPIRONE HCL 5 MG PO TABS
10.0000 mg | ORAL_TABLET | Freq: Three times a day (TID) | ORAL | Status: DC
  Administered 2021-11-18 – 2021-11-20 (×7): 10 mg via ORAL
  Filled 2021-11-18 (×7): qty 2

## 2021-11-18 MED ORDER — QUETIAPINE FUMARATE 100 MG PO TABS
300.0000 mg | ORAL_TABLET | Freq: Every day | ORAL | Status: DC
  Administered 2021-11-18 – 2021-11-19 (×3): 300 mg via ORAL
  Filled 2021-11-18 (×3): qty 3

## 2021-11-18 MED ORDER — AMLODIPINE BESYLATE 10 MG PO TABS
10.0000 mg | ORAL_TABLET | Freq: Every day | ORAL | Status: DC
  Administered 2021-11-18 – 2021-11-20 (×3): 10 mg via ORAL
  Filled 2021-11-18 (×3): qty 1

## 2021-11-18 MED ORDER — ADULT MULTIVITAMIN W/MINERALS CH
1.0000 | ORAL_TABLET | Freq: Every day | ORAL | Status: DC
  Administered 2021-11-18 – 2021-11-20 (×3): 1 via ORAL
  Filled 2021-11-18 (×3): qty 1

## 2021-11-18 MED ORDER — HYDROXYZINE HCL 25 MG PO TABS
50.0000 mg | ORAL_TABLET | Freq: Three times a day (TID) | ORAL | Status: DC | PRN
  Administered 2021-11-18: 50 mg via ORAL
  Filled 2021-11-18: qty 2

## 2021-11-18 MED ORDER — ASPIRIN EC 81 MG PO TBEC
81.0000 mg | DELAYED_RELEASE_TABLET | Freq: Two times a day (BID) | ORAL | Status: DC
  Administered 2021-11-18 – 2021-11-20 (×6): 81 mg via ORAL
  Filled 2021-11-18 (×6): qty 1

## 2021-11-18 MED ORDER — LOSARTAN POTASSIUM 50 MG PO TABS
100.0000 mg | ORAL_TABLET | Freq: Every day | ORAL | Status: DC
  Administered 2021-11-18 – 2021-11-20 (×3): 100 mg via ORAL
  Filled 2021-11-18 (×3): qty 2

## 2021-11-18 MED ORDER — ACETAMINOPHEN 650 MG RE SUPP: 650.0000 mg | Freq: Four times a day (QID) | RECTAL | Status: DC | PRN

## 2021-11-18 MED ORDER — INSULIN ASPART 100 UNIT/ML IJ SOLN
0.0000 [IU] | Freq: Three times a day (TID) | INTRAMUSCULAR | Status: DC
  Administered 2021-11-18 (×2): 4 [IU] via SUBCUTANEOUS

## 2021-11-18 MED ORDER — TRAMADOL HCL 50 MG PO TABS
50.0000 mg | ORAL_TABLET | Freq: Four times a day (QID) | ORAL | Status: DC | PRN
  Administered 2021-11-18 – 2021-11-19 (×5): 50 mg via ORAL
  Filled 2021-11-18 (×5): qty 1

## 2021-11-18 MED ORDER — VANCOMYCIN HCL 1750 MG/350ML IV SOLN
1750.0000 mg | INTRAVENOUS | Status: DC
  Filled 2021-11-18: qty 350

## 2021-11-18 MED ORDER — PANTOPRAZOLE SODIUM 40 MG PO TBEC
40.0000 mg | DELAYED_RELEASE_TABLET | Freq: Two times a day (BID) | ORAL | Status: DC
  Administered 2021-11-18 – 2021-11-20 (×6): 40 mg via ORAL
  Filled 2021-11-18 (×6): qty 1

## 2021-11-18 MED ORDER — GABAPENTIN 300 MG PO CAPS
300.0000 mg | ORAL_CAPSULE | Freq: Three times a day (TID) | ORAL | Status: DC
  Administered 2021-11-18 – 2021-11-20 (×7): 300 mg via ORAL
  Filled 2021-11-18 (×7): qty 1

## 2021-11-18 MED ORDER — SODIUM CHLORIDE 0.9 % IV SOLN
2.0000 g | Freq: Three times a day (TID) | INTRAVENOUS | Status: DC
  Administered 2021-11-18 – 2021-11-20 (×8): 2 g via INTRAVENOUS
  Filled 2021-11-18 (×8): qty 12.5

## 2021-11-18 MED ORDER — IPRATROPIUM-ALBUTEROL 0.5-2.5 (3) MG/3ML IN SOLN
3.0000 mL | RESPIRATORY_TRACT | Status: DC | PRN
  Administered 2021-11-18 (×2): 3 mL via RESPIRATORY_TRACT
  Filled 2021-11-18 (×3): qty 3

## 2021-11-18 MED ORDER — ACETAMINOPHEN 325 MG PO TABS
650.0000 mg | ORAL_TABLET | Freq: Four times a day (QID) | ORAL | Status: DC | PRN
  Administered 2021-11-19: 650 mg via ORAL
  Filled 2021-11-18: qty 2

## 2021-11-18 MED ORDER — ENOXAPARIN SODIUM 40 MG/0.4ML IJ SOSY
40.0000 mg | PREFILLED_SYRINGE | INTRAMUSCULAR | Status: DC
  Administered 2021-11-18 – 2021-11-20 (×3): 40 mg via SUBCUTANEOUS
  Filled 2021-11-18 (×3): qty 0.4

## 2021-11-18 MED ORDER — SODIUM CHLORIDE 0.9 % IV SOLN: 2.0000 g | Freq: Once | INTRAVENOUS | Status: DC

## 2021-11-18 MED ORDER — OXYBUTYNIN CHLORIDE ER 5 MG PO TB24
15.0000 mg | ORAL_TABLET | Freq: Every day | ORAL | Status: DC
  Administered 2021-11-18 – 2021-11-20 (×3): 15 mg via ORAL
  Filled 2021-11-18 (×3): qty 3

## 2021-11-18 MED ORDER — SERTRALINE HCL 100 MG PO TABS
200.0000 mg | ORAL_TABLET | Freq: Every day | ORAL | Status: DC
  Administered 2021-11-18 – 2021-11-20 (×3): 200 mg via ORAL
  Filled 2021-11-18 (×3): qty 2

## 2021-11-18 MED ORDER — ONDANSETRON HCL 4 MG/2ML IJ SOLN
4.0000 mg | Freq: Four times a day (QID) | INTRAMUSCULAR | Status: DC | PRN
Start: 2021-11-18 — End: 2021-11-20
  Administered 2021-11-19: 4 mg via INTRAVENOUS
  Filled 2021-11-18: qty 2

## 2021-11-18 MED ORDER — FOLIC ACID 1 MG PO TABS
1.0000 mg | ORAL_TABLET | Freq: Every day | ORAL | Status: DC
  Administered 2021-11-18 – 2021-11-20 (×3): 1 mg via ORAL
  Filled 2021-11-18 (×3): qty 1

## 2021-11-18 MED ORDER — GUAIFENESIN-DM 100-10 MG/5ML PO SYRP
5.0000 mL | ORAL_SOLUTION | ORAL | Status: DC | PRN
  Administered 2021-11-18 – 2021-11-19 (×3): 5 mL via ORAL
  Filled 2021-11-18 (×3): qty 10

## 2021-11-18 MED ORDER — BENZONATATE 100 MG PO CAPS
100.0000 mg | ORAL_CAPSULE | Freq: Two times a day (BID) | ORAL | Status: DC | PRN
  Administered 2021-11-18 – 2021-11-20 (×3): 100 mg via ORAL
  Filled 2021-11-18 (×3): qty 1

## 2021-11-18 NOTE — Progress Notes (Addendum)
Pharmacy Antibiotic Note ? ?[name] is a 61 y.o. female admitted on 11/17/2021 with sepsis- concern for PNA + cellulits with possible abscess at surgical site (L femur).  Pharmacy has been consulted for Aztreonam + Vancomycin dosing. ? ?Documented "allergy" to amoxicillin = yeast infection.  Patient had dose of Cefepime in ED this afternoon.  Confirmed with RN that she tolerated this dose without any s/sx of an allergic reaction.   ? ?AKI- Scr 1.85 on admission; baseline <1.0 ? ?Plan: ?Change Aztreonam to Cefepime 2gm IV q8h ?Vancomycin 1750mg  IV q24h ?Monitor renal function and cx data  ? ?  ? ?Temp (24hrs), Avg:98.5 ?F (36.9 ?C), Min:98.3 ?F (36.8 ?C), Max:98.6 ?F (37 ?C) ? ?Recent Labs  ?Lab 11/12/21 ?01/12/22 11/13/21 ?0346 11/14/21 ?0340 11/17/21 ?1614 11/17/21 ?1743 11/17/21 ?2058  ?WBC 23.9* 18.0* 12.5* 13.7*  --   --   ?CREATININE 1.85*  --  0.65 0.92  --   --   ?LATICACIDVEN  --   --   --   --  2.9* 2.5*  ?  ?Estimated Creatinine Clearance: 74.4 mL/min (by C-G formula based on SCr of 0.92 mg/dL).   ? ?Allergies  ?Allergen Reactions  ? Amoxicillin   ?  Yeast infection   ? ? ?Antimicrobials this admission: ?4/16 Cefepime >>  ?4/16 Vancomycin >>  ?4/16 Metronidazole >> ? ?Dose adjustments this admission: ? ?Microbiology results: ?4/16 BCx:  ?4/16 UCx:   ? ?Thank you for allowing pharmacy to be a part of this patient?s care. ? ?5/16 PharmD ?11/18/2021 12:19 AM ? ?

## 2021-11-18 NOTE — Progress Notes (Signed)
?[facility] Progress Note ? ?Patient: [name]     ?WCB:[id number]  ?DOA: 11/17/2021   ?PCP: Georganna Skeans, MD  ? ?  ?  ?Brief hospital course: ?This is a 61 year old female with COPD, depression and anxiety, alcohol abuse in remission, osteoarthritis who is status post left total hip replacement on 4/10. ?She returned to the ED on 4/16 for dizziness & weakness and was found to have a BP of 82/44, lactic acid of 2.9.  ?CT of the chest revealed: Bilateral airspace disease, greatest within the left lower lobe, consistent with multifocal bronchopneumonia. ?Started on Cefepime on Vancomycin. Also suspicion of infection of hip incision. Ortho was consulted. ? ?Subjective:  ?Has a cough. No other complaints.  ?Assessment and Plan: ?  ?Principal Problem: ?  Sepsis due to bilateral pneumonia (HCC) ?- blood cultures remain negative ?- dc Vancomycin ?- cont Cefepime and follow symptoms ?- lactic acid 2.9 > 2.5  ?- per ortho, left hip wound does not appear infected ? ?Active Problems: ? ?Anemia ?- due to acute blood loss related to hip surgery ?- FOB is negative ?- Hgb 10.6 > 8.1 > 7.2 ? ?  Generalized anxiety disorder ?- cont Buspar, Seroquel, PRN Hydroxyzine and Zoloft ? ?  Type 2 diabetes mellitus (HCC) ?- continue Novolog sliding scale ?- Hemoglobin A1C ?   ?Component Value Date/Time  ? HGBA1C 6.1 (H) 11/17/2021 1614  ?  ?  Osteoarthritis of left hip ?  Status post total hip replacement, left ?- cont PT ? ?HTN ?- cont Losartan and Amlodipine ? ?COPD ?- no exacerbation noted at this time ?- cont Dulera ? ?  Liver cirrhosis (HCC) ? Alcohol use disorder, severe, in sustained remission (HCC) ? ? ? ? ?DVT prophylaxis:  enoxaparin (LOVENOX) injection 40 mg Start: 11/18/21 1000 ? ?  Code Status: Full Code  ?Level of Care: Level of care: Progressive ?Disposition Plan:  ?Status is: Inpatient ?Remains inpatient appropriate because: treating pneumonia ? ?Objective: ?  ?Vitals:  ? 11/18/21 0059 11/18/21 0505 11/18/21 0825  11/18/21 1243  ?BP: (!) 134/57 107/81  100/61  ?Pulse: (!) 106 95  96  ?Resp: 20 19  18   ?Temp: 98.5 ?F (36.9 ?C) 98.5 ?F (36.9 ?C)  98.2 ?F (36.8 ?C)  ?TempSrc: Oral Oral  Oral  ?SpO2: 94% 99% 93% 99%  ? ?There were no vitals filed for this visit. ?Exam: ?General exam: Appears comfortable  ?HEENT: PERRLA, oral mucosa moist, no sclera icterus or thrush ?Respiratory system: rhonchi bilaterally, congested cough, ?Cardiovascular system: S1 & S2 heard, regular rate and rhythm ?Gastrointestinal system: Abdomen soft, non-tender, nondistended. Normal bowel sounds   ?Central nervous system: Alert and oriented. No focal neurological deficits. ?Extremities: No cyanosis, clubbing or edema- swelling of left hip noted ?Skin: No rashes or ulcers ?Psychiatry:  Mood & affect appropriate.   ? ?Imaging and lab data was personally reviewed ? ? ? CBC: ?Recent Labs  ?Lab 11/12/21 ?01/12/22 11/13/21 ?0346 11/14/21 ?11/16/21 11/17/21 ?1614 11/18/21 ?0434  ?WBC 23.9* 18.0* 12.5* 13.7* 11.8*  ?NEUTROABS  --   --   --  8.9*  --   ?HGB 10.6* 9.1* 8.1* 7.5* 7.2*  ?HCT 34.9* 28.3* 24.4* 22.8* 22.9*  ?MCV 96.4 93.1 91.0 93.1 94.6  ?PLT 322 258 252 384 382  ? ?Basic Metabolic Panel: ?Recent Labs  ?Lab 11/12/21 ?01/12/22 11/14/21 ?11/16/21 11/17/21 ?1614 11/18/21 ?0434  ?NA 132* 136 134* 139  ?K 5.7* 4.1 3.5 3.8  ?CL 99 100 101 107  ?CO2 23 30  25 25  ?GLUCOSE 196* 154* 121* 136*  ?BUN 15 14 15 10   ?CREATININE 1.85* 0.65 0.92 0.62  ?CALCIUM 8.6* 8.9 8.1* 8.4*  ? ?GFR: ?Estimated Creatinine Clearance: 85.6 mL/min (by C-G formula based on SCr of 0.62 mg/dL). ? ?Scheduled Meds: ? amLODipine  10 mg Oral Daily  ? aspirin EC  81 mg Oral BID  ? atorvastatin  40 mg Oral Daily  ? busPIRone  10 mg Oral TID  ? enoxaparin (LOVENOX) injection  40 mg Subcutaneous Q24H  ? folic acid  1 mg Oral Daily  ? gabapentin  300 mg Oral TID  ? insulin aspart  0-20 Units Subcutaneous TID WC  ? insulin aspart  0-5 Units Subcutaneous QHS  ? losartan  100 mg Oral Daily  ? mometasone-formoterol   2 puff Inhalation BID  ? multivitamin with minerals  1 tablet Oral Daily  ? oxybutynin  15 mg Oral Daily  ? pantoprazole  40 mg Oral BID  ? QUEtiapine  300 mg Oral QHS  ? sertraline  200 mg Oral Daily  ? ?Continuous Infusions: ? ceFEPime (MAXIPIME) IV Stopped (11/18/21 1129)  ? vancomycin    ? ? ? LOS: 1 day  ? ?Author: ?11/20/21  ?11/18/2021 5:55 PM ?   ?

## 2021-11-18 NOTE — Progress Notes (Signed)
Pt's blood sugar was checked this morning and was 139. Per the sliding scale order for this pt she should get 3 units of insulin. Pt declined insulin at this time. She states that she does not use insulin at home and does not want it.  ?

## 2021-11-18 NOTE — Telephone Encounter (Signed)
Has been re-admitted

## 2021-11-18 NOTE — Consult Note (Signed)
Reason for Consult:Concerns for Abscess left hip following anterior THA on 11/11/21 ?Referring Physician: Dr. Josephina Gip ? ?[name] is an 60 y.o. female.  ? ?HPI: 61 year old female with medical history significant for COPD, HTN, GERD, anxiety, depression, osteoarthritis status post total hip replacement on the left By [facility] orthopedics, Dr. Turner Daniels who presents to the emergency department with left leg pain and low blood pressure. She was admitted to the medicine service with pneumonia and  sepsis with concerns for abscess of left hip. ? ?Past Medical History:  ?Diagnosis Date  ? Anxiety   ? Arthritis   ? COPD (chronic obstructive pulmonary disease) (HCC)   ? uses oxygen 4L at nite  ? Depression   ? Dyspnea   ? GERD (gastroesophageal reflux disease)   ? Hypertension   ? Peripheral edema   ? 2+ at PAT visit  ? ? ?Past Surgical History:  ?Procedure Laterality Date  ? ABDOMINAL HYSTERECTOMY  1986  ? CARPAL TUNNEL RELEASE Bilateral 1980  ? Per patient  ? SHOULDER SURGERY Left 1980  ? 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  ? Procedure: RIGHT TOTAL HIP ARTHROPLASTY ANTERIOR APPROACH;  Surgeon: Gean Birchwood, MD;  Location: [facility];  Service: Orthopedics;  Laterality: Right;  ? TOTAL HIP ARTHROPLASTY Left 11/11/2021  ? Procedure: LEFT TOTAL HIP ARTHROPLASTY ANTERIOR APPROACH;  Surgeon: Gean Birchwood, MD;  Location: [facility] OR[facility];  Service: Orthopedics;  Laterality: Left;  ? ? ?Family History  ?Problem Relation Age of Onset  ? Ulcers Father   ? Hypotension Sister   ? Hypertension Brother   ? ? ?Social History:  reports that she has been smoking cigarettes. She started smoking about 43 years ago. She has a 35.00 pack-year smoking history. She has never used smokeless tobacco. She reports that she does not drink alcohol and does not use drugs. ? ?Allergies:  ?Allergies  ?Allergen Reactions  ? Amoxicillin   ?  Yeast infection ; tolerated Cefepime  ? ? ?Medications: I have reviewed the patient's current medications. ? ?Results  for orders placed or performed during the hospital encounter of 11/17/21 (from the past 48 hour(s))  ?CBC with Differential     Status: Abnormal  ? Collection Time: 11/17/21  4:14 PM  ?Result Value Ref Range  ? WBC 13.7 (H) 4.0 - 10.5 K/uL  ? RBC 2.45 (L) 3.87 - 5.11 MIL/uL  ? Hemoglobin 7.5 (L) 12.0 - 15.0 g/dL  ? HCT 22.8 (L) 36.0 - 46.0 %  ? MCV 93.1 80.0 - 100.0 fL  ? MCH 30.6 26.0 - 34.0 pg  ? MCHC 32.9 30.0 - 36.0 g/dL  ? RDW 13.1 11.5 - 15.5 %  ? Platelets 384 150 - 400 K/uL  ? nRBC 0.2 0.0 - 0.2 %  ? Neutrophils Relative % 65 %  ? Neutro Abs 8.9 (H) 1.7 - 7.7 K/uL  ? Lymphocytes Relative 15 %  ? Lymphs Abs 2.0 0.7 - 4.0 K/uL  ? Monocytes Relative 12 %  ? Monocytes Absolute 1.7 (H) 0.1 - 1.0 K/uL  ? Eosinophils Relative 4 %  ? Eosinophils Absolute 0.6 (H) 0.0 - 0.5 K/uL  ? Basophils Relative 1 %  ? Basophils Absolute 0.1 0.0 - 0.1 K/uL  ? Immature Granulocytes 3 %  ? Abs Immature Granulocytes 0.43 (H) 0.00 - 0.07 K/uL  ?  Comment: Performed at [facility], [address] [place], [place] [address]  ?Basic metabolic panel     Status: Abnormal  ? Collection Time: 11/17/21  4:14 PM  ?Result Value Ref Range  ? Sodium 134 (L) 135 - 145 mmol/L  ? Potassium 3.5 3.5 - 5.1 mmol/L  ? Chloride 101 98 - 111 mmol/L  ? CO2 25 22 - 32 mmol/L  ? Glucose, Bld 121 (H) 70 - 99 mg/dL  ?  Comment: Glucose reference range applies only to samples taken after fasting for at least 8 hours.  ? BUN 15 8 - 23 mg/dL  ? Creatinine, Ser 0.92 0.44 - 1.00 mg/dL  ? Calcium 8.1 (L) 8.9 - 10.3 mg/dL  ? GFR, Estimated >60 >60 mL/min  ?  Comment: (NOTE) ?Calculated using the CKD-EPI Creatinine Equation (2021) ?  ? Anion gap 8 5 - 15  ?  Comment: Performed at [facility], [address], [place], [place] [address]  ?Hemoglobin A1c     Status: Abnormal  ? Collection Time: 11/17/21  4:14 PM  ?Result Value Ref Range  ? Hgb A1c MFr Bld 6.1 (H) 4.8 - 5.6 %  ?  Comment: (NOTE) ?Pre diabetes:           5.7%-6.4% ? ?Diabetes:              >6.4% ? ?Glycemic control for   <7.0% ?adults with diabetes ?  ? Mean Plasma Glucose 128.37 mg/dL  ?  Comment: Performed at [facility], [address] [place], [place] [address]  ?Lactic acid, plasma     Status: Abnormal  ? Collection Time: 11/17/21  5:43 PM  ?Result Value Ref Range  ? Lactic Acid, Venous 2.9 (HH) 0.5 - 1.9 mmol/L  ?  Comment: CRITICAL RESULT CALLED TO, READ BACK BY AND VERIFIED WITH: ?GARISSON,M @ 1820 ON 11/17/2021 BY LUZOLOP ?Performed at [facility], [address] [place], [place] [address] ?  ?Urinalysis, Routine w reflex microscopic Urine, Catheterized     Status: Abnormal  ? Collection Time: 11/17/21  8:03 PM  ?Result Value Ref Range  ? Color, Urine YELLOW YELLOW  ? APPearance CLEAR CLEAR  ? Specific Gravity, Urine 1.016 1.005 - 1.030  ? pH 6.0 5.0 - 8.0  ? Glucose, UA NEGATIVE NEGATIVE mg/dL  ? Hgb urine dipstick NEGATIVE NEGATIVE  ? Bilirubin Urine NEGATIVE NEGATIVE  ? Ketones, ur NEGATIVE NEGATIVE mg/dL  ? Protein, ur NEGATIVE NEGATIVE mg/dL  ? Nitrite NEGATIVE NEGATIVE  ? Leukocytes,Ua TRACE (A) NEGATIVE  ? RBC / HPF 0-5 0 - 5 RBC/hpf  ? WBC, UA 0-5 0 - 5 WBC/hpf  ? Bacteria, UA NONE SEEN NONE SEEN  ? Squamous Epithelial / LPF 0-5 0 - 5  ?  Comment: Performed at [facility], [address] [place], [place] [address]  ?Lactic acid, plasma     Status: Abnormal  ? Collection Time: 11/17/21  8:58 PM  ?Result Value Ref Range  ? Lactic Acid, Venous 2.5 (HH) 0.5 - 1.9 mmol/L  ?  Comment: CRITICAL RESULT CALLED TO, READ BACK BY AND VERIFIED WITH: ?GRAY,S. 11/17/21  BY SEEL,M. ?Performed at [facility], [address], [place], [place] [address] ?  ?POC occult blood, ED     Status: None  ? Collection Time: 11/17/21 10:04 PM  ?Result Value Ref Range  ? Fecal Occult Bld NEGATIVE NEGATIVE  ?Protime-INR     Status: None  ? Collection Time: 11/18/21  4:34 AM  ?Result Value Ref Range  ?  Prothrombin Time 14.6 11.4 - 15.2 seconds  ? INR 1.2 0.8 - 1.2  ?  Comment: (NOTE) ?INR goal varies based on device and disease states. ?Performed at [facility], [address] ?[place], [place] [id number] ?  ?Procalcitonin     Status: None  ? Collection Time: 11/18/21  4:34 AM  ?Result Value Ref Range  ? Procalcitonin <0.10 ng/mL  ?  Comment:        ?Interpretation: ?PCT (Procalcitonin) <= 0.5 ng/mL: ?Systemic infection (sepsis) is not likely. ?Local bacterial infection is possible. ?(NOTE) ?      Sepsis PCT Algorithm           Lower Respiratory Tract ?                                     Infection PCT Algorithm ?   ----------------------------     ---------------------------- ?        PCT < 0.25 ng/mL                PCT < 0.10 ng/mL ? ?        Strongly encourage             Strongly discourage ?  discontinuation of antibiotics    initiation of antibiotics ?   ----------------------------     ----------------------------- ?      PCT 0.25 - 0.50 ng/mL            PCT 0.10 - 0.25 ng/mL ?              OR ?      >80% decrease in PCT            Discourage initiation of ?                                           antibiotics ?     Encourage discontinuation ?          of antibiotics ?   ----------------------------     ----------------------------- ?        PCT >= 0.50 ng/mL              PCT 0.26 - 0.50 ng/mL ?              AND ?       <80% decrease in PCT             Encourage initiation of ?                                            antibiotics ?      Encourage continuation ?          of antibiotics ?   ----------------------------     ----------------------------- ?       PCT >= 0.50 ng/mL                  PCT > 0.50 ng/mL ?              AND ?        increase in PCT                  Strongly encourage ?  initiation of antibiotics ?   Strongly encourage escalation ?          of antibiotics ?                                    ----------------------------- ?                                           PCT <= 0.25 ng/mL ?                                                OR ?                                       > 80% decrease in PCT ? ?                                    Discontinue /

## 2021-11-19 LAB — CBC
HCT: 23.9 % — ABNORMAL LOW (ref 36.0–46.0)
Hemoglobin: 7.6 g/dL — ABNORMAL LOW (ref 12.0–15.0)
MCH: 30.3 pg (ref 26.0–34.0)
MCHC: 31.8 g/dL (ref 30.0–36.0)
MCV: 95.2 fL (ref 80.0–100.0)
Platelets: 422 10*3/uL — ABNORMAL HIGH (ref 150–400)
RBC: 2.51 MIL/uL — ABNORMAL LOW (ref 3.87–5.11)
RDW: 13.8 % (ref 11.5–15.5)
WBC: 10 10*3/uL (ref 4.0–10.5)
nRBC: 0.2 % (ref 0.0–0.2)

## 2021-11-19 LAB — GLUCOSE, CAPILLARY
Glucose-Capillary: 130 mg/dL — ABNORMAL HIGH (ref 70–99)
Glucose-Capillary: 145 mg/dL — ABNORMAL HIGH (ref 70–99)
Glucose-Capillary: 159 mg/dL — ABNORMAL HIGH (ref 70–99)

## 2021-11-19 LAB — URINE CULTURE: Culture: <10000 — AB

## 2021-11-19 NOTE — Progress Notes (Signed)
PATIENT ID: Veona Bittman  MRN: [id number]  DOB/AGE:  [birth year] / 61 y.o.  ?   ? ?  PROGRESS NOTE ?Subjective: ?Patient is alert, oriented, no Nausea, no Vomiting, yes passing gas, . Taking PO well. ?Denies SOB, Chest or Calf Pain. Using Incentive Spirometer, PAS in place. ?Ambulate WBAT, has been up to commode ?Patient reports pain as  2/10  .   ? ?Objective: ?Vital signs in last 24 hours: ?Vitals:  ? 11/18/21 1243 11/18/21 2104 11/19/21 0448 11/19/21 0807  ?BP: 100/61 (!) 111/54 (!) 121/56   ?Pulse: 96 89 88   ?Resp: 18 17 18    ?Temp: 98.2 ?F (36.8 ?C) 98.4 ?F (36.9 ?C) 98.6 ?F (37 ?C)   ?TempSrc: Oral Oral Oral   ?SpO2: 99% 95% 99% 97%  ?  ?  ?Intake/Output from previous day: ?I/O last 3 completed shifts: ?In: 4599.9 [P.O.:720; I.V.:891.4; IV Piggyback:2988.5] ?Out: 1450 [Urine:1450] ?  ?Intake/Output this shift: ?No intake/output data recorded.  ? ?LABORATORY DATA: ?Recent Labs  ?  11/18/21 ?0434 11/18/21 ?0734 11/18/21 ?1913 11/18/21 ?2100 11/19/21 ?0436 11/19/21 ?11/21/21 11/19/21 ?1143  ?WBC 11.8*  --   --   --  10.0  --   --   ?HGB 7.2*  --   --   --  7.6*  --   --   ?HCT 22.9*  --   --   --  23.9*  --   --   ?PLT 382  --   --   --  422*  --   --   ?NA 139  --  141  --   --   --   --   ?K 3.8  --  3.7  --   --   --   --   ?CL 107  --  111  --   --   --   --   ?CO2 25  --  25  --   --   --   --   ?BUN 10  --  8  --   --   --   --   ?CREATININE 0.62  --  0.67  --   --   --   --   ?GLUCOSE 136*  --  132*  --   --   --   --   ?GLUCAP  --    < >  --  118*  --  130* 145*  ?INR 1.2  --   --   --   --   --   --   ?CALCIUM 8.4*  --  8.6*  --   --   --   --   ? < > = values in this interval not displayed.  ? ? ?Examination: ?Neurologically intact ?ABD soft ?Neurovascular intact ?Sensation intact distally ?Intact pulses distally ?Dorsiflexion/Plantar flexion intact ?Incision: dressing C/D/I ?No cellulitis present ?Compartment soft} ?Dressing is dry, skin along the lateral side of hip has mild induration but no erythema or  increased warmth.  Patient has good active flexion can do a straight leg raise without pain logroll is nontender.  Foot tap is negative. ? ? ?Assessment:   ? 1.  Pneumonia ?2.  At this time I do not believe there is an active infection in the hip itself.  Her white blood cell count has decreased to 10.0 which is normal. ? ? ?Plan: ?Okay to ambulate patient as tolerated with use of a walker for stability.  Will follow up with serial examinations. ? ?  Patient ID: [name], female   DOB: 1961-07-22, 61 y.o.   MRN: [id number] ? ?

## 2021-11-19 NOTE — Progress Notes (Signed)
Patient refused afternoon CBG. Patient has also been refusing scheduled insulin when indicated by sliding scale protocol per [facility]. Dr Butler Denmark notified by this RN; no further orders at this time.  ?

## 2021-11-19 NOTE — Progress Notes (Signed)
?[facility] Progress Note ? ?Patient: [name]     ?JJH:[id number]  ?DOA: 11/17/2021   ?PCP: Georganna Skeans, MD  ? ?  ?  ?Brief hospital course: ?This is a 61 year old female with COPD, depression and anxiety, alcohol abuse in remission, osteoarthritis who is status post left total hip replacement on 4/10. ?She returned to the ED on 4/16 for dizziness & weakness and was found to have a BP of 82/44, lactic acid of 2.9.  ?CT of the chest revealed: Bilateral airspace disease, greatest within the left lower lobe, consistent with multifocal bronchopneumonia. ?Started on Cefepime on Vancomycin. Also suspicion of infection of hip incision. Ortho was consulted. ? ?Subjective:  ?Ongoing cough which is actually worse than yesterday. She has no ambulated except to the chair at the bedside and feels weak.  ? ?Assessment and Plan: ?  ?Principal Problem: ?  Sepsis due to bilateral pneumonia (HCC) ?- dc Vancomycin on 4/17 as MRSA PCR negative ?- cont Cefepime and follow symptoms ?- lactic acid 2.9 > 2.5  ?- per ortho, left hip wound does not appear infected ?- leukocytosis resolved ?- blood cultures NGTD ? ?Active Problems: ? ?Anemia ?- due to acute blood loss related to hip surgery ?- FOB is negative ?- Hgb 10.6 > 8.1 > 7.2> 7.6 ? ?  Osteoarthritis of left hip ?  Status post total hip replacement, left ?- cont PT ? ?  Generalized anxiety disorder ?- cont Buspar, Seroquel, PRN Hydroxyzine and Zoloft ? ?  Type 2 diabetes mellitus (HCC) ?- continue Novolog sliding scale ?- Hemoglobin A1C ?   ?Component Value Date/Time  ? HGBA1C 6.1 (H) 11/17/2021 1614  ?  ? ?HTN ?- cont Losartan and Amlodipine ? ?COPD ?- no exacerbation noted at this time ?- cont Dulera ? ?  Liver cirrhosis (HCC) ? Alcohol use disorder, severe, in sustained remission (HCC) ? ? ? ? ?DVT prophylaxis:  enoxaparin (LOVENOX) injection 40 mg Start: 11/18/21 1000 ? ?  Code Status: Full Code  ?Level of Care: Level of care: Med-Surg ?Disposition Plan:  ?Status is:  Inpatient ?Remains inpatient appropriate because: treating pneumonia ? ?Objective: ?  ?Vitals:  ? 11/18/21 2104 11/19/21 0448 11/19/21 0807 11/19/21 1318  ?BP: (!) 111/54 (!) 121/56  (!) 139/55  ?Pulse: 89 88  (!) 103  ?Resp: 17 18  20   ?Temp: 98.4 ?F (36.9 ?C) 98.6 ?F (37 ?C)  99.3 ?F (37.4 ?C)  ?TempSrc: Oral Oral  Oral  ?SpO2: 95% 99% 97% 93%  ? ?There were no vitals filed for this visit. ?Exam: ?General exam: Appears comfortable  ?HEENT: PERRLA, oral mucosa moist, no sclera icterus or thrush ?Respiratory system: congested cough, rhonchi in b/l lung fields ?Cardiovascular system: S1 & S2 heard, regular rate and rhythm ?Gastrointestinal system: Abdomen soft, non-tender, nondistended. Normal bowel sounds   ?Central nervous system: Alert and oriented. No focal neurological deficits. ?Extremities: No cyanosis, clubbing or edema ?Skin: No rashes or ulcers ?Psychiatry:  Mood & affect appropriate.   ? ?Imaging and lab data was personally reviewed ? ? ? CBC: ?Recent Labs  ?Lab 11/13/21 ?0346 11/14/21 ?11/16/21 11/17/21 ?1614 11/18/21 ?0434 11/19/21 ?0436  ?WBC 18.0* 12.5* 13.7* 11.8* 10.0  ?NEUTROABS  --   --  8.9*  --   --   ?HGB 9.1* 8.1* 7.5* 7.2* 7.6*  ?HCT 28.3* 24.4* 22.8* 22.9* 23.9*  ?MCV 93.1 91.0 93.1 94.6 95.2  ?PLT 258 252 384 382 422*  ? ?Basic Metabolic Panel: ?Recent Labs  ?Lab 11/14/21 ?11/16/21 11/17/21 ?  1614 11/18/21 ?0434 11/18/21 ?1913  ?NA 136 134* 139 141  ?K 4.1 3.5 3.8 3.7  ?CL 100 101 107 111  ?CO2 30 25 25 25   ?GLUCOSE 154* 121* 136* 132*  ?BUN 14 15 10 8   ?CREATININE 0.65 0.92 0.62 0.67  ?CALCIUM 8.9 8.1* 8.4* 8.6*  ? ?GFR: ?Estimated Creatinine Clearance: 85.6 mL/min (by C-G formula based on SCr of 0.67 mg/dL). ? ?Scheduled Meds: ? amLODipine  10 mg Oral Daily  ? aspirin EC  81 mg Oral BID  ? atorvastatin  40 mg Oral Daily  ? busPIRone  10 mg Oral TID  ? enoxaparin (LOVENOX) injection  40 mg Subcutaneous Q24H  ? folic acid  1 mg Oral Daily  ? gabapentin  300 mg Oral TID  ? insulin aspart  0-20 Units  Subcutaneous TID WC  ? insulin aspart  0-5 Units Subcutaneous QHS  ? losartan  100 mg Oral Daily  ? mometasone-formoterol  2 puff Inhalation BID  ? multivitamin with minerals  1 tablet Oral Daily  ? oxybutynin  15 mg Oral Daily  ? pantoprazole  40 mg Oral BID  ? QUEtiapine  300 mg Oral QHS  ? sertraline  200 mg Oral Daily  ? ?Continuous Infusions: ? ceFEPime (MAXIPIME) IV 2 g (11/19/21 0955)  ? ? ? LOS: 2 days  ? ?Author: ?  ?11/19/2021 3:51 PM ?   ?

## 2021-11-20 MED ORDER — BENZONATATE 100 MG PO CAPS: 100.0000 mg | ORAL_CAPSULE | Freq: Two times a day (BID) | ORAL | 0 refills | Status: DC | PRN

## 2021-11-20 MED ORDER — CEFDINIR 300 MG PO CAPS: 300.0000 mg | ORAL_CAPSULE | Freq: Two times a day (BID) | ORAL | 0 refills | Status: AC

## 2021-11-20 MED ORDER — GUAIFENESIN ER 600 MG PO TB12: 600.0000 mg | ORAL_TABLET | Freq: Two times a day (BID) | ORAL | 2 refills | Status: AC

## 2021-11-20 NOTE — Plan of Care (Signed)
  Problem: Clinical Measurements: Goal: Cardiovascular complication will be avoided Outcome: Progressing   Problem: Activity: Goal: Risk for activity intolerance will decrease Outcome: Progressing   Problem: Safety: Goal: Ability to remain free from injury will improve Outcome: Progressing   

## 2021-11-20 NOTE — Progress Notes (Signed)
PATIENT ID: [name]  MRN: [id number]  DOB/AGE:  [birth year] / 61 y.o.  ?   ? ?  PROGRESS NOTE ?Subjective: ?Patient is alert, oriented, no Nausea, no Vomiting, yes passing gas, . Taking PO well. ?Denies SOB, Chest or Calf Pain. Using [employer],  ?Ambulate WBAT, with pt up in recliner. ?Patient reports pain as mild .   ? ?Objective: ?Vital signs in last 24 hours: ?Vitals:  ? 11/19/21 0807 11/19/21 1318 11/19/21 2138 11/20/21 0547  ?BP:  (!) 139/55 (!) 136/53 110/64  ?Pulse:  (!) 103 96 89  ?Resp:  20 18 18   ?Temp:  99.3 ?F (37.4 ?C) 99.6 ?F (37.6 ?C) 98.2 ?F (36.8 ?C)  ?TempSrc:  Oral Oral Oral  ?SpO2: 97% 93% 93% 95%  ?  ?  ?Intake/Output from previous day: ?I/O last 3 completed shifts: ?In: 640 [P.O.:240; IV Piggyback:400] ?Out: 600 [Urine:600] ?  ?Intake/Output this shift: ?No intake/output data recorded.  ? ?LABORATORY DATA: ?Recent Labs  ?  11/18/21 ?0434 11/18/21 ?0734 11/18/21 ?1913 11/18/21 ?2100 11/19/21 ?0436 11/19/21 ?0743 11/19/21 ?1143 11/19/21 ?2136  ?WBC 11.8*  --   --   --  10.0  --   --   --   ?HGB 7.2*  --   --   --  7.6*  --   --   --   ?HCT 22.9*  --   --   --  23.9*  --   --   --   ?PLT 382  --   --   --  422*  --   --   --   ?NA 139  --  141  --   --   --   --   --   ?K 3.8  --  3.7  --   --   --   --   --   ?CL 107  --  111  --   --   --   --   --   ?CO2 25  --  25  --   --   --   --   --   ?BUN 10  --  8  --   --   --   --   --   ?CREATININE 0.62  --  0.67  --   --   --   --   --   ?GLUCOSE 136*  --  132*  --   --   --   --   --   ?GLUCAP  --    < >  --    < >  --  130* 145* 159*  ?INR 1.2  --   --   --   --   --   --   --   ?CALCIUM 8.4*  --  8.6*  --   --   --   --   --   ? < > = values in this interval not displayed.  ? ? ?Examination: ?Neurologically intact ?Neurovascular intact ?Sensation intact distally ?Intact pulses distally ?Dorsiflexion/Plantar flexion intact ?Incision: dressing C/D/I and no drainage} ?No pain with active ROM of the HIP.  No erythema or signs of  cellulitis. ? ?Assessment:   ?1.  Pneumonia ?2.  At this time I do not believe there is an active infection in the hip itself.  Her white blood cell count has decreased to 10.0 which is normal. ? ?Plan: ?WBAT with therapy and use of walker for stability. ?Keep follow up appt  in office in 5-7 days. ? ?

## 2021-11-21 ENCOUNTER — Telehealth: Payer: Self-pay

## 2021-11-21 NOTE — Telephone Encounter (Signed)
Transition Care Management Follow-up Telephone Call ?Date of discharge and from where: 11/20/2021,  [facility]  ?How have you been since you were released from the hospital? She stated that she is doing okay. ?Any questions or concerns? Yes - she said she has a cyst on the back of her neck that developed since she left the hospital.  She wants a provider to assess it as soon as possible and would like it removed. I told her that the provider will need to assess to determine the plan for treatment. I scheduled her with Maurene Capes, PA on 11/25/2021.  ? ?Items Reviewed: ?Did the pt receive and understand the discharge instructions provided? Yes  ?Medications obtained and verified? Yes  - she said she has all of her medications except the antibiotic that her mother will pick up today. She did not have any questions about the med regime. ?Other? No  ?Any new allergies since your discharge? No  ?Dietary orders reviewed? Yes ?Do you have support at home? Yes  - she lives with her parents.  ? ?Home Care and Equipment/Supplies: ?Were home health services ordered? no ?If so, what is the name of the agency? N/a  ?Has the agency set up a time to come to the patient's home? not applicable ?Were any new equipment or medical supplies ordered?  No ?What is the name of the medical supply agency? N/a ?Were you able to get the supplies/equipment? not applicable ?Do you have any questions related to the use of the equipment or supplies? No ? ?She has home O2 and uses it @ 4L /min at night.  ? ?Functional Questionnaire: (I = Independent and D = Dependent) ?ADLs: independent with personal care. When asked if she is ambulating with her walker she stated that she is using a cane.  WBAT LLE ? ?Follow up appointments reviewed: ? ?PCP Hospital f/u appt confirmed? Yes  Scheduled to see Maurene Capes, PA on 11/25/2021.  ?Specialist Hospital f/u appt confirmed?  She is not sure and will need to check    ?Are transportation arrangements  needed? No  ?If their condition worsens, is the pt aware to call PCP or go to the Emergency Dept.? Yes ?Was the patient provided with contact information for the PCP's office or ED? Yes ?Was to pt encouraged to call back with questions or concerns? Yes ? ?

## 2021-11-22 LAB — CULTURE, BLOOD (ROUTINE X 2)
Culture: NO GROWTH
Culture: NO GROWTH
Special Requests: ADEQUATE

## 2021-11-23 NOTE — Discharge Summary (Signed)
?Physician Discharge Summary ?  ?Patient: [name] MRN: [id number] DOB: 1961/03/20  ?Admit date:     11/17/2021  ?Discharge date: 11/20/2021  ?Discharge Physician: Berle Mull  ?PCP: Dorna Mai, MD ? ?Recommendations at discharge: ?Follow-up with PCP as recommended. ? ?Discharge Diagnoses: ?Principal Problem: ?  Sepsis due to pneumonia [facility]) ?Active Problems: ?  Major depressive disorder, recurrent episode, moderate with anxious distress ([place]) ?  Liver cirrhosis ([place]) ?  Alcohol use disorder, severe, in sustained remission ([place]) ?  Generalized anxiety disorder ?  Type 2 diabetes mellitus ([place]) ?  HCAP (healthcare-associated pneumonia) ?  Osteoarthritis of left hip ?  Status post total hip replacement, left ? ? ?Hospital Course: ?This is a 61 year old female with COPD, depression and anxiety, alcohol abuse in remission, osteoarthritis who is status post left total hip replacement on 4/10. ?She returned to the ED on 4/16 for dizziness & weakness and was found to have a BP of 82/44, lactic acid of 2.9.  ?CT of the chest revealed: Bilateral airspace disease, greatest within the left lower lobe, consistent with multifocal bronchopneumonia. ?Started on Cefepime on Vancomycin. Also suspicion of infection of hip incision. Ortho was consulted. ? ?Assessment and Plan: ?  Sepsis due to bilateral pneumonia ([place]) ?Met SIRS criteria on admission. ?Treated with broad-spectrum antibiotics. ?Blood cultures so far negative. ?There are some concern for hip pain orthopedic was consulted. ?Per Ortho patient does not appear to have any joint infection or wound infection. ?Continue with oral antibiotic on discharge. ?Patient able to ambulate without any issues. ?  ?Anemia ?- due to acute blood loss related to hip surgery ?- FOB is negative ?Hemoglobin remaining stable. ?Outpatient follow-up with PCP. ? ?  Osteoarthritis of left hip ?  Status post total hip replacement, left ?  ?  Generalized anxiety disorder ?- cont Buspar,  Seroquel, PRN Hydroxyzine and Zoloft ?  ?  Type 2 diabetes mellitus (HCC) ?- continue Novolog sliding scale ? ?HTN ?- cont Losartan and Amlodipine ?  ?COPD ?- no exacerbation noted at this time ?- cont Dulera ?  ?  Liver cirrhosis ([place]) ? Alcohol use disorder, severe, in sustained remission ([place]) ? ?Consultants: Orthopedics ?Procedures performed:  ?None ?DISCHARGE MEDICATION: ?Allergies as of 11/20/2021   ? ?   Reactions  ? Amoxicillin   ? Yeast infection ; tolerated Cefepime  ? ?  ? ?  ?Medication List  ?  ? ?STOP taking these medications   ? ?doxycycline 100 MG capsule ?Commonly known as: VIBRAMYCIN ?  ? ?  ? ?TAKE these medications   ? ?amLODipine 10 MG tablet ?Commonly known as: NORVASC ?TAKE 1 TABLET(10 MG) BY MOUTH DAILY ?What changed:  ?how much to take ?how to take this ?when to take this ?additional instructions ?  ?aspirin EC 81 MG tablet ?Take 1 tablet (81 mg total) by mouth 2 (two) times daily. ?  ?atorvastatin 40 MG tablet ?Commonly known as: LIPITOR ?Take 1 tablet (40 mg total) by mouth daily. ?  ?benzonatate 100 MG capsule ?Commonly known as: TESSALON ?Take 1 capsule (100 mg total) by mouth 2 (two) times daily as needed for cough. ?  ?Breo Ellipta 100-25 MCG/ACT Aepb ?Generic drug: fluticasone furoate-vilanterol ?Inhale 1 puff into the lungs daily. ?  ?budesonide-formoterol 160-4.5 MCG/ACT inhaler ?Commonly known as: SYMBICORT ?INHALE 2 PUFFS INTO THE LUNGS TWICE DAILY ?  ?busPIRone 10 MG tablet ?Commonly known as: BUSPAR ?Take 1 tablet (10 mg total) by mouth 3 (three) times daily. ?  ?cefdinir 300 MG  capsule ?Commonly known as: OMNICEF ?Take 1 capsule (300 mg total) by mouth 2 (two) times daily for 5 days. ?  ?folic acid 1 MG tablet ?Commonly known as: FOLVITE ?TAKE 1 TABLET(1 MG) BY MOUTH DAILY ?What changed: See the new instructions. ?  ?gabapentin 300 MG capsule ?Commonly known as: NEURONTIN ?TAKE 1 CAPSULE(300 MG) BY MOUTH THREE TIMES DAILY ?What changed: See the new instructions. ?  ?guaiFENesin  600 MG 12 hr tablet ?Commonly known as: Mucinex ?Take 1 tablet (600 mg total) by mouth 2 (two) times daily. ?  ?hydrOXYzine 50 MG tablet ?Commonly known as: ATARAX ?TAKE 1 TABLET (50 MG TOTAL) BY MOUTH 3 (THREE) TIMES DAILY AS NEEDED FOR ANXIETY. ?What changed:  ?how much to take ?how to take this ?when to take this ?reasons to take this ?  ?ibuprofen 200 MG tablet ?Commonly known as: ADVIL ?Take 600 mg by mouth daily as needed for moderate pain. ?  ?Icy Hot 5 % Ptch ?Generic drug: Menthol ?Apply 1 patch topically daily as needed (hip pain). ?  ?losartan 100 MG tablet ?Commonly known as: COZAAR ?Take 1 tablet (100 mg total) by mouth daily. ?  ?multivitamin with minerals Tabs tablet ?Take 1 tablet by mouth daily. ?  ?oxybutynin 15 MG 24 hr tablet ?Commonly known as: DITROPAN XL ?TAKE 1 TABLET(15 MG) BY MOUTH AT BEDTIME ?What changed: See the new instructions. ?  ?OXYGEN ?Inhale 4 L into the lungs at bedtime. ?  ?pantoprazole 40 MG tablet ?Commonly known as: PROTONIX ?TAKE 1 TABLET(40 MG) BY MOUTH TWICE DAILY ?What changed: See the new instructions. ?  ?POTASSIUM PO ?Take 1 tablet by mouth daily. ?  ?QUEtiapine 300 MG tablet ?Commonly known as: SEROQUEL ?TAKE 1 TABLET(300 MG) BY MOUTH AT BEDTIME ?What changed:  ?how much to take ?how to take this ?when to take this ?additional instructions ?  ?sertraline 100 MG tablet ?Commonly known as: ZOLOFT ?Take 2 tablets (200 mg total) by mouth daily. ?  ?tizanidine 2 MG capsule ?Commonly known as: Zanaflex ?Take 2 capsules (4 mg total) by mouth 3 (three) times daily as needed for muscle spasms. ?  ?traMADol 50 MG tablet ?Commonly known as: ULTRAM ?Take 50 mg by mouth every 6 (six) hours as needed for severe pain. ?  ?VITAMIN D PO ?Take 1 capsule by mouth daily. ?  ? ?  ? ? Follow-up Information   ? ? Dorna Mai, MD. Schedule an appointment as soon as possible for a visit in 1 week(s).   ?Specialty: Family Medicine ?Contact information: ?[address], [address] ?[place] [address] ?[phone] ? ? ?  ?  ? ? Frederik Pear, MD Follow up.   ?Specialty: Orthopedic Surgery ?Contact information: ?[address] ?[place] [address] ?[phone] ? ? ?  ?  ? ?  ?  ? ?  ? ?Disposition: Home ?Diet recommendation: Cardiac diet ? ?Discharge Exam: ?Vitals:  ? 11/19/21 1318 11/19/21 2138 11/20/21 0547 11/20/21 0826  ?BP: (!) 139/55 (!) 136/53 110/64   ?Pulse: (!) 103 96 89   ?Resp: 20 18 18    ?Temp: 99.3 ?F (37.4 ?C) 99.6 ?F (37.6 ?C) 98.2 ?F (36.8 ?C)   ?TempSrc: Oral Oral Oral   ?SpO2: 93% 93% 95% 96%  ? ?General: Appear in no distress; no visible Abnormal Neck Mass Or lumps, Conjunctiva normal ?Cardiovascular: S1 and S2 Present, no Murmur, ?Respiratory: good respiratory effort, Bilateral Air entry present and CTA, no Crackles, no wheezes ?Abdomen: Bowel Sound present, Non tender ?Extremities: no  Pedal edema ?Neurology: alert and oriented to time, place, and person ?Gait not checked due to patient safety concerns ?There were no vitals filed for this visit. ?Condition at discharge: stable ? ?The results of significant diagnostics from this hospitalization (including imaging, microbiology, ancillary and laboratory) are listed below for reference.  ? ?Imaging Studies: ?DG Chest 2 View ? ?Result Date: 11/01/2021 ?CLINICAL DATA:  Chest congestion EXAM: CHEST - 2 VIEW COMPARISON:  12/31/2020 FINDINGS: Mild left basilar atelectasis or scarring. Lungs are otherwise clear. No pneumothorax or pleural effusion. Cardiac size within normal limits. No acute bone abnormality. IMPRESSION: No radiographic evidence of acute cardiopulmonary disease. Electronically Signed   By: Fidela Salisbury M.D.   On: 11/01/2021 23:45  ? ?CT ABDOMEN PELVIS W CONTRAST ? ?Result Date: 11/17/2021 ?CLINICAL DATA:  Sepsis, hypotension EXAM: CT ABDOMEN AND PELVIS WITH CONTRAST TECHNIQUE: Multidetector CT imaging of the abdomen and pelvis was performed using the standard protocol following bolus administration of  intravenous contrast. RADIATION DOSE REDUCTION: This exam was performed according to the departmental dose-optimization program which includes automated exposure control, adjustment of the mA and/or kV according to patient size

## 2021-11-25 ENCOUNTER — Ambulatory Visit: Payer: Medicaid Other | Admitting: Physician Assistant

## 2021-11-27 ENCOUNTER — Ambulatory Visit
Admission: EM | Admit: 2021-11-27 | Discharge: 2021-11-27 | Disposition: A | Discharge status: Other Acute Inpatient Hospital | Payer: Medicaid Other

## 2021-11-27 ENCOUNTER — Encounter: Payer: Self-pay | Admitting: Emergency Medicine

## 2021-11-27 ENCOUNTER — Emergency Department (HOSPITAL_COMMUNITY): Payer: Medicaid Other

## 2021-11-27 ENCOUNTER — Other Ambulatory Visit: Payer: Self-pay

## 2021-11-27 ENCOUNTER — Other Ambulatory Visit: Payer: Self-pay | Admitting: Family Medicine

## 2021-11-27 ENCOUNTER — Emergency Department (HOSPITAL_COMMUNITY)
Admission: EM | Admit: 2021-11-27 | Discharge: 2021-11-27 | Disposition: A | Discharge status: Home / Self Care | Payer: Medicaid Other | Attending: Emergency Medicine | Admitting: Emergency Medicine

## 2021-11-27 ENCOUNTER — Encounter (HOSPITAL_COMMUNITY): Payer: Self-pay

## 2021-11-27 DIAGNOSIS — D72829 Elevated white blood cell count, unspecified: Secondary | ICD-10-CM | POA: Diagnosis not present

## 2021-11-27 DIAGNOSIS — R82998 Other abnormal findings in urine: Secondary | ICD-10-CM | POA: Diagnosis not present

## 2021-11-27 DIAGNOSIS — M25552 Pain in left hip: Secondary | ICD-10-CM | POA: Insufficient documentation

## 2021-11-27 DIAGNOSIS — R2243 Localized swelling, mass and lump, lower limb, bilateral: Secondary | ICD-10-CM

## 2021-11-27 DIAGNOSIS — Z96643 Presence of artificial hip joint, bilateral: Secondary | ICD-10-CM | POA: Insufficient documentation

## 2021-11-27 DIAGNOSIS — R7981 Abnormal blood-gas level: Secondary | ICD-10-CM | POA: Diagnosis not present

## 2021-11-27 DIAGNOSIS — R0602 Shortness of breath: Secondary | ICD-10-CM

## 2021-11-27 DIAGNOSIS — Z20822 Contact with and (suspected) exposure to covid-19: Secondary | ICD-10-CM | POA: Insufficient documentation

## 2021-11-27 DIAGNOSIS — G629 Polyneuropathy, unspecified: Secondary | ICD-10-CM

## 2021-11-27 LAB — COMPREHENSIVE METABOLIC PANEL
ALT: 19 U/L (ref 0–44)
AST: 27 U/L (ref 15–41)
Albumin: 3.5 g/dL (ref 3.5–5.0)
Alkaline Phosphatase: 133 U/L — ABNORMAL HIGH (ref 38–126)
Anion gap: 13 (ref 5–15)
BUN: <5 mg/dL — ABNORMAL LOW (ref 8–23)
CO2: 24 mmol/L (ref 22–32)
Calcium: 9.1 mg/dL (ref 8.9–10.3)
Chloride: 103 mmol/L (ref 98–111)
Creatinine, Ser: 0.74 mg/dL (ref 0.44–1.00)
GFR, Estimated: >60 mL/min (ref >60)
Glucose, Bld: 133 mg/dL — ABNORMAL HIGH (ref 70–99)
Potassium: 3.8 mmol/L (ref 3.5–5.1)
Sodium: 140 mmol/L (ref 135–145)
Total Bilirubin: 0.4 mg/dL (ref 0.3–1.2)
Total Protein: 6.8 g/dL (ref 6.5–8.1)

## 2021-11-27 LAB — CBC WITH DIFFERENTIAL/PLATELET
Abs Immature Granulocytes: 0.07 10*3/uL (ref 0.00–0.07)
Basophils Absolute: 0.2 10*3/uL — ABNORMAL HIGH (ref 0.0–0.1)
Basophils Relative: 1 %
Eosinophils Absolute: 0.6 10*3/uL — ABNORMAL HIGH (ref 0.0–0.5)
Eosinophils Relative: 4 %
HCT: 31.4 % — ABNORMAL LOW (ref 36.0–46.0)
Hemoglobin: 9.6 g/dL — ABNORMAL LOW (ref 12.0–15.0)
Immature Granulocytes: 1 %
Lymphocytes Relative: 13 %
Lymphs Abs: 1.8 10*3/uL (ref 0.7–4.0)
MCH: 28.8 pg (ref 26.0–34.0)
MCHC: 30.6 g/dL (ref 30.0–36.0)
MCV: 94.3 fL (ref 80.0–100.0)
Monocytes Absolute: 1.1 10*3/uL — ABNORMAL HIGH (ref 0.1–1.0)
Monocytes Relative: 8 %
Neutro Abs: 10.5 10*3/uL — ABNORMAL HIGH (ref 1.7–7.7)
Neutrophils Relative %: 73 %
Platelets: 810 10*3/uL — ABNORMAL HIGH (ref 150–400)
RBC: 3.33 MIL/uL — ABNORMAL LOW (ref 3.87–5.11)
RDW: 13.8 % (ref 11.5–15.5)
WBC: 14.2 10*3/uL — ABNORMAL HIGH (ref 4.0–10.5)
nRBC: 0 % (ref 0.0–0.2)

## 2021-11-27 LAB — URINALYSIS, ROUTINE W REFLEX MICROSCOPIC
Bilirubin Urine: NEGATIVE
Glucose, UA: NEGATIVE mg/dL
Hgb urine dipstick: NEGATIVE
Ketones, ur: NEGATIVE mg/dL
Nitrite: NEGATIVE
Protein, ur: NEGATIVE mg/dL
Specific Gravity, Urine: 1.016 (ref 1.005–1.030)
pH: 7 (ref 5.0–8.0)

## 2021-11-27 LAB — RESP PANEL BY RT-PCR (FLU A&B, COVID) ARPGX2
Influenza A by PCR: NEGATIVE
Influenza B by PCR: NEGATIVE
SARS Coronavirus 2 by RT PCR: NEGATIVE

## 2021-11-27 LAB — LACTIC ACID, PLASMA: Lactic Acid, Venous: 2.4 mmol/L (ref 0.5–1.9)

## 2021-11-27 IMAGING — CR DG CHEST 1V
1 series · 1 of 1 positions shown · non-contrast
Comparison: AP chest [DATE]

CLINICAL DATA: Shortness of breath.

EXAM:
CHEST  1 VIEW

[chest pa]
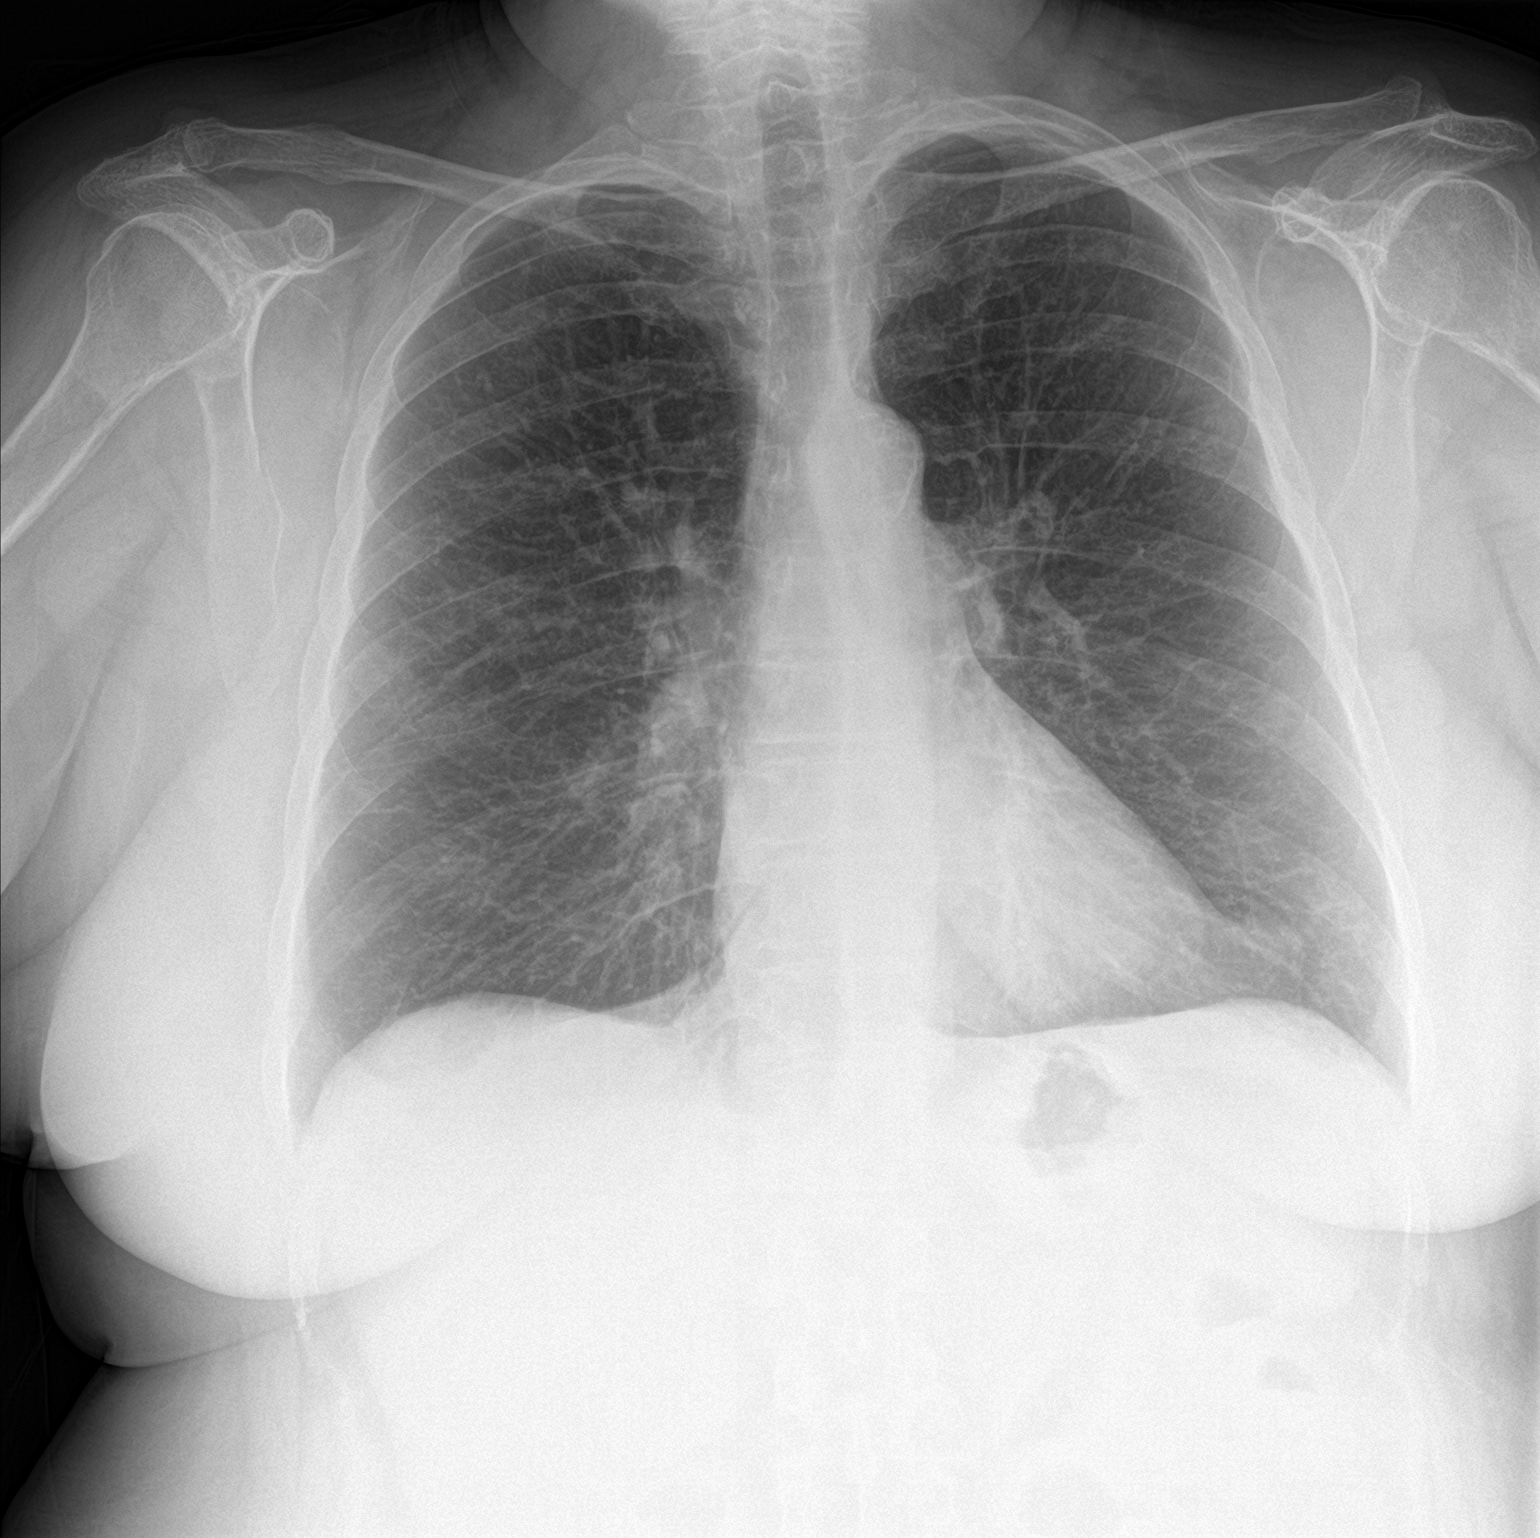

[1 of 1 positions shown; findings below may reference images not displayed]

FINDINGS: Cardiac silhouette and mediastinal contours are within normal
limits. Mild calcification within the aortic arch. The lungs are
clear. No pleural effusion or pneumothorax. No acute skeletal
abnormality.
IMPRESSION: No active disease.

## 2021-11-27 IMAGING — CR DG HIP (WITH OR WITHOUT PELVIS) 2-3V*L*
3 series · 4 of 4 positions shown · non-contrast
Comparison: Left femur radiographs [DATE], CT left femur
[DATE]; pelvis and right hip radiographs [DATE]

CLINICAL DATA: Status post recent total left hip arthroplasty.
Concern for infection.

EXAM:
DG HIP (WITH OR WITHOUT PELVIS) 2-3V LEFT

[Series 1: pelvis ap · 0.14mm/px · 2 of 2 slices shown]
[im 1/2]
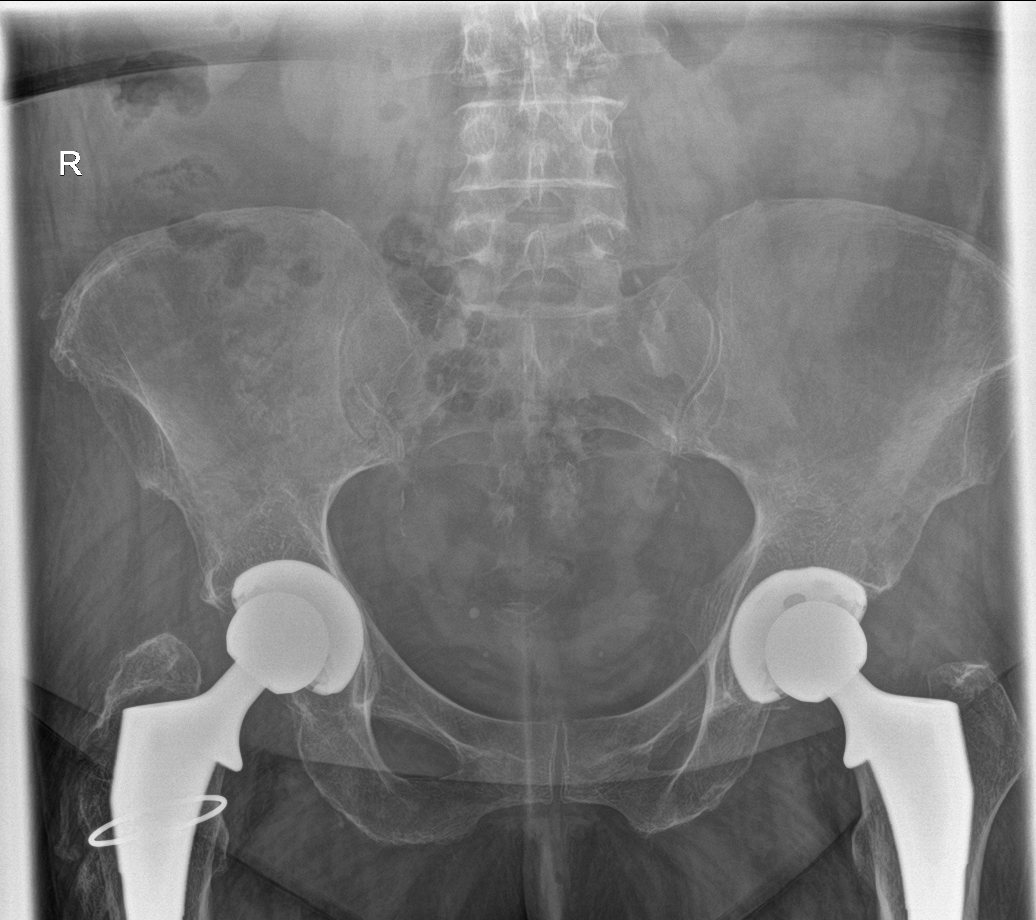
[im 2/2]
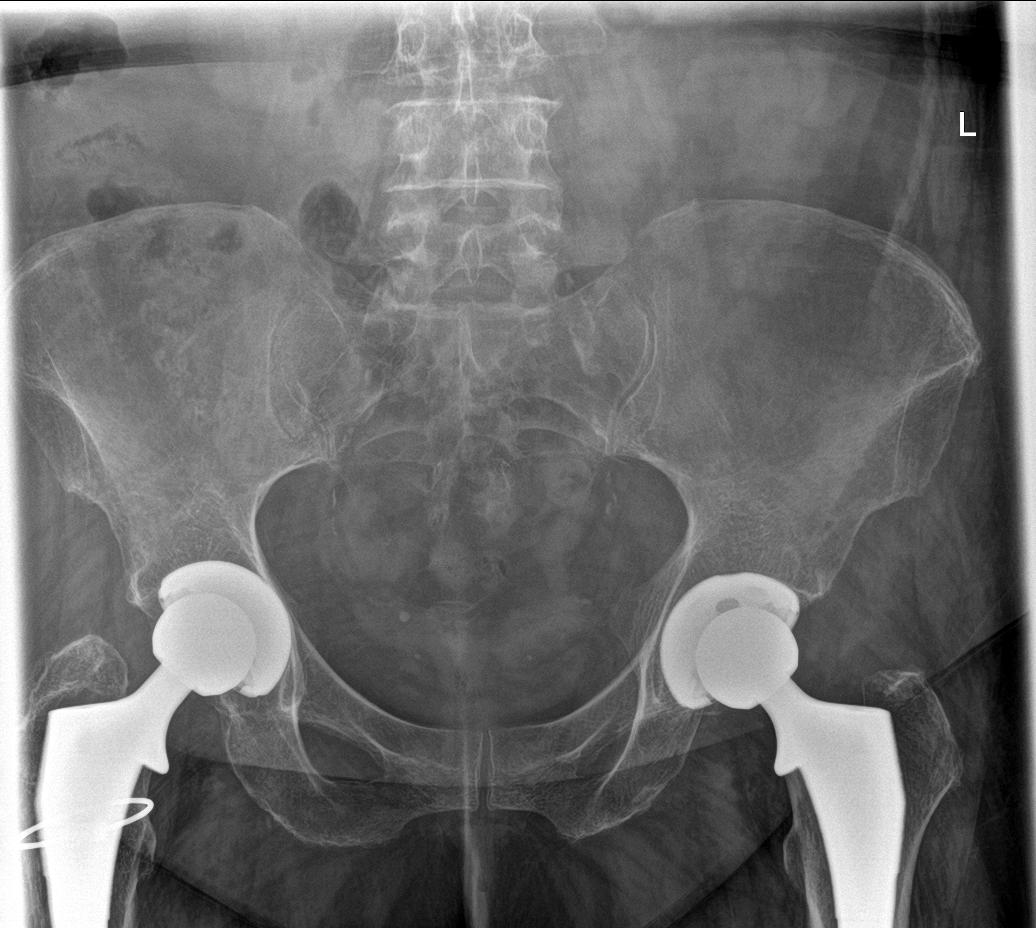

[hip ap]
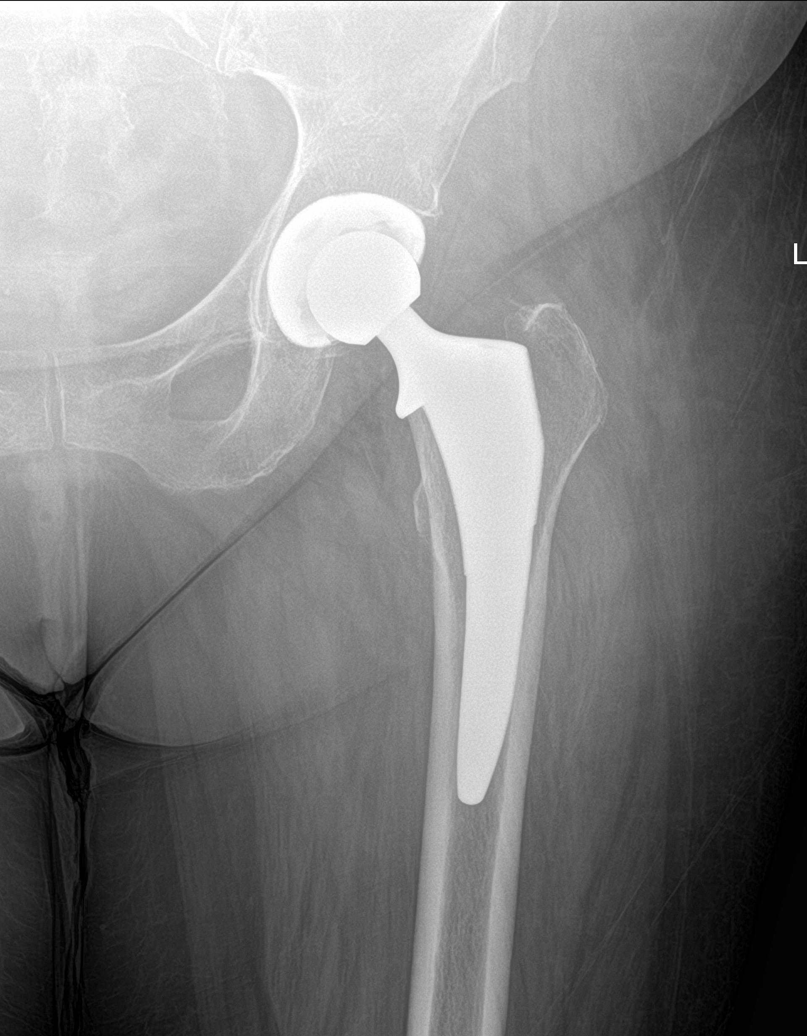

[hip lat]
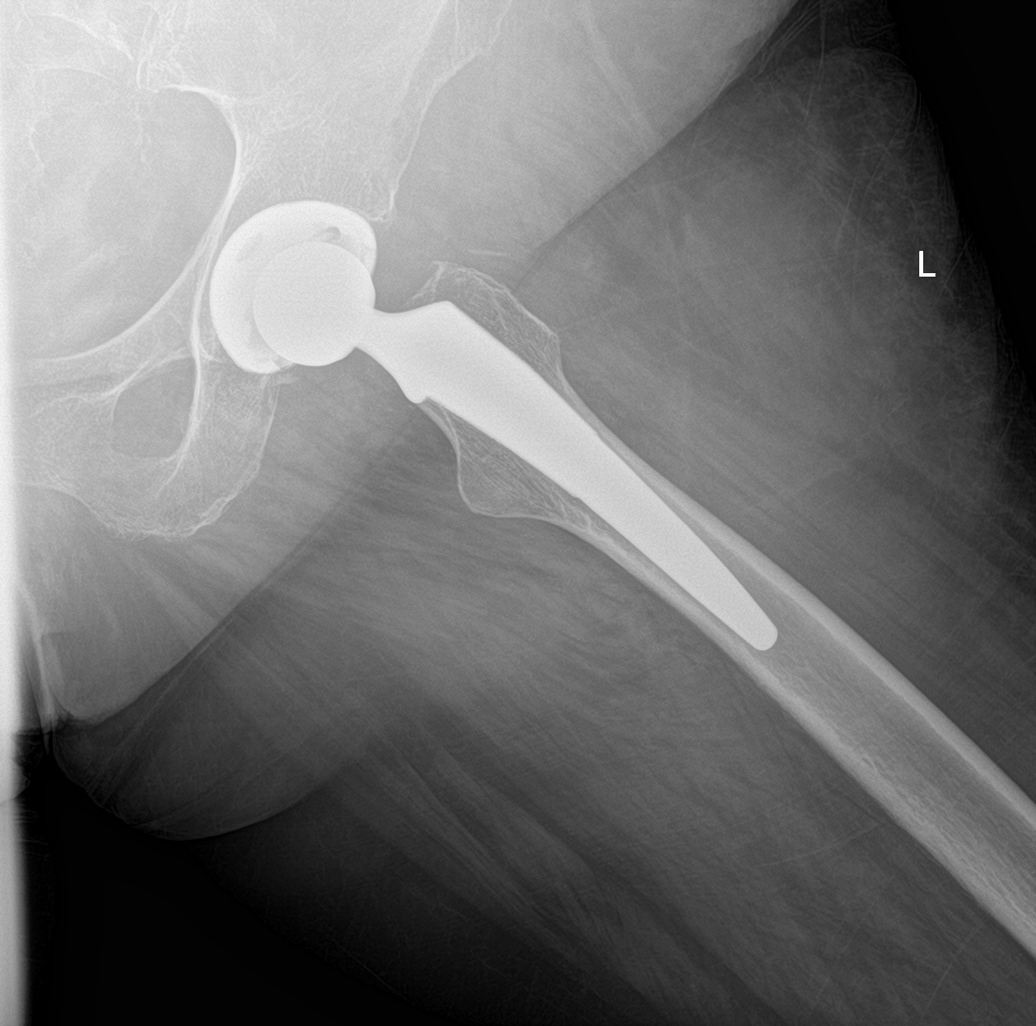

[4 of 4 positions shown; findings below may reference images not displayed]

FINDINGS: Status post bilateral total hip arthroplasty. No perihardware
lucency is seen around either hip arthroplasty hardware.

There is separation of the superior aspect of the right greater
trochanter, chronic after prior arthroplasty surgery. There is mild
lucency/erosion at the inferior aspect of this bone where it abuts
the superior aspect of the right femoral prosthesis "greater
trochanteric" region likely from chronic abutment.

The bilateral sacroiliac joint spaces are maintained.

No acute fracture is seen.  No dislocation.
IMPRESSION: Status post bilateral total hip arthroplasty. With attention to the
left hip, no perihardware lucency is seen to indicate hardware
loosening or infection. No definite osteolysis is identified.

## 2021-11-27 MED ORDER — ACETAMINOPHEN 325 MG PO TABS
650.0000 mg | ORAL_TABLET | Freq: Once | ORAL | Status: AC
  Administered 2021-11-27: 650 mg via ORAL
  Filled 2021-11-27: qty 2

## 2021-11-27 NOTE — ED Triage Notes (Signed)
Patient had hip surgery on 4/10 it got infected and she is concerned that infection due to pain and swelling. ? ?Also had PNA on 4/16 complaining of sob and decreased lung sounds per EMS.  Patient only sleeps with oxxygen.  ? ?Sats RA 91% and placed on 2L [place] ? ?BP 178/82 HR 94 afebrile ?

## 2021-11-27 NOTE — Discharge Instructions (Signed)
Go to Dr. Wadie Lessen office at 830 tomorrow morning for further evaluation and treatment.  Keep your legs elevated above your heart to decrease swelling in your feet.  Use Tylenol, 650 mg, every 4 hours as needed for pain. ?

## 2021-11-27 NOTE — ED Notes (Signed)
EDP at [facility] 

## 2021-11-27 NOTE — ED Provider Notes (Signed)
?[facility] ? ? ? ?CSN: [id number] ?Arrival date & time: 11/27/21  1208 ? ? ?  ? ?History   ?Chief Complaint ?Chief Complaint  ?Patient presents with  ? Bilateral feet and leg swelling  ? ? ?HPI ?[name] is a 61 y.o. female.  ? ?Patient presents with bilateral foot and lower extremity swelling that has been present for approximately 1 week.  She reports associated redness and pain as well.  Patient also reports that she been having some diarrhea that is nonbloody over the past few days with associated stomachache.  She is also concerned that she still has "infection in her chest" and is requesting an antibiotic to treat this.  Patient has shortness of breath but reports that she has shortness of breath at baseline and this is not any different.  Patient has history of COPD and wears 4 L of oxygen at night.  She does not check her oxygen saturation on a normal basis.  Patient was recently hospitalized on 11/17/2021 for multifocal bronchopneumonia as well as possible surgical site infection to left hip replacement that was completed on 11/11/2021.  Patient reports that she is finished all of the medications that were prescribed at discharge but still feels like there is "infection in her chest".  Denies any associated chest pain, fever, body aches, chills. ? ? ? ?Past Medical History:  ?Diagnosis Date  ? Anxiety   ? Arthritis   ? COPD (chronic obstructive pulmonary disease) ([place])   ? uses oxygen 4L at nite  ? Depression   ? Dyspnea   ? GERD (gastroesophageal reflux disease)   ? Hypertension   ? Peripheral edema   ? 2+ at PAT visit  ? ? ?Patient Active Problem List  ? Diagnosis Date Noted  ? Sepsis due to pneumonia ([place]) 11/17/2021  ? Status post hip replacement, left 11/12/2021  ? Status post total hip replacement, left 11/11/2021  ? Osteoarthritis of left hip 11/06/2021  ? Hypoxia   ? Hypokalemia   ? Wheezing   ? HCAP (healthcare-associated pneumonia) 12/31/2020  ? History of total hip arthroplasty,  right 12/24/2020  ? Type 2 diabetes mellitus ([place]) 12/18/2020  ? Avascular necrosis of bone of right hip ([place]) 09/17/2020  ? Greater trochanteric pain syndrome of left lower extremity 07/24/2020  ? Rheumatoid factor positive 06/26/2020  ? CRP elevated 06/26/2020  ? Bilateral hand pain 06/26/2020  ? Stasis dermatitis of both legs 06/26/2020  ? Alcohol use disorder, severe, in sustained remission ([place]) 04/30/2020  ? Chronic sinusitis 04/10/2020  ? Liver cirrhosis ([place]) 03/08/2020  ? Major depressive disorder, recurrent episode, moderate with anxious distress ([place]) 02/11/2020  ? Alcohol abuse, in remission 12/16/2019  ? Nicotine dependence, cigarettes, uncomplicated [id number]  ? Generalized anxiety disorder 12/16/2019  ? ? ?Past Surgical History:  ?Procedure Laterality Date  ? ABDOMINAL HYSTERECTOMY  1986  ? CARPAL TUNNEL RELEASE Bilateral 1980  ? Per patient  ? SHOULDER SURGERY Left 1980  ? 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  ? Procedure: RIGHT TOTAL HIP ARTHROPLASTY ANTERIOR APPROACH;  Surgeon: Frederik Pear, MD;  Location: [facility];  Service: Orthopedics;  Laterality: Right;  ? TOTAL HIP ARTHROPLASTY Left 11/11/2021  ? Procedure: LEFT TOTAL HIP ARTHROPLASTY ANTERIOR APPROACH;  Surgeon: Frederik Pear, MD;  Location: [facility];  Service: Orthopedics;  Laterality: Left;  ? ? ?OB History   ?No obstetric history on file. ?  ? ? ? ?Home Medications   ? ?Prior to Admission medications   ?Medication Sig Start  Date End Date Taking? Authorizing Provider  ?amLODipine (NORVASC) 10 MG tablet TAKE 1 TABLET(10 MG) BY MOUTH DAILY ?Patient taking differently: Take 10 mg by mouth daily. 09/06/21  Yes Dorna Mai, MD  ?aspirin EC 81 MG tablet Take 1 tablet (81 mg total) by mouth 2 (two) times daily. 12/24/20  Yes Leighton Parody, PA-C  ?atorvastatin (LIPITOR) 40 MG tablet Take 1 tablet (40 mg total) by mouth daily. 12/18/20  Yes Nicolette Bang, MD  ?benzonatate (TESSALON) 100 MG capsule Take 1 capsule (100 mg total) by mouth 2  (two) times daily as needed for cough. 11/20/21  Yes Lavina Hamman, MD  ?budesonide-formoterol (SYMBICORT) 160-4.5 MCG/ACT inhaler INHALE 2 PUFFS INTO THE LUNGS TWICE DAILY ?Patient taking differently: Inhale 2 puffs into the lungs 2 (two) times daily. 10/29/21  Yes Dorna Mai, MD  ?busPIRone (BUSPAR) 10 MG tablet Take 1 tablet (10 mg total) by mouth 3 (three) times daily. 09/24/21  Yes Eulis Canner E, NP  ?fluticasone furoate-vilanterol (BREO ELLIPTA) 100-25 MCG/INH AEPB Inhale 1 puff into the lungs daily. 01/07/21  Yes Nicolette Bang, MD  ?folic acid (FOLVITE) 1 MG tablet TAKE 1 TABLET(1 MG) BY MOUTH DAILY ?Patient taking differently: Take 1 mg by mouth daily. 09/30/21  Yes Dorna Mai, MD  ?gabapentin (NEURONTIN) 300 MG capsule TAKE 1 CAPSULE(300 MG) BY MOUTH THREE TIMES DAILY ?Patient taking differently: Take 300 mg by mouth 3 (three) times daily. 10/01/21  Yes Dorna Mai, MD  ?guaiFENesin (MUCINEX) 600 MG 12 hr tablet Take 1 tablet (600 mg total) by mouth 2 (two) times daily. 11/20/21 11/20/22 Yes Lavina Hamman, MD  ?hydrOXYzine (ATARAX) 50 MG tablet TAKE 1 TABLET (50 MG TOTAL) BY MOUTH 3 (THREE) TIMES DAILY AS NEEDED FOR ANXIETY. ?Patient taking differently: Take 50 mg by mouth every 8 (eight) hours as needed for anxiety. 09/24/21  Yes Eulis Canner E, NP  ?ibuprofen (ADVIL) 200 MG tablet Take 600 mg by mouth daily as needed for moderate pain.   Yes [provider]  ?losartan (COZAAR) 100 MG tablet Take 1 tablet (100 mg total) by mouth daily. 10/31/21  Yes Dorna Mai, MD  ?Menthol (ICY HOT) 5 % PTCH Apply 1 patch topically daily as needed (hip pain).   Yes [provider]  ?Multiple Vitamin (MULTIVITAMIN WITH MINERALS) TABS tablet Take 1 tablet by mouth daily.   Yes [provider]  ?oxybutynin (DITROPAN XL) 15 MG 24 hr tablet TAKE 1 TABLET(15 MG) BY MOUTH AT BEDTIME ?Patient taking differently: Take 15 mg by mouth daily. 11/01/21  Yes Dorna Mai, MD   ?OXYGEN Inhale 4 L into the lungs at bedtime.   Yes [provider]  ?pantoprazole (PROTONIX) 40 MG tablet TAKE 1 TABLET(40 MG) BY MOUTH TWICE DAILY ?Patient taking differently: Take 40 mg by mouth 2 (two) times daily. 11/14/21  Yes Dorna Mai, MD  ?POTASSIUM PO Take 1 tablet by mouth daily.   Yes [provider]  ?QUEtiapine (SEROQUEL) 300 MG tablet TAKE 1 TABLET(300 MG) BY MOUTH AT BEDTIME ?Patient taking differently: Take 300 mg by mouth at bedtime. 09/24/21  Yes Eulis Canner E, NP  ?sertraline (ZOLOFT) 100 MG tablet Take 2 tablets (200 mg total) by mouth daily. 09/24/21  Yes Eulis Canner E, NP  ?tizanidine (ZANAFLEX) 2 MG capsule Take 2 capsules (4 mg total) by mouth 3 (three) times daily as needed for muscle spasms. 11/14/21  Yes Frederik Pear, MD  ?traMADol (ULTRAM) 50 MG tablet Take 50 mg by mouth  every 6 (six) hours as needed for severe pain. 08/30/21  Yes [provider]  ?VITAMIN D PO Take 1 capsule by mouth daily.   Yes [provider]  ? ? ?Family History ?Family History  ?Problem Relation Age of Onset  ? Ulcers Father   ? Hypotension Sister   ? Hypertension Brother   ? ? ?Social History ?Social History  ? ?Tobacco Use  ? Smoking status: Every Day  ?  Packs/day: 1.00  ?  Years: 35.00  ?  Pack years: 35.00  ?  Types: Cigarettes  ?  Start date: 08/04/1978  ? Smokeless tobacco: Never  ?Vaping Use  ? Vaping Use: Never used  ?Substance Use Topics  ? Alcohol use: Never  ? Drug use: Never  ? ? ? ?Allergies   ?Amoxicillin ? ? ?Review of Systems ?Review of Systems ?Per HPI ? ?Physical Exam ?Triage Vital Signs ?ED Triage Vitals  ?Enc Vitals Group  ?   BP 11/27/21 1217 (!) 161/80  ?   Pulse Rate 11/27/21 1217 99  ?   Resp 11/27/21 1217 20  ?   Temp 11/27/21 1217 98.1 ?F (36.7 ?C)  ?   Temp Source 11/27/21 1217 Oral  ?   SpO2 11/27/21 1217 91 %  ?   Weight --   ?   Height --   ?   Head Circumference --   ?   Peak Flow --   ?   Pain Score 11/27/21 1218 9  ?   Pain Loc --   ?    Pain Edu? --   ?   Excl. in [place]? --   ? ?No data found. ? ?Updated Vital Signs ?BP (!) 161/80 (BP Location: Left Arm)   Pulse 99   Temp 98.1 ?F (36.7 ?C) (Oral)   Resp 20   SpO2 91%  ? ?Visual Acuity ?

## 2021-11-27 NOTE — Discharge Instructions (Addendum)
Patient sent to hospital via EMS.  

## 2021-11-27 NOTE — ED Triage Notes (Signed)
Patient states that she had hip surgery and now she is having bilateral leg and feet swelling x 1 week.  Patient is requesting an antibiotic. ?

## 2021-11-27 NOTE — ED Notes (Signed)
Given soda and crackers  

## 2021-11-27 NOTE — ED Notes (Signed)
Pt alert, NAD, calm, interactive. EDP into room. Appears SOB, but denies sob, and states breathing is baseline.  ?

## 2021-11-27 NOTE — ED Provider Notes (Signed)
?[facility] EMERGENCY DEPARTMENT ?Provider Note ? ? ?CSN: [id number] ?Arrival date & time: 11/27/21  1324 ? ?  ? ?History ? ?Chief Complaint  ?Patient presents with  ? Hip Pain  ? Shortness of Breath  ? ? ?[name] is a 61 y.o. female. ? ?HPI ?Patient presents for evaluation of pain redness and swelling of the left hip, following total hip replacement on 11/11/2021.  At that time she had been admitted for hip replacement due to arthritis.  She was subsequently admitted to the hospital on 11/17/2021 and treated for sepsis with bilateral bronchopneumonia.  Today she went to see her PCP, and was directed here for evaluation.  Oxygen saturations were 90 to 91% on room air, she does use oxygen at home.  She denies significant cough or shortness of breath at this time.  She completed all the antibiotics taken for lung infection.  She was supposed to see her orthopedist yesterday, but was having pain in her feet due to swelling so did not go.  She initially went to the urgent care today by private vehicle that was transferred here by EMS for evaluation. ?  ? ?Home Medications ?Prior to Admission medications   ?Medication Sig Start Date End Date Taking? Authorizing Provider  ?amLODipine (NORVASC) 10 MG tablet TAKE 1 TABLET(10 MG) BY MOUTH DAILY ?Patient taking differently: Take 10 mg by mouth daily. 09/06/21  Yes Georganna Skeans, MD  ?aspirin EC 81 MG tablet Take 1 tablet (81 mg total) by mouth 2 (two) times daily. 12/24/20  Yes Allena Katz, PA-C  ?atorvastatin (LIPITOR) 40 MG tablet Take 1 tablet (40 mg total) by mouth daily. 12/18/20  Yes Arvilla Market, MD  ?benzonatate (TESSALON) 100 MG capsule Take 1 capsule (100 mg total) by mouth 2 (two) times daily as needed for cough. 11/20/21  Yes Rolly Salter, MD  ?bismuth subsalicylate (PEPTO BISMOL) 262 MG/15ML suspension Take 30 mLs by mouth every 6 (six) hours as needed for indigestion.   Yes [provider]  ?budesonide-formoterol  (SYMBICORT) 160-4.5 MCG/ACT inhaler INHALE 2 PUFFS INTO THE LUNGS TWICE DAILY ?Patient taking differently: Inhale 2 puffs into the lungs 2 (two) times daily. 10/29/21  Yes Georganna Skeans, MD  ?busPIRone (BUSPAR) 10 MG tablet Take 1 tablet (10 mg total) by mouth 3 (three) times daily. 09/24/21  Yes Toy Cookey E, NP  ?folic acid (FOLVITE) 1 MG tablet TAKE 1 TABLET(1 MG) BY MOUTH DAILY ?Patient taking differently: Take 1 mg by mouth daily. 09/30/21  Yes Georganna Skeans, MD  ?gabapentin (NEURONTIN) 300 MG capsule TAKE 1 CAPSULE(300 MG) BY MOUTH THREE TIMES DAILY ?Patient taking differently: Take 300 mg by mouth 3 (three) times daily. 10/01/21  Yes Georganna Skeans, MD  ?guaiFENesin (MUCINEX) 600 MG 12 hr tablet Take 1 tablet (600 mg total) by mouth 2 (two) times daily. ?Patient taking differently: Take 600 mg by mouth 2 (two) times daily as needed for cough. 11/20/21 11/20/22 Yes Rolly Salter, MD  ?hydrOXYzine (ATARAX) 50 MG tablet TAKE 1 TABLET (50 MG TOTAL) BY MOUTH 3 (THREE) TIMES DAILY AS NEEDED FOR ANXIETY. ?Patient taking differently: Take 50 mg by mouth every 8 (eight) hours as needed for anxiety. 09/24/21  Yes Toy Cookey E, NP  ?ibuprofen (ADVIL) 200 MG tablet Take 600 mg by mouth daily as needed for moderate pain.   Yes [provider]  ?losartan (COZAAR) 100 MG tablet Take 1 tablet (100 mg total) by mouth daily. ?Patient taking differently: Take 50  mg by mouth daily. 10/31/21  Yes Georganna SkeansWilson, Amelia, MD  ?Multiple Vitamin (MULTIVITAMIN WITH MINERALS) TABS tablet Take 1 tablet by mouth daily.   Yes [provider]  ?oxybutynin (DITROPAN XL) 15 MG 24 hr tablet TAKE 1 TABLET(15 MG) BY MOUTH AT BEDTIME ?Patient taking differently: Take 15 mg by mouth at bedtime. 11/01/21  Yes Georganna SkeansWilson, Amelia, MD  ?OXYGEN Inhale 4 L into the lungs at bedtime.   Yes [provider]  ?pantoprazole (PROTONIX) 40 MG tablet TAKE 1 TABLET(40 MG) BY MOUTH TWICE DAILY ?Patient taking differently: Take 40 mg by  mouth 2 (two) times daily. 11/14/21  Yes Georganna SkeansWilson, Amelia, MD  ?POTASSIUM PO Take 1 tablet by mouth daily.   Yes [provider]  ?QUEtiapine (SEROQUEL) 300 MG tablet TAKE 1 TABLET(300 MG) BY MOUTH AT BEDTIME ?Patient taking differently: Take 300 mg by mouth at bedtime. 09/24/21  Yes Toy CookeyParsons, Brittney E, NP  ?sertraline (ZOLOFT) 100 MG tablet Take 2 tablets (200 mg total) by mouth daily. ?Patient taking differently: Take 100 mg by mouth daily. 09/24/21  Yes Toy CookeyParsons, Brittney E, NP  ?tiZANidine (ZANAFLEX) 4 MG tablet Take 4 mg by mouth every 6 (six) hours as needed for muscle spasms.   Yes [provider]  ?traMADol (ULTRAM) 50 MG tablet Take 50 mg by mouth every 6 (six) hours as needed for severe pain. 08/30/21  Yes [provider]  ?VITAMIN D PO Take 1 capsule by mouth daily.   Yes [provider]  ?cefdinir (OMNICEF) 300 MG capsule Take 300 mg by mouth 2 (two) times daily. 11/20/21   [provider]  ?fluticasone furoate-vilanterol (BREO ELLIPTA) 100-25 MCG/INH AEPB Inhale 1 puff into the lungs daily. ?Patient not taking: Reported on 11/27/2021 01/07/21   Arvilla MarketWallace, Catherine Lauren, MD  ?tizanidine (ZANAFLEX) 2 MG capsule Take 2 capsules (4 mg total) by mouth 3 (three) times daily as needed for muscle spasms. ?Patient not taking: Reported on 11/27/2021 11/14/21   Gean Birchwoodowan, Frank, MD  ?   ? ?Allergies    ?Amoxicillin   ? ?Review of Systems   ?Review of Systems ? ?Physical Exam ?Updated Vital Signs ?BP (!) 154/64   Pulse 97   Temp 98.5 ?F (36.9 ?C) (Oral)   Resp 14   Ht 5\' 2"  (1.575 m)   Wt 108 kg   SpO2 99%   BMI 43.53 kg/m?  ?Physical Exam ?Vitals and nursing note reviewed.  ?Constitutional:   ?   General: She is not in acute distress. ?   Appearance: She is well-developed. She is obese. She is not ill-appearing or diaphoretic.  ?HENT:  ?   Head: Normocephalic and atraumatic.  ?   Right Ear: External ear normal.  ?   Left Ear: External ear normal.  ?Eyes:  ?    Conjunctiva/sclera: Conjunctivae normal.  ?   Pupils: Pupils are equal, round, and reactive to light.  ?Neck:  ?   Trachea: Phonation normal.  ?Cardiovascular:  ?   Rate and Rhythm: Normal rate and regular rhythm.  ?   Heart sounds: Normal heart sounds.  ?Pulmonary:  ?   Effort: Pulmonary effort is normal.  ?   Breath sounds: Normal breath sounds.  ?Abdominal:  ?   Palpations: Abdomen is soft.  ?   Tenderness: There is no abdominal tenderness.  ?Musculoskeletal:     ?   General: Normal range of motion.  ?   Cervical back: Normal range of motion and neck supple.  ?Skin: ?  General: Skin is warm and dry.  ?Neurological:  ?   Mental Status: She is alert and oriented to person, place, and time.  ?   Cranial Nerves: No cranial nerve deficit.  ?   Sensory: No sensory deficit.  ?   Motor: No abnormal muscle tone.  ?   Coordination: Coordination normal.  ?Psychiatric:     ?   Behavior: Behavior normal.     ?   Thought Content: Thought content normal.     ?   Judgment: Judgment normal.  ? ? ?ED Results / Procedures / Treatments   ?Labs ?(all labs ordered are listed, but only abnormal results are displayed) ?Labs Reviewed  ?LACTIC ACID, PLASMA - Abnormal; Notable for the following components:  ?    Result Value  ? Lactic Acid, Venous 2.4 (*)   ? All other components within normal limits  ?COMPREHENSIVE METABOLIC PANEL - Abnormal; Notable for the following components:  ? Glucose, Bld 133 (*)   ? BUN <5 (*)   ? Alkaline Phosphatase 133 (*)   ? All other components within normal limits  ?CBC WITH DIFFERENTIAL/PLATELET - Abnormal; Notable for the following components:  ? WBC 14.2 (*)   ? RBC 3.33 (*)   ? Hemoglobin 9.6 (*)   ? HCT 31.4 (*)   ? Platelets 810 (*)   ? Neutro Abs 10.5 (*)   ? Monocytes Absolute 1.1 (*)   ? Eosinophils Absolute 0.6 (*)   ? Basophils Absolute 0.2 (*)   ? All other components within normal limits  ?URINALYSIS, ROUTINE W REFLEX MICROSCOPIC - Abnormal; Notable for the following components:  ?  Leukocytes,Ua SMALL (*)   ? Bacteria, UA RARE (*)   ? Non Squamous Epithelial 0-5 (*)   ? All other components within normal limits  ?RESP PANEL BY RT-PCR (FLU A&B, COVID) [id number]  ?CULTURE, BLOOD (ROUTINE X 2)  ?CULTURE, BLOOD (ROUTINE X 2)

## 2021-11-27 NOTE — ED Provider Triage Note (Signed)
Emergency Medicine Provider Triage Evaluation Note ? ?[name] , a 61 y.o. female  was evaluated in triage.  Pt complains of swelling, warmth, and pain to left hip.  Reports that symptoms started 4 days ago and have been getting progressively worse.  Patient reports that she had left total hip arthroplasty on 11/11/2021.  Patient was admitted to the hospital on 4/16 for sepsis due to unspecified organism and left cellulitis. ? ?Patient also reports that she was treated for pneumonia.  Completed oral antibiotics.  Patient states that she is continue to have a productive cough.  Patient denies any shortness of breath or hemoptysis.  Patient uses oxygen at night when sleeping. ? ?Denies any recent falls or injuries. ? ?Review of Systems  ?Positive: Swelling, warmth, and pain to left hip.  Fever, chills. ?Negative: Numbness, weakness, hemoptysis, chest pain, shortness of breath ? ?Physical Exam  ?BP (!) 145/89   Pulse 98   Temp 99.6 ?F (37.6 ?C) (Oral)   Resp 20   Ht 5\' 2"  (1.575 m)   Wt 108 kg   SpO2 96%   BMI 43.53 kg/m?  ?Gen:   Awake, no distress   ?Resp:  Normal effort  ?MSK:   Moves extremities without difficulty  ?Other:  Swelling, erythema, and warmth to left thigh surrounding incision site.  Incisions are clean, dry, and intact without any purulent discharge.  +2 left DP pulse.  Sensation intact to all digits of left foot. ? ?Medical Decision Making  ?Medically screening exam initiated at 1:38 PM.  Appropriate orders placed.  Arlene Brickel was informed that the remainder of the evaluation will be completed by another provider, this initial triage assessment does not replace that evaluation, and the importance of remaining in the ED until their evaluation is complete. ? ?Concern for possible infection.  Evolving sepsis work-up initiated. ?  ?Julie Crandall, PA-C ?11/27/21 1341 ? ?

## 2021-11-27 NOTE — ED Notes (Signed)
Difficult IV start, unsuccessful x2 attempts. Will use [place].  ?

## 2021-11-27 NOTE — ED Notes (Signed)
Patient is being discharged from the Urgent Care and sent to the Emergency Department via EMS . Per [name] patient is in need of higher level of care due to further evaluation. Patient is aware and verbalizes understanding of plan of care.  ?Vitals:  ? 11/27/21 1217  ?BP: (!) 161/80  ?Pulse: 99  ?Resp: 20  ?Temp: 98.1 ?F (36.7 ?C)  ?SpO2: 91%  ?  ?

## 2021-11-28 ENCOUNTER — Other Ambulatory Visit: Payer: Self-pay | Admitting: Family Medicine

## 2021-11-28 NOTE — Telephone Encounter (Signed)
Requested Prescriptions  ?Pending Prescriptions Disp Refills  ?? gabapentin (NEURONTIN) 300 MG capsule [Pharmacy Med Name: GABAPENTIN 300MG  CAPSULES] 90 capsule 2  ?  Sig: TAKE 1 CAPSULE(300 MG) BY MOUTH THREE TIMES DAILY  ?  ? Neurology: Anticonvulsants - gabapentin Passed - 11/27/2021  3:22 AM  ?  ?  Passed - Cr in normal range and within 360 days  ?  Creatinine, Ser  ?Date Value Ref Range Status  ?11/27/2021 0.74 0.44 - 1.00 mg/dL Final  ?   ?  ?  Passed - Completed PHQ-2 or PHQ-9 in the last 360 days  ?  ?  Passed - Valid encounter within last 12 months  ?  Recent Outpatient Visits   ?      ? 4 weeks ago Pre-op evaluation  ? Primary Care at [facility], MD  ? 3 months ago Acute bronchitis, unspecified organism  ? Primary Care at [facility], MD  ? 7 months ago Uncontrolled hypertension  ? Primary Care at [facility], MD  ? 7 months ago Uncontrolled hypertension  ? Primary Care at [facility], MD  ? 8 months ago Diabetes mellitus without complication [facility])  ? Primary Care at [facility], FNP  ?  ?  ? ?  ?  ?  ? ?

## 2021-11-29 LAB — URINE CULTURE: Culture: 30000 — AB

## 2021-11-30 ENCOUNTER — Telehealth (HOSPITAL_BASED_OUTPATIENT_CLINIC_OR_DEPARTMENT_OTHER): Payer: Self-pay | Admitting: *Deleted

## 2021-11-30 NOTE — Progress Notes (Signed)
ED Antimicrobial Stewardship Positive Culture Follow Up  ? ?[name] is an 61 y.o. female who presented to [facility] on 11/27/2021 with a chief complaint of  ?Chief Complaint  ?Patient presents with  ? Hip Pain  ? Shortness of Breath  ? ? ?Recent Results (from the past 720 hour(s))  ?Surgical PCR Screen     Status: None  ? Collection Time: 10/31/21  1:18 PM  ? Specimen: Nasal Mucosa; Nasal Swab  ?Result Value Ref Range Status  ? MRSA, PCR NEGATIVE NEGATIVE Final  ? Staphylococcus aureus NEGATIVE NEGATIVE Final  ?  Comment: (NOTE) ?The Xpert SA Assay (FDA approved for NASAL specimens in patients 5622 ?years of age and older), is one component of a comprehensive ?surveillance program. It is not intended to diagnose infection nor to ?guide or monitor treatment. ?[facility], [address] ?[place], [place] [address] ?  ?Blood culture (routine x 2)     Status: None  ? Collection Time: 11/17/21  5:32 PM  ? Specimen: BLOOD  ?Result Value Ref Range Status  ? Specimen Description   Final  ?  BLOOD RIGHT ANTECUBITAL ?Performed at [facility], [address] [place], [place] [address] ?  ? Special Requests   Final  ?  BOTTLES DRAWN AEROBIC AND ANAEROBIC Blood Culture adequate volume ?Performed at [facility], [address] [place], [place] [address] ?  ? Culture   Final  ?  NO GROWTH 5 DAYS ?Performed at [facility], [address] [place], [place] [address] ?  ? Report Status 11/22/2021 FINAL  Final  ?Blood culture (routine x 2)     Status: None  ? Collection Time: 11/17/21  5:45 PM  ? Specimen: BLOOD  ?Result Value Ref Range Status  ? Specimen Description   Final  ?  BLOOD LEFT ANTECUBITAL ?Performed at [facility], [address] [place], [place] [address] ?  ? Special Requests   Final  ?  BOTTLES DRAWN AEROBIC AND ANAEROBIC Blood Culture results may not be optimal due to an excessive volume of blood received in  culture bottles ?Performed at [facility], [address] [place], [place] [address] ?  ? Culture   Final  ?  NO GROWTH 5 DAYS ?Performed at [facility], [address] [place], [place] [address] ?  ? Report Status 11/22/2021 FINAL  Final  ?Urine Culture     Status: Abnormal  ? Collection Time: 11/17/21  8:03 PM  ? Specimen: Urine, Clean Catch  ?Result Value Ref Range Status  ? Specimen Description   Final  ?  URINE, CLEAN CATCH ?Performed at [facility], [address] [place], [place] [address] ?  ? Special Requests   Final  ?  NONE ?Performed at [facility], [address] [place], [place] [address] ?  ? Culture (A)  Final  ?  <10,000 COLONIES/mL INSIGNIFICANT GROWTH ?Performed at [facility], [address] [place], [place] [address] ?  ? Report Status 11/19/2021 FINAL  Final  ?MRSA Next Gen by PCR, Nasal     Status: None  ? Collection Time: 11/18/21  1:39 PM  ? Specimen: Nasal Mucosa; Nasal Swab  ?Result Value Ref Range Status  ? MRSA by PCR Next Gen NOT DETECTED NOT DETECTED Final  ?  Comment: (NOTE) ?The GeneXpert MRSA Assay (FDA approved for NASAL specimens only), ?is one  component of a comprehensive MRSA colonization surveillance ?program. It is not intended to diagnose MRSA infection nor to guide ?or monitor treatment for MRSA infections. ?Test performance is not FDA approved in patients less than 2 years ?old. ?Performed at [facility], [address] ?[place], [place] [address] ?  ?Resp Panel by RT-PCR (Flu A&B, Covid) Nasopharyngeal Swab     Status: None  ? Collection Time: 11/27/21  1:33 PM  ? Specimen: Nasopharyngeal Swab; Nasopharyngeal(NP) swabs in vial transport medium  ?Result Value Ref Range Status  ? SARS Coronavirus 2 by RT PCR NEGATIVE NEGATIVE Final  ?  Comment: (NOTE) ?SARS-CoV-2 target nucleic acids are NOT DETECTED. ? ?The SARS-CoV-2 RNA is generally detectable in upper  respiratory ?specimens during the acute phase of infection. The lowest ?concentration of SARS-CoV-2 viral copies this assay can detect is ?138 copies/mL. A negative result does not preclude SARS-Cov-2 ?infection and should not be used as the sole basis for treatment or ?other patient management decisions. A negative result may occur with  ?improper specimen collection/handling, submission of specimen other ?than nasopharyngeal swab, presence of viral mutation(s) within the ?areas targeted by this assay, and inadequate number of viral ?copies(<138 copies/mL). A negative result must be combined with ?clinical observations, patient history, and epidemiological ?information. The expected result is Negative. ? ?Fact Sheet for Patients:  ?BloggerCourse.com ? ?Fact Sheet for Healthcare Providers:  ?SeriousBroker.it ? ?This test is no t yet approved or cleared by the [place] FDA and  ?has been authorized for detection and/or diagnosis of SARS-CoV-2 by ?FDA under an Emergency Use Authorization (EUA). This EUA will remain  ?in effect (meaning this test can be used) for the duration of the ?COVID-19 declaration under Section 564(b)(1) of the Act, 21 ?U.S.C.section 360bbb-3(b)(1), unless the authorization is terminated  ?or revoked sooner.  ? ? ?  ? Influenza A by PCR NEGATIVE NEGATIVE Final  ? Influenza B by PCR NEGATIVE NEGATIVE Final  ?  Comment: (NOTE) ?The Xpert Xpress SARS-CoV-2/FLU/RSV plus assay is intended as an aid ?in the diagnosis of influenza from Nasopharyngeal swab specimens and ?should not be used as a sole basis for treatment. Nasal washings and ?aspirates are unacceptable for Xpert Xpress SARS-CoV-2/FLU/RSV ?testing. ? ?Fact Sheet for Patients: ?BloggerCourse.com ? ?Fact Sheet for Healthcare Providers: ?SeriousBroker.it ? ?This test is not yet approved or cleared by the [place] FDA and ?has been  authorized for detection and/or diagnosis of SARS-CoV-2 by ?FDA under an Emergency Use Authorization (EUA). This EUA will remain ?in effect (meaning this test can be used) for the duration of the ?COVID-19 declaration under Section 564(b)(1) of the Act, 21 U.S.C. ?section 360bbb-3(b)(1), unless the authorization is terminated or ?revoked. ? ?Performed at [facility], [address], [place], [place] ?[address] ?  ?Blood Culture (routine x 2)     Status: None (Preliminary result)  ? Collection Time: 11/27/21  1:59 PM  ? Specimen: BLOOD LEFT ARM  ?Result Value Ref Range Status  ? Specimen Description BLOOD LEFT ARM  Final  ? Special Requests   Final  ?  BOTTLES DRAWN AEROBIC ONLY Blood Culture results may not be optimal due to an inadequate volume of blood received in culture bottles  ? Culture   Final  ?  NO GROWTH 3 DAYS ?Performed at [facility], [address] [place], [place] [address] ?  ? Report Status PENDING  Incomplete  ?Blood Culture (routine x 2)     Status: None (  Preliminary result)  ? Collection Time: 11/27/21  2:01 PM  ? Specimen: BLOOD RIGHT ARM  ?Result Value Ref Range Status  ? Specimen Description BLOOD RIGHT ARM  Final  ? Special Requests   Final  ?  BOTTLES DRAWN AEROBIC AND ANAEROBIC Blood Culture adequate volume  ? Culture   Final  ?  NO GROWTH 3 DAYS ?Performed at [facility], [address], [place], [place] [address] ?  ? Report Status PENDING  Incomplete  ?Urine Culture     Status: Abnormal  ? Collection Time: 11/27/21  2:01 PM  ? Specimen: In/Out Cath Urine  ?Result Value Ref Range Status  ? Specimen Description IN/OUT CATH URINE  Final  ? Special Requests   Final  ?  NONE ?Performed at [facility], [address] [place], [place] [address] ?  ? Culture (A)  Final  ?  30,000 COLONIES/mL ENTEROCOCCUS FAECALIS ?>=100,000 COLONIES/mL STAPHYLOCOCCUS EPIDERMIDIS ?  ? Report Status 11/29/2021 FINAL  Final  ? Organism ID, Bacteria ENTEROCOCCUS FAECALIS (A)  Final  ?  Organism ID, Bacteria STAPHYLOCOCCUS EPIDERMIDIS (A)  Final  ?    Susceptibility  ? Enterococcus faecalis - MIC*  ?  AMPICILLIN <=2 SENSITIVE Sensitive   ?  NITROFURANTOIN <=16 SENSITIVE Sensitive   ?  VANCOMYCIN 1 SENSITIVE Sensi

## 2021-12-02 LAB — CULTURE, BLOOD (ROUTINE X 2)
Culture: NO GROWTH
Culture: NO GROWTH
Special Requests: ADEQUATE

## 2021-12-05 ENCOUNTER — Telehealth (HOSPITAL_COMMUNITY): Payer: Medicaid Other | Admitting: Psychiatry

## 2021-12-07 NOTE — Telephone Encounter (Signed)
Post ED Visit - Positive Culture Follow-up ? ?Culture report reviewed by antimicrobial stewardship pharmacist: ?[facility] ?[]  , Enzo Bi.D. ?[]  [address], Pharm.D., BCPS AQ-ID ?[]  , Pharm.D., BCPS ?[]  Celedonio Miyamoto, .D., BCPS ?[]  [place], .D., BCPS, AAHIVP ?[]  Georgina Pillion, Pharm.D., BCPS, AAHIVP ?[]  [address], PharmD, BCPS ?[]  , PharmD, BCPS ?[]  [place], PharmD, BCPS ?[]  [address], PharmD ?[]  , PharmD, BCPS ?[x]  Estella Husk, PharmD ? ? [facility] ?[]  Lysle Pearl, PharmD ?[]  , PharmD ?[]  Phillips Climes, PharmD ?[]  , Rph ?[]  Agapito Games) , PharmD ?[]  Verlan Friends, PharmD ?[]  , PharmD ?[]  Mervyn Gay, PharmD ?[]  , PharmD ?[]  Vinnie Level, PharmD ?[]  Gerri Spore, PharmD ?[]  , PharmD ?[]  Len Childs, PharmD ? ? ?Positive urine culture ?Treated with Cefdinir, Pt is not experiencing any new symptoms of UTI, and no further patient follow-up is required at this time per Kristi Horton, D. O.  ? ? ?12/07/2021, 1:58 PM ?  ?

## 2021-12-08 ENCOUNTER — Other Ambulatory Visit: Payer: Self-pay | Admitting: Family Medicine

## 2021-12-08 DIAGNOSIS — I10 Essential (primary) hypertension: Secondary | ICD-10-CM

## 2021-12-08 NOTE — Telephone Encounter (Signed)
Amlodipine refilled per patient request.

## 2021-12-16 ENCOUNTER — Telehealth (INDEPENDENT_AMBULATORY_CARE_PROVIDER_SITE_OTHER): Payer: Medicaid Other | Admitting: Psychiatry

## 2021-12-16 DIAGNOSIS — F411 Generalized anxiety disorder: Secondary | ICD-10-CM

## 2021-12-16 DIAGNOSIS — F32A Depression, unspecified: Secondary | ICD-10-CM | POA: Diagnosis not present

## 2021-12-16 MED ORDER — BUSPIRONE HCL 10 MG PO TABS: 10.0000 mg | ORAL_TABLET | Freq: Three times a day (TID) | ORAL | 0 refills | Status: DC

## 2021-12-16 MED ORDER — QUETIAPINE FUMARATE 300 MG PO TABS: ORAL_TABLET | ORAL | 0 refills | Status: DC

## 2021-12-16 MED ORDER — HYDROXYZINE HCL 50 MG PO TABS: ORAL_TABLET | ORAL | 0 refills | Status: DC

## 2021-12-16 MED ORDER — SERTRALINE HCL 100 MG PO TABS: 200.0000 mg | ORAL_TABLET | Freq: Every day | ORAL | 0 refills | Status: DC

## 2021-12-16 NOTE — Progress Notes (Signed)
BH MD/PA/NP OP Progress Note ? ?12/16/2021 3:17 PM ?Julie SineNancy Simon  ?MRN:  [id number] ? ?Virtual Visit via Telephone Note ? ?I connected with Julie CrandallNancy Simon on 12/16/21 at  3:00 PM EDT by telephone and verified that I am speaking with the correct person using two identifiers. ? ?Location: ?Patient: home ?Provider: offsite ?  ?I discussed the limitations, risks, security and privacy concerns of performing an evaluation and management service by telephone and the availability of in person appointments. I also discussed with the patient that there may be a patient responsible charge related to this service. The patient expressed understanding and agreed to proceed. ? ? ?  ?I discussed the assessment and treatment plan with the patient. The patient was provided an opportunity to ask questions and all were answered. The patient agreed with the plan and demonstrated an understanding of the instructions. ?  ?The patient was advised to call back or seek an in-person evaluation if the symptoms worsen or if the condition fails to improve as anticipated. ? ?I provided 10 minutes of non-face-to-face time during this encounter. ? ? ?Mcneil Sobericely Caeleigh Prohaska, NP  ? ?Chief Complaint: Medication management ? ?HPI: [name] is a 61 year old female [facility] for follow-up psychiatric evaluation.  She has a psychiatric history of generalized anxiety disorder, and major depressive disorder.  Her symptoms are managed with BuSpar 10 mg 3 times daily, hydroxyzine 50 mg 3 times daily as needed for anxiety, Seroquel 300 mg at bedtime daily, and Zoloft 200 mg daily.  Patient reports that medications are effective with managing symptoms and that she is medication compliant.  Patient denies adverse medication reactions or need for dosage adjustment today.  No medication changes today. ? ?Patient is alert and oriented x4, calm, pleasant and willing to engage.  She reports good mood, sleep and appetite.  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  She reports having assistance at home to manage until she is able to heal.  Patient reports good sleep appetite and mood.  Patient denies suicidal or homicidal ideations, paranoia, delusional thought, auditory or visual hallucinations. ? ? ? ?Visit Diagnosis:  ?  ICD-10-CM   ?1. Generalized anxiety disorder  F41.1 busPIRone (BUSPAR) 10 MG tablet  ?  hydrOXYzine (ATARAX) 50 MG tablet  ?  sertraline (ZOLOFT) 100 MG tablet  ?  ?2. Mild depression  F32.A QUEtiapine (SEROQUEL) 300 MG tablet  ?  sertraline (ZOLOFT) 100 MG tablet  ?  ? ? ?Past Psychiatric History: generalized anxiety disorder, and major depressive disorder. ? ?Past Medical History:  ?Past Medical History:  ?Diagnosis Date  ? Anxiety   ? Arthritis   ? COPD (chronic obstructive pulmonary disease) (HCC)   ? uses oxygen 4L at nite  ? Depression   ? Dyspnea   ? GERD (gastroesophageal reflux disease)   ? Hypertension   ? Peripheral edema   ? 2+ at PAT visit  ?  ?Past Surgical History:  ?Procedure Laterality Date  ? ABDOMINAL HYSTERECTOMY  1986  ? CARPAL TUNNEL RELEASE Bilateral 1980  ? Per patient  ? SHOULDER SURGERY Left 1980  ? 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  ? Procedure: RIGHT TOTAL HIP ARTHROPLASTY ANTERIOR APPROACH;  Surgeon: Gean Birchwoodowan, Frank, MD;  Location: [facility];  Service: Orthopedics;  Laterality: Right;  ? TOTAL HIP ARTHROPLASTY Left 11/11/2021  ? Procedure: LEFT TOTAL HIP ARTHROPLASTY ANTERIOR APPROACH;  Surgeon: Gean Birchwoodowan, Frank, MD;  Location: [facility];  Service: Orthopedics;  Laterality: Left;  ? ? ?Family Psychiatric History: n/a ? ?Family History:  ?Family History  ?Problem Relation Age of Onset  ? Ulcers Father   ? Hypotension Sister   ? Hypertension Brother   ? ? ?Social History:  ?Social History  ? ?Socioeconomic History  ? Marital status: Divorced  ?  Spouse name: Not on file  ? Number of children: 0  ? Years of education: 39  ? Highest education level: GED or  equivalent  ?Occupational History  ? Occupation: Armed forces operational officer  ?Tobacco Use  ? Smoking status: Every Day  ?  Packs/day: 1.00  ?  Years: 35.00  ?  Pack years: 35.00  ?  Types: Cigarettes  ?  Start date: 08/04/1978  ? Smokeless tobacco: Never  ?Vaping Use  ? Vaping Use: Never used  ?Substance and Sexual Activity  ? Alcohol use: Never  ? Drug use: Never  ? Sexual activity: Never  ?Other Topics Concern  ? Not on file  ?Social History Narrative  ? Lives and step-dad, they are able to help.  ? ?Social Determinants of Health  ? ?Financial Resource Strain: Not on file  ?Food Insecurity: Not on file  ?Transportation Needs: Not on file  ?Physical Activity: Not on file  ?Stress: Not on file  ?Social Connections: Not on file  ? ? ?Allergies:  ?Allergies  ?Allergen Reactions  ? Amoxicillin   ?  Yeast infection ; tolerated Cefepime  ? ? ?Metabolic Disorder Labs: ?Lab Results  ?Component Value Date  ? HGBA1C 6.1 (H) 11/17/2021  ? MPG 128.37 11/17/2021  ? MPG 140 12/25/2020  ? ?No results found for: PROLACTIN ?Lab Results  ?Component Value Date  ? CHOL 234 (H) 12/11/2020  ? TRIG 245 (H) 12/11/2020  ? HDL 55 12/11/2020  ? CHOLHDL 4.3 12/11/2020  ? LDLCALC 135 (H) 12/11/2020  ? ?Lab Results  ?Component Value Date  ? TSH 2.370 05/22/2020  ? ? ?Therapeutic Level Labs: ?No results found for: LITHIUM ?No results found for: VALPROATE ?No components found for:  CBMZ ? ?Current Medications: ?Current Outpatient Medications  ?Medication Sig Dispense Refill  ? amLODipine (NORVASC) 10 MG tablet TAKE 1 TABLET(10 MG) BY MOUTH DAILY 90 tablet 0  ? aspirin EC 81 MG tablet Take 1 tablet (81 mg total) by mouth 2 (two) times daily. 60 tablet 0  ? atorvastatin (LIPITOR) 40 MG tablet Take 1 tablet (40 mg total) by mouth daily. 90 tablet 3  ? benzonatate (TESSALON) 100 MG capsule Take 1 capsule (100 mg total) by mouth 2 (two) times daily as needed for cough. 20 capsule 0  ? bismuth subsalicylate (PEPTO BISMOL) 262 MG/15ML suspension Take 30 mLs by  mouth every 6 (six) hours as needed for indigestion.    ? budesonide-formoterol (SYMBICORT) 160-4.5 MCG/ACT inhaler INHALE 2 PUFFS INTO THE LUNGS TWICE DAILY 10.2 g 0  ? busPIRone (BUSPAR) 10 MG tablet Take 1 tablet (10 mg total) by mouth 3 (three) times daily. 90 tablet 0  ? cefdinir (OMNICEF) 300 MG capsule Take 300 mg by mouth 2 (two) times daily.    ? fluticasone furoate-vilanterol (BREO ELLIPTA) 100-25 MCG/INH AEPB Inhale 1 puff into the lungs daily. (Patient not taking: Reported on 11/27/2021) 60 each 11  ? folic acid (FOLVITE) 1 MG tablet TAKE 1 TABLET(1 MG) BY MOUTH DAILY (Patient taking differently: Take 1 mg by mouth daily.) 90 tablet 0  ? gabapentin (NEURONTIN) 300 MG capsule TAKE 1 CAPSULE(300 MG) BY MOUTH THREE TIMES DAILY 90 capsule 2  ?  guaiFENesin (MUCINEX) 600 MG 12 hr tablet Take 1 tablet (600 mg total) by mouth 2 (two) times daily. (Patient taking differently: Take 600 mg by mouth 2 (two) times daily as needed for cough.) 60 tablet 2  ? hydrOXYzine (ATARAX) 50 MG tablet TAKE 1 TABLET (50 MG TOTAL) BY MOUTH 3 (THREE) TIMES DAILY AS NEEDED FOR ANXIETY. 90 tablet 0  ? ibuprofen (ADVIL) 200 MG tablet Take 600 mg by mouth daily as needed for moderate pain.    ? losartan (COZAAR) 100 MG tablet Take 1 tablet (100 mg total) by mouth daily. (Patient taking differently: Take 50 mg by mouth daily.) 90 tablet 0  ? Multiple Vitamin (MULTIVITAMIN WITH MINERALS) TABS tablet Take 1 tablet by mouth daily.    ? oxybutynin (DITROPAN XL) 15 MG 24 hr tablet TAKE 1 TABLET(15 MG) BY MOUTH AT BEDTIME (Patient taking differently: Take 15 mg by mouth at bedtime.) 90 tablet 0  ? OXYGEN Inhale 4 L into the lungs at bedtime.    ? pantoprazole (PROTONIX) 40 MG tablet TAKE 1 TABLET(40 MG) BY MOUTH TWICE DAILY (Patient taking differently: Take 40 mg by mouth 2 (two) times daily.) 180 tablet 0  ? POTASSIUM PO Take 1 tablet by mouth daily.    ? QUEtiapine (SEROQUEL) 300 MG tablet TAKE 1 TABLET(300 MG) BY MOUTH AT BEDTIME 30 tablet  0  ? sertraline (ZOLOFT) 100 MG tablet Take 2 tablets (200 mg total) by mouth daily. 60 tablet 0  ? tizanidine (ZANAFLEX) 2 MG capsule Take 2 capsules (4 mg total) by mouth 3 (three) times daily as needed for

## 2021-12-27 ENCOUNTER — Other Ambulatory Visit: Payer: Self-pay | Admitting: Family Medicine

## 2021-12-27 DIAGNOSIS — I10 Essential (primary) hypertension: Secondary | ICD-10-CM

## 2022-01-20 ENCOUNTER — Telehealth (HOSPITAL_COMMUNITY): Payer: Self-pay | Admitting: *Deleted

## 2022-01-20 ENCOUNTER — Other Ambulatory Visit (HOSPITAL_COMMUNITY): Payer: Self-pay | Admitting: Psychiatry

## 2022-01-20 DIAGNOSIS — F32A Depression, unspecified: Secondary | ICD-10-CM

## 2022-01-20 DIAGNOSIS — F411 Generalized anxiety disorder: Secondary | ICD-10-CM

## 2022-01-20 MED ORDER — BUSPIRONE HCL 10 MG PO TABS: 10.0000 mg | ORAL_TABLET | Freq: Three times a day (TID) | ORAL | 2 refills | Status: DC

## 2022-01-20 MED ORDER — SERTRALINE HCL 100 MG PO TABS: 200.0000 mg | ORAL_TABLET | Freq: Every day | ORAL | 2 refills | Status: DC

## 2022-01-20 MED ORDER — QUETIAPINE FUMARATE 300 MG PO TABS: ORAL_TABLET | ORAL | 2 refills | Status: DC

## 2022-01-20 MED ORDER — HYDROXYZINE HCL 50 MG PO TABS: ORAL_TABLET | ORAL | 2 refills | Status: DC

## 2022-01-20 NOTE — Telephone Encounter (Signed)
All psychiatric medications refilled and sent to preferred pharmacy.

## 2022-01-20 NOTE — Telephone Encounter (Signed)
Rx [facility] Refill Request: sertraline (ZOLOFT) 100 MG tablet Take 2 tablets (200 mg total) by mouth daily.,  Starting Mon 12/16/2021, Normal  Dispense: 60 tablet

## 2022-01-26 ENCOUNTER — Other Ambulatory Visit: Payer: Self-pay | Admitting: Family Medicine

## 2022-02-01 ENCOUNTER — Other Ambulatory Visit: Payer: Self-pay | Admitting: Family Medicine

## 2022-02-01 DIAGNOSIS — M87051 Idiopathic aseptic necrosis of right femur: Secondary | ICD-10-CM

## 2022-02-03 NOTE — Telephone Encounter (Signed)
Requested Prescriptions  Pending Prescriptions Disp Refills  . folic acid (FOLVITE) 1 MG tablet [Pharmacy Med Name: FOLIC ACID 1MG  TABLETS] 90 tablet 0    Sig: TAKE 1 TABLET(1 MG) BY MOUTH DAILY     Endocrinology:  Vitamins Passed - 02/01/2022  3:20 AM      Passed - Valid encounter within last 12 months    Recent Outpatient Visits          3 months ago Pre-op evaluation   Primary Care at [facility], MD   6 months ago Acute bronchitis, unspecified organism   Primary Care at [facility], MD   9 months ago Uncontrolled hypertension   Primary Care at [facility], MD   10 months ago Uncontrolled hypertension   Primary Care at [facility], MD   11 months ago Diabetes mellitus without complication [facility])   Primary Care at [facility], FNP

## 2022-02-28 ENCOUNTER — Other Ambulatory Visit: Payer: Self-pay | Admitting: Family Medicine

## 2022-02-28 DIAGNOSIS — G629 Polyneuropathy, unspecified: Secondary | ICD-10-CM

## 2022-02-28 NOTE — Telephone Encounter (Signed)
Requested Prescriptions  Pending Prescriptions Disp Refills  . gabapentin (NEURONTIN) 300 MG capsule [Pharmacy Med Name: GABAPENTIN 300MG  CAPSULES] 90 capsule 2    Sig: TAKE 1 CAPSULE(300 MG) BY MOUTH THREE TIMES DAILY     Neurology: Anticonvulsants - gabapentin Passed - 02/28/2022  3:37 AM      Passed - Cr in normal range and within 360 days    Creatinine, Ser  Date Value Ref Range Status  11/27/2021 0.74 0.44 - 1.00 mg/dL Final         Passed - Completed PHQ-2 or PHQ-9 in the last 360 days      Passed - Valid encounter within last 12 months    Recent Outpatient Visits          4 months ago Pre-op evaluation   Primary Care at [facility], MD   6 months ago Acute bronchitis, unspecified organism   Primary Care at [facility], MD   10 months ago Uncontrolled hypertension   Primary Care at [facility], MD   11 months ago Uncontrolled hypertension   Primary Care at [facility], MD   11 months ago Diabetes mellitus without complication [facility])   Primary Care at [facility], FNP

## 2022-03-09 ENCOUNTER — Other Ambulatory Visit: Payer: Self-pay | Admitting: Family

## 2022-03-09 DIAGNOSIS — I10 Essential (primary) hypertension: Secondary | ICD-10-CM

## 2022-03-09 NOTE — Telephone Encounter (Signed)
Courtesy refill. Schedule appointment.  

## 2022-03-18 ENCOUNTER — Telehealth (INDEPENDENT_AMBULATORY_CARE_PROVIDER_SITE_OTHER): Payer: Medicare Other | Admitting: Psychiatry

## 2022-03-18 ENCOUNTER — Encounter (HOSPITAL_COMMUNITY): Payer: Self-pay | Admitting: Psychiatry

## 2022-03-18 ENCOUNTER — Encounter (HOSPITAL_COMMUNITY): Payer: Self-pay

## 2022-03-18 DIAGNOSIS — F32A Depression, unspecified: Secondary | ICD-10-CM

## 2022-03-18 DIAGNOSIS — G629 Polyneuropathy, unspecified: Secondary | ICD-10-CM | POA: Diagnosis not present

## 2022-03-18 DIAGNOSIS — F411 Generalized anxiety disorder: Secondary | ICD-10-CM

## 2022-03-18 MED ORDER — SERTRALINE HCL 100 MG PO TABS: 200.0000 mg | ORAL_TABLET | Freq: Every day | ORAL | 3 refills | Status: AC

## 2022-03-18 MED ORDER — QUETIAPINE FUMARATE 300 MG PO TABS: ORAL_TABLET | ORAL | 3 refills | Status: DC

## 2022-03-18 MED ORDER — HYDROXYZINE HCL 50 MG PO TABS: ORAL_TABLET | ORAL | 3 refills | Status: AC

## 2022-03-18 MED ORDER — BUSPIRONE HCL 10 MG PO TABS: 10.0000 mg | ORAL_TABLET | Freq: Three times a day (TID) | ORAL | 3 refills | Status: AC

## 2022-03-18 MED ORDER — UNISOM SLEEPMELTS 25 MG PO TBDP: 25.0000 mg | ORAL_TABLET | Freq: Every evening | ORAL | 3 refills | Status: DC | PRN

## 2022-03-18 NOTE — Progress Notes (Signed)
BH MD/PA/NP OP Progress Note Virtual Visit via Telephone Note  I connected with [name] on 03/18/22 at  3:00 PM EDT by telephone and verified that I am speaking with the correct person using two identifiers.  Location: Patient: home Provider: Clinic   I discussed the limitations, risks, security and privacy concerns of performing an evaluation and management service by telephone and the availability of in person appointments. I also discussed with the patient that there may be a patient responsible charge related to this service. The patient expressed understanding and agreed to proceed.   I provided 30 minutes of non-face-to-face time during this encounter.    03/18/2022 3:00 PM [name]  MRN:  [id number]  Chief Complaint: "I am not staying asleep at night"  HPI:  61 year old female seen today for follow-up psychiatric evaluation.  She has a psychiatric history MDD, severe alcohol use disorder now in remission.  She is currently managed on Zoloft 200 mg daily, Seroquel 300 mg nightly, BuSpar 10 mg three times daily, and hydroxyzine 50 mg 3 times daily as needed.  She notes her medications are somewhat effective in managing her psychiatric conditions.   Today patient was unable to login virtually so her assessment was done over the phone.  During exam she was pleasant, cooperative, and engaged in conversation.  She informed Clinical research associate that she is not sleeping fully at night.  She reports her sleep is disturbed and reports she sleeps a few hours at a time.  Since her last visit she notes that her anxiety has improved.  She does note that she occasionally worries about her housing.  For the last 3 months she notes that she has been renting.  She reports that she wants to be able to manage her rent.  Provider also conducted a PHQ-9 and patient scored a 13.  She endorses having adequate appetite.  Today she denies SI/HI/VAH, mania, paranoia.    Patient informed writer that she has had both  hip replaced.  She notes that she is feeling better.  At times she notes that she has pain but reports that it is manageable.  Today she is agreeable to starting Unisom, 25 mg nightly to help manage sleep. Potential side effects of medication and risks vs benefits of treatment vs non-treatment were explained and discussed. All questions were answered. She will continue her other medication as prescribed and follow-up with outpatient counseling for therapy.  No other concerns at this time.    Visit Diagnosis:    ICD-10-CM   1. Generalized anxiety disorder  F41.1 busPIRone (BUSPAR) 10 MG tablet    hydrOXYzine (ATARAX) 50 MG tablet    sertraline (ZOLOFT) 100 MG tablet    2. Neuropathy  G62.9     3. Mild depression  F32.A QUEtiapine (SEROQUEL) 300 MG tablet    sertraline (ZOLOFT) 100 MG tablet      Past Psychiatric History: MDD, severe alcohol use disorder now in remission  Past Medical History:  Past Medical History:  Diagnosis Date   Anxiety    Arthritis    COPD (chronic obstructive pulmonary disease) (HCC)    uses oxygen 4L at nite   Depression    Dyspnea    GERD (gastroesophageal reflux disease)    Hypertension    Peripheral edema    2+ at PAT visit    Past Surgical History:  Procedure Laterality Date   ABDOMINAL HYSTERECTOMY  1986   CARPAL TUNNEL RELEASE Bilateral 1980   Per patient  SHOULDER SURGERY Left 1980   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   Procedure: RIGHT TOTAL HIP ARTHROPLASTY ANTERIOR APPROACH;  Surgeon: Gean Birchwood, MD;  Location: [facility];  Service: Orthopedics;  Laterality: Right;   TOTAL HIP ARTHROPLASTY Left 11/11/2021   Procedure: LEFT TOTAL HIP ARTHROPLASTY ANTERIOR APPROACH;  Surgeon: Gean Birchwood, MD;  Location: [facility];  Service: Orthopedics;  Laterality: Left;    Family Psychiatric History: denied  Family History:  Family History  Problem Relation Age of Onset   Ulcers Father    Hypotension Sister    Hypertension Brother     Social  History:  Social History   Socioeconomic History   Marital status: Divorced    Spouse name: Not on file   Number of children: 0   Years of education: 12   Highest education level: GED or equivalent  Occupational History   Occupation: Armed forces operational officer  Tobacco Use   Smoking status: Every Day    Packs/day: 1.00    Years: 35.00    Total pack years: 35.00    Types: Cigarettes    Start date: 08/04/1978   Smokeless tobacco: Never  Vaping Use   Vaping Use: Never used  Substance and Sexual Activity   Alcohol use: Never   Drug use: Never   Sexual activity: Never  Other Topics Concern   Not on file  Social History Narrative   Lives and step-dad, they are able to help.   Social Determinants of Health   Financial Resource Strain: Not on file  Food Insecurity: Not on file  Transportation Needs: Not on file  Physical Activity: Not on file  Stress: Not on file  Social Connections: Not on file    Allergies:  Allergies  Allergen Reactions   Amoxicillin     Yeast infection ; tolerated Cefepime    Metabolic Disorder Labs: Lab Results  Component Value Date   HGBA1C 6.1 (H) 11/17/2021   MPG 128.37 11/17/2021   MPG 140 12/25/2020   No results found for: "PROLACTIN" Lab Results  Component Value Date   CHOL 234 (H) 12/11/2020   TRIG 245 (H) 12/11/2020   HDL 55 12/11/2020   CHOLHDL 4.3 12/11/2020   LDLCALC 135 (H) 12/11/2020   Lab Results  Component Value Date   TSH 2.370 05/22/2020    Therapeutic Level Labs: No results found for: "LITHIUM" No results found for: "VALPROATE" No results found for: "CBMZ"  Current Medications: Current Outpatient Medications  Medication Sig Dispense Refill   diphenhydrAMINE HCl, Sleep, (UNISOM SLEEPMELTS) 25 MG TBDP Take 1 tablet (25 mg total) by mouth at bedtime as needed. 30 tablet 3   amLODipine (NORVASC) 10 MG tablet TAKE 1 TABLET(10 MG) BY MOUTH DAILY 30 tablet 0   aspirin EC 81 MG tablet Take 1 tablet (81 mg total) by mouth 2  (two) times daily. 60 tablet 0   atorvastatin (LIPITOR) 40 MG tablet Take 1 tablet (40 mg total) by mouth daily. 90 tablet 3   benzonatate (TESSALON) 100 MG capsule Take 1 capsule (100 mg total) by mouth 2 (two) times daily as needed for cough. 20 capsule 0   bismuth subsalicylate (PEPTO BISMOL) 262 MG/15ML suspension Take 30 mLs by mouth every 6 (six) hours as needed for indigestion.     budesonide-formoterol (SYMBICORT) 160-4.5 MCG/ACT inhaler INHALE 2 PUFFS INTO THE LUNGS TWICE DAILY 10.2 g 0   busPIRone (BUSPAR) 10 MG tablet Take 1 tablet (10 mg total) by mouth 3 (three) times daily. 90 tablet 3  cefdinir (OMNICEF) 300 MG capsule Take 300 mg by mouth 2 (two) times daily.     fluticasone furoate-vilanterol (BREO ELLIPTA) 100-25 MCG/INH AEPB Inhale 1 puff into the lungs daily. (Patient not taking: Reported on 11/27/2021) 60 each 11   folic acid (FOLVITE) 1 MG tablet TAKE 1 TABLET(1 MG) BY MOUTH DAILY 90 tablet 2   gabapentin (NEURONTIN) 300 MG capsule TAKE 1 CAPSULE(300 MG) BY MOUTH THREE TIMES DAILY 90 capsule 2   guaiFENesin (MUCINEX) 600 MG 12 hr tablet Take 1 tablet (600 mg total) by mouth 2 (two) times daily. (Patient taking differently: Take 600 mg by mouth 2 (two) times daily as needed for cough.) 60 tablet 2   hydrOXYzine (ATARAX) 50 MG tablet TAKE 1 TABLET (50 MG TOTAL) BY MOUTH 3 (THREE) TIMES DAILY AS NEEDED FOR ANXIETY. 90 tablet 3   ibuprofen (ADVIL) 200 MG tablet Take 600 mg by mouth daily as needed for moderate pain.     losartan (COZAAR) 100 MG tablet TAKE 1 TABLET(100 MG) BY MOUTH DAILY 90 tablet 0   Multiple Vitamin (MULTIVITAMIN WITH MINERALS) TABS tablet Take 1 tablet by mouth daily.     oxybutynin (DITROPAN XL) 15 MG 24 hr tablet TAKE 1 TABLET(15 MG) BY MOUTH AT BEDTIME (Patient taking differently: Take 15 mg by mouth at bedtime.) 90 tablet 0   OXYGEN Inhale 4 L into the lungs at bedtime.     pantoprazole (PROTONIX) 40 MG tablet TAKE 1 TABLET(40 MG) BY MOUTH TWICE DAILY  (Patient taking differently: Take 40 mg by mouth 2 (two) times daily.) 180 tablet 0   POTASSIUM PO Take 1 tablet by mouth daily.     QUEtiapine (SEROQUEL) 300 MG tablet TAKE 1 TABLET(300 MG) BY MOUTH AT BEDTIME 30 tablet 3   sertraline (ZOLOFT) 100 MG tablet Take 2 tablets (200 mg total) by mouth daily. 60 tablet 3   tizanidine (ZANAFLEX) 2 MG capsule Take 2 capsules (4 mg total) by mouth 3 (three) times daily as needed for muscle spasms. (Patient not taking: Reported on 11/27/2021) 60 capsule 0   tiZANidine (ZANAFLEX) 4 MG tablet Take 4 mg by mouth every 6 (six) hours as needed for muscle spasms.     traMADol (ULTRAM) 50 MG tablet Take 50 mg by mouth every 6 (six) hours as needed for severe pain.     VITAMIN D PO Take 1 capsule by mouth daily.     No current facility-administered medications for this visit.     Musculoskeletal: Strength & Muscle Tone:  Unable to assess due to telephone Gait & Station:  Unable to assess due to telephone Patient leans: N/A  Psychiatric Specialty Exam: Review of Systems  There were no vitals taken for this visit.There is no height or weight on file to calculate BMI.  General Appearance:  Unable to assess due to telephone  Eye Contact:   Unable to assess due to telephone  Speech:  Clear and Coherent and Normal Rate  Volume:  Normal  Mood:  Euthymic  Affect:  Appropriate and Congruent  Thought Process:  Coherent, Goal Directed, and Linear  Orientation:  Full (Time, Place, and Person)  Thought Content: WDL and Logical   Suicidal Thoughts:  No  Homicidal Thoughts:  No  Memory:  Immediate;   Good Recent;   Good Remote;   Good  Judgement:  Good  Insight:  Good  Psychomotor Activity:   Unable to assess due to telephone  Concentration:  Concentration: Good and Attention Span: Good  Recall:  Good  Fund of Knowledge: Good  Language: Good  Akathisia:   Unable to assess due to telephone  Handed:  Right  AIMS (if indicated): not done  Assets:   Communication Skills Desire for Improvement Financial Resources/Insurance Housing Leisure Time Physical Health Social Support  ADL's:  Intact  Cognition: WNL  Sleep:  Fair   Screenings: GAD-7    Flowsheet Row Video Visit from 03/18/2022 in [facility] Video Visit from 09/24/2021 in [facility] Office Visit from 04/04/2021 in Primary Care at [facility] Video Visit from 03/27/2021 in [facility] Telemedicine from 01/07/2021 in Primary Care at [facility]  Total GAD-7 Score 5 15 11 11 6       PHQ2-9    Flowsheet Row Video Visit from 03/18/2022 in [facility] Office Visit from 10/31/2021 in Primary Care at [facility] Video Visit from 09/24/2021 in [facility] Office Visit from 08/06/2021 in Primary Care at [facility] Office Visit from 05/02/2021 in Primary Care at [facility] Total Score 3 4 4  0 4  PHQ-9 Total Score 13 11 16  0 14      Flowsheet Row ED from 11/27/2021 in [facility] EMERGENCY DEPARTMENT Most recent reading at 11/27/2021  1:28 PM ED from 11/27/2021 in [facility] Ur[facility] Care at [facility]  Most recent reading at 11/27/2021 12:19 PM ED to Hosp-Admission (Discharged) from 11/17/2021 in [place] LONG 4TH FLOOR PROGRESSIVE CARE AND UROLOGY Most recent reading at 11/18/2021  1:00 AM  C-SSRS RISK CATEGORY No Risk No Risk No Risk        Assessment and Plan: Patient endorses disturbed sleep.  Today she is agreeable to starting Unisom, 25 mg nightly to help manage sleep.  She will continue her other medication as prescribed.  1. Generalized anxiety disorder  Continue- busPIRone (BUSPAR) 10 MG tablet; Take 1 tablet (10 mg total) by mouth 3 (three) times daily.  Dispense: 90 tablet; Refill: 3 Continue- hydrOXYzine (ATARAX) 50 MG tablet; TAKE 1 TABLET (50 MG TOTAL) BY MOUTH 3 (THREE) TIMES DAILY AS NEEDED FOR  ANXIETY.  Dispense: 90 tablet; Refill: 3 Continue- sertraline (ZOLOFT) 100 MG tablet; Take 2 tablets (200 mg total) by mouth daily.  Dispense: 60 tablet; Refill: 3  2.  Mild depression  Continue- QUEtiapine (SEROQUEL) 300 MG tablet; TAKE 1 TABLET(300 MG) BY MOUTH AT BEDTIME  Dispense: 30 tablet; Refill: 3 Continue- sertraline (ZOLOFT) 100 MG tablet; Take 2 tablets (200 mg total) by mouth daily.  Dispense: 60 tablet; Refill: 3   Follow-up in 3 months Follow-up with therapy  11/19/2021, NP 03/18/2022, 3:00 PM

## 2022-03-26 ENCOUNTER — Other Ambulatory Visit: Payer: Self-pay | Admitting: Family Medicine

## 2022-03-26 ENCOUNTER — Telehealth: Payer: Self-pay | Admitting: Family Medicine

## 2022-03-26 NOTE — Telephone Encounter (Addendum)
Medication Refill - Medication: budesonide-formoterol (SYMBICORT) 160-4.5 MCG/ACT inhaler  Has the patient contacted their pharmacy? Yes.     Preferred Pharmacy (with phone number or street name):  [id number] - [place], [place] - [address] AT [facility] [address] Phone:  [phone]  Fax:  [phone]     Has the patient been seen for an appointment in the last year OR does the patient have an upcoming appointment? Yes.    Please assist patient further as patient is out of her medication

## 2022-03-27 ENCOUNTER — Other Ambulatory Visit: Payer: Self-pay | Admitting: Family Medicine

## 2022-03-27 DIAGNOSIS — N3281 Overactive bladder: Secondary | ICD-10-CM

## 2022-03-27 NOTE — Telephone Encounter (Signed)
Medication refilled on 03/26/2022

## 2022-04-14 ENCOUNTER — Ambulatory Visit (INDEPENDENT_AMBULATORY_CARE_PROVIDER_SITE_OTHER): Payer: Medicare Other | Admitting: Family Medicine

## 2022-04-14 ENCOUNTER — Encounter: Payer: Self-pay | Admitting: Family Medicine

## 2022-04-14 VITALS — BP 150/76 | HR 100 | Temp 98.1°F | Resp 18 | Wt 208.0 lb

## 2022-04-14 DIAGNOSIS — K529 Noninfective gastroenteritis and colitis, unspecified: Secondary | ICD-10-CM

## 2022-04-14 DIAGNOSIS — J4540 Moderate persistent asthma, uncomplicated: Secondary | ICD-10-CM

## 2022-04-14 DIAGNOSIS — Z6838 Body mass index (BMI) 38.0-38.9, adult: Secondary | ICD-10-CM

## 2022-04-14 DIAGNOSIS — I10 Essential (primary) hypertension: Secondary | ICD-10-CM

## 2022-04-14 DIAGNOSIS — E669 Obesity, unspecified: Secondary | ICD-10-CM | POA: Diagnosis not present

## 2022-04-14 MED ORDER — AMLODIPINE BESYLATE 10 MG PO TABS: 10.0000 mg | ORAL_TABLET | Freq: Every day | ORAL | 3 refills | Status: DC

## 2022-04-14 MED ORDER — ALBUTEROL SULFATE HFA 108 (90 BASE) MCG/ACT IN AERS: 2.0000 | INHALATION_SPRAY | Freq: Four times a day (QID) | RESPIRATORY_TRACT | 1 refills | Status: DC | PRN

## 2022-04-15 ENCOUNTER — Encounter: Payer: Self-pay | Admitting: Family Medicine

## 2022-04-15 NOTE — Progress Notes (Signed)
Established Patient Office Visit  Subjective    Patient ID: [name], female    DOB: [birth year]  Age: 61 y.o. MRN: [id number]  CC:  Chief Complaint  Patient presents with   Follow-up    HPI [name] presents with complaint of GI sx of n/v/d for about 10 days. Patient reports improvement. Denies fever/chills or viral sx. Reports she was unable to take meds as recommended during that period of time. She denies known contact or exposures. Patient also reports some increased wheezing with outside activities.    Outpatient Encounter Medications as of 04/14/2022  Medication Sig   albuterol (VENTOLIN HFA) 108 (90 Base) MCG/ACT inhaler Inhale 2 puffs into the lungs every 6 (six) hours as needed for wheezing or shortness of breath.   amLODipine (NORVASC) 10 MG tablet Take 1 tablet (10 mg total) by mouth daily.   aspirin EC 81 MG tablet Take 1 tablet (81 mg total) by mouth 2 (two) times daily.   atorvastatin (LIPITOR) 40 MG tablet Take 1 tablet (40 mg total) by mouth daily.   benzonatate (TESSALON) 100 MG capsule Take 1 capsule (100 mg total) by mouth 2 (two) times daily as needed for cough.   bismuth subsalicylate (PEPTO BISMOL) 262 MG/15ML suspension Take 30 mLs by mouth every 6 (six) hours as needed for indigestion.   budesonide-formoterol (SYMBICORT) 160-4.5 MCG/ACT inhaler INHALE 2 PUFFS INTO THE LUNGS TWICE DAILY   busPIRone (BUSPAR) 10 MG tablet Take 1 tablet (10 mg total) by mouth 3 (three) times daily.   cefdinir (OMNICEF) 300 MG capsule Take 300 mg by mouth 2 (two) times daily.   diphenhydrAMINE HCl, Sleep, (UNISOM SLEEPMELTS) 25 MG TBDP Take 1 tablet (25 mg total) by mouth at bedtime as needed.   fluticasone furoate-vilanterol (BREO ELLIPTA) 100-25 MCG/INH AEPB Inhale 1 puff into the lungs daily. (Patient not taking: Reported on 11/27/2021)   folic acid (FOLVITE) 1 MG tablet TAKE 1 TABLET(1 MG) BY MOUTH DAILY   gabapentin (NEURONTIN) 300 MG capsule TAKE 1 CAPSULE(300 MG) BY  MOUTH THREE TIMES DAILY   guaiFENesin (MUCINEX) 600 MG 12 hr tablet Take 1 tablet (600 mg total) by mouth 2 (two) times daily. (Patient taking differently: Take 600 mg by mouth 2 (two) times daily as needed for cough.)   hydrOXYzine (ATARAX) 50 MG tablet TAKE 1 TABLET (50 MG TOTAL) BY MOUTH 3 (THREE) TIMES DAILY AS NEEDED FOR ANXIETY.   ibuprofen (ADVIL) 200 MG tablet Take 600 mg by mouth daily as needed for moderate pain.   losartan (COZAAR) 100 MG tablet TAKE 1 TABLET(100 MG) BY MOUTH DAILY   methocarbamol (ROBAXIN) 500 MG tablet Take 500 mg by mouth every 6 (six) hours as needed.   Multiple Vitamin (MULTIVITAMIN WITH MINERALS) TABS tablet Take 1 tablet by mouth daily.   oxybutynin (DITROPAN XL) 15 MG 24 hr tablet TAKE 1 TABLET(15 MG) BY MOUTH AT BEDTIME   OXYGEN Inhale 4 L into the lungs at bedtime.   pantoprazole (PROTONIX) 40 MG tablet TAKE 1 TABLET(40 MG) BY MOUTH TWICE DAILY (Patient taking differently: Take 40 mg by mouth 2 (two) times daily.)   POTASSIUM PO Take 1 tablet by mouth daily.   promethazine (PHENERGAN) 25 MG tablet Take 25 mg by mouth 3 (three) times daily.   QUEtiapine (SEROQUEL) 300 MG tablet TAKE 1 TABLET(300 MG) BY MOUTH AT BEDTIME   sertraline (ZOLOFT) 100 MG tablet Take 2 tablets (200 mg total) by mouth daily.   tizanidine (ZANAFLEX) 2 MG capsule  Take 2 capsules (4 mg total) by mouth 3 (three) times daily as needed for muscle spasms. (Patient not taking: Reported on 11/27/2021)   tiZANidine (ZANAFLEX) 4 MG tablet Take 4 mg by mouth every 6 (six) hours as needed for muscle spasms.   traMADol (ULTRAM) 50 MG tablet Take 50 mg by mouth every 6 (six) hours as needed for severe pain.   VITAMIN D PO Take 1 capsule by mouth daily.   [DISCONTINUED] amLODipine (NORVASC) 10 MG tablet TAKE 1 TABLET(10 MG) BY MOUTH DAILY   No facility-administered encounter medications on file as of 04/14/2022.    Past Medical History:  Diagnosis Date   Anxiety    Arthritis    COPD (chronic  obstructive pulmonary disease) (HCC)    uses oxygen 4L at nite   Depression    Dyspnea    GERD (gastroesophageal reflux disease)    Hypertension    Peripheral edema    2+ at PAT visit    Past Surgical History:  Procedure Laterality Date   ABDOMINAL HYSTERECTOMY  1986   CARPAL TUNNEL RELEASE Bilateral 1980   Per patient   SHOULDER SURGERY Left 1980   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   Procedure: RIGHT TOTAL HIP ARTHROPLASTY ANTERIOR APPROACH;  Surgeon: Gean Birchwood, MD;  Location: [facility];  Service: Orthopedics;  Laterality: Right;   TOTAL HIP ARTHROPLASTY Left 11/11/2021   Procedure: LEFT TOTAL HIP ARTHROPLASTY ANTERIOR APPROACH;  Surgeon: Gean Birchwood, MD;  Location: [facility];  Service: Orthopedics;  Laterality: Left;    Family History  Problem Relation Age of Onset   Ulcers Father    Hypotension Sister    Hypertension Brother     Social History   Socioeconomic History   Marital status: Divorced    Spouse name: Not on file   Number of children: 0   Years of education: 12   Highest education level: GED or equivalent  Occupational History   Occupation: Armed forces operational officer  Tobacco Use   Smoking status: Every Day    Packs/day: 1.00    Years: 35.00    Total pack years: 35.00    Types: Cigarettes    Start date: 08/04/1978   Smokeless tobacco: Never  Vaping Use   Vaping Use: Never used  Substance and Sexual Activity   Alcohol use: Never   Drug use: Never   Sexual activity: Never  Other Topics Concern   Not on file  Social History Narrative   Lives and step-dad, they are able to help.   Social Determinants of Health   Financial Resource Strain: Not on file  Food Insecurity: Not on file  Transportation Needs: Not on file  Physical Activity: Not on file  Stress: Not on file  Social Connections: Not on file  Intimate Partner Violence: Not on file    Review of Systems  Gastrointestinal:  Positive for diarrhea, nausea and vomiting. Negative for abdominal pain,  blood in stool and constipation.  All other systems reviewed and are negative.       Objective    BP (!) 150/76   Pulse 100   Temp 98.1 F (36.7 C) (Oral)   Resp 18   Wt 208 lb (94.3 kg)   SpO2 92%   BMI 38.04 kg/m   Physical Exam Vitals and nursing note reviewed.  Constitutional:      General: She is not in acute distress.    Appearance: She is obese.  Cardiovascular:     Rate and Rhythm: Normal rate  and regular rhythm.  Pulmonary:     Effort: Pulmonary effort is normal.     Breath sounds: Normal breath sounds.  Abdominal:     Palpations: Abdomen is soft.     Tenderness: There is no abdominal tenderness.  Musculoskeletal:     Right lower leg: No edema.     Left lower leg: No edema.  Neurological:     General: No focal deficit present.     Mental Status: She is alert and oriented to person, place, and time.         Assessment & Plan:   1. Essential hypertension Elevated readings. ?2/2 inability to be compliant with GI sx. Will continue present management and monitor.  - amLODipine (NORVASC) 10 MG tablet; Take 1 tablet (10 mg total) by mouth daily.  Dispense: 30 tablet; Refill: 3  2. Moderate persistent reactive airway disease without complication Albuterol MDI prescribed  3. Noninfectious gastroenteritis, unspecified type ?Resolving. Patient defers further eval/mgt. Recommended adequate fluids.   Return in about 3 months (around 07/14/2022) for follow up.   Tommie Raymond, MD

## 2022-04-19 ENCOUNTER — Other Ambulatory Visit (HOSPITAL_COMMUNITY): Payer: Self-pay | Admitting: Psychiatry

## 2022-04-19 DIAGNOSIS — F411 Generalized anxiety disorder: Secondary | ICD-10-CM

## 2022-04-19 DIAGNOSIS — F32A Depression, unspecified: Secondary | ICD-10-CM

## 2022-04-22 ENCOUNTER — Ambulatory Visit: Payer: Self-pay

## 2022-04-22 ENCOUNTER — Telehealth: Payer: Medicare Other | Admitting: Physician Assistant

## 2022-04-22 DIAGNOSIS — J441 Chronic obstructive pulmonary disease with (acute) exacerbation: Secondary | ICD-10-CM | POA: Diagnosis not present

## 2022-04-22 DIAGNOSIS — J208 Acute bronchitis due to other specified organisms: Secondary | ICD-10-CM

## 2022-04-22 DIAGNOSIS — B9689 Other specified bacterial agents as the cause of diseases classified elsewhere: Secondary | ICD-10-CM

## 2022-04-22 MED ORDER — PREDNISONE 20 MG PO TABS: 40.0000 mg | ORAL_TABLET | Freq: Every day | ORAL | 0 refills | Status: DC

## 2022-04-22 MED ORDER — DOXYCYCLINE HYCLATE 100 MG PO TABS: 100.0000 mg | ORAL_TABLET | Freq: Two times a day (BID) | ORAL | 0 refills | Status: DC

## 2022-04-22 MED ORDER — BENZONATATE 100 MG PO CAPS: 100.0000 mg | ORAL_CAPSULE | Freq: Three times a day (TID) | ORAL | 0 refills | Status: DC | PRN

## 2022-04-22 NOTE — Patient Instructions (Signed)
[name], thank you for joining Piedad Climes, PA-C for today's virtual visit.  While this provider is not your primary care provider (PCP), if your PCP is located in our provider database this encounter information will be shared with them immediately following your visit.  Consent: (Patient) [name] provided verbal consent for this virtual visit at the beginning of the encounter.  Current Medications:  Current Outpatient Medications:    albuterol (VENTOLIN HFA) 108 (90 Base) MCG/ACT inhaler, Inhale 2 puffs into the lungs every 6 (six) hours as needed for wheezing or shortness of breath., Disp: 18 g, Rfl: 1   amLODipine (NORVASC) 10 MG tablet, Take 1 tablet (10 mg total) by mouth daily., Disp: 30 tablet, Rfl: 3   aspirin EC 81 MG tablet, Take 1 tablet (81 mg total) by mouth 2 (two) times daily., Disp: 60 tablet, Rfl: 0   atorvastatin (LIPITOR) 40 MG tablet, Take 1 tablet (40 mg total) by mouth daily., Disp: 90 tablet, Rfl: 3   benzonatate (TESSALON) 100 MG capsule, Take 1 capsule (100 mg total) by mouth 2 (two) times daily as needed for cough., Disp: 20 capsule, Rfl: 0   bismuth subsalicylate (PEPTO BISMOL) 262 MG/15ML suspension, Take 30 mLs by mouth every 6 (six) hours as needed for indigestion., Disp: , Rfl:    budesonide-formoterol (SYMBICORT) 160-4.5 MCG/ACT inhaler, INHALE 2 PUFFS INTO THE LUNGS TWICE DAILY, Disp: 10.2 g, Rfl: 0   busPIRone (BUSPAR) 10 MG tablet, Take 1 tablet (10 mg total) by mouth 3 (three) times daily., Disp: 90 tablet, Rfl: 3   cefdinir (OMNICEF) 300 MG capsule, Take 300 mg by mouth 2 (two) times daily., Disp: , Rfl:    diphenhydrAMINE HCl, Sleep, (UNISOM SLEEPMELTS) 25 MG TBDP, Take 1 tablet (25 mg total) by mouth at bedtime as needed., Disp: 30 tablet, Rfl: 3   fluticasone furoate-vilanterol (BREO ELLIPTA) 100-25 MCG/INH AEPB, Inhale 1 puff into the lungs daily. (Patient not taking: Reported on 11/27/2021), Disp: 60 each, Rfl: 11   folic acid (FOLVITE)  1 MG tablet, TAKE 1 TABLET(1 MG) BY MOUTH DAILY, Disp: 90 tablet, Rfl: 2   gabapentin (NEURONTIN) 300 MG capsule, TAKE 1 CAPSULE(300 MG) BY MOUTH THREE TIMES DAILY, Disp: 90 capsule, Rfl: 2   guaiFENesin (MUCINEX) 600 MG 12 hr tablet, Take 1 tablet (600 mg total) by mouth 2 (two) times daily. (Patient taking differently: Take 600 mg by mouth 2 (two) times daily as needed for cough.), Disp: 60 tablet, Rfl: 2   hydrOXYzine (ATARAX) 50 MG tablet, TAKE 1 TABLET (50 MG TOTAL) BY MOUTH 3 (THREE) TIMES DAILY AS NEEDED FOR ANXIETY., Disp: 90 tablet, Rfl: 3   ibuprofen (ADVIL) 200 MG tablet, Take 600 mg by mouth daily as needed for moderate pain., Disp: , Rfl:    losartan (COZAAR) 100 MG tablet, TAKE 1 TABLET(100 MG) BY MOUTH DAILY, Disp: 90 tablet, Rfl: 0   methocarbamol (ROBAXIN) 500 MG tablet, Take 500 mg by mouth every 6 (six) hours as needed., Disp: , Rfl:    Multiple Vitamin (MULTIVITAMIN WITH MINERALS) TABS tablet, Take 1 tablet by mouth daily., Disp: , Rfl:    oxybutynin (DITROPAN XL) 15 MG 24 hr tablet, TAKE 1 TABLET(15 MG) BY MOUTH AT BEDTIME, Disp: 90 tablet, Rfl: 0   OXYGEN, Inhale 4 L into the lungs at bedtime., Disp: , Rfl:    pantoprazole (PROTONIX) 40 MG tablet, TAKE 1 TABLET(40 MG) BY MOUTH TWICE DAILY (Patient taking differently: Take 40 mg by mouth 2 (two)  times daily.), Disp: 180 tablet, Rfl: 0   POTASSIUM PO, Take 1 tablet by mouth daily., Disp: , Rfl:    promethazine (PHENERGAN) 25 MG tablet, Take 25 mg by mouth 3 (three) times daily., Disp: , Rfl:    QUEtiapine (SEROQUEL) 300 MG tablet, TAKE 1 TABLET(300 MG) BY MOUTH AT BEDTIME, Disp: 30 tablet, Rfl: 3   sertraline (ZOLOFT) 100 MG tablet, Take 2 tablets (200 mg total) by mouth daily., Disp: 60 tablet, Rfl: 3   tizanidine (ZANAFLEX) 2 MG capsule, Take 2 capsules (4 mg total) by mouth 3 (three) times daily as needed for muscle spasms. (Patient not taking: Reported on 11/27/2021), Disp: 60 capsule, Rfl: 0   tiZANidine (ZANAFLEX) 4 MG  tablet, Take 4 mg by mouth every 6 (six) hours as needed for muscle spasms., Disp: , Rfl:    traMADol (ULTRAM) 50 MG tablet, Take 50 mg by mouth every 6 (six) hours as needed for severe pain., Disp: , Rfl:    VITAMIN D PO, Take 1 capsule by mouth daily., Disp: , Rfl:    Medications ordered in this encounter:  No orders of the defined types were placed in this encounter.    *If you need refills on other medications prior to your next appointment, please contact your pharmacy*  Follow-Up: Call back or seek an in-person evaluation if the symptoms worsen or if the condition fails to improve as anticipated.  Other Instructions Again please reconsider taking a home COVID test to be cautious.   Take antibiotic (Doxycycline) as directed.  Increase fluids.  Get plenty of rest. Use Mucinex for congestion. Use the prednisone and tessalon as directed. Continue your routine COPD medications. Take a daily probiotic (I recommend Align or Culturelle, but even Activia Yogurt may be beneficial).  A humidifier placed in the bedroom may offer some relief for a dry, scratchy throat of nasal irritation.  Read information below on acute bronchitis. Please call or return to clinic if symptoms are not improving.  Acute Bronchitis Bronchitis is when the airways that extend from the windpipe into the lungs get red, puffy, and painful (inflamed). Bronchitis often causes thick spit (mucus) to develop. This leads to a cough. A cough is the most common symptom of bronchitis. In acute bronchitis, the condition usually begins suddenly and goes away over time (usually in 2 weeks). Smoking, allergies, and asthma can make bronchitis worse. Repeated episodes of bronchitis may cause more lung problems.  HOME CARE Rest. Drink enough fluids to keep your pee (urine) clear or pale yellow (unless you need to limit fluids as told by your doctor). Only take over-the-counter or prescription medicines as told by your doctor. Avoid  smoking and secondhand smoke. These can make bronchitis worse. If you are a smoker, think about using nicotine gum or skin patches. Quitting smoking will help your lungs heal faster. Reduce the chance of getting bronchitis again by: Washing your hands often. Avoiding people with cold symptoms. Trying not to touch your hands to your mouth, nose, or eyes. Follow up with your doctor as told.  GET HELP IF: Your symptoms do not improve after 1 week of treatment. Symptoms include: Cough. Fever. Coughing up thick spit. Body aches. Chest congestion. Chills. Shortness of breath. Sore throat.  GET HELP RIGHT AWAY IF:  You have an increased fever. You have chills. You have severe shortness of breath. You have bloody thick spit (sputum). You throw up (vomit) often. You lose too much body fluid (dehydration). You have a severe  headache. You faint.  MAKE SURE YOU:  Understand these instructions. Will watch your condition. Will get help right away if you are not doing well or get worse. Document Released: 01/07/2008 Document Revised: 03/23/2013 Document Reviewed: 01/11/2013 [facility] Patient Information 2015 [place], [place]. This information is not intended to replace advice given to you by your health care provider. Make sure you discuss any questions you have with your health care provider.    If you have been instructed to have an in-person evaluation today at a local Urgent Care facility, please use the link below. It will take you to a list of all of our available [facility], including address, phone number and hours of operation. Please do not delay care.  [facility]  If you or a family member do not have a primary care provider, use the link below to schedule a visit and establish care. When you choose a [facility] primary care physician or advanced practice provider, you gain a long-term partner in health. Find a Primary Care Provider  Learn more about  [facility] in-office and virtual care options: [facility] - Get Care Now

## 2022-04-22 NOTE — Telephone Encounter (Signed)
Summary: coughing / congestion   Patient experiencing chest congestion and coughing for 3 days. Requesting antibiotics. No available appointments with PCP      Chief Complaint: prod. Cough, wheezing, h/o COPD Symptoms: prod. Cough with yellow secretions, frequent congested cough with yellow phlegm, cold chills and upper chest pain, moderate diff. breathing Frequency: 2  days ago  Pertinent Negatives: Patient denies coughing up blood, runny nose Disposition: [] ED /[] Urgent Care (no appt availability in office) / [] Appointment(In office/virtual)/ [x]  [facility]/ [] Home Care/ [] Refused Recommended Disposition /[] [facility]/ []  Follow-up with PCP Additional Notes: no appts- pt wants VV. Assisted pt to female 68 appt today.  Reason for Disposition  [1] Known COPD or other severe lung disease (i.e., bronchiectasis, cystic fibrosis, lung surgery) AND [2] worsening symptoms (i.e., increased sputum purulence or amount, increased breathing difficulty  Answer Assessment - Initial Assessment Questions 1. ONSET: "When did the cough begin?"      2 days ago  2. SEVERITY: "How bad is the cough today?"      Frequent coughing with talking 3. SPUTUM: "Describe the color of your sputum" (none, dry cough; clear, white, yellow, green)     yellow 4. HEMOPTYSIS: "Are you coughing up any blood?" If so ask: "How much?" (flecks, streaks, tablespoons, etc.)     no 5. DIFFICULTY BREATHING: "Are you having difficulty breathing?" If Yes, ask: "How bad is it?" (e.g., mild, moderate, severe)    - MILD: No SOB at rest, mild SOB with walking, speaks normally in sentences, can lie down, no retractions, pulse < 100.    - MODERATE: SOB at rest, SOB with minimal exertion and prefers to sit, cannot lie down flat, speaks in phrases, mild retractions, audible wheezing, pulse 100-120.    - SEVERE: Very SOB at rest, speaks in single words, struggling to breathe, sitting hunched forward, retractions, pulse >  120      moderate 6. FEVER: "Do you have a fever?" If Yes, ask: "What is your temperature, how was it measured, and when did it start?"     Cold chills, muscle aches 7. CARDIAC HISTORY: "Do you have any history of heart disease?" (e.g., heart attack, congestive heart failure)      HTN 8. LUNG HISTORY: "Do you have any history of lung disease?"  (e.g., pulmonary embolus, asthma, emphysema)     COPD 9. PE RISK FACTORS: "Do you have a history of blood clots?" (or: recent major surgery, recent prolonged travel, bedridden)     no 10. OTHER SYMPTOMS: "Do you have any other symptoms?" (e.g., runny nose, wheezing, chest pain)       Top of chest discomfort with coughing,wheezing, muscles aches chills- not sleeping 11. PREGNANCY: "Is there any chance you are pregnant?" "When was your last menstrual period?"       N/a 12. TRAVEL: "Have you traveled out of the country in the last month?" (e.g., travel history, exposures)       N/a  Protocols used: Cough - Acute Productive-A-AH

## 2022-04-22 NOTE — Progress Notes (Signed)
Virtual Visit Consent   [name], you are scheduled for a virtual visit with a [facility] provider today. Just as with appointments in the office, your consent must be obtained to participate. Your consent will be active for this visit and any virtual visit you may have with one of our providers in the next 365 days. If you have a MyChart account, a copy of this consent can be sent to you electronically.  As this is a virtual visit, video technology does not allow for your provider to perform a traditional examination. This may limit your provider's ability to fully assess your condition. If your provider identifies any concerns that need to be evaluated in person or the need to arrange testing (such as labs, EKG, etc.), we will make arrangements to do so. Although advances in technology are sophisticated, we cannot ensure that it will always work on either your end or our end. If the connection with a video visit is poor, the visit may have to be switched to a telephone visit. With either a video or telephone visit, we are not always able to ensure that we have a secure connection.  By engaging in this virtual visit, you consent to the provision of healthcare and authorize for your insurance to be billed (if applicable) for the services provided during this visit. Depending on your insurance coverage, you may receive a charge related to this service.  I need to obtain your verbal consent now. Are you willing to proceed with your visit today? [name] has provided verbal consent on 04/22/2022 for a virtual visit (video or telephone). [name], [place]  Date: 04/22/2022 9:18 AM  Virtual Visit via Video Note   I, [name], connected with  [name]  ([id number], [birth year]) on 04/22/22 at  9:15 AM EDT by a video-enabled telemedicine application and verified that I am speaking with the correct person using two identifiers.  Location: Patient: Virtual Visit Location Patient:  Home Provider: Virtual Visit Location Provider: Home Office   I discussed the limitations of evaluation and management by telemedicine and the availability of in person appointments. The patient expressed understanding and agreed to proceed.    History of Present Illness: [name] is a 61 y.o. who identifies as a female who was assigned female at birth, and is being seen today for increase in her COPD symptoms over past few days along with increased. chest congestion and cough productive of thick yellow phlegm. Unsure of fever. Some increased work of breathing. Headache. Denies recent travel or sick contact.   Is taking Mucinex OTC.  Has not taken a home COVID test.    HPI: HPI  Problems:  Patient Active Problem List   Diagnosis Date Noted   Sepsis due to pneumonia (HCC) 11/17/2021   Status post hip replacement, left 11/12/2021   Status post total hip replacement, left 11/11/2021   Osteoarthritis of left hip 11/06/2021   Hypoxia    Hypokalemia    Wheezing    HCAP (healthcare-associated pneumonia) 12/31/2020   History of total hip arthroplasty, right 12/24/2020   Type 2 diabetes mellitus (HCC) 12/18/2020   Avascular necrosis of bone of right hip (HCC) 09/17/2020   Greater trochanteric pain syndrome of left lower extremity 07/24/2020   Rheumatoid factor positive 06/26/2020   CRP elevated 06/26/2020   Bilateral hand pain 06/26/2020   Stasis dermatitis of both legs 06/26/2020   Alcohol use disorder, severe, in sustained remission (HCC) 04/30/2020   Chronic sinusitis  04/10/2020   Liver cirrhosis (HCC) 03/08/2020   Major depressive disorder, recurrent episode, moderate with anxious distress (HCC) 02/11/2020   Alcohol abuse, in remission 12/16/2019   Nicotine dependence, cigarettes, uncomplicated 12/16/2019   Generalized anxiety disorder 12/16/2019    Allergies:  Allergies  Allergen Reactions   Amoxicillin     Yeast infection ; tolerated Cefepime   Medications:  Current  Outpatient Medications:    benzonatate (TESSALON) 100 MG capsule, Take 1 capsule (100 mg total) by mouth 3 (three) times daily as needed for cough., Disp: 30 capsule, Rfl: 0   doxycycline (VIBRA-TABS) 100 MG tablet, Take 1 tablet (100 mg total) by mouth 2 (two) times daily., Disp: 14 tablet, Rfl: 0   predniSONE (DELTASONE) 20 MG tablet, Take 2 tablets (40 mg total) by mouth daily with breakfast., Disp: 10 tablet, Rfl: 0   albuterol (VENTOLIN HFA) 108 (90 Base) MCG/ACT inhaler, Inhale 2 puffs into the lungs every 6 (six) hours as needed for wheezing or shortness of breath., Disp: 18 g, Rfl: 1   amLODipine (NORVASC) 10 MG tablet, Take 1 tablet (10 mg total) by mouth daily., Disp: 30 tablet, Rfl: 3   aspirin EC 81 MG tablet, Take 1 tablet (81 mg total) by mouth 2 (two) times daily., Disp: 60 tablet, Rfl: 0   atorvastatin (LIPITOR) 40 MG tablet, Take 1 tablet (40 mg total) by mouth daily., Disp: 90 tablet, Rfl: 3   bismuth subsalicylate (PEPTO BISMOL) 262 MG/15ML suspension, Take 30 mLs by mouth every 6 (six) hours as needed for indigestion., Disp: , Rfl:    budesonide-formoterol (SYMBICORT) 160-4.5 MCG/ACT inhaler, INHALE 2 PUFFS INTO THE LUNGS TWICE DAILY, Disp: 10.2 g, Rfl: 0   busPIRone (BUSPAR) 10 MG tablet, Take 1 tablet (10 mg total) by mouth 3 (three) times daily., Disp: 90 tablet, Rfl: 3   cefdinir (OMNICEF) 300 MG capsule, Take 300 mg by mouth 2 (two) times daily., Disp: , Rfl:    diphenhydrAMINE HCl, Sleep, (UNISOM SLEEPMELTS) 25 MG TBDP, Take 1 tablet (25 mg total) by mouth at bedtime as needed., Disp: 30 tablet, Rfl: 3   fluticasone furoate-vilanterol (BREO ELLIPTA) 100-25 MCG/INH AEPB, Inhale 1 puff into the lungs daily. (Patient not taking: Reported on 11/27/2021), Disp: 60 each, Rfl: 11   folic acid (FOLVITE) 1 MG tablet, TAKE 1 TABLET(1 MG) BY MOUTH DAILY, Disp: 90 tablet, Rfl: 2   gabapentin (NEURONTIN) 300 MG capsule, TAKE 1 CAPSULE(300 MG) BY MOUTH THREE TIMES DAILY, Disp: 90 capsule,  Rfl: 2   guaiFENesin (MUCINEX) 600 MG 12 hr tablet, Take 1 tablet (600 mg total) by mouth 2 (two) times daily. (Patient taking differently: Take 600 mg by mouth 2 (two) times daily as needed for cough.), Disp: 60 tablet, Rfl: 2   hydrOXYzine (ATARAX) 50 MG tablet, TAKE 1 TABLET (50 MG TOTAL) BY MOUTH 3 (THREE) TIMES DAILY AS NEEDED FOR ANXIETY., Disp: 90 tablet, Rfl: 3   ibuprofen (ADVIL) 200 MG tablet, Take 600 mg by mouth daily as needed for moderate pain., Disp: , Rfl:    losartan (COZAAR) 100 MG tablet, TAKE 1 TABLET(100 MG) BY MOUTH DAILY, Disp: 90 tablet, Rfl: 0   methocarbamol (ROBAXIN) 500 MG tablet, Take 500 mg by mouth every 6 (six) hours as needed., Disp: , Rfl:    Multiple Vitamin (MULTIVITAMIN WITH MINERALS) TABS tablet, Take 1 tablet by mouth daily., Disp: , Rfl:    oxybutynin (DITROPAN XL) 15 MG 24 hr tablet, TAKE 1 TABLET(15 MG) BY MOUTH AT BEDTIME,  Disp: 90 tablet, Rfl: 0   OXYGEN, Inhale 4 L into the lungs at bedtime., Disp: , Rfl:    pantoprazole (PROTONIX) 40 MG tablet, TAKE 1 TABLET(40 MG) BY MOUTH TWICE DAILY (Patient taking differently: Take 40 mg by mouth 2 (two) times daily.), Disp: 180 tablet, Rfl: 0   POTASSIUM PO, Take 1 tablet by mouth daily., Disp: , Rfl:    promethazine (PHENERGAN) 25 MG tablet, Take 25 mg by mouth 3 (three) times daily., Disp: , Rfl:    QUEtiapine (SEROQUEL) 300 MG tablet, TAKE 1 TABLET(300 MG) BY MOUTH AT BEDTIME, Disp: 30 tablet, Rfl: 3   sertraline (ZOLOFT) 100 MG tablet, Take 2 tablets (200 mg total) by mouth daily., Disp: 60 tablet, Rfl: 3   tizanidine (ZANAFLEX) 2 MG capsule, Take 2 capsules (4 mg total) by mouth 3 (three) times daily as needed for muscle spasms. (Patient not taking: Reported on 11/27/2021), Disp: 60 capsule, Rfl: 0   tiZANidine (ZANAFLEX) 4 MG tablet, Take 4 mg by mouth every 6 (six) hours as needed for muscle spasms., Disp: , Rfl:    traMADol (ULTRAM) 50 MG tablet, Take 50 mg by mouth every 6 (six) hours as needed for severe  pain., Disp: , Rfl:    VITAMIN D PO, Take 1 capsule by mouth daily., Disp: , Rfl:   Observations/Objective: Patient is well-developed, well-nourished in no acute distress.  Resting comfortably at home.  Head is normocephalic, atraumatic.  No labored breathing. Speech is clear and coherent with logical content.  Patient is alert and oriented at baseline.   Assessment and Plan: 1. COPD with acute exacerbation (HCC) - benzonatate (TESSALON) 100 MG capsule; Take 1 capsule (100 mg total) by mouth 3 (three) times daily as needed for cough.  Dispense: 30 capsule; Refill: 0 - predniSONE (DELTASONE) 20 MG tablet; Take 2 tablets (40 mg total) by mouth daily with breakfast.  Dispense: 10 tablet; Refill: 0  2. Acute bacterial bronchitis - doxycycline (VIBRA-TABS) 100 MG tablet; Take 1 tablet (100 mg total) by mouth 2 (two) times daily.  Dispense: 14 tablet; Refill: 0 - benzonatate (TESSALON) 100 MG capsule; Take 1 capsule (100 mg total) by mouth 3 (three) times daily as needed for cough.  Dispense: 30 capsule; Refill: 0 - predniSONE (DELTASONE) 20 MG tablet; Take 2 tablets (40 mg total) by mouth daily with breakfast.  Dispense: 10 tablet; Refill: 0  Recommend home COVID testing just to be cautious giving health history and need for antivirals if positive. She declines at present but has been urged to reconsider. Giving clear COPD exacerbation and change in sputum, will Rx Prednisone burst and Doxycycline.  Increase fluids.  Rest.  Saline nasal spray.  Probiotic.  Mucinex as directed.  Humidifier in bedroom. Tessalon per orders.  Call or return to clinic if symptoms are not improving.   Follow Up Instructions: I discussed the assessment and treatment plan with the patient. The patient was provided an opportunity to ask questions and all were answered. The patient agreed with the plan and demonstrated an understanding of the instructions.  A copy of instructions were sent to the patient via MyChart unless  otherwise noted below.   The patient was advised to call back or seek an in-person evaluation if the symptoms worsen or if the condition fails to improve as anticipated.  Time:  I spent 10 minutes with the patient via telehealth technology discussing the above problems/concerns.    Leeanne Rio, PA-C

## 2022-04-23 ENCOUNTER — Other Ambulatory Visit: Payer: Self-pay | Admitting: Family Medicine

## 2022-04-23 NOTE — Telephone Encounter (Signed)
Requested Prescriptions  Pending Prescriptions Disp Refills  . budesonide-formoterol (SYMBICORT) 160-4.5 MCG/ACT inhaler [Pharmacy Med Name: BUDESONIDE/FORM 160/4.5MCG(120 INH)] 30.6 g 0    Sig: INHALE 2 PUFFS INTO THE LUNGS TWICE DAILY     Pulmonology:  Combination Products Passed - 04/23/2022  9:33 AM      Passed - Valid encounter within last 12 months    Recent Outpatient Visits          1 week ago Essential hypertension   [facility] at [facility], [place]   5 months ago Pre-op evaluation   [facility] at [facility], [place]   8 months ago Acute bronchitis, unspecified organism   Primary Care at [facility], [place]   11 months ago Uncontrolled hypertension   Primary Care at [facility], [place]   1 year ago Uncontrolled hypertension   Primary Care at [facility], [place]

## 2022-04-30 ENCOUNTER — Other Ambulatory Visit: Payer: Self-pay | Admitting: Family Medicine

## 2022-05-01 ENCOUNTER — Ambulatory Visit: Payer: Self-pay | Admitting: *Deleted

## 2022-05-01 NOTE — Telephone Encounter (Signed)
  Chief Complaint: Seen via a video visit and given Doxycycline, prednisone and Tessalon for coughing.   She is requeting a refill on these 3.    Symptoms: She is better but still with a little chest congestion Frequency: Now Pertinent Negatives: Patient denies green mucus Disposition: [] ED /[] Urgent Care (no appt availability in office) / [] Appointment(In office/virtual)/ []  [facility]/ [] Home Care/ [] Refused Recommended Disposition /[] [facility]/ [x]  Follow-up with PCP Additional Notes: Request sent to Dr. Redmond Pulling for consideration  for the requested refills.

## 2022-05-01 NOTE — Telephone Encounter (Signed)
Reason for Disposition  Continuous (nonstop) coughing interferes with work, school, or sleeping    Requesting refills  Answer Assessment - Initial Assessment Questions 1. RESPIRATORY STATUS: "Describe your breathing?" (e.g., wheezing, shortness of breath, unable to speak, severe coughing)      Raiford Noble gave me medicine for chest congestion via video visit. I'm having congestion in my chest.   I'm better but I need more of the antibiotic and medicine I was given during the video visit. Doxycycline 100 mg 1 tab twice a day.      Prednisone 20 mg take 2 tabs (40 mg) daily with breakfast.    Benzonatate 100 mg 3 times a day for coughing.    2. ONSET: "When did this breathing problem begin?"      I'm getting a little bit of yellow mucus up.   I'm sore from coughing but I'm much better.   I feel I just need a little more medicine to get me better.    3. PATTERN "Does the difficult breathing come and go, or has it been constant since it started?"      A little short of breath and coughing.  I was getting up more mucus but not as much now. 4. SEVERITY: "How bad is your breathing?" (e.g., mild, moderate, severe)    - MILD: No SOB at rest, mild SOB with walking, speaks normally in sentences, can lie down, no retractions, pulse < 100.    - MODERATE: SOB at rest, SOB with minimal exertion and prefers to sit, cannot lie down flat, speaks in phrases, mild retractions, audible wheezing, pulse 100-120.    - SEVERE: Very SOB at rest, speaks in single words, struggling to breathe, sitting hunched forward, retractions, pulse > 120      Mild 5. RECURRENT SYMPTOM: "Have you had difficulty breathing before?" If Yes, ask: "When was the last time?" and "What happened that time?"      Not asked since been evaluated via a video visit 6. CARDIAC HISTORY: "Do you have any history of heart disease?" (e.g., heart attack, angina, bypass surgery, angioplasty)      Not askd 7. LUNG HISTORY: "Do you have any history of  lung disease?"  (e.g., pulmonary embolus, asthma, emphysema)     Not asked 8. CAUSE: "What do you think is causing the breathing problem?"      Upper respiratory infection 9. OTHER SYMPTOMS: "Do you have any other symptoms? (e.g., dizziness, runny nose, cough, chest pain, fever)     Congestion in my chest is much better. 10. O2 SATURATION MONITOR:  "Do you use an oxygen saturation monitor (pulse oximeter) at home?" If Yes, ask: "What is your reading (oxygen level) today?" "What is your usual oxygen saturation reading?" (e.g., 95%)       N/A 11. PREGNANCY: "Is there any chance you are pregnant?" "When was your last menstrual period?"       N/A 12. TRAVEL: "Have you traveled out of the country in the last month?" (e.g., travel history, exposures)       N/A  Protocols used: Breathing Difficulty-A-AH

## 2022-05-02 NOTE — Telephone Encounter (Signed)
Patient was called to come in and get CXR before more medication could be prescribe. Patient has been schedule

## 2022-05-05 ENCOUNTER — Ambulatory Visit: Payer: Medicare Other

## 2022-05-05 ENCOUNTER — Ambulatory Visit (INDEPENDENT_AMBULATORY_CARE_PROVIDER_SITE_OTHER): Payer: Medicare Other

## 2022-05-05 DIAGNOSIS — R059 Cough, unspecified: Secondary | ICD-10-CM | POA: Diagnosis not present

## 2022-05-09 ENCOUNTER — Other Ambulatory Visit: Payer: Self-pay | Admitting: *Deleted

## 2022-05-09 ENCOUNTER — Ambulatory Visit: Payer: Self-pay | Admitting: *Deleted

## 2022-05-09 ENCOUNTER — Other Ambulatory Visit: Payer: Self-pay | Admitting: Family Medicine

## 2022-05-09 DIAGNOSIS — J441 Chronic obstructive pulmonary disease with (acute) exacerbation: Secondary | ICD-10-CM

## 2022-05-09 DIAGNOSIS — B9689 Other specified bacterial agents as the cause of diseases classified elsewhere: Secondary | ICD-10-CM

## 2022-05-09 MED ORDER — AZITHROMYCIN 250 MG PO TABS: ORAL_TABLET | ORAL | 0 refills | Status: AC

## 2022-05-09 NOTE — Telephone Encounter (Signed)
Requested medication (s) are due for refill today: na   Requested medication (s) are on the active medication list: yes   Last refill:  prednisone-04/22/22 #10 0 refills, doxycyline-100mg  - 04/22/22 #14 0 refills, tessalon- 04/22/22 #30 0 refills  Future visit scheduled: no last seen 3 weeks ago   Notes to clinic:  requesting refills due to sx still present. Last ordered by Raiford Noble, PA . Not delegated per protocol. Medicaiton not assigned to a protocol. No refills remain. Patient requesting a call back once medications refilled please advise      Requested Prescriptions  Pending Prescriptions Disp Refills   predniSONE (DELTASONE) 20 MG tablet 10 tablet 0    Sig: Take 2 tablets (40 mg total) by mouth daily with breakfast.     Not Delegated - Endocrinology:  Oral Corticosteroids Failed - 05/09/2022  9:48 AM      Failed - This refill cannot be delegated      Failed - Manual Review: Eye exam for IOP if prolonged treatment      Failed - Glucose (serum) in normal range and within 180 days    Glucose, Bld  Date Value Ref Range Status  11/27/2021 133 (H) 70 - 99 mg/dL Final    Comment:    Glucose reference range applies only to samples taken after fasting for at least 8 hours.   POC Glucose  Date Value Ref Range Status  03/07/2021 156 (A) 70 - 99 mg/dl Final   Glucose-Capillary  Date Value Ref Range Status  11/19/2021 159 (H) 70 - 99 mg/dL Final    Comment:    Glucose reference range applies only to samples taken after fasting for at least 8 hours.         Failed - Last BP in normal range    BP Readings from Last 1 Encounters:  04/14/22 (!) 150/76         Failed - Bone Mineral Density or Dexa Scan completed in the last 2 years      Passed - K in normal range and within 180 days    Potassium  Date Value Ref Range Status  11/27/2021 3.8 3.5 - 5.1 mmol/L Final         Passed - Na in normal range and within 180 days    Sodium  Date Value Ref Range Status  11/27/2021  140 135 - 145 mmol/L Final  12/11/2020 144 134 - 144 mmol/L Final         Passed - Valid encounter within last 6 months    Recent Outpatient Visits           3 weeks ago Essential hypertension   Primary Care at [facility], [place]   6 months ago Pre-op evaluation   Primary Care at [facility], MD   9 months ago Acute bronchitis, unspecified organism   Primary Care at [facility], MD   1 year ago Uncontrolled hypertension   Primary Care at [facility], [place]   1 year ago Uncontrolled hypertension   Primary Care at [facility], [place]               doxycycline (VIBRA-TABS) 100 MG tablet 14 tablet 0    Sig: Take 1 tablet (100 mg total) by mouth 2 (two) times daily.     Off-Protocol Failed - 05/09/2022  9:48 AM      Failed - Medication not assigned to a protocol,  review manually.      Passed - Valid encounter within last 12 months    Recent Outpatient Visits           3 weeks ago Essential hypertension   Primary Care at [facility], MD   6 months ago Pre-op evaluation   Primary Care at [facility], MD   9 months ago Acute bronchitis, unspecified organism   Primary Care at [facility], MD   1 year ago Uncontrolled hypertension   [facility] at [facility], [place]   1 year ago Uncontrolled hypertension   Primary Care at [facility], Lauris Po[facility], MD               benzonatate (TESSALON) 100 MG capsule 30 capsule 0    Sig: Take 1 capsule (100 mg total) by mouth 3 (three) times daily as needed for cough.     Ear, Nose, and Throat:  Antitussives/Expectorants Passed - 05/09/2022  9:48 AM      Passed - Valid encounter within last 12 months    Recent Outpatient Visits           3 weeks ago Essential hypertension   Primary Care at [facility], MD   6 months ago Pre-op evaluation   Primary Care at  [facility], MD   9 months ago Acute bronchitis, unspecified organism   Primary Care at [facility], MD   1 year ago Uncontrolled hypertension   Primary Care at [facility], MD   1 year ago Uncontrolled hypertension   Primary Care at [facility], MD

## 2022-05-09 NOTE — Telephone Encounter (Signed)
Summary: cough wants antibiotic   Pt was in the office Monday for a chest xray.  She said she is still having a cough, sore throat and congestion.  She would like an antibiotic.   CB@ [phone]        Chief Complaint: requesting antibiotics refilled again for cough, nasal congestion , sore throat Symptoms: see above. Hx COPD. Productive cough - yellow sputum. Blowing nose for clear drainage. Can feel ears pop. Little relief with using inhaler. Sx worse at night . Frequency: last week  Pertinent Negatives: Patient denies chest pain no difficulty breathing no fever Disposition: [] ED /[] Urgent Care (no appt availability in office) / [] Appointment(In office/virtual)/ []  [facility]/ [x] Home Care/ [] Refused Recommended Disposition /[] [facility]/ []  Follow-up with PCP Additional Notes:   Requesting refill for prednisone, doxycyline and tessalon. Patient requesting a call back.       Reason for Disposition  Cough with cold symptoms (e.g., runny nose, postnasal drip, throat clearing)  Answer Assessment - Initial Assessment Questions 1. ONSET: "When did the cough begin?"      On going since last OV 05/01/22 2. SEVERITY: "How bad is the cough today?"      Not going away 3. SPUTUM: "Describe the color of your sputum" (none, dry cough; clear, white, yellow, green)     Yellow  4. HEMOPTYSIS: "Are you coughing up any blood?" If so ask: "How much?" (flecks, streaks, tablespoons, etc.)     na 5. DIFFICULTY BREATHING: "Are you having difficulty breathing?" If Yes, ask: "How bad is it?" (e.g., mild, moderate, severe)    - MILD: No SOB at rest, mild SOB with walking, speaks normally in sentences, can lie down, no retractions, pulse < 100.    - MODERATE: SOB at rest, SOB with minimal exertion and prefers to sit, cannot lie down flat, speaks in phrases, mild retractions, audible wheezing, pulse 100-120.    - SEVERE: Very SOB at rest, speaks in single words, struggling to  breathe, sitting hunched forward, retractions, pulse > 120      Denies  6. FEVER: "Do you have a fever?" If Yes, ask: "What is your temperature, how was it measured, and when did it start?"     na 7. CARDIAC HISTORY: "Do you have any history of heart disease?" (e.g., heart attack, congestive heart failure)      na 8. LUNG HISTORY: "Do you have any history of lung disease?"  (e.g., pulmonary embolus, asthma, emphysema)     Hx COPD  9. PE RISK FACTORS: "Do you have a history of blood clots?" (or: recent major surgery, recent prolonged travel, bedridden)     na 10. OTHER SYMPTOMS: "Do you have any other symptoms?" (e.g., runny nose, wheezing, chest pain)       Runny nose blows nose for clear drainage. Cough, sore throat head and ears hurt when coughing 11. PREGNANCY: "Is there any chance you are pregnant?" "When was your last menstrual period?"       na 12. TRAVEL: "Have you traveled out of the country in the last month?" (e.g., travel history, exposures)       na  Protocols used: Cough - Acute Productive-A-AH

## 2022-05-20 ENCOUNTER — Ambulatory Visit: Payer: Self-pay | Admitting: *Deleted

## 2022-05-20 ENCOUNTER — Telehealth: Payer: Medicare Other | Admitting: Family Medicine

## 2022-05-20 DIAGNOSIS — J449 Chronic obstructive pulmonary disease, unspecified: Secondary | ICD-10-CM

## 2022-05-20 NOTE — Telephone Encounter (Signed)
Summary: Cough, post nasal drip   The patient called in stating she finished taking a steroid but she still has a cough, post nasal drip and she is coughing up yellow mucus. She has also been sneezing, and she has been Electronics engineer. She is not sure what else to do or take at this point. Please assist patient further   [facility] [place], [address] AT [address]  Phone: [phone]  Fax: [phone]      Chief Complaint: prod cough Symptoms: yellow phlegm Frequency: month, has had two rounds of prednisone and antibiotics Pertinent Negatives: Patient denies fever Disposition: [] ED /[] Urgent Care (no appt availability in office) / [] Appointment(In office/virtual)/ [x]  [facility]/ [] Home Care/ [] Refused Recommended Disposition /[] [facility]/ []  Follow-up with PCP Additional Notes: [facility] due to no appts available. Home care discussed.  Reason for Disposition  [1] Continuous (nonstop) coughing interferes with work or school AND [2] no improvement using cough treatment per Care Advice  Answer Assessment - Initial Assessment Questions 1. ONSET: "When did the cough begin?"      A while taken antibiotics twice 2. SEVERITY: "How bad is the cough today?"      Any movement causes it to start 3. SPUTUM: "Describe the color of your sputum" (none, dry cough; clear, white, yellow, green)     Yellow phlegm coughing up 4. HEMOPTYSIS: "Are you coughing up any blood?" If so ask: "How much?" (flecks, streaks, tablespoons, etc.)     no 5. DIFFICULTY BREATHING: "Are you having difficulty breathing?" If Yes, ask: "How bad is it?" (e.g., mild, moderate, severe)    - MILD: No SOB at rest, mild SOB with walking, speaks normally in sentences, can lie down, no retractions, pulse < 100.    - MODERATE: SOB at rest, SOB with minimal exertion and prefers to sit, cannot lie down flat, speaks in phrases, mild retractions, audible  wheezing, pulse 100-120.    - SEVERE: Very SOB at rest, speaks in single words, struggling to breathe, sitting hunched forward, retractions, pulse > 120      A little difficulty breathing, get winded 6. FEVER: "Do you have a fever?" If Yes, ask: "What is your temperature, how was it measured, and when did it start?"     no 7. CARDIAC HISTORY: "Do you have any history of heart disease?" (e.g., heart attack, congestive heart failure)      No 8. LUNG HISTORY: "Do you have any history of lung disease?"  (e.g., pulmonary embolus, asthma, emphysema)     COPD 9. PE RISK FACTORS: "Do you have a history of blood clots?" (or: recent major surgery, recent prolonged travel, bedridden)     no 10. OTHER SYMPTOMS: "Do you have any other symptoms?" (e.g., runny nose, wheezing, chest pain)       Out of breath easily 11. PREGNANCY: "Is there any chance you are pregnant?" "When was your last menstrual period?"       no 12. TRAVEL: "Have you traveled out of the country in the last month?" (e.g., travel history, exposures)       no  Protocols used: Cough - Acute Productive-A-AH

## 2022-05-20 NOTE — Progress Notes (Signed)
   Patient seen in last 30 days by our group. Given Abx, has had steroids as well continues to cough- needs in person eval at this time.  Patient acknowledged agreement and understanding of the plan.

## 2022-05-23 ENCOUNTER — Ambulatory Visit: Payer: Medicare Other

## 2022-05-23 ENCOUNTER — Telehealth: Payer: Self-pay

## 2022-05-23 NOTE — Telephone Encounter (Signed)
Left pt vm to give her home office a call to reschedule AWV appointment. 2nd attempt

## 2022-05-23 NOTE — Telephone Encounter (Signed)
1 attempt no answer, lvm for patient. Will try again in about 5-10 minutes.

## 2022-06-05 ENCOUNTER — Other Ambulatory Visit: Payer: Self-pay | Admitting: Family Medicine

## 2022-06-05 DIAGNOSIS — G629 Polyneuropathy, unspecified: Secondary | ICD-10-CM

## 2022-06-05 NOTE — Telephone Encounter (Signed)
Requested Prescriptions  Pending Prescriptions Disp Refills   gabapentin (NEURONTIN) 300 MG capsule [Pharmacy Med Name: GABAPENTIN 300MG  CAPSULES] 90 capsule 2    Sig: TAKE 1 CAPSULE(300 MG) BY MOUTH THREE TIMES DAILY     Neurology: Anticonvulsants - gabapentin Passed - 06/05/2022  3:44 AM      Passed - Cr in normal range and within 360 days    Creatinine, Ser  Date Value Ref Range Status  11/27/2021 0.74 0.44 - 1.00 mg/dL Final         Passed - Completed PHQ-2 or PHQ-9 in the last 360 days      Passed - Valid encounter within last 12 months    Recent Outpatient Visits           1 month ago Essential hypertension   Primary Care at [facility], MD   7 months ago Pre-op evaluation   Primary Care at [facility], MD   10 months ago Acute bronchitis, unspecified organism   Primary Care at [facility], MD   1 year ago Uncontrolled hypertension   Primary Care at [facility], [place]   1 year ago Uncontrolled hypertension   Primary Care at [facility], MD

## 2022-06-12 ENCOUNTER — Other Ambulatory Visit: Payer: Self-pay | Admitting: Family Medicine

## 2022-06-12 NOTE — Telephone Encounter (Signed)
Requested Prescriptions  Pending Prescriptions Disp Refills   albuterol (VENTOLIN HFA) 108 (90 Base) MCG/ACT inhaler [Pharmacy Med Name: ALBUTEROL HFA INH(200 PUFFS) 18GM] 18 g 1    Sig: INHALE 2 PUFFS INTO THE LUNGS EVERY 6 HOURS AS NEEDED FOR WHEEZING OR SHORTNESS OF BREATH     Pulmonology:  Beta Agonists 2 Failed - 06/12/2022  3:41 AM      Failed - Last BP in normal range    BP Readings from Last 1 Encounters:  04/14/22 (!) 150/76         Passed - Last Heart Rate in normal range    Pulse Readings from Last 1 Encounters:  04/14/22 100         Passed - Valid encounter within last 12 months    Recent Outpatient Visits           1 month ago Essential hypertension   Primary Care at [facility], MD   7 months ago Pre-op evaluation   Primary Care at [facility], MD   10 months ago Acute bronchitis, unspecified organism   Primary Care at [facility], MD   1 year ago Uncontrolled hypertension   Primary Care at [facility], MD   1 year ago Uncontrolled hypertension   Primary Care at [facility], MD

## 2022-06-22 ENCOUNTER — Other Ambulatory Visit: Payer: Self-pay | Admitting: Family Medicine

## 2022-06-22 DIAGNOSIS — N3281 Overactive bladder: Secondary | ICD-10-CM

## 2022-07-25 ENCOUNTER — Other Ambulatory Visit: Payer: Self-pay | Admitting: Family Medicine

## 2022-08-13 ENCOUNTER — Other Ambulatory Visit: Payer: Self-pay | Admitting: Family Medicine

## 2022-08-13 DIAGNOSIS — I10 Essential (primary) hypertension: Secondary | ICD-10-CM

## 2022-08-21 ENCOUNTER — Other Ambulatory Visit: Payer: Self-pay | Admitting: *Deleted

## 2022-08-21 MED ORDER — ALBUTEROL SULFATE HFA 108 (90 BASE) MCG/ACT IN AERS: 2.0000 | INHALATION_SPRAY | Freq: Four times a day (QID) | RESPIRATORY_TRACT | 1 refills | Status: DC | PRN

## 2022-09-09 ENCOUNTER — Other Ambulatory Visit: Payer: Self-pay | Admitting: Family Medicine

## 2022-09-09 DIAGNOSIS — G629 Polyneuropathy, unspecified: Secondary | ICD-10-CM

## 2022-09-10 NOTE — Telephone Encounter (Signed)
Requested Prescriptions  Pending Prescriptions Disp Refills   gabapentin (NEURONTIN) 300 MG capsule [Pharmacy Med Name: GABAPENTIN 300MG  CAPSULES] 270 capsule 0    Sig: TAKE 1 CAPSULE(300 MG) BY MOUTH THREE TIMES DAILY     Neurology: Anticonvulsants - gabapentin Passed - 09/09/2022  3:41 AM      Passed - Cr in normal range and within 360 days    Creatinine, Ser  Date Value Ref Range Status  11/27/2021 0.74 0.44 - 1.00 mg/dL Final         Passed - Completed PHQ-2 or PHQ-9 in the last 360 days      Passed - Valid encounter within last 12 months    Recent Outpatient Visits           4 months ago Essential hypertension   [place] [facility] at [facility], MD   10 months ago Pre-op evaluation   [place] [facility] at [facility], MD   1 year ago Acute bronchitis, unspecified organism   [place] [facility] at [facility], MD   1 year ago Uncontrolled hypertension   [place] [facility] at [facility], MD   1 year ago Uncontrolled hypertension   [place] [facility] at [facility], MD

## 2022-09-23 ENCOUNTER — Other Ambulatory Visit: Payer: Self-pay | Admitting: Family Medicine

## 2022-09-23 DIAGNOSIS — N3281 Overactive bladder: Secondary | ICD-10-CM

## 2022-11-03 ENCOUNTER — Other Ambulatory Visit (HOSPITAL_COMMUNITY): Payer: Self-pay | Admitting: Psychiatry

## 2022-11-03 DIAGNOSIS — F32A Depression, unspecified: Secondary | ICD-10-CM

## 2022-11-17 ENCOUNTER — Encounter: Payer: Self-pay | Admitting: *Deleted

## 2022-11-20 ENCOUNTER — Other Ambulatory Visit: Payer: Self-pay | Admitting: Family Medicine

## 2022-12-12 ENCOUNTER — Other Ambulatory Visit: Payer: Self-pay | Admitting: Family Medicine

## 2022-12-12 DIAGNOSIS — G629 Polyneuropathy, unspecified: Secondary | ICD-10-CM

## 2022-12-12 NOTE — Telephone Encounter (Signed)
Requested medication (s) are due for refill today: yes  Requested medication (s) are on the active medication list: yes  Last refill:  09/10/22   #270  Future visit scheduled: no  Notes to clinic:  overdue lab work   Requested Prescriptions  Pending Prescriptions Disp Refills   gabapentin (NEURONTIN) 300 MG capsule [Pharmacy Med Name: GABAPENTIN 300MG  CAPSULES] 270 capsule 0    Sig: TAKE 1 CAPSULE(300 MG) BY MOUTH THREE TIMES DAILY     Neurology: Anticonvulsants - gabapentin Failed - 12/12/2022  8:37 AM      Failed - Cr in normal range and within 360 days    Creatinine, Ser  Date Value Ref Range Status  11/27/2021 0.74 0.44 - 1.00 mg/dL Final         Passed - Completed PHQ-2 or PHQ-9 in the last 360 days      Passed - Valid encounter within last 12 months    Recent Outpatient Visits           8 months ago Essential hypertension   [place] [facility] at [facility], MD   1 year ago Pre-op evaluation   [place] [facility] at [facility], MD   1 year ago Acute bronchitis, unspecified organism   [place] [facility] at [facility], MD   1 year ago Uncontrolled hypertension   [place] [facility] at [facility], MD   1 year ago Uncontrolled hypertension   [place] [facility] at [facility], MD

## 2023-01-08 ENCOUNTER — Other Ambulatory Visit: Payer: Self-pay | Admitting: Family Medicine

## 2023-03-05 ENCOUNTER — Ambulatory Visit (INDEPENDENT_AMBULATORY_CARE_PROVIDER_SITE_OTHER): Payer: Medicare HMO | Admitting: Allergy and Immunology

## 2023-03-05 ENCOUNTER — Encounter: Payer: Self-pay | Admitting: Allergy and Immunology

## 2023-03-05 VITALS — BP 166/88 | HR 97 | Temp 98.0°F | Resp 24 | Ht 63.0 in | Wt 212.0 lb

## 2023-03-05 DIAGNOSIS — J4550 Severe persistent asthma, uncomplicated: Secondary | ICD-10-CM

## 2023-03-05 DIAGNOSIS — F17210 Nicotine dependence, cigarettes, uncomplicated: Secondary | ICD-10-CM

## 2023-03-05 DIAGNOSIS — J4489 Other specified chronic obstructive pulmonary disease: Secondary | ICD-10-CM

## 2023-03-05 DIAGNOSIS — J3089 Other allergic rhinitis: Secondary | ICD-10-CM | POA: Diagnosis not present

## 2023-03-05 DIAGNOSIS — D72824 Basophilia: Secondary | ICD-10-CM

## 2023-03-05 DIAGNOSIS — D649 Anemia, unspecified: Secondary | ICD-10-CM

## 2023-03-05 DIAGNOSIS — K219 Gastro-esophageal reflux disease without esophagitis: Secondary | ICD-10-CM

## 2023-03-05 DIAGNOSIS — D7219 Other eosinophilia: Secondary | ICD-10-CM

## 2023-03-05 DIAGNOSIS — D508 Other iron deficiency anemias: Secondary | ICD-10-CM

## 2023-03-05 MED ORDER — SPIRIVA RESPIMAT 2.5 MCG/ACT IN AERS: 2.0000 | INHALATION_SPRAY | Freq: Every day | RESPIRATORY_TRACT | 5 refills | Status: DC

## 2023-03-05 MED ORDER — BUDESONIDE-FORMOTEROL FUMARATE 160-4.5 MCG/ACT IN AERO: 2.0000 | INHALATION_SPRAY | Freq: Two times a day (BID) | RESPIRATORY_TRACT | 5 refills | Status: DC

## 2023-03-05 MED ORDER — MOMETASONE FUROATE 50 MCG/ACT NA SUSP: 1.0000 | Freq: Two times a day (BID) | NASAL | 5 refills | Status: DC

## 2023-03-05 MED ORDER — FAMOTIDINE 40 MG PO TABS: 40.0000 mg | ORAL_TABLET | Freq: Every day | ORAL | 5 refills | Status: DC

## 2023-03-05 NOTE — Progress Notes (Signed)
[place] - [place] - [place]   Dear Raliegh Ip,  Thank you for referring [name] to the [facility] [place] on 03/05/2023.   Below is a summation of this patient's evaluation and recommendations.  Thank you for your referral. I will keep you informed about this patient's response to treatment.   If you have any questions please do not hesitate to contact me.   Sincerely,  Jessica Priest, MD Allergy / Immunology [facility]   ______________________________________________________________________    NEW PATIENT NOTE  Referring Provider: Dorene Grebe* Primary Provider: Howell Pringle, PA-C Date of office visit: 03/05/2023    Subjective:   Chief Complaint:  [name] (DOB: [birth year]) is a 62 y.o. female who presents to the clinic on 03/05/2023 with a chief complaint of Cough .     HPI: [name] presents to this clinic in evaluation of coughing.  She has a long history of problems with her lungs given her hobby of smoking tobacco at a rate of 1 pack/day for the past 40 years.  She has always had problems with some shortness of breath and dyspnea on exertion and some coughing and some wheezing.  But her pattern has changed significantly in the past year or so.  She now has coughing spells that are uncontrollable and leave her breathless and almost make her pass out and make her urinate on herself and make her vomit.  She still is very short of breath if she goes outdoors and exerts herself especially in the heat.  She has been treated with multiple inhalers and is currently using Trelegy yet still continues to have these problems.  She has a short acting bronchodilator both by MDI formulation and nebulizer formulation which may help her transiently.  She has some issues with sneezing and nasal congestion but that is not a particularly big  issue for her.  She does not have any anosmia.  She has seen ENT who apparently performed a CT of her sinuses which was normal.  She does have lots of throat issues with a dry spot in her throat and in fact it is a dry spot in her throat that precipitates her cough.  She has lots of throat clearing and raspy voice.  She has very bad reflux that is still active even while using antireflux medicines twice a day.  She still has regurgitation and burning.  She drinks 3 Cokes per day and has 1 tea per day and rarely has any chocolate and does not drink alcohol.  She is being evaluated for sleep apnea and will be having a sleep study.  Past Medical History:  Diagnosis Date   Anxiety    Arthritis    COPD (chronic obstructive pulmonary disease) (HCC)    uses oxygen 4L at nite   Depression    Dyspnea    GERD (gastroesophageal reflux disease)    Hypertension    Peripheral edema    2+ at PAT visit    Past Surgical History:  Procedure Laterality Date   ABDOMINAL HYSTERECTOMY  1986   CARPAL TUNNEL RELEASE Bilateral 1980   Per patient   SHOULDER SURGERY Left 1980   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   Procedure: RIGHT TOTAL HIP ARTHROPLASTY ANTERIOR APPROACH;  Surgeon: Gean Birchwood, MD;  Location: [facility];  Service: Orthopedics;  Laterality: Right;   TOTAL HIP ARTHROPLASTY Left 11/11/2021  Procedure: LEFT TOTAL HIP ARTHROPLASTY ANTERIOR APPROACH;  Surgeon: Gean Birchwood, MD;  Location: [facility];  Service: Orthopedics;  Laterality: Left;    Allergies as of 03/05/2023       Reactions   Tizanidine Other (See Comments)   May 2022- Reported hallucinations vs dreams w/ sleep walking at home when tizanidine added post-op   Amoxicillin    Yeast infection ; tolerated Cefepime        Medication List    albuterol 108 (90 Base) MCG/ACT inhaler Commonly known as: VENTOLIN HFA INHALE 2 PUFFS INTO THE LUNGS EVERY 6 HOURS AS NEEDED FOR WHEEZING OR SHORTNESS OF BREATH   albuterol (2.5 MG/3ML) 0.083%  nebulizer solution Commonly known as: PROVENTIL Take by nebulization.   amLODipine 5 MG tablet Commonly known as: NORVASC Take 5 mg by mouth daily.   aspirin EC 81 MG tablet Take 1 tablet (81 mg total) by mouth 2 (two) times daily.   benzonatate 100 MG capsule Commonly known as: TESSALON Take 1 capsule (100 mg total) by mouth 3 (three) times daily as needed for cough.   busPIRone 10 MG tablet Commonly known as: BUSPAR Take 1 tablet (10 mg total) by mouth 3 (three) times daily.   folic acid 1 MG tablet Commonly known as: FOLVITE TAKE 1 TABLET(1 MG) BY MOUTH DAILY   gabapentin 300 MG capsule Commonly known as: NEURONTIN TAKE 1 CAPSULE(300 MG) BY MOUTH THREE TIMES DAILY   hydrOXYzine 50 MG tablet Commonly known as: ATARAX TAKE 1 TABLET (50 MG TOTAL) BY MOUTH 3 (THREE) TIMES DAILY AS NEEDED FOR ANXIETY.   ibuprofen 200 MG tablet Commonly known as: ADVIL Take 600 mg by mouth daily as needed for moderate pain.   losartan 100 MG tablet Commonly known as: COZAAR TAKE 1 TABLET BY MOUTH EVERY DAY   montelukast 10 MG tablet Commonly known as: SINGULAIR Take 1 tablet by mouth at bedtime.   multivitamin with minerals Tabs tablet Take 1 tablet by mouth daily.   oxybutynin 10 MG 24 hr tablet Commonly known as: DITROPAN-XL Take 10 mg by mouth daily.   OXYGEN Inhale 4 L into the lungs at bedtime.   pantoprazole 40 MG tablet Commonly known as: PROTONIX TAKE 1 TABLET(40 MG) BY MOUTH TWICE DAILY   QUEtiapine 300 MG tablet Commonly known as: SEROQUEL TAKE 1 TABLET(300 MG) BY MOUTH AT BEDTIME   sertraline 100 MG tablet Commonly known as: ZOLOFT Take 2 tablets (200 mg total) by mouth daily.   Trelegy Ellipta 200-62.5-25 MCG/ACT Aepb Generic drug: Fluticasone-Umeclidin-Vilant Inhale 1 puff into the lungs daily.   Unisom SleepMelts 25 MG Tbdp Generic drug: diphenhydrAMINE HCl (Sleep) Take 1 tablet (25 mg total) by mouth at bedtime as needed.    Review of systems  negative except as noted in HPI / PMHx or noted below:  Review of Systems  Constitutional: Negative.   HENT: Negative.    Eyes: Negative.   Respiratory: Negative.    Cardiovascular: Negative.   Gastrointestinal: Negative.   Genitourinary: Negative.   Musculoskeletal: Negative.   Skin: Negative.   Neurological: Negative.   Endo/Heme/Allergies: Negative.   Psychiatric/Behavioral: Negative.      Family History  Problem Relation Age of Onset   Allergic rhinitis Mother    Allergic rhinitis Father    Ulcers Father    Allergic rhinitis Sister    Hypotension Sister    Allergic rhinitis Brother    Asthma Brother    Hypertension Brother    Eczema Neg Hx  Immunodeficiency Neg Hx    Urticaria Neg Hx    Angioedema Neg Hx    Atopy Neg Hx     Social History   Socioeconomic History   Marital status: Divorced    Spouse name: Not on file   Number of children: 0   Years of education: 12   Highest education level: GED or equivalent  Occupational History   Occupation: Armed forces operational officer  Tobacco Use   Smoking status: Every Day    Current packs/day: 1.00    Average packs/day: 1 pack/day for 44.6 years (44.6 ttl pk-yrs)    Types: Cigarettes    Start date: 08/04/1978   Smokeless tobacco: Never  Vaping Use   Vaping status: Never Used  Substance and Sexual Activity   Alcohol use: Never   Drug use: Never   Sexual activity: Never  Other Topics Concern   Not on file  Social History Narrative   Lives and step-dad, they are able to help.   Social Determinants of Health   Financial Resource Strain: Not on file  Food Insecurity: Low Risk  (01/07/2023)   [facility]   Food vital sign    Within the past 12 months, you worried that your food would run out before you got money to buy more: Never true    Within the past 12 months, the food you bought just didn't last and you didn't have money to get more: Not on file  Transportation Needs: Not on file   Physical Activity: Not on file  Stress: Not on file  Social Connections: Not on file  Intimate Partner Violence: Not on file    Environmental and Social history  Lives in a apartment with a dry environment, no animals located inside the household, no carpet in the bedroom, no plastic on the bed, no plastic on the pillow, actively smoking tobacco products.  Objective:   Vitals:   03/05/23 0854  BP: (!) 166/88  Pulse: 97  Resp: (!) 24  Temp: 98 F (36.7 C)  SpO2: 95%   Height: 5\' 3"  (160 cm) Weight: 212 lb (96.2 kg)  Physical Exam Constitutional:      Appearance: She is not diaphoretic.     Comments: Coughing, throat clearing  HENT:     Head: Normocephalic.     Right Ear: Tympanic membrane, ear canal and external ear normal.     Left Ear: Tympanic membrane, ear canal and external ear normal.     Nose: Nose normal. No mucosal edema or rhinorrhea.     Mouth/Throat:     Pharynx: Uvula midline. No oropharyngeal exudate.  Eyes:     Conjunctiva/sclera: Conjunctivae normal.  Neck:     Thyroid: No thyromegaly.     Trachea: Trachea normal. No tracheal tenderness or tracheal deviation.  Cardiovascular:     Rate and Rhythm: Normal rate and regular rhythm.     Heart sounds: Normal heart sounds, S1 normal and S2 normal. No murmur heard. Pulmonary:     Effort: No respiratory distress.     Breath sounds: No stridor. Wheezing (wheezing expiratory) present. No rales.  Lymphadenopathy:     Head:     Right side of head: No tonsillar adenopathy.     Left side of head: No tonsillar adenopathy.     Cervical: No cervical adenopathy.  Skin:    Findings: No erythema or rash.     Nails: There is no clubbing.  Neurological:     Mental Status:  She is alert.     Diagnostics: Allergy skin tests were performed.  She did not demonstrate any hypersensitivity against a screening panel of aeroallergens. Spirometry was performed and demonstrated an FEV1 of 0.81 @ 34 % of predicted. FEV1/FVC =  0.53  Results of blood tests obtained 27 November 2021 identifies WBC 14.2, absolute eosinophil 600, absolute basophil 200, absolute lymphocyte 1800, hemoglobin 9.6, platelet 810   Assessment and Plan:    1. Not well controlled severe persistent asthma   2. COPD with asthma   3. Perennial allergic rhinitis   4. LPRD (laryngopharyngeal reflux disease)   5. Other eosinophilia   6. Basophilia   7. Anemia, unspecified type   8. Heavy tobacco smoker >10 cigarettes per day    1. Allergen avoidance measures???  2. Treat and prevent inflammation of airway:   A. Symbicort 160 - 2 inhalations 2 times per day w/ spacer ( empty lungs)  B. Spiriva 2.5 Respimat - 2 inhalations 1 time per day (empty lungs)  C. Nasonex - 1 spray each nostril 2 times per day  D. Use nicotine substitutes to replace smoke exposure  3. Treat and prevent reflux induced inflammation of airway:   A. Slowly decrease caffeine consumption  B. Pantoprazole 40 mg - 1 tablet 2 times per day  C. Famotidine 40 mg - 1 tablet 1 time per day  D. Replace throat clearing with swallowing / drinking maneuver  4. If needed:   A. Albuterol - 2 inhalations every 4-6 hours  B. Albuterol nebulization - every 4-6 hours  5. Blood - CBC w/d, anemia panel w/ ferritin, alpha-1 antitrypsin level and phenotype  6. Return to clinic in 4 weeks or earlier if needed  7. Biologic agent???  [name] has an inflamed and irritated respiratory tract and we will treat her with a combination of anti-inflammatory agents as noted above, hopefully get her to eliminate smoke exposure, and treat LPR aggressively with the plan noted above.  And, she has eosinophilia, basophilia, anemia and we will work through that issue with the blood test noted above and also check an alpha-1 antitrypsin level and phenotype given her very significant airflow obstruction.  She may be a candidate for a biologic agent.  I will see her back in this clinic in 4 weeks or earlier if  there is a problem.  Jessica Priest, MD Allergy / Immunology [facility] [place]

## 2023-03-05 NOTE — Patient Instructions (Addendum)
  1. Allergen avoidance measures  2. Treat and prevent inflammation of airway:   A. Symbicort 160 - 2 inhalations 2 times per day w/ spacer ( empty lungs)  B. Spiriva 2.5 Respimat - 2 inhalations 1 time per day (empty lungs)  C. Nasonex - 1 spray each nostril 2 times per day  D. Use nicotine substitutes to replace smoke exposure  3. Treat and prevent reflux induced inflammation of airway:   A. Slowly decrease caffeine consumption  B. Pantoprazole 40 mg - 1 tablet 2 times per day  C. Famotidine 40 mg - 1 tablet 1 time per day  D. Replace throat clearing with swallowing / drinking maneuver  4. If needed:   A. Albuterol - 2 inhalations every 4-6 hours  B. Albuterol nebulization - every 4-6 hours  5. Blood - CBC w/d, anemia panel w/ ferritin, alpha-1 antitrypsin level and phenotype  6. Return to clinic in 4 weeks or earlier if needed  7. Biologic agent???

## 2023-03-09 ENCOUNTER — Encounter: Payer: Self-pay | Admitting: Allergy and Immunology

## 2023-03-30 ENCOUNTER — Ambulatory Visit: Payer: Medicare HMO | Admitting: Allergy and Immunology

## 2023-04-13 ENCOUNTER — Ambulatory Visit: Payer: Medicare HMO | Admitting: Allergy and Immunology

## 2023-04-21 ENCOUNTER — Other Ambulatory Visit (HOSPITAL_COMMUNITY): Payer: Self-pay

## 2023-04-24 ENCOUNTER — Telehealth: Payer: Self-pay

## 2023-04-24 ENCOUNTER — Other Ambulatory Visit (HOSPITAL_COMMUNITY): Payer: Self-pay

## 2023-04-24 NOTE — Telephone Encounter (Signed)
Pharmacy Patient Advocate Encounter  Received notification from [facility] that Prior Authorization for Spiriva Respimat 2.5MCG/ACT aerosol has been APPROVED from 04-24-2023 to 08-03-2023. Ran test claim, Copay is $4.60. This test claim was processed through [facility]- copay amounts may vary at other pharmacies due to pharmacy/plan contracts, or as the patient moves through the different stages of their insurance plan.   PA #/Case ID/Reference #: [id number]

## 2023-04-24 NOTE — Telephone Encounter (Signed)
Pharmacy Patient Advocate Encounter   Received notification from CoverMyMeds that prior authorization for Spiriva Respimat 2.5MCG/ACT aerosol is required/requested.   Insurance verification completed.   The patient is insured through [employer] .   Per test claim: PA required; PA submitted to [facility] via CoverMyMeds Key/confirmation #/EOC [id number] Status is pending

## 2023-07-10 ENCOUNTER — Other Ambulatory Visit: Payer: Self-pay | Admitting: *Deleted

## 2023-07-10 MED ORDER — FAMOTIDINE 40 MG PO TABS: 40.0000 mg | ORAL_TABLET | Freq: Every day | ORAL | 1 refills | Status: DC

## 2023-07-10 MED ORDER — BUDESONIDE-FORMOTEROL FUMARATE 160-4.5 MCG/ACT IN AERO: 2.0000 | INHALATION_SPRAY | Freq: Two times a day (BID) | RESPIRATORY_TRACT | 1 refills | Status: DC

## 2023-07-10 MED ORDER — SPIRIVA RESPIMAT 2.5 MCG/ACT IN AERS: 2.0000 | INHALATION_SPRAY | Freq: Every day | RESPIRATORY_TRACT | 1 refills | Status: DC

## 2023-07-10 MED ORDER — MOMETASONE FUROATE 50 MCG/ACT NA SUSP: 1.0000 | Freq: Two times a day (BID) | NASAL | 1 refills | Status: DC

## 2023-09-16 ENCOUNTER — Ambulatory Visit: Payer: Medicare HMO | Admitting: Allergy and Immunology

## 2023-10-05 ENCOUNTER — Encounter: Payer: Self-pay | Admitting: Allergy and Immunology

## 2023-10-05 ENCOUNTER — Ambulatory Visit (INDEPENDENT_AMBULATORY_CARE_PROVIDER_SITE_OTHER): Payer: Medicare HMO | Admitting: Allergy and Immunology

## 2023-10-05 VITALS — BP 136/82 | HR 88 | Resp 16

## 2023-10-05 DIAGNOSIS — J4489 Other specified chronic obstructive pulmonary disease: Secondary | ICD-10-CM

## 2023-10-05 DIAGNOSIS — J3089 Other allergic rhinitis: Secondary | ICD-10-CM

## 2023-10-05 DIAGNOSIS — J4550 Severe persistent asthma, uncomplicated: Secondary | ICD-10-CM | POA: Diagnosis not present

## 2023-10-05 DIAGNOSIS — K219 Gastro-esophageal reflux disease without esophagitis: Secondary | ICD-10-CM

## 2023-10-05 DIAGNOSIS — D7219 Other eosinophilia: Secondary | ICD-10-CM

## 2023-10-05 DIAGNOSIS — F17210 Nicotine dependence, cigarettes, uncomplicated: Secondary | ICD-10-CM

## 2023-10-05 NOTE — Progress Notes (Unsigned)
 [place] - [place] - [place]   Follow-up Note  Referring Provider: Dorene Grebe* Primary Provider: Alroy Bailiff Date of Office Visit: 10/05/2023  Subjective:   [name] (DOB: 05-Mar-1961) is a 63 y.o. female who [facility] the [facility] on 10/05/2023 in re-evaluation of the following:  HPI: [name] returns to this clinic in evaluation of COPD with asthma, allergic rhinitis, LPR, eosinophilia, basophilia, anemia, tobacco smoking.  I last saw her in this clinic 05 March 2023 for her initial evaluation.  She is under the care of multiple doctors including a local pulmonologist and there is a lot of confusion about what medication she is using and how she is using them and when she is using them.  It sounds as though she might be using trilogy and she might be using Symbicort and sometimes she will use 1 and sometimes she will use the other and she certainly uses a short acting bronchodilator on a pretty consistent basis for issues with wheezing and coughing and some shortness of breath.  She continues to smoke.  Her nose has been okay although she still has some nasal congestion on occasion.  She has very dry mouth and she has lots of throat clearing and still feels as though her throat is irritated.  We started her on antireflux medicines during her last visit and we encouraged her to consolidate her rather significant caffeine consumption but she still drinks 6 caffeinated drinks per day.  Her throat is so dry that she has problems swallowing food and less she has lots of fluid while she is eating.  She never has any choking while swallowing fluid.  Allergies as of 10/05/2023       Reactions   Tizanidine Other (See Comments)   May 2022- Reported hallucinations vs dreams w/ sleep walking at home when tizanidine added post-op   Amoxicillin    Yeast infection ; tolerated Cefepime        Medication List         Accurate as of October 05, 2023 10:55 AM. If you have any questions, ask your nurse or doctor.          STOP taking these medications    Spiriva Respimat 2.5 MCG/ACT Aers Generic drug: Tiotropium Bromide Monohydrate Stopped by: Valbona Slabach J Danyia Borunda       TAKE these medications    albuterol 108 (90 Base) MCG/ACT inhaler Commonly known as: VENTOLIN HFA INHALE 2 PUFFS INTO THE LUNGS EVERY 6 HOURS AS NEEDED FOR WHEEZING OR SHORTNESS OF BREATH   albuterol (2.5 MG/3ML) 0.083% nebulizer solution Commonly known as: PROVENTIL Take by nebulization.   amLODipine 5 MG tablet Commonly known as: NORVASC Take 5 mg by mouth daily.   aspirin EC 81 MG tablet Take 1 tablet (81 mg total) by mouth 2 (two) times daily.   benzonatate 100 MG capsule Commonly known as: TESSALON Take 1 capsule (100 mg total) by mouth 3 (three) times daily as needed for cough.   budesonide-formoterol 160-4.5 MCG/ACT inhaler Commonly known as: Symbicort Inhale 2 puffs into the lungs 2 (two) times daily.   busPIRone 10 MG tablet Commonly known as: BUSPAR Take 1 tablet (10 mg total) by mouth 3 (three) times daily.   famotidine 40 MG tablet Commonly known as: PEPCID Take 1 tablet (40 mg total) by mouth daily.   folic acid 1 MG tablet Commonly known as: FOLVITE TAKE 1 TABLET(1 MG) BY MOUTH DAILY  gabapentin 300 MG capsule Commonly known as: NEURONTIN TAKE 1 CAPSULE(300 MG) BY MOUTH THREE TIMES DAILY   hydrOXYzine 50 MG tablet Commonly known as: ATARAX TAKE 1 TABLET (50 MG TOTAL) BY MOUTH 3 (THREE) TIMES DAILY AS NEEDED FOR ANXIETY.   ibuprofen 200 MG tablet Commonly known as: ADVIL Take 600 mg by mouth daily as needed for moderate pain.   losartan 100 MG tablet Commonly known as: COZAAR TAKE 1 TABLET BY MOUTH EVERY DAY   mometasone 50 MCG/ACT nasal spray Commonly known as: Nasonex Place 1 spray into the nose in the morning and at bedtime.   montelukast 10 MG tablet Commonly known as: SINGULAIR Take 1  tablet by mouth at bedtime.   multivitamin with minerals Tabs tablet Take 1 tablet by mouth daily.   oxybutynin 10 MG 24 hr tablet Commonly known as: DITROPAN-XL Take 10 mg by mouth daily.   OXYGEN Inhale 4 L into the lungs at bedtime.   pantoprazole 40 MG tablet Commonly known as: PROTONIX TAKE 1 TABLET(40 MG) BY MOUTH TWICE DAILY What changed: See the new instructions.   QUEtiapine 300 MG tablet Commonly known as: SEROQUEL TAKE 1 TABLET(300 MG) BY MOUTH AT BEDTIME   sertraline 100 MG tablet Commonly known as: ZOLOFT Take 2 tablets (200 mg total) by mouth daily.   Trelegy Ellipta 200-62.5-25 MCG/ACT Aepb Generic drug: Fluticasone-Umeclidin-Vilant Inhale 1 puff into the lungs daily.   Unisom SleepMelts 25 MG Tbdp Generic drug: diphenhydrAMINE HCl (Sleep) Take 1 tablet (25 mg total) by mouth at bedtime as needed.    Past Medical History:  Diagnosis Date   Anxiety    Arthritis    COPD (chronic obstructive pulmonary disease) (HCC)    uses oxygen 4L at nite   Depression    Dyspnea    GERD (gastroesophageal reflux disease)    Hypertension    Peripheral edema    2+ at PAT visit    Past Surgical History:  Procedure Laterality Date   ABDOMINAL HYSTERECTOMY  1986   CARPAL TUNNEL RELEASE Bilateral 1980   Per patient   SHOULDER SURGERY Left 1980   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   Procedure: RIGHT TOTAL HIP ARTHROPLASTY ANTERIOR APPROACH;  Surgeon: Gean Birchwood, MD;  Location: [facility];  Service: Orthopedics;  Laterality: Right;   TOTAL HIP ARTHROPLASTY Left 11/11/2021   Procedure: LEFT TOTAL HIP ARTHROPLASTY ANTERIOR APPROACH;  Surgeon: Gean Birchwood, MD;  [facility];  Service: Orthopedics;  Laterality: Left;    Review of systems negative except as noted in HPI / PMHx or noted below:  Review of Systems  Constitutional: Negative.   HENT: Negative.    Eyes: Negative.   Respiratory: Negative.    Cardiovascular: Negative.   Gastrointestinal: Negative.    Genitourinary: Negative.   Musculoskeletal: Negative.   Skin: Negative.   Neurological: Negative.   Endo/Heme/Allergies: Negative.   Psychiatric/Behavioral: Negative.       Objective:   Vitals:   10/05/23 1041  BP: 136/82  Pulse: 88  Resp: 16  SpO2: 94%          Physical Exam Constitutional:      Appearance: She is not diaphoretic.  HENT:     Head: Normocephalic.     Right Ear: Tympanic membrane, ear canal and external ear normal.     Left Ear: Tympanic membrane, ear canal and external ear normal.     Nose: Nose normal. No mucosal edema or rhinorrhea.     Mouth/Throat:  Pharynx: Uvula midline. No oropharyngeal exudate.  Eyes:     Conjunctiva/sclera: Conjunctivae normal.  Neck:     Thyroid: No thyromegaly.     Trachea: Trachea normal. No tracheal tenderness or tracheal deviation.  Cardiovascular:     Rate and Rhythm: Normal rate and regular rhythm.     Heart sounds: Normal heart sounds, S1 normal and S2 normal. No murmur heard. Pulmonary:     Effort: No respiratory distress.     Breath sounds: No stridor. Wheezing present. No rales.  Lymphadenopathy:     Head:     Right side of head: No tonsillar adenopathy.     Left side of head: No tonsillar adenopathy.     Cervical: No cervical adenopathy.  Skin:    Findings: No erythema or rash.     Nails: There is no clubbing.  Neurological:     Mental Status: She is alert.     Diagnostics:    Spirometry was performed and demonstrated an FEV1 of 0.97 at 42 % of predicted.  Results of blood tests obtained 18 May 2023 identifies WBC 8.9, absolute eosinophil 600, absolute basophil 100, hemoglobin 13.4, platelet 323, 0.64 Mg/DL, AST 82N/F, ALT 62Z/H,  Assessment and Plan:   1. Not well controlled severe persistent asthma   2. COPD with asthma (HCC)   3. Perennial allergic rhinitis   4. LPRD (laryngopharyngeal reflux disease)   5. Other eosinophilia   6. Heavy tobacco smoker >10 cigarettes per day     1.  Treat and prevent inflammation of airway:   A. Trelegy 200 - 1 inhalation 1 time per day  B. Start mepolizumab injections every month  C. Nasonex - 1 spray each nostril 2 times per day  D. Use nicotine substitutes to replace smoke exposure  2. Treat and prevent reflux induced inflammation of airway:   A. Decrease caffeine consumption  B. Pantoprazole 40 mg - 1 tablet 2 times per day  C. Famotidine 40 mg - 1 tablet 1 time per day  D. Replace throat clearing with swallowing / drinking maneuver  3. If needed:   A. Albuterol - 2 inhalations every 4-6 hours  B. Albuterol nebulization - every 4-6 hours  4. Oxybutynin is probably making your mouth dry.  Do you need this?  5. Return to clinic in 4 weeks or earlier if needed  6. Influenza = tamiflu. Covid = Paxlovid  Daphnee appears to have significant inflammation of her airway and there is a collection of etiologic agents responsible for this including her eosinophilic disease, her tobacco smoking, and her reflux which we will attempt to address with the therapy noted above.  I think it would be best to start her on Mepolizumab for the next 6 months as a trial to see if this biologic agent helps her respiratory tract status.  Of course she should stop smoking.  She consumes a large amount of caffeine and she needs to consolidate that issue as I suspect she is having some LPR.  I will look through her previous ENT note to see if there was a thorough examination of her Larynex.  She has very dry mouth and this may be secondary to the anticholinergic agent and Trelegy but it could also be secondary to her oxybutynin and I have asked her to make a decision about whether or not this is a medication that she requires.  I will see her back in this clinic in 4 weeks.  Laurette Schimke, MD Allergy / Immunology  [facility]

## 2023-10-05 NOTE — Patient Instructions (Addendum)
  1. Treat and prevent inflammation of airway:   A. Trelegy 200 - 1 inhalation 1 time per day  B. Start mepolizumab injections every month  C. Nasonex - 1 spray each nostril 2 times per day  D. Use nicotine substitutes to replace smoke exposure  2. Treat and prevent reflux induced inflammation of airway:   A. Decrease caffeine consumption  B. Pantoprazole 40 mg - 1 tablet 2 times per day  C. Famotidine 40 mg - 1 tablet 1 time per day  D. Replace throat clearing with swallowing / drinking maneuver  3. If needed:   A. Albuterol - 2 inhalations every 4-6 hours  B. Albuterol nebulization - every 4-6 hours  4. Oxybutynin is probably making your mouth dry.  Do you need this?  5. Return to clinic in 4 weeks or earlier if needed  6. Influenza = tamiflu. Covid = Paxlovid

## 2023-10-06 ENCOUNTER — Encounter: Payer: Self-pay | Admitting: Allergy and Immunology

## 2023-10-07 ENCOUNTER — Telehealth: Payer: Self-pay | Admitting: *Deleted

## 2023-10-07 MED ORDER — NUCALA 100 MG/ML ~~LOC~~ SOSY
100.0000 mg | PREFILLED_SYRINGE | SUBCUTANEOUS | 11 refills | Status: AC
  Filled 2023-10-08: qty 1, 28d supply, fill #0
  Filled 2023-11-19 (×2): qty 1, 28d supply, fill #1
  Filled 2023-12-21: qty 1, 28d supply, fill #2
  Filled 2024-01-11: qty 1, 28d supply, fill #3
  Filled 2024-02-17: qty 1, 28d supply, fill #4
  Filled 2024-03-09: qty 1, 28d supply, fill #5
  Filled 2024-03-31: qty 1, 28d supply, fill #6
  Filled 2024-05-23: qty 1, 28d supply, fill #7

## 2023-10-07 NOTE — Telephone Encounter (Signed)
 Called patient and advised approval and submit to [facility] for [place]. She wants to get same in clinic so I will reach out once delivery set to make appt to start therapy

## 2023-10-07 NOTE — Telephone Encounter (Signed)
-----   Message from [facility] Shanda Bumps G sent at 10/05/2023  2:20 PM EST ----- Regarding: Nucala Dr. Lucie Leather would like for patient to start Nucala injection for asthma. Consent was signed.

## 2023-10-08 ENCOUNTER — Other Ambulatory Visit (HOSPITAL_COMMUNITY): Payer: Self-pay

## 2023-10-08 ENCOUNTER — Other Ambulatory Visit: Payer: Self-pay

## 2023-10-08 NOTE — Progress Notes (Signed)
 Specialty Pharmacy Initial Fill Coordination Note  [name] is a 63 y.o. female contacted today regarding initial fill of specialty medication(s) Mepolizumab [name])   Patient requested Courier to Provider Office   Delivery date: 10/13/23   Verified address: [address]  [place] [address]   Medication will be filled on 10/12/2023.   Patient is aware of $12.15 copayment.  \

## 2023-10-08 NOTE — Progress Notes (Signed)
 Specialty Pharmacy Initiation Note   [name] is a 63 y.o. female who will be followed by the specialty pharmacy service for RxSp Asthma/COPD    Review of administration, indication, effectiveness, safety, potential side effects, storage/disposable, and missed dose instructions occurred today for patient's specialty medication(s) Mepolizumab (Nucala)     Patient/Caregiver did not have any additional questions or concerns.   Patient's therapy is appropriate to: Initiate    Goals Addressed             This Visit's Progress    Minimize recurrence of flares       Patient is unable to be assessed as therapy was recently initiated. Patient will be evaluated at upcoming provider appointment to assess progress         Bobette Mo Specialty Pharmacist

## 2023-10-09 ENCOUNTER — Other Ambulatory Visit: Payer: Self-pay

## 2023-10-14 NOTE — Telephone Encounter (Signed)
 L/m for patient Nucala delivered and can call and make appt to start therapy

## 2023-10-27 ENCOUNTER — Other Ambulatory Visit: Payer: Self-pay

## 2023-11-02 ENCOUNTER — Encounter: Payer: Self-pay | Admitting: Allergy and Immunology

## 2023-11-02 ENCOUNTER — Ambulatory Visit

## 2023-11-02 ENCOUNTER — Ambulatory Visit: Admitting: Allergy and Immunology

## 2023-11-02 VITALS — BP 136/72 | HR 94 | Resp 16

## 2023-11-02 DIAGNOSIS — J4550 Severe persistent asthma, uncomplicated: Secondary | ICD-10-CM | POA: Diagnosis not present

## 2023-11-02 DIAGNOSIS — J3089 Other allergic rhinitis: Secondary | ICD-10-CM | POA: Diagnosis not present

## 2023-11-02 DIAGNOSIS — K219 Gastro-esophageal reflux disease without esophagitis: Secondary | ICD-10-CM | POA: Diagnosis not present

## 2023-11-02 DIAGNOSIS — J4489 Other specified chronic obstructive pulmonary disease: Secondary | ICD-10-CM | POA: Diagnosis not present

## 2023-11-02 DIAGNOSIS — F17210 Nicotine dependence, cigarettes, uncomplicated: Secondary | ICD-10-CM

## 2023-11-02 DIAGNOSIS — D7219 Other eosinophilia: Secondary | ICD-10-CM

## 2023-11-02 MED ORDER — MEPOLIZUMAB 100 MG/ML ~~LOC~~ SOSY
100.0000 mg | PREFILLED_SYRINGE | SUBCUTANEOUS | Status: AC
  Administered 2023-11-02 – 2024-05-03 (×7): 100 mg via SUBCUTANEOUS

## 2023-11-02 NOTE — Progress Notes (Unsigned)
 Started Nucala 100 mg/ml today. Will continue every 4 weeks. Consent signed, no issues after observed wait time.

## 2023-11-02 NOTE — Patient Instructions (Addendum)
  1. Treat and prevent inflammation of airway:   A. Trelegy 200 - 1 inhalation 1 time per day  B. Mepolizumab injections every month with first dose today  C. Nasonex - 1 spray each nostril 2 times per day  D. Use nicotine substitutes to replace smoke exposure  2. Treat and prevent reflux induced inflammation of airway:   A. Decrease caffeine consumption  B. Pantoprazole 40 mg - 1 tablet 2 times per day  C. Famotidine 40 mg - 1 tablet 1 time per day  D. Replace throat clearing with swallowing / drinking maneuver  3. If needed:   A. Albuterol - 2 inhalations every 4-6 hours  B. Albuterol nebulization - every 4-6 hours  4. DO NOT USE SYMBICORT  5. Return to clinic in Summer 2025 or earlier if needed  6. Influenza = tamiflu. Covid = Paxlovid

## 2023-11-02 NOTE — Progress Notes (Unsigned)
 [place] - [place] - [place] - [place] - [place]   Follow-up Note  Referring Provider: Dorene Grebe* Primary Provider: Alroy Bailiff Date of Office Visit: 11/02/2023  Subjective:   [name] (DOB: 06-12-1961) is a 63 y.o. female [facility] in re-evaluation of the following:  HPI: Jasmeen returns to this clinic in evaluation of asthma, COPD, allergic rhinitis, LPR, dry mouth, eosinophilia, tobacco smoking.  I last saw her in this clinic 05 October 2023.  She has been using her Trelegy every day and she is also been using Symbicort sometimes up to 4 times per day as a rescue medicine.  She still has wheezing and coughing and shortness of breath on a pretty regular basis.  She does cough at nighttime.  Currently she is using 4 L nasal cannula oxygen.  Apparently she has sleep apnea which is being untreated as she cannot tolerate her CPAP machine.  She apparently had an episode of sinusitis that was treated at the urgent care center with doxycycline successfully.  She still has lots of nasal stuffiness on a regular basis.  She still continues with a very dry mouth.  She was going to determine if she needed oxybutynin and she made the determination that she does need this agent otherwise she would urinate on herself all day.  Her reflux is going pretty well on her current therapy.  She still drinks about 5 caffeinated drinks per day.  She still continues to smoke.  Allergies as of 11/02/2023       Reactions   Tizanidine Other (See Comments)   May 2022- Reported hallucinations vs dreams w/ sleep walking at home when tizanidine added post-op   Amoxicillin    Yeast infection ; tolerated Cefepime        Medication List    albuterol 108 (90 Base) MCG/ACT inhaler Commonly known as: VENTOLIN HFA INHALE 2 PUFFS INTO THE LUNGS EVERY 6 HOURS AS NEEDED FOR WHEEZING OR SHORTNESS OF BREATH   albuterol (2.5  MG/3ML) 0.083% nebulizer solution Commonly known as: PROVENTIL Take by nebulization.   amLODipine 5 MG tablet Commonly known as: NORVASC Take 5 mg by mouth daily.   aspirin EC 81 MG tablet Take 1 tablet (81 mg total) by mouth 2 (two) times daily.   budesonide-formoterol 160-4.5 MCG/ACT inhaler Commonly known as: Symbicort Inhale 2 puffs into the lungs 2 (two) times daily.   busPIRone 10 MG tablet Commonly known as: BUSPAR Take 1 tablet (10 mg total) by mouth 3 (three) times daily.   famotidine 40 MG tablet Commonly known as: PEPCID Take 1 tablet (40 mg total) by mouth daily.   folic acid 1 MG tablet Commonly known as: FOLVITE TAKE 1 TABLET(1 MG) BY MOUTH DAILY   gabapentin 300 MG capsule Commonly known as: NEURONTIN TAKE 1 CAPSULE(300 MG) BY MOUTH THREE TIMES DAILY   hydrOXYzine 50 MG tablet Commonly known as: ATARAX TAKE 1 TABLET (50 MG TOTAL) BY MOUTH 3 (THREE) TIMES DAILY AS NEEDED FOR ANXIETY.   ibuprofen 200 MG tablet Commonly known as: ADVIL Take 600 mg by mouth daily as needed for moderate pain.   losartan 100 MG tablet Commonly known as: COZAAR TAKE 1 TABLET BY MOUTH EVERY DAY   mometasone 50 MCG/ACT nasal spray Commonly known as: Nasonex Place 1 spray into the nose in the morning and at bedtime.   montelukast 10 MG tablet Commonly known as: SINGULAIR Take 1 tablet by mouth  at bedtime.   multivitamin with minerals Tabs tablet Take 1 tablet by mouth daily.   Nucala 100 MG/ML Sosy Generic drug: mepolizumab Inject 100 mg into the skin every 28 (twenty-eight) days.   oxybutynin 10 MG 24 hr tablet Commonly known as: DITROPAN-XL Take 10 mg by mouth daily.   OXYGEN Inhale 4 L into the lungs at bedtime.   pantoprazole 40 MG tablet Commonly known as: PROTONIX TAKE 1 TABLET(40 MG) BY MOUTH TWICE DAILY   QUEtiapine 300 MG tablet Commonly known as: SEROQUEL TAKE 1 TABLET(300 MG) BY MOUTH AT BEDTIME   sertraline 100 MG tablet Commonly known as:  ZOLOFT Take 2 tablets (200 mg total) by mouth daily.   Trelegy Ellipta 200-62.5-25 MCG/ACT Aepb Generic drug: Fluticasone-Umeclidin-Vilant Inhale 1 puff into the lungs daily.   Unisom SleepMelts 25 MG Tbdp Generic drug: diphenhydrAMINE HCl (Sleep) Take 1 tablet (25 mg total) by mouth at bedtime as needed.    Past Medical History:  Diagnosis Date  . Anxiety   . Arthritis   . COPD (chronic obstructive pulmonary disease) (HCC)    uses oxygen 4L at nite  . Depression   . Dyspnea   . GERD (gastroesophageal reflux disease)   . Hypertension   . Peripheral edema    2+ at PAT visit    Past Surgical History:  Procedure Laterality Date  . ABDOMINAL HYSTERECTOMY  1986  . CARPAL TUNNEL RELEASE Bilateral 1980   Per patient  . SHOULDER SURGERY Left 1980  . 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   Procedure: RIGHT TOTAL HIP ARTHROPLASTY ANTERIOR APPROACH;  Surgeon: Gean Birchwood, MD;  Location: [facility];  Service: Orthopedics;  Laterality: Right;  . TOTAL HIP ARTHROPLASTY Left 11/11/2021   Procedure: LEFT TOTAL HIP ARTHROPLASTY ANTERIOR APPROACH;  Surgeon: Gean Birchwood, MD;  Location: [facility];  Service: Orthopedics;  Laterality: Left;    Review of systems negative except as noted in HPI / PMHx or noted below:  Review of Systems  Constitutional: Negative.   HENT: Negative.    Eyes: Negative.   Respiratory: Negative.    Cardiovascular: Negative.   Gastrointestinal: Negative.   Genitourinary: Negative.   Musculoskeletal: Negative.   Skin: Negative.   Neurological: Negative.   Endo/Heme/Allergies: Negative.   Psychiatric/Behavioral: Negative.       Objective:   Vitals:   11/02/23 0943  BP: 136/72  Pulse: 94  Resp: 16  SpO2: 96%          Physical Exam Constitutional:      Appearance: She is not diaphoretic.  HENT:     Head: Normocephalic.     Right Ear: Tympanic membrane, ear canal and external ear normal.     Left Ear: Tympanic membrane, ear canal and external ear  normal.     Nose: Nose normal. No mucosal edema or rhinorrhea.     Mouth/Throat:     Pharynx: Uvula midline. No oropharyngeal exudate.  Eyes:     Conjunctiva/sclera: Conjunctivae normal.  Neck:     Thyroid: No thyromegaly.     Trachea: Trachea normal. No tracheal tenderness or tracheal deviation.  Cardiovascular:     Rate and Rhythm: Normal rate and regular rhythm.     Heart sounds: Normal heart sounds, S1 normal and S2 normal. No murmur heard. Pulmonary:     Effort: No respiratory distress.     Breath sounds: Normal breath sounds. No stridor. No wheezing or rales.  Lymphadenopathy:     Head:     Right side of  head: No tonsillar adenopathy.     Left side of head: No tonsillar adenopathy.     Cervical: No cervical adenopathy.  Skin:    Findings: No erythema or rash.     Nails: There is no clubbing.  Neurological:     Mental Status: She is alert.    Diagnostics: Spirometry was performed and demonstrated an FEV1 of 0.81 at 35 % of predicted.  Assessment and Plan:   1. Not well controlled severe persistent asthma   2. COPD with asthma (HCC)   3. Perennial allergic rhinitis   4. LPRD (laryngopharyngeal reflux disease)   5. Other eosinophilia   6. Heavy tobacco smoker >10 cigarettes per day     1. Treat and prevent inflammation of airway:   A. Trelegy 200 - 1 inhalation 1 time per day  B. Mepolizumab injections every month with first dose today  C. Nasonex - 1 spray each nostril 2 times per day  D. Use nicotine substitutes to replace smoke exposure  2. Treat and prevent reflux induced inflammation of airway:   A. Decrease caffeine consumption  B. Pantoprazole 40 mg - 1 tablet 2 times per day  C. Famotidine 40 mg - 1 tablet 1 time per day  D. Replace throat clearing with swallowing / drinking maneuver  3. If needed:   A. Albuterol - 2 inhalations every 4-6 hours  B. Albuterol nebulization - every 4-6 hours  4. DO NOT USE SYMBICORT  5. Return to clinic in Summer  2025 or earlier if needed  6. Influenza = tamiflu. Covid = Paxlovid  We will be starting Harriett Sine on Mepolizumab injections today to address her eosinophilic inflammation while she continues on a collection of anti-inflammatory agents as noted above.  Of course she should stop smoking we had a discussion about that issue today.  Her reflux appears to be under good control with her current plan.  I have asked her not to use Symbicort as a rescue medicine in conjunction with trilogy as her controller agent.  I will see her back in this clinic in the summer 2025 or earlier if there is a problem.  Laurette Schimke, MD Allergy / Immunology [facility]

## 2023-11-03 ENCOUNTER — Other Ambulatory Visit: Payer: Self-pay

## 2023-11-03 ENCOUNTER — Encounter: Payer: Self-pay | Admitting: Allergy and Immunology

## 2023-11-03 MED ORDER — TRELEGY ELLIPTA 200-62.5-25 MCG/ACT IN AEPB: 1.0000 | INHALATION_SPRAY | Freq: Every day | RESPIRATORY_TRACT | 1 refills | Status: DC

## 2023-11-03 MED ORDER — FAMOTIDINE 40 MG PO TABS
40.0000 mg | ORAL_TABLET | Freq: Every day | ORAL | 1 refills | Status: DC
Start: 2023-11-03 — End: 2024-04-25

## 2023-11-03 MED ORDER — PANTOPRAZOLE SODIUM 40 MG PO TBEC: 40.0000 mg | DELAYED_RELEASE_TABLET | Freq: Two times a day (BID) | ORAL | 1 refills | Status: DC

## 2023-11-03 MED ORDER — MOMETASONE FUROATE 50 MCG/ACT NA SUSP: 1.0000 | Freq: Two times a day (BID) | NASAL | 1 refills | Status: DC

## 2023-11-09 ENCOUNTER — Telehealth: Payer: Self-pay

## 2023-11-09 NOTE — Telephone Encounter (Signed)
*  Asthma/Allergy  Pharmacy Patient Advocate Encounter  Received notification from [facility] that Prior Authorization for Trelegy Ellipta 200-62.5-25MCG/ACT aerosol powder  has been APPROVED from 11/09/2023 to 08/03/2024   PA #/Case ID/Reference #: [id number]

## 2023-11-19 ENCOUNTER — Other Ambulatory Visit (HOSPITAL_COMMUNITY): Payer: Self-pay

## 2023-11-19 ENCOUNTER — Other Ambulatory Visit: Payer: Self-pay

## 2023-11-19 NOTE — Progress Notes (Signed)
 Specialty Pharmacy Refill Coordination Note  Nashay Brickley is a 63 y.o. female contacted today regarding refills of specialty medication(s) Mepolizumab Andris Keels)   Patient requested Courier to Provider Office   Delivery date: 11/24/23   Verified address: [facility] [address]  [place],  [place] [address]   Medication will be filled on 11/23/23.

## 2023-11-23 ENCOUNTER — Other Ambulatory Visit: Payer: Self-pay

## 2023-11-30 ENCOUNTER — Ambulatory Visit (INDEPENDENT_AMBULATORY_CARE_PROVIDER_SITE_OTHER): Admitting: *Deleted

## 2023-11-30 DIAGNOSIS — J4550 Severe persistent asthma, uncomplicated: Secondary | ICD-10-CM | POA: Diagnosis not present

## 2023-12-06 ENCOUNTER — Other Ambulatory Visit: Payer: Self-pay | Admitting: Allergy and Immunology

## 2023-12-21 ENCOUNTER — Other Ambulatory Visit: Payer: Self-pay

## 2023-12-21 NOTE — Progress Notes (Signed)
 Specialty Pharmacy Refill Coordination Note  [name] is a 63 y.o. female contacted today regarding refills of specialty medication(s) Mepolizumab  (NUCALA )   Patient requested Courier to Provider Office ([facility])   Delivery date: 12/24/23   Verified address: [facility] [address]  [place],   [address]   Medication will be filled on 12/23/23.

## 2023-12-23 ENCOUNTER — Other Ambulatory Visit (HOSPITAL_COMMUNITY): Payer: Self-pay

## 2023-12-23 ENCOUNTER — Other Ambulatory Visit: Payer: Self-pay

## 2023-12-29 ENCOUNTER — Ambulatory Visit

## 2023-12-29 ENCOUNTER — Ambulatory Visit (INDEPENDENT_AMBULATORY_CARE_PROVIDER_SITE_OTHER): Payer: Self-pay

## 2023-12-29 DIAGNOSIS — J4550 Severe persistent asthma, uncomplicated: Secondary | ICD-10-CM | POA: Diagnosis not present

## 2024-01-11 ENCOUNTER — Other Ambulatory Visit: Payer: Self-pay

## 2024-01-11 NOTE — Progress Notes (Signed)
 Specialty Pharmacy Refill Coordination Note  [name] is a 63 y.o. female assessed today regarding refills of clinic administered specialty medication(s) Mepolizumab  (NUCALA )   Clinic requested Courier to Provider Office   Delivery date: 01/19/24   Verified address: [facility] [address]  [place],  [place] [address]   Medication will be filled on 06.16.25.

## 2024-01-20 ENCOUNTER — Encounter: Payer: Self-pay | Admitting: Allergy and Immunology

## 2024-01-20 ENCOUNTER — Ambulatory Visit (INDEPENDENT_AMBULATORY_CARE_PROVIDER_SITE_OTHER): Admitting: Allergy and Immunology

## 2024-01-20 VITALS — BP 170/80 | HR 100 | Resp 20 | Ht 62.0 in | Wt 213.0 lb

## 2024-01-20 DIAGNOSIS — J4550 Severe persistent asthma, uncomplicated: Secondary | ICD-10-CM

## 2024-01-20 DIAGNOSIS — J324 Chronic pansinusitis: Secondary | ICD-10-CM

## 2024-01-20 DIAGNOSIS — J3089 Other allergic rhinitis: Secondary | ICD-10-CM

## 2024-01-20 DIAGNOSIS — K219 Gastro-esophageal reflux disease without esophagitis: Secondary | ICD-10-CM

## 2024-01-20 DIAGNOSIS — F17210 Nicotine dependence, cigarettes, uncomplicated: Secondary | ICD-10-CM

## 2024-01-20 DIAGNOSIS — J4489 Other specified chronic obstructive pulmonary disease: Secondary | ICD-10-CM | POA: Diagnosis not present

## 2024-01-20 MED ORDER — AMOXICILLIN-POT CLAVULANATE 875-125 MG PO TABS: ORAL_TABLET | ORAL | 0 refills | Status: AC

## 2024-01-20 MED ORDER — METHYLPREDNISOLONE ACETATE 80 MG/ML IJ SUSP
80.0000 mg | Freq: Once | INTRAMUSCULAR | Status: AC
  Administered 2024-01-20: 80 mg via INTRAMUSCULAR

## 2024-01-20 NOTE — Patient Instructions (Addendum)
  1. Treat and prevent inflammation of airway:   A. Trelegy 200 - 1 inhalation 1 time per day  B. Mepolizumab  injections every month  C. Mometasone   - 1 spray each nostril 2 times per day  D. Use nicotine  substitutes to replace smoke exposure  2. Treat and prevent reflux induced inflammation of airway:   A. Decrease caffeine consumption  B. Pantoprazole  40 mg - 1 tablet 2 times per day  C. Famotidine  40 mg - 1 tablet 1 time per day in evening if needed  D. Replace throat clearing with swallowing / drinking maneuver  3. Treat airway infection:   A. Augmentin  875 - 1 tablet 2 times per day x 10 days  B. Depomedrol 80 mg IM delivered in clinic today  C. Nasal saline a few times per day  4. If needed:   A. Albuterol  - 2 inhalations every 4-6 hours  B. Albuterol  nebulization - every 4-6 hours  5. DO NOT USE SYMBICORT  OR SPIRIVA   6. Return to clinic in 6 months or earlier if needed  7. Influenza = tamiflu. Covid = Paxlovid

## 2024-01-20 NOTE — Progress Notes (Signed)
 [place] - [place] - [place] - [place]   Follow-up Note  Referring Provider: Burnie Calton Shaker* Primary Provider: Burnie Calton Charlies DEVONNA Date of Office Visit: 01/20/2024  Subjective:   [name] (DOB: Sep 11, 1960) is a 63 y.o. female who [facility]24 in re-evaluation of the following:  HPI: [name] returns to this clinic in evaluation of asthma, COPD, allergic rhinitis, LPR, dry mouth, eosinophilia, tobacco smoking.  I last saw her in this clinic 02 November 2023.  She complains of having sinus.  Sinus means that she has headache and ear fullness and runny nose and coughing.  She has already received 1 antibiotic for this issue sometime in May 2025 and she got a little bit better but she relapsed over the course of the past 2 weeks or so.  She cannot smell at all.  She is not using any nasal steroid.  She continues to have issues with wheezing and coughing and shortness of breath and this occurs even though she has been using her Mepolizumab  and Trelegy on a consistent basis.  Her requirement for short acting bronchodilator can be up to several times per day.  She occasionally adds Spiriva .  She is fortunately not using her Symbicort  while she uses her other triple inhaler.  She believes that her reflux is under very good control at this point in time while using pantoprazole  twice a day and rarely famotidine  at night.  She continues to smoke over 1 pack of cigarettes per day.  She has decreased her oxybutynin  by about 50% and this has helped her dry mouth.  Allergies as of 01/20/2024       Reactions   Tizanidine  Other (See Comments)   May 2022- Reported hallucinations vs dreams w/ sleep walking at home when tizanidine  added post-op   Amoxicillin     Yeast infection ; tolerated Cefepime         Medication List    albuterol  108 (90 Base) MCG/ACT inhaler Commonly known as: VENTOLIN  HFA INHALE 2 PUFFS  INTO THE LUNGS EVERY 6 HOURS AS NEEDED FOR WHEEZING OR SHORTNESS OF BREATH   albuterol  (2.5 MG/3ML) 0.083% nebulizer solution Commonly known as: PROVENTIL  Take by nebulization.   alendronate 70 MG tablet Commonly known as: FOSAMAX Take 70 mg by mouth once a week.   amLODipine  10 MG tablet Commonly known as: NORVASC  Take 10 mg by mouth daily.   aspirin  EC 81 MG tablet Take 1 tablet (81 mg total) by mouth 2 (two) times daily.   busPIRone  10 MG tablet Commonly known as: BUSPAR  Take 1 tablet (10 mg total) by mouth 3 (three) times daily.   CALCIUM  PO Take by mouth daily.   famotidine  40 MG tablet Commonly known as: PEPCID  Take 1 tablet (40 mg total) by mouth daily.   folic acid  1 MG tablet Commonly known as: FOLVITE  TAKE 1 TABLET(1 MG) BY MOUTH DAILY   gabapentin  300 MG capsule Commonly known as: NEURONTIN  TAKE 1 CAPSULE(300 MG) BY MOUTH THREE TIMES DAILY   hydrOXYzine  50 MG tablet Commonly known as: ATARAX  TAKE 1 TABLET (50 MG TOTAL) BY MOUTH 3 (THREE) TIMES DAILY AS NEEDED FOR ANXIETY.   ibuprofen  200 MG tablet Commonly known as: ADVIL  Take 600 mg by mouth daily as needed for moderate pain.   losartan  100 MG tablet Commonly known as: COZAAR  TAKE 1 TABLET BY MOUTH EVERY DAY   magnesium  oxide 400 (240 Mg) MG tablet Commonly known as: MAG-OX Take  1 tablet by mouth 2 (two) times daily.   mometasone  50 MCG/ACT nasal spray Commonly known as: Nasonex  Place 1 spray into the nose in the morning and at bedtime.   multivitamin with minerals Tabs tablet Take 1 tablet by mouth daily.   Nucala  100 MG/ML Sosy Generic drug: mepolizumab  Inject 100 mg into the skin every 28 (twenty-eight) days.   oxybutynin  10 MG 24 hr tablet Commonly known as: DITROPAN -XL Take 10 mg by mouth daily.   OXYGEN  Inhale 4 L into the lungs at bedtime.   pantoprazole  40 MG tablet Commonly known as: PROTONIX  Take 1 tablet (40 mg total) by mouth 2 (two) times daily.   PROBIOTIC PO Take by  mouth daily.   QUEtiapine  400 MG tablet Commonly known as: SEROQUEL  Take 400 mg by mouth at bedtime.   sertraline  100 MG tablet Commonly known as: ZOLOFT  Take 2 tablets (200 mg total) by mouth daily.   Spiriva  Respimat 2.5 MCG/ACT Aers Generic drug: Tiotropium Bromide Monohydrate  INHALE 2 PUFFS EVERY DAY   Trelegy Ellipta  200-62.5-25 MCG/ACT Aepb Generic drug: Fluticasone -Umeclidin-Vilant Inhale 1 puff into the lungs daily.   VITAMIN D-3 PO Take by mouth daily.    Past Medical History:  Diagnosis Date   Anxiety    Arthritis    COPD (chronic obstructive pulmonary disease) (HCC)    uses oxygen  4L at nite   Depression    Dyspnea    GERD (gastroesophageal reflux disease)    Hypertension    Peripheral edema    2+ at PAT visit    Past Surgical History:  Procedure Laterality Date   ABDOMINAL HYSTERECTOMY  1986   CARPAL TUNNEL RELEASE Bilateral 1980   Per patient   SHOULDER SURGERY Left 1980   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   Procedure: RIGHT TOTAL HIP ARTHROPLASTY ANTERIOR APPROACH;  Surgeon: Liam Lerner, MD;  Location: [facility];  Service: Orthopedics;  Laterality: Right;   TOTAL HIP ARTHROPLASTY Left 11/11/2021   Procedure: LEFT TOTAL HIP ARTHROPLASTY ANTERIOR APPROACH;  Surgeon: Liam Lerner, MD;  Location: [facility];  Service: Orthopedics;  Laterality: Left;    Review of systems negative except as noted in HPI / PMHx or noted below:  Review of Systems  Constitutional: Negative.   HENT: Negative.    Eyes: Negative.   Respiratory: Negative.    Cardiovascular: Negative.   Gastrointestinal: Negative.   Genitourinary: Negative.   Musculoskeletal: Negative.   Skin: Negative.   Neurological: Negative.   Endo/Heme/Allergies: Negative.   Psychiatric/Behavioral: Negative.       Objective:   Vitals:   01/20/24 1550 01/20/24 1630  BP: (!) 178/90 (!) 170/80  Pulse: 100   Resp: 20   SpO2: 94%    Height: 5' 2 (157.5 cm)  Weight: 213 lb (96.6 kg)    Physical Exam Constitutional:      Appearance: She is not diaphoretic.  HENT:     Head: Normocephalic.     Right Ear: Tympanic membrane, ear canal and external ear normal.     Left Ear: Tympanic membrane, ear canal and external ear normal.     Nose: Nose normal. No mucosal edema or rhinorrhea.     Mouth/Throat:     Pharynx: Uvula midline. No oropharyngeal exudate.   Eyes:     Conjunctiva/sclera: Conjunctivae normal.   Neck:     Thyroid: No thyromegaly.     Trachea: Trachea normal. No tracheal tenderness or tracheal deviation.   Cardiovascular:     Rate and Rhythm: Normal  rate and regular rhythm.     Heart sounds: Normal heart sounds, S1 normal and S2 normal. No murmur heard. Pulmonary:     Effort: No respiratory distress.     Breath sounds: No stridor. Wheezing (Bilateral expiratory wheezing all lung fields) present. No rales.  Lymphadenopathy:     Head:     Right side of head: No tonsillar adenopathy.     Left side of head: No tonsillar adenopathy.     Cervical: No cervical adenopathy.   Skin:    Findings: No erythema or rash.     Nails: There is no clubbing.   Neurological:     Mental Status: She is alert.     Diagnostics: none  Assessment and Plan:   1. Not well controlled severe persistent asthma   2. COPD with asthma (HCC)   3. Perennial allergic rhinitis   4. LPRD (laryngopharyngeal reflux disease)   5. Heavy tobacco smoker >10 cigarettes per day   6. Chronic pansinusitis    1. Treat and prevent inflammation of airway:   A. Trelegy 200 - 1 inhalation 1 time per day  B. Mepolizumab  injections every month  C. Mometasone   - 1 spray each nostril 2 times per day  D. Use nicotine  substitutes to replace smoke exposure  2. Treat and prevent reflux induced inflammation of airway:   A. Decrease caffeine consumption  B. Pantoprazole  40 mg - 1 tablet 2 times per day  C. Famotidine  40 mg - 1 tablet 1 time per day in evening if needed  D. Replace throat  clearing with swallowing / drinking maneuver  3. Treat airway infection / inflammation:   A. Augmentin  875 - 1 tablet 2 times per day x 10 days  B. Depomedrol 80 mg IM delivered in clinic today  C. Nasal saline a few times per day  4. If needed:   A. Albuterol  - 2 inhalations every 4-6 hours  B. Albuterol  nebulization - every 4-6 hours  5. DO NOT USE SYMBICORT  OR SPIRIVA   6. Return to clinic in 6 months or earlier if needed  7. Influenza = tamiflu. Covid = Paxlovid  Shakeeta will utilize medications directed against respiratory tract inflammation and infection with a systemic steroid and a broad-spectrum antibiotic.  She will remain on anti-inflammatory agents for her airway including use of a triple inhaler and Mepolizumab  injections and I have encouraged her to consistently use her nasal mometasone .  She obviously needs to stop smoking or her airway is never going to get healthy and I once again had a talk with her today about that issue.  Fortunately her reflux is under good control with her current plan.  I will see her back in this clinic in 6 months or earlier if there is a problem.  Camellia Denis, MD Allergy  / Immunology [facility]

## 2024-01-21 ENCOUNTER — Encounter: Payer: Self-pay | Admitting: Allergy and Immunology

## 2024-01-26 ENCOUNTER — Ambulatory Visit (INDEPENDENT_AMBULATORY_CARE_PROVIDER_SITE_OTHER)

## 2024-01-26 DIAGNOSIS — J4550 Severe persistent asthma, uncomplicated: Secondary | ICD-10-CM | POA: Diagnosis not present

## 2024-02-15 ENCOUNTER — Telehealth: Payer: Self-pay | Admitting: *Deleted

## 2024-02-15 NOTE — Telephone Encounter (Signed)
 [name] called to report that she is still having sinus issues. She said she has a headache, sore throat, sinus congestion, and is blowing out yellow mucus. She said she got slightly better after the antibiotic and steroid but pretty soon after finishing it, her symptoms got worse. Please advise. She can come in for an appointment if needed if we can't call something in for her.

## 2024-02-16 ENCOUNTER — Other Ambulatory Visit: Payer: Self-pay | Admitting: *Deleted

## 2024-02-16 DIAGNOSIS — J3089 Other allergic rhinitis: Secondary | ICD-10-CM

## 2024-02-16 NOTE — Telephone Encounter (Signed)
 Prior authorization has been approved for the sinus CT scan. Human authorization # [id number] exp: 04/16/2024. Appointment scheduled at [facility] for Thursday, July 17th at 3:00 arriving at 2:30 for check in. Patient informed and order sent.

## 2024-02-16 NOTE — Telephone Encounter (Signed)
 Zniya has been informed. The [facility] req is up front for her to pick up. I will check with insurance regarding the CT and schedule that.

## 2024-02-17 ENCOUNTER — Other Ambulatory Visit: Payer: Self-pay

## 2024-02-17 ENCOUNTER — Other Ambulatory Visit: Payer: Self-pay | Admitting: Pharmacy Technician

## 2024-02-17 NOTE — Progress Notes (Signed)
 Specialty Pharmacy Refill Coordination Note  [name] is a 63 y.o. female contacted today regarding refills of specialty medication(s) Mepolizumab  (NUCALA )   Patient requested Courier to Provider Office   Delivery date: 02/18/24   Verified address: [facility] [address] [place], [place] [address]   Medication will be filled on 02/17/24.

## 2024-02-18 ENCOUNTER — Other Ambulatory Visit: Payer: Self-pay | Admitting: Family Medicine

## 2024-02-23 ENCOUNTER — Ambulatory Visit (INDEPENDENT_AMBULATORY_CARE_PROVIDER_SITE_OTHER)

## 2024-02-23 DIAGNOSIS — J4550 Severe persistent asthma, uncomplicated: Secondary | ICD-10-CM

## 2024-02-25 ENCOUNTER — Other Ambulatory Visit: Payer: Self-pay | Admitting: *Deleted

## 2024-02-25 ENCOUNTER — Encounter: Payer: Self-pay | Admitting: *Deleted

## 2024-02-25 MED ORDER — MUPIROCIN 2 % EX OINT: TOPICAL_OINTMENT | CUTANEOUS | 0 refills | Status: AC

## 2024-02-25 NOTE — Telephone Encounter (Signed)
 Informed [name] that her CT was normal. She said her symptoms are no better and she wants to know what else she can do. Please advise.

## 2024-02-25 NOTE — Telephone Encounter (Signed)
 error

## 2024-02-27 LAB — ALLERGENS W/TOTAL IGE AREA 2

## 2024-02-29 ENCOUNTER — Ambulatory Visit: Payer: Self-pay | Admitting: Allergy and Immunology

## 2024-02-29 ENCOUNTER — Other Ambulatory Visit: Payer: Self-pay | Admitting: *Deleted

## 2024-02-29 MED ORDER — ALBUTEROL SULFATE HFA 108 (90 BASE) MCG/ACT IN AERS: 2.0000 | INHALATION_SPRAY | Freq: Four times a day (QID) | RESPIRATORY_TRACT | 1 refills | Status: DC | PRN

## 2024-03-09 ENCOUNTER — Other Ambulatory Visit: Payer: Self-pay

## 2024-03-09 NOTE — Progress Notes (Signed)
 Specialty Pharmacy Refill Coordination Note  [name] is a 63 y.o. female contacted today regarding refills of specialty medication(s) Mepolizumab  (NUCALA )   Patient requested Courier to Provider Office   Delivery date: 03/14/24   Injection date: 03/22/24  Verified address: [facility] [address] [place], [place] [address]   Medication will be filled on 03/11/24.

## 2024-03-10 ENCOUNTER — Other Ambulatory Visit: Payer: Self-pay

## 2024-03-16 ENCOUNTER — Other Ambulatory Visit: Payer: Self-pay | Admitting: *Deleted

## 2024-03-16 MED ORDER — TRELEGY ELLIPTA 200-62.5-25 MCG/ACT IN AEPB: 1.0000 | INHALATION_SPRAY | Freq: Every day | RESPIRATORY_TRACT | 1 refills | Status: DC

## 2024-03-16 MED ORDER — ALBUTEROL SULFATE HFA 108 (90 BASE) MCG/ACT IN AERS: 2.0000 | INHALATION_SPRAY | Freq: Four times a day (QID) | RESPIRATORY_TRACT | 1 refills | Status: DC | PRN

## 2024-03-22 ENCOUNTER — Ambulatory Visit (INDEPENDENT_AMBULATORY_CARE_PROVIDER_SITE_OTHER): Admitting: *Deleted

## 2024-03-22 DIAGNOSIS — J4550 Severe persistent asthma, uncomplicated: Secondary | ICD-10-CM | POA: Diagnosis not present

## 2024-03-31 ENCOUNTER — Other Ambulatory Visit: Payer: Self-pay

## 2024-03-31 NOTE — Progress Notes (Signed)
 Specialty Pharmacy Refill Coordination Note  Alainah Phang is a 63 y.o. female assessed today regarding refills of clinic administered specialty medication(s) Mepolizumab  (NUCALA )   Clinic requested Courier to Provider Office   Delivery date: 04/13/24  Injection date: 04/19/24   Verified address: [facility] [address] [place], [place] [address]   Medication will be filled on 04/12/24.

## 2024-04-01 ENCOUNTER — Other Ambulatory Visit: Payer: Self-pay

## 2024-04-01 NOTE — Progress Notes (Signed)
 Specialty Pharmacy Ongoing Clinical Assessment Note  [name] is a 63 y.o. female who is being followed by the specialty pharmacy service for RxSp Asthma/COPD   Patient's specialty medication(s) reviewed today: Mepolizumab  (NUCALA )   Missed doses in the last 4 weeks: 0   Patient/Caregiver did not have any additional questions or concerns.   Therapeutic benefit summary: Patient is achieving benefit   Adverse events/side effects summary: No adverse events/side effects   Patient's therapy is appropriate to: Continue    Goals Addressed             This Visit's Progress    Minimize recurrence of flares   Improving    Patient is not on track and improving. Patient will maintain adherence. Reported has improved sense of smell and using Netti Pot to help with congestion; continues to smoke 1 pack per day. Patient feels as though medication is starting to be helpful for her symptoms but still requires rescue inhaler twice a day.        Follow up: 1 year  Powell CHRISTELLA Gallus Specialty Pharmacist

## 2024-04-12 ENCOUNTER — Other Ambulatory Visit: Payer: Self-pay | Admitting: Allergy and Immunology

## 2024-04-19 ENCOUNTER — Ambulatory Visit

## 2024-04-25 ENCOUNTER — Other Ambulatory Visit: Payer: Self-pay | Admitting: Allergy and Immunology

## 2024-05-01 ENCOUNTER — Other Ambulatory Visit: Payer: Self-pay | Admitting: Allergy and Immunology

## 2024-05-01 DIAGNOSIS — J4550 Severe persistent asthma, uncomplicated: Secondary | ICD-10-CM

## 2024-05-03 ENCOUNTER — Other Ambulatory Visit: Payer: Self-pay

## 2024-05-03 ENCOUNTER — Ambulatory Visit (INDEPENDENT_AMBULATORY_CARE_PROVIDER_SITE_OTHER)

## 2024-05-03 DIAGNOSIS — J4550 Severe persistent asthma, uncomplicated: Secondary | ICD-10-CM

## 2024-05-23 ENCOUNTER — Other Ambulatory Visit: Payer: Self-pay

## 2024-05-23 NOTE — Progress Notes (Signed)
 Specialty Pharmacy Refill Coordination Note  [name] is a 63 y.o. female assessed today regarding refills of clinic administered specialty medication(s) Mepolizumab  (NUCALA )   Clinic requested Courier to Provider Office   Delivery date: 05/26/24   Verified address: [facility] [address] [place], [place] [address]   Medication will be filled on 05/25/24.

## 2024-05-24 ENCOUNTER — Other Ambulatory Visit: Payer: Self-pay

## 2024-05-26 ENCOUNTER — Other Ambulatory Visit: Payer: Self-pay | Admitting: Specialist

## 2024-05-26 ENCOUNTER — Other Ambulatory Visit: Payer: Self-pay | Admitting: Allergy and Immunology

## 2024-05-26 DIAGNOSIS — K7460 Unspecified cirrhosis of liver: Secondary | ICD-10-CM

## 2024-05-31 ENCOUNTER — Ambulatory Visit

## 2024-06-09 ENCOUNTER — Ambulatory Visit
Admission: RE | Admit: 2024-06-09 | Discharge: 2024-06-09 | Disposition: A | Source: Ambulatory Visit | Attending: Specialist | Admitting: Specialist

## 2024-06-09 DIAGNOSIS — K7460 Unspecified cirrhosis of liver: Secondary | ICD-10-CM

## 2024-06-19 ENCOUNTER — Other Ambulatory Visit: Payer: Self-pay | Admitting: Allergy and Immunology

## 2024-06-19 DIAGNOSIS — J4550 Severe persistent asthma, uncomplicated: Secondary | ICD-10-CM

## 2024-06-24 ENCOUNTER — Other Ambulatory Visit: Payer: Self-pay

## 2024-06-24 NOTE — Progress Notes (Signed)
 Patient states she will no longer be receiving Nucala  injections. They have told the MD as well. Patient finds medication ineffective. Dis-enrolling.

## 2024-06-29 ENCOUNTER — Telehealth: Payer: Self-pay | Admitting: Allergy and Immunology

## 2024-06-29 NOTE — Telephone Encounter (Signed)
 Called patient to schedule reapproval appointment. She states she is no longer taking Nucala  and does not wish to continue.

## 2024-07-19 ENCOUNTER — Telehealth: Payer: Self-pay

## 2024-07-19 ENCOUNTER — Other Ambulatory Visit (HOSPITAL_COMMUNITY): Payer: Self-pay

## 2024-07-19 NOTE — Telephone Encounter (Signed)
*  [facility] Patient Advocate Encounter   Received notification from Fax that prior authorization for Trelegy is required/requested.   Insurance verification completed.   The patient is insured through [employer].   Per test claim: Refill too soon. PA is not needed at this time. Medication was filled 05/27/2024. Next eligible fill date is 08/10/2024.

## 2024-07-30 ENCOUNTER — Other Ambulatory Visit: Payer: Self-pay | Admitting: Allergy and Immunology

## 2024-07-30 DIAGNOSIS — J4550 Severe persistent asthma, uncomplicated: Secondary | ICD-10-CM

## 2024-09-09 ENCOUNTER — Other Ambulatory Visit: Payer: Self-pay | Admitting: Allergy and Immunology

## 2024-09-09 DIAGNOSIS — J4550 Severe persistent asthma, uncomplicated: Secondary | ICD-10-CM
